# Patient Record
Sex: Female | Born: 1945 | Race: White | Hispanic: No | Smoking: Former smoker
Health system: Southern US, Community
[De-identification: ages and names within clinical notes are randomized; demographics above are authoritative.]

## PROBLEM LIST (undated history)

## (undated) DIAGNOSIS — C4490 Unspecified malignant neoplasm of skin, unspecified: Secondary | ICD-10-CM

## (undated) DIAGNOSIS — K5790 Diverticulosis of intestine, part unspecified, without perforation or abscess without bleeding: Secondary | ICD-10-CM

## (undated) DIAGNOSIS — R0602 Shortness of breath: Secondary | ICD-10-CM

## (undated) DIAGNOSIS — E039 Hypothyroidism, unspecified: Secondary | ICD-10-CM

## (undated) DIAGNOSIS — M1990 Unspecified osteoarthritis, unspecified site: Secondary | ICD-10-CM

## (undated) DIAGNOSIS — E785 Hyperlipidemia, unspecified: Secondary | ICD-10-CM

## (undated) DIAGNOSIS — F329 Major depressive disorder, single episode, unspecified: Secondary | ICD-10-CM

## (undated) DIAGNOSIS — K219 Gastro-esophageal reflux disease without esophagitis: Secondary | ICD-10-CM

## (undated) DIAGNOSIS — I10 Essential (primary) hypertension: Secondary | ICD-10-CM

## (undated) DIAGNOSIS — K635 Polyp of colon: Secondary | ICD-10-CM

## (undated) DIAGNOSIS — J40 Bronchitis, not specified as acute or chronic: Secondary | ICD-10-CM

## (undated) DIAGNOSIS — F32A Depression, unspecified: Secondary | ICD-10-CM

## (undated) DIAGNOSIS — Z8719 Personal history of other diseases of the digestive system: Secondary | ICD-10-CM

## (undated) DIAGNOSIS — J329 Chronic sinusitis, unspecified: Secondary | ICD-10-CM

## (undated) DIAGNOSIS — N189 Chronic kidney disease, unspecified: Secondary | ICD-10-CM

## (undated) HISTORY — PX: OTHER SURGICAL HISTORY: SHX169

## (undated) HISTORY — PX: HAMMER TOE SURGERY: SHX385

## (undated) HISTORY — PX: CATARACT EXTRACTION, BILATERAL: SHX1313

## (undated) HISTORY — PX: CARPOMETACARPAL (CMC) FUSION OF THUMB: SHX6290

## (undated) HISTORY — PX: HAND SURGERY: SHX662

## (undated) HISTORY — PX: NASAL SINUS SURGERY: SHX719

## (undated) HISTORY — DX: Gastro-esophageal reflux disease without esophagitis: K21.9

## (undated) HISTORY — DX: Hypothyroidism, unspecified: E03.9

## (undated) HISTORY — PX: POLYPECTOMY: SHX149

## (undated) HISTORY — DX: Major depressive disorder, single episode, unspecified: F32.9

## (undated) HISTORY — DX: Hyperlipidemia, unspecified: E78.5

## (undated) HISTORY — DX: Chronic kidney disease, unspecified: N18.9

## (undated) HISTORY — DX: Polyp of colon: K63.5

## (undated) HISTORY — DX: Essential (primary) hypertension: I10

## (undated) HISTORY — PX: CHOLECYSTECTOMY: SHX55

## (undated) HISTORY — DX: Unspecified malignant neoplasm of skin, unspecified: C44.90

## (undated) HISTORY — DX: Diverticulosis of intestine, part unspecified, without perforation or abscess without bleeding: K57.90

## (undated) HISTORY — DX: Bronchitis, not specified as acute or chronic: J40

## (undated) HISTORY — DX: Depression, unspecified: F32.A

## (undated) HISTORY — DX: Unspecified osteoarthritis, unspecified site: M19.90

## (undated) HISTORY — PX: ABDOMINAL HYSTERECTOMY: SHX81

---

## 1998-06-11 ENCOUNTER — Other Ambulatory Visit: Admission: RE | Admit: 1998-06-11 | Discharge: 1998-06-11 | Payer: Self-pay | Admitting: Internal Medicine

## 1998-06-30 ENCOUNTER — Encounter: Payer: Self-pay | Admitting: Cardiology

## 1998-06-30 ENCOUNTER — Ambulatory Visit (HOSPITAL_COMMUNITY): Admission: RE | Admit: 1998-06-30 | Discharge: 1998-06-30 | Payer: Self-pay | Admitting: Cardiology

## 1998-07-22 ENCOUNTER — Encounter: Payer: Self-pay | Admitting: Internal Medicine

## 1998-07-22 ENCOUNTER — Ambulatory Visit (HOSPITAL_COMMUNITY): Admission: RE | Admit: 1998-07-22 | Discharge: 1998-07-22 | Payer: Self-pay | Admitting: Internal Medicine

## 1999-03-13 ENCOUNTER — Ambulatory Visit (HOSPITAL_COMMUNITY): Admission: RE | Admit: 1999-03-13 | Discharge: 1999-03-13 | Payer: Self-pay | Admitting: Internal Medicine

## 1999-07-29 ENCOUNTER — Encounter (INDEPENDENT_AMBULATORY_CARE_PROVIDER_SITE_OTHER): Payer: Self-pay | Admitting: Specialist

## 1999-07-29 ENCOUNTER — Other Ambulatory Visit: Admission: RE | Admit: 1999-07-29 | Discharge: 1999-07-29 | Payer: Self-pay | Admitting: Otolaryngology

## 1999-10-27 ENCOUNTER — Other Ambulatory Visit: Admission: RE | Admit: 1999-10-27 | Discharge: 1999-10-27 | Payer: Self-pay | Admitting: Internal Medicine

## 2001-06-07 ENCOUNTER — Other Ambulatory Visit: Admission: RE | Admit: 2001-06-07 | Discharge: 2001-06-07 | Payer: Self-pay | Admitting: Internal Medicine

## 2005-02-10 ENCOUNTER — Ambulatory Visit: Payer: Self-pay | Admitting: Internal Medicine

## 2005-04-12 ENCOUNTER — Ambulatory Visit: Payer: Self-pay | Admitting: Internal Medicine

## 2006-06-02 ENCOUNTER — Other Ambulatory Visit: Admission: RE | Admit: 2006-06-02 | Discharge: 2006-06-02 | Payer: Self-pay | Admitting: Diagnostic Radiology

## 2006-06-02 ENCOUNTER — Encounter: Admission: RE | Admit: 2006-06-02 | Discharge: 2006-06-02 | Payer: Self-pay | Admitting: Internal Medicine

## 2006-06-02 ENCOUNTER — Encounter (INDEPENDENT_AMBULATORY_CARE_PROVIDER_SITE_OTHER): Payer: Self-pay | Admitting: Specialist

## 2006-06-02 IMAGING — US US BIOPSY
1 series · 5 of 5 positions shown · non-contrast
Comparison: none

CLINICAL DATA: Dominant left thyroid nodule .

[Series 1: unknown · 0.07mm/px · 5 of 5 slices shown]
[im 1/5]
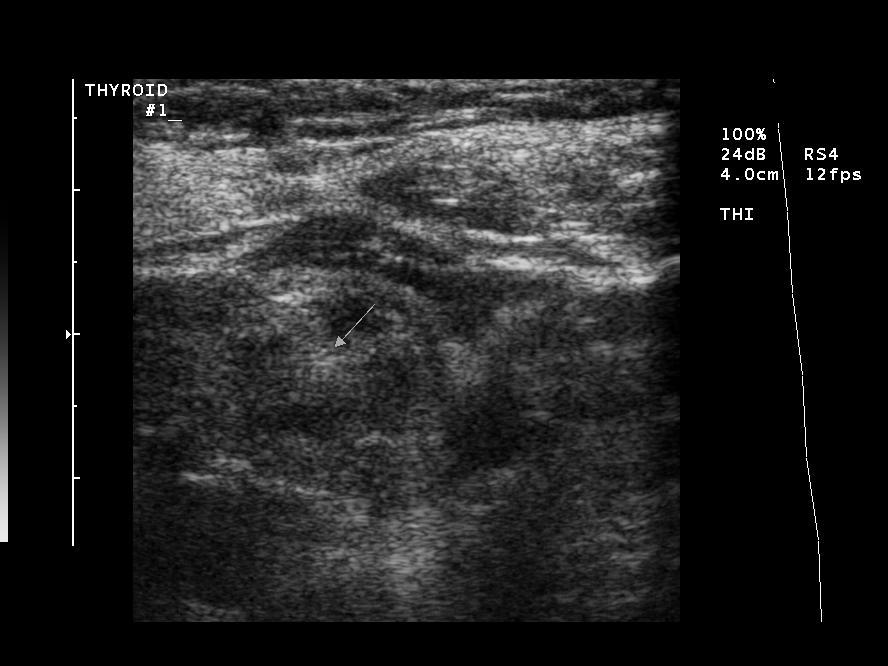
[im 2/5]
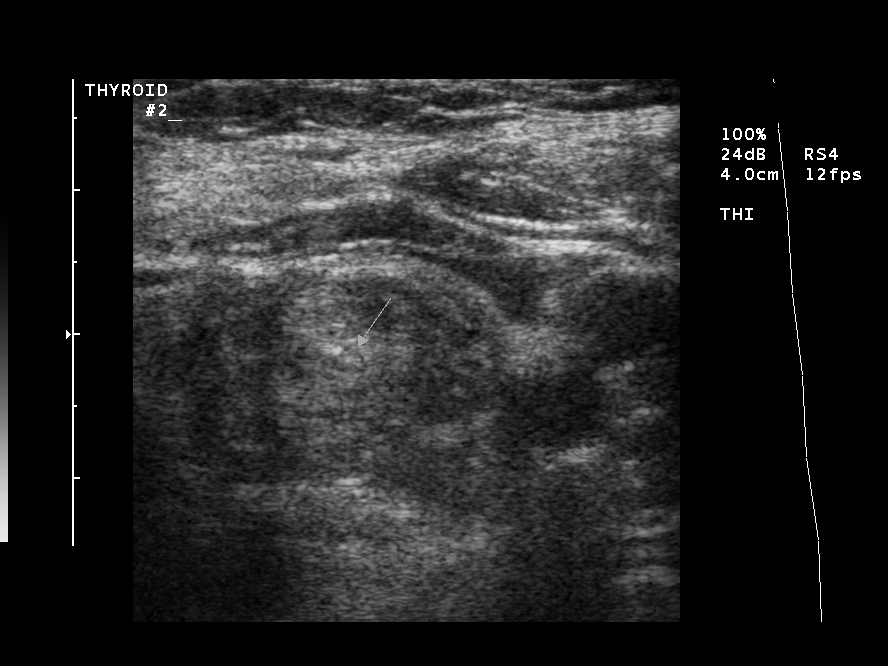
[im 3/5]
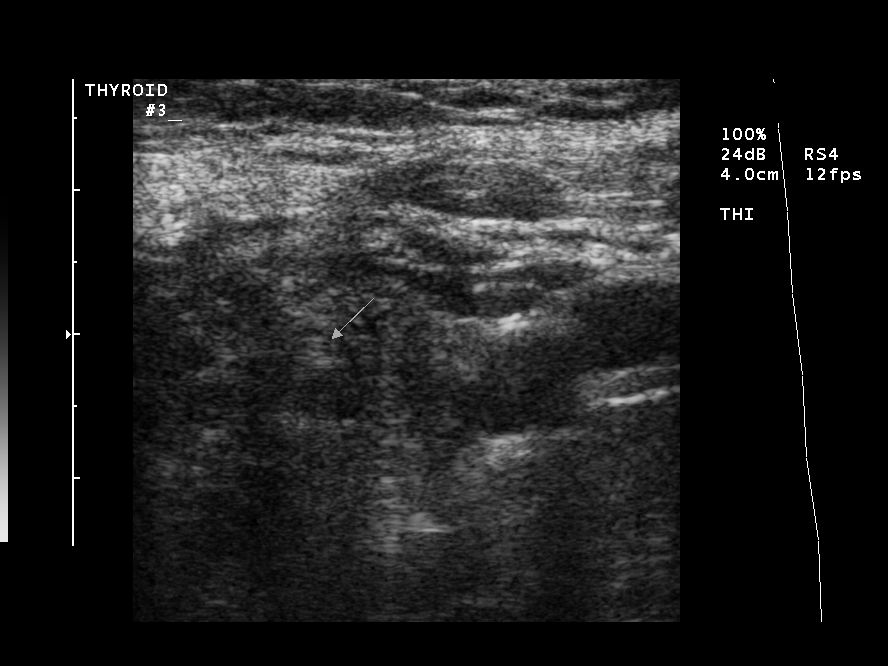
[im 4/5]
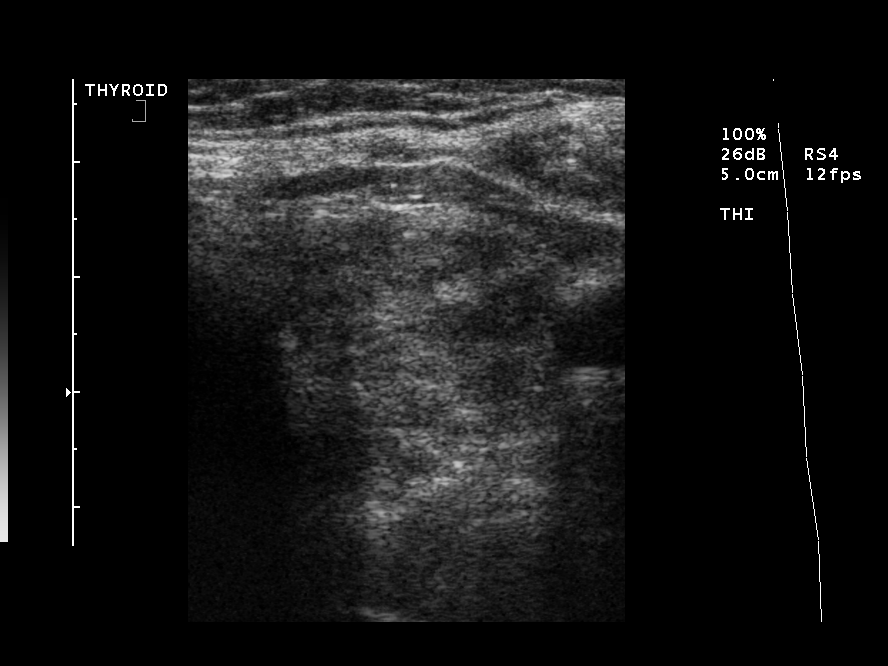
[im 5/5]
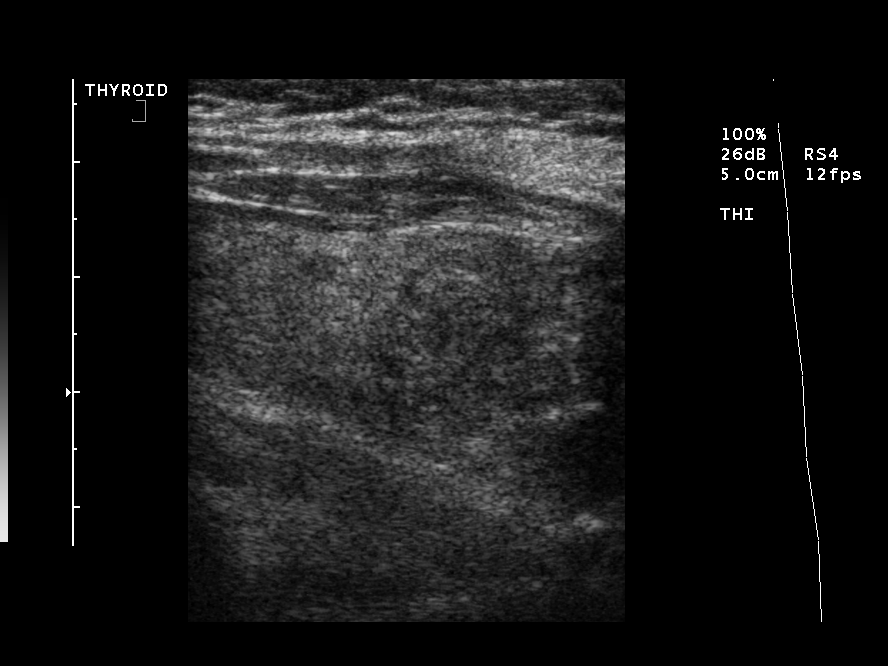

[5 of 5 positions shown; findings below may reference images not displayed]

ULTRASOUND-GUIDED LEFT THYROID BIOPSY:

The findings: Written informed consent was obtained the patient for the
procedure. The patient was placed supine on the ultrasound table. Limited
ultrasound performed to the thyroid to localize the thyroid nodule to be
biopsied. The neck was prepped and draped in sterile fashion and anesthetized
with 1% lidocaine. Using ultrasound guidance, 3 separate 25 gauge fine needle
aspirations were obtained within the dominant left inferior thyroid nodule . The
specimens were given to the technologist for further processing.

The patient tolerated the procedure well.
IMPRESSION: Successful ultrasound-guided biopsy of left thyroid nodule.

## 2007-05-21 ENCOUNTER — Emergency Department (HOSPITAL_COMMUNITY): Admission: EM | Admit: 2007-05-21 | Discharge: 2007-05-22 | Payer: Self-pay | Admitting: Emergency Medicine

## 2007-05-21 IMAGING — CR DG CHEST 1V PORT
1 series · 1 of 1 positions shown · non-contrast
Comparison: None.

CLINICAL DATA: Shortness of breath.  
 PORTABLE CHEST - 1 VIEW:

[view not recorded]
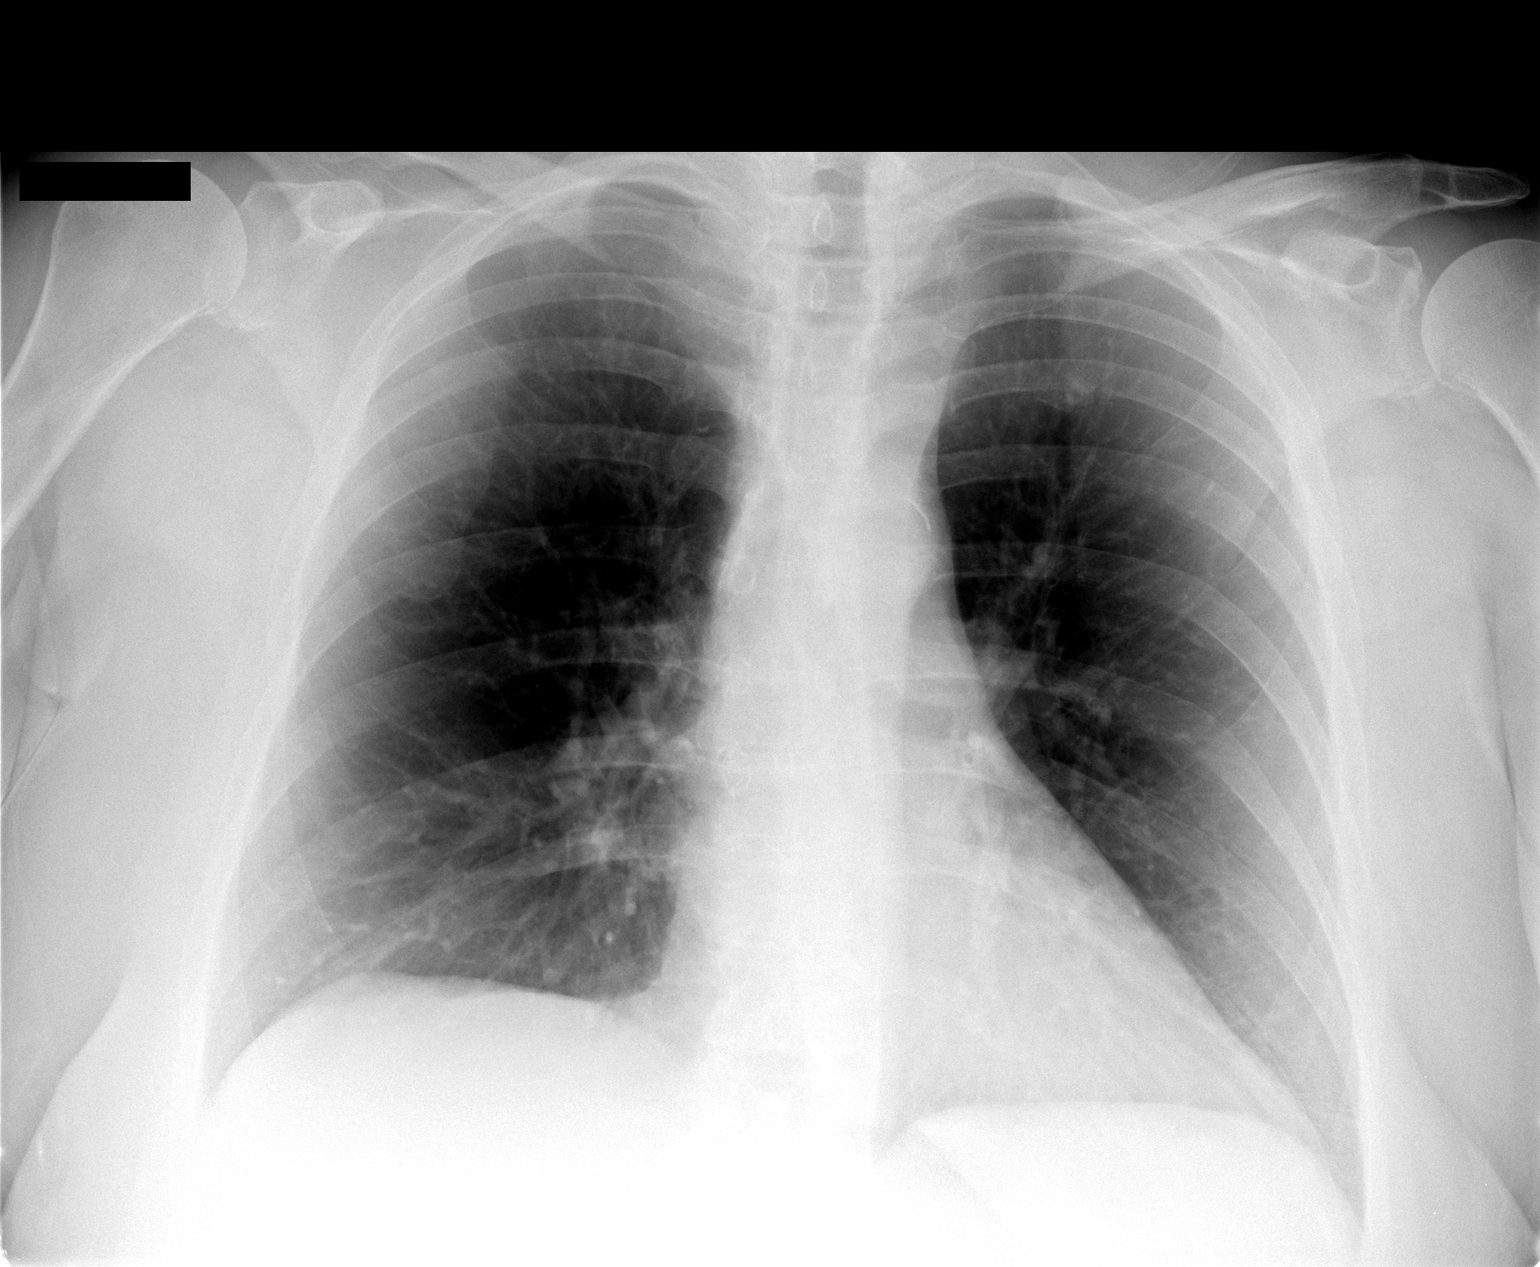

[1 of 1 positions shown; findings below may reference images not displayed]

FINDINGS: Heart size normal.  
 No pleural fluid or pulmonary edema. 
 No air space opacities are identified.
IMPRESSION: No active disease.

## 2007-12-20 ENCOUNTER — Encounter: Payer: Self-pay | Admitting: Gastroenterology

## 2007-12-20 ENCOUNTER — Telehealth: Payer: Self-pay | Admitting: Internal Medicine

## 2007-12-20 ENCOUNTER — Ambulatory Visit: Payer: Self-pay | Admitting: Internal Medicine

## 2008-01-02 ENCOUNTER — Encounter: Payer: Self-pay | Admitting: Internal Medicine

## 2008-01-02 ENCOUNTER — Ambulatory Visit: Payer: Self-pay | Admitting: Internal Medicine

## 2008-01-04 ENCOUNTER — Encounter: Payer: Self-pay | Admitting: Internal Medicine

## 2009-09-23 DIAGNOSIS — K219 Gastro-esophageal reflux disease without esophagitis: Secondary | ICD-10-CM | POA: Insufficient documentation

## 2010-07-23 ENCOUNTER — Ambulatory Visit (HOSPITAL_COMMUNITY)
Admission: RE | Admit: 2010-07-23 | Discharge: 2010-07-23 | Payer: Self-pay | Source: Home / Self Care | Attending: Orthopedic Surgery | Admitting: Orthopedic Surgery

## 2010-07-23 IMAGING — RF DG FLUORO GUIDE NDL PLC/BX
3 series · 3 of 3 positions shown · non-contrast
Comparison: None

CLINICAL DATA: Diagnostic hip injection

FLUORO GUIDED NEEDLE PLACEMENT

[Series 1: run · 1 of 1 slices shown (1 of 3)]
[im 1/1]
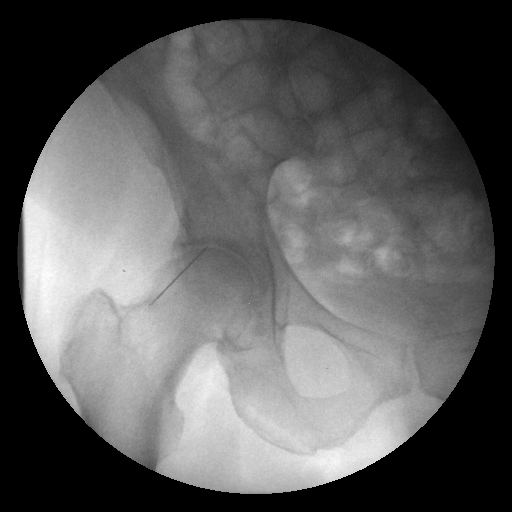

[Series 2: run · 1 of 1 slices shown (2 of 3)]
[im 1/1]
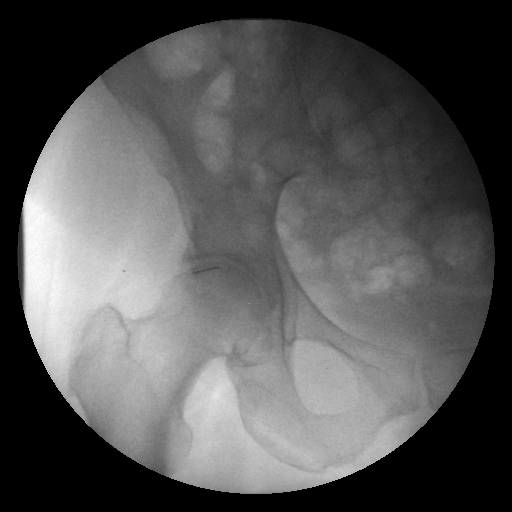

[Series 3: run · 1 of 1 slices shown (3 of 3)]
[im 1/1]
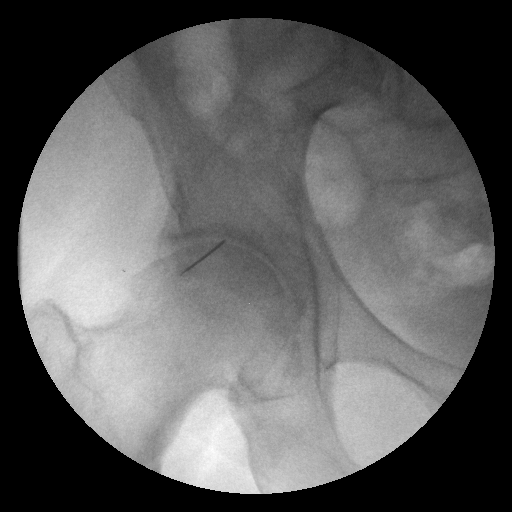

[3 of 3 positions shown; findings below may reference images not displayed]

FINDINGS: Three fluoroscopic spot images demonstrate a needle
positioned over the right hip joint.
IMPRESSION: Fluoroscopic guided right hip joint injection.

## 2010-12-11 ENCOUNTER — Other Ambulatory Visit: Payer: Self-pay | Admitting: Internal Medicine

## 2010-12-11 DIAGNOSIS — E049 Nontoxic goiter, unspecified: Secondary | ICD-10-CM

## 2010-12-15 ENCOUNTER — Other Ambulatory Visit: Payer: Self-pay

## 2010-12-16 ENCOUNTER — Ambulatory Visit
Admission: RE | Admit: 2010-12-16 | Discharge: 2010-12-16 | Disposition: A | Discharge status: Home / Self Care | Payer: Medicare Other | Source: Ambulatory Visit | Attending: Internal Medicine | Admitting: Internal Medicine

## 2010-12-16 DIAGNOSIS — E049 Nontoxic goiter, unspecified: Secondary | ICD-10-CM

## 2010-12-16 IMAGING — US US SOFT TISSUE HEAD/NECK
1 series · 13 of 25 positions shown · non-contrast
Comparison: Dictated report from ultrasound from [REDACTED] dated [DATE]

CLINICAL DATA: Follow up of thyroid goiter, the patient is on
Synthroid, history of prior thyroid biopsy in [85]

THYROID ULTRASOUND
TECHNIQUE: Ultrasound examination of the thyroid gland and adjacent
soft tissues was performed.

[Series 1: us soft tissue head/neck · 0.09mm/px · 13 of 78 slices shown]
[im 1/78]
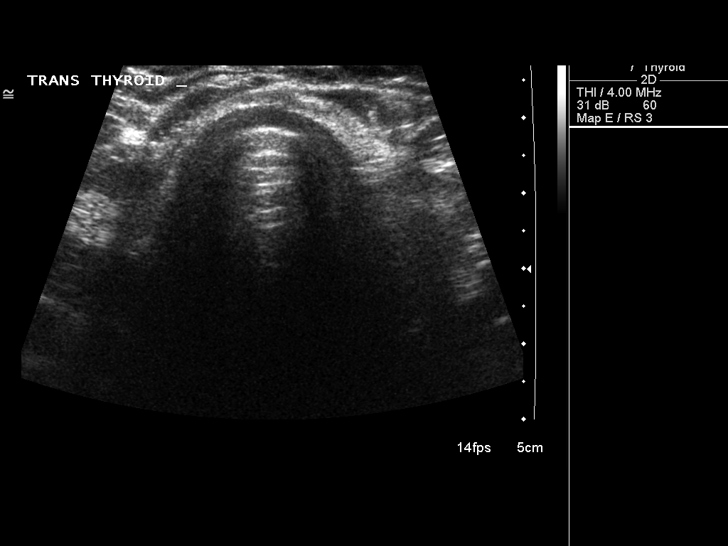
[im 7/78]
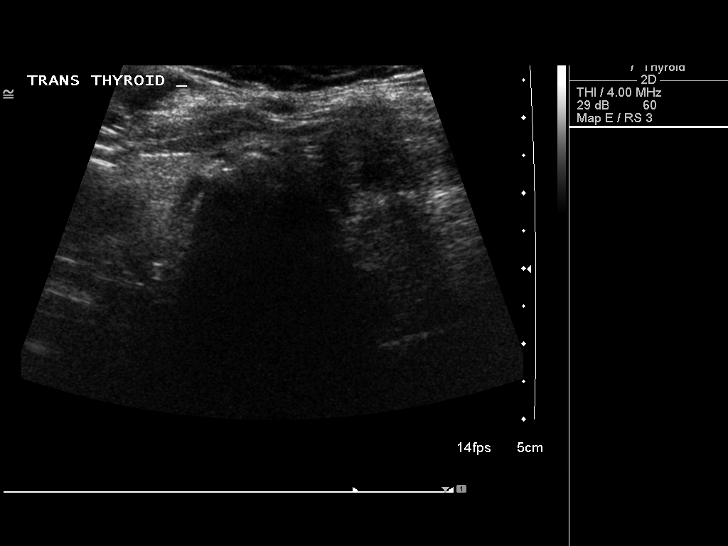
[im 13/78]
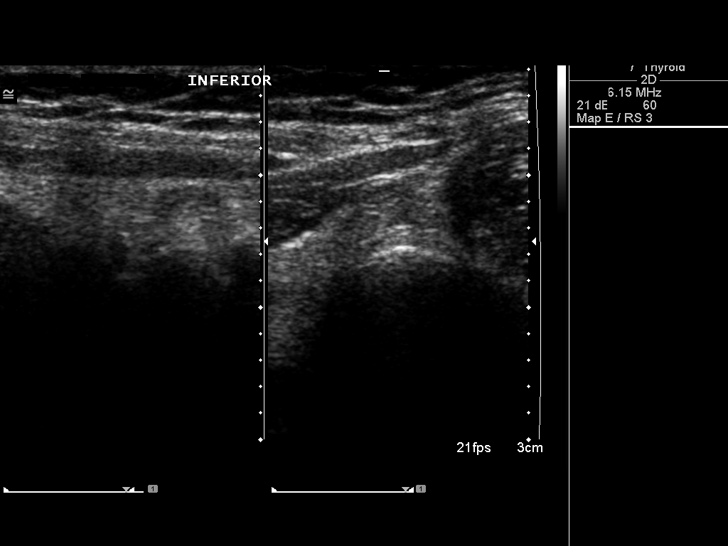
[im 20/78]
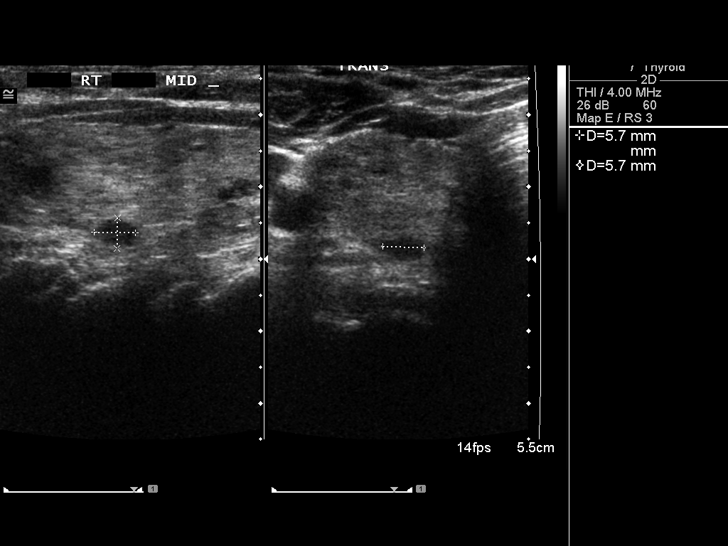
[im 26/78]
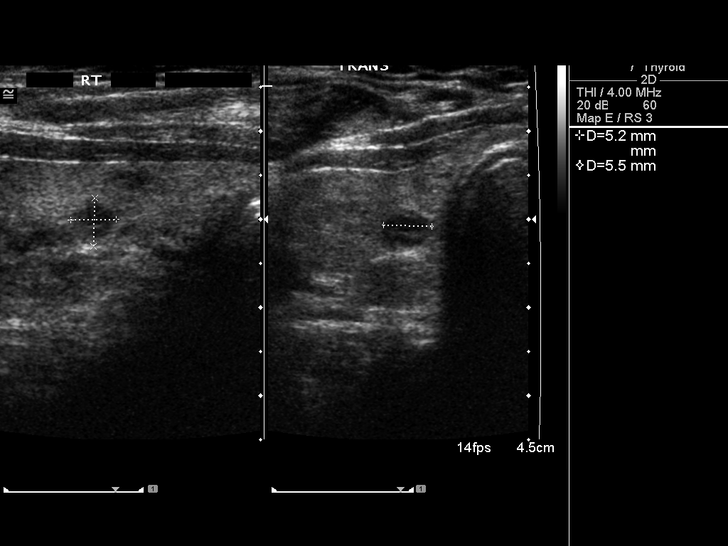
[im 33/78]
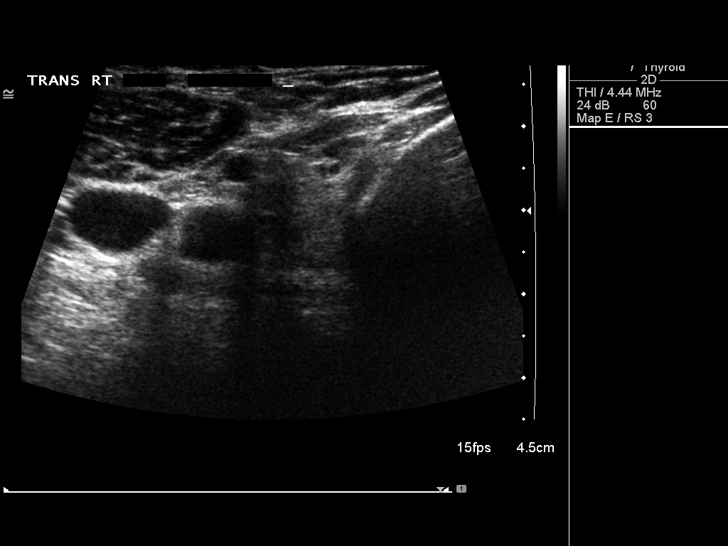
[im 39/78]
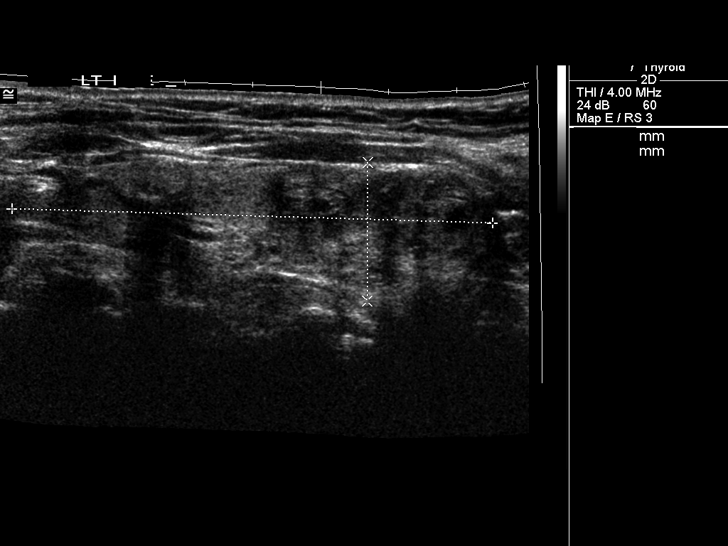
[im 45/78]
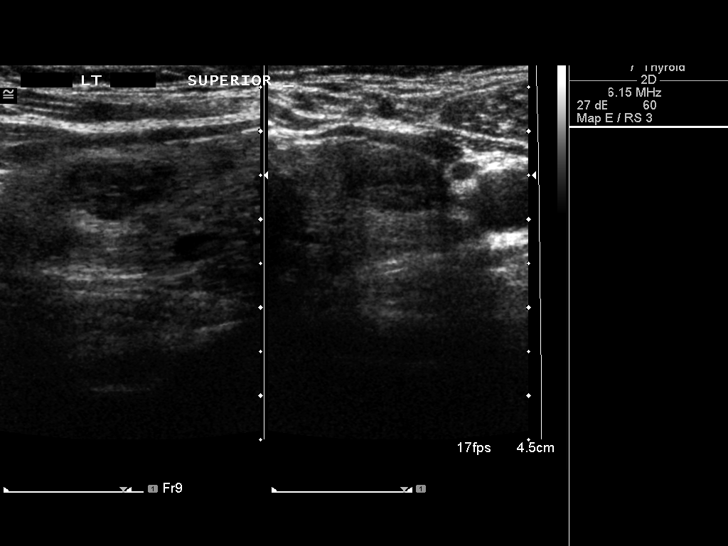
[im 52/78]
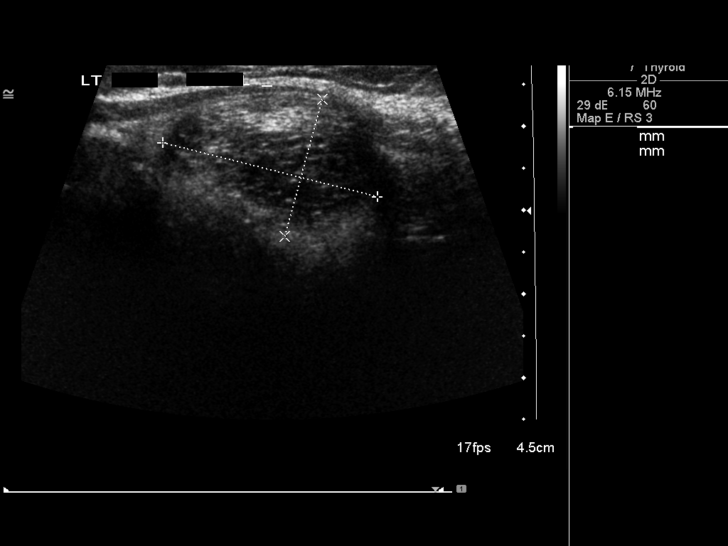
[im 58/78]
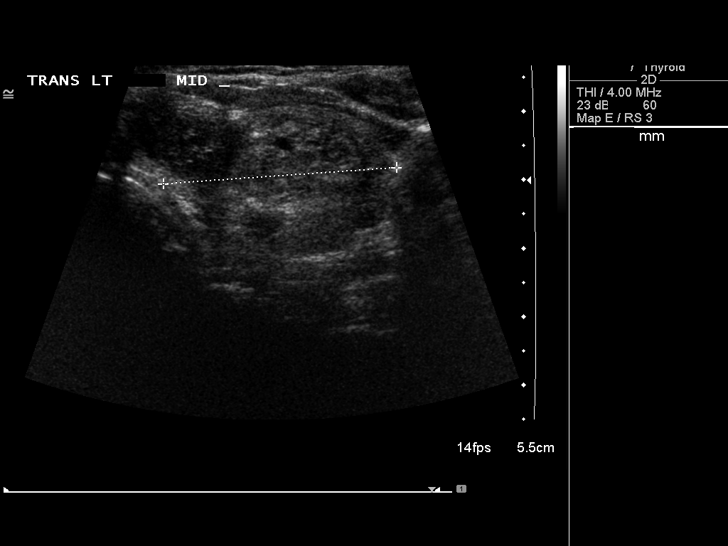
[im 65/78]
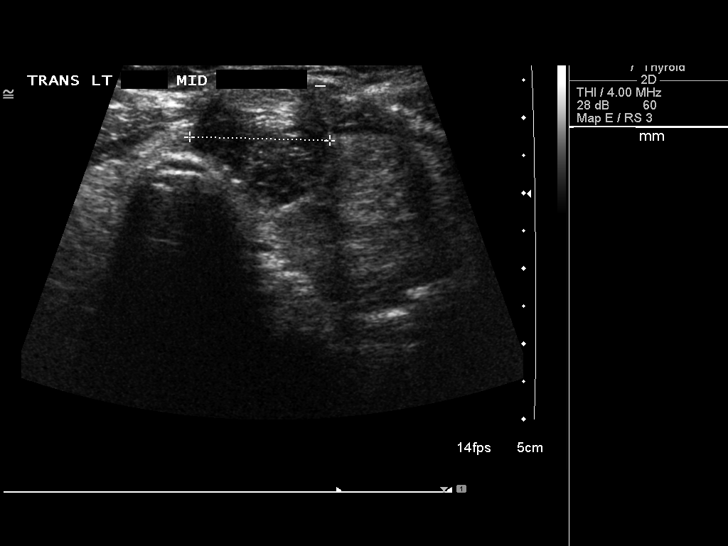
[im 71/78]
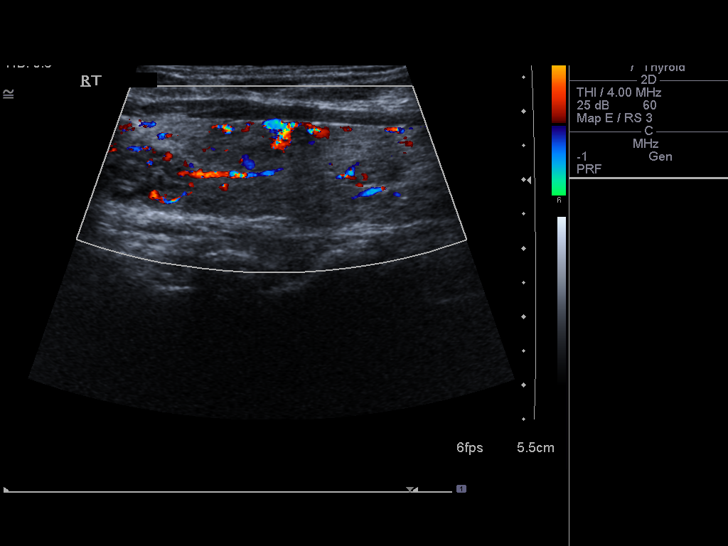
[im 78/78]
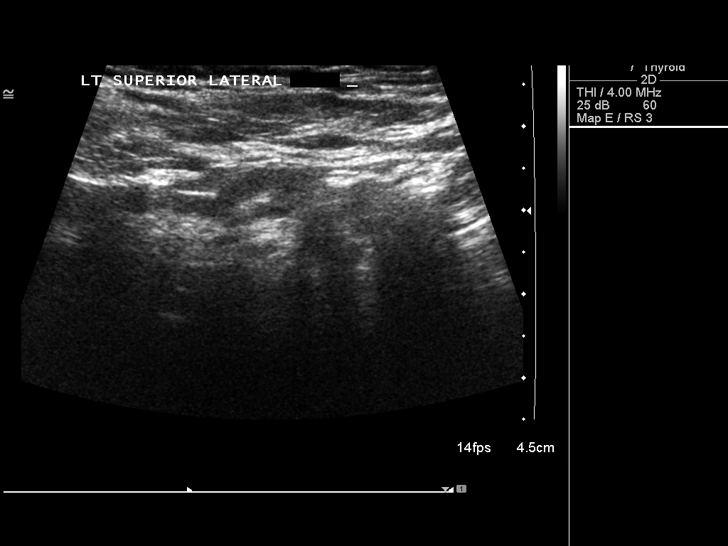

[13 of 25 positions shown; findings below may reference images not displayed]

FINDINGS: Right thyroid lobe:  6.6 x 1.6 x 2.1 cm.  (Previously 5.2 x 1.6 x
2.2 cm).
Left thyroid lobe:  7.1 x 2.0 x 3.4 cm.  (Previously 6.5 x 2.1 x
2.1 cm)
Isthmus:  It 4 mm compared to 6 mm previously.

Focal nodules:  There are nodules bilaterally.  The dominant solid
nodule is in the lower pole of the left lobe measuring 3.0 x 2.8 x
3.3 cm with calcifications.  Previously this nodule measured 3.0 x
2.6 x 3.0 cm.  A  solid nodule superior to this dominant nodule and
more medially positioned measures 2.6 x 1.7 x 1.9 cm compared to
prior measurements of 1.9 x 1.2 x 1.4 cm.The largest solid nodule
on the right is in the upper pole measuring 1.6 x 1.0 x 1.1 cm
compared to prior measurements of 1.5 x 0.8 x 1.0 cm.  Multiple
smaller nodules are scattered of 25 and 9 mm in diameter
bilaterally.

Lymphadenopathy:  None visualized.
IMPRESSION: No significant change in multiple thyroid nodules with the dominant
nodule in the lower pole on the left remaining stable.  A nodule
slightly above and medial to the solid nodule may be slightly
larger as described above.

## 2011-02-09 ENCOUNTER — Ambulatory Visit (INDEPENDENT_AMBULATORY_CARE_PROVIDER_SITE_OTHER): Payer: Medicare Other | Admitting: Internal Medicine

## 2011-02-09 ENCOUNTER — Encounter: Payer: Self-pay | Admitting: Internal Medicine

## 2011-02-09 DIAGNOSIS — K5900 Constipation, unspecified: Secondary | ICD-10-CM

## 2011-02-09 DIAGNOSIS — R195 Other fecal abnormalities: Secondary | ICD-10-CM

## 2011-02-09 DIAGNOSIS — Z860100 Personal history of colon polyps, unspecified: Secondary | ICD-10-CM

## 2011-02-09 DIAGNOSIS — K219 Gastro-esophageal reflux disease without esophagitis: Secondary | ICD-10-CM

## 2011-02-09 DIAGNOSIS — K625 Hemorrhage of anus and rectum: Secondary | ICD-10-CM

## 2011-02-09 MED ORDER — PEG-KCL-NACL-NASULF-NA ASC-C 100 G PO SOLR: 1.0000 | Freq: Once | ORAL | Status: DC

## 2011-02-09 NOTE — Progress Notes (Signed)
HISTORY OF PRESENT ILLNESS:  Cassidy Garcia is a 65 y.o. female with the below listed medical history who presents today regarding Hemoccult-positive stool. The patient underwent routine screening colonoscopy, to evaluate hematochezia, February 2004. This was normal except for mild diverticulosis and internal hemorrhoids. She underwent repeat colonoscopy in May of 2009 for routine screening purposes. She was again found to have mild diverticulosis and internal hemorrhoids. As well, a diminutive colon polyp which was removed from the ascending colon and found to be adenomatous. Followup in 5 years recommend. Patient tells me that she was undergoing her routine annual comprehensive evaluation this spring. As part of that, Hemoccult testing was obtained. Review of outside records shows positive Hemosure on 12/14/2010. She tells me that her blood counts are normal. GI review of systems is remarkable for constipation, for which she takes MiraLax. Occasional anal discomfort related to hemorrhoids. Occasional red blood per rectum attributed to hemorrhoids. She also has a history of reflux for which she takes Prilosec. This controls classic symptoms. No dysphagia. She uses meloxicam for arthritic pain. REVIEW OF SYSTEMS:  All non-GI ROS negative except for arthritis, back pain, and muscle pains.  Past Medical History  Diagnosis Date  . Diverticulosis   . Hemorrhoids   . Hyperlipidemia   . GERD (gastroesophageal reflux disease)   . Hypertension   . Colon polyps     tubular adenoma  . Skin cancer   . Hypothyroidism   . Asthma   . Depression     Past Surgical History  Procedure Date  . Nasal sinus surgery   . Abdominal hysterectomy   . Cholecystectomy     Social History Cassidy Garcia  reports that she has quit smoking. She does not have any smokeless tobacco history on file. She reports that she does not drink alcohol or use illicit drugs.  family history includes Ovarian cancer in her mother.   There is no history of Colon cancer.  Allergies  Allergen Reactions  . Codeine     REACTION: Lightheaded, nauses       PHYSICAL EXAMINATION: Vital signs: BP 122/82  Pulse 88  Ht 5\' 5"  (1.651 m)  Wt 193 lb 3.4 oz (87.639 kg)  BMI 32.15 kg/m2  Constitutional: generally well-appearing, no acute distress Psychiatric: alert and oriented x3, cooperative Eyes: extraocular movements intact, anicteric, conjunctiva pink Mouth: oral pharynx moist, no lesions Neck: supple no lymphadenopathy Cardiovascular: heart regular rate and rhythm, no murmur Lungs: clear to auscultation bilaterally Abdomen: soft, nontender, nondistended, no obvious ascites, no peritoneal signs, normal bowel sounds, no organomegaly Rectal: Deferred until colonoscopy Extremities: no lower extremity edema bilaterally Skin: no lesions on visible extremities Neuro: No focal deficits.   ASSESSMENT:  #1. Hemoccult-positive stool #2. Personal history of adenomatous colon polyps #3. Chronic constipation #4. GERD without alarm features   PLAN:  #1. Colonoscopy.The nature of the procedure, as well as the risks, benefits, and alternatives were carefully and thoroughly reviewed with the patient. Ample time for discussion and questions allowed. The patient understood, was satisfied, and agreed to proceed. Movi prep prescribed. The patient instructed on its use #2. Continue MiraLax as needed for constipation #3. Continue PPI to control GERD symptoms #4. Reflux precautions #5. Obtain outside CBC to make sure that the patient is not anemic. If she is anemic,  upper endoscopy may be indicated.

## 2011-02-09 NOTE — Patient Instructions (Signed)
Colon [facility] 02/12/11 11:30 am arrive at 10:30 am on 4th floor Moviprep sent to pharmacy Colonoscopy brochure given for you to review.

## 2011-02-12 ENCOUNTER — Ambulatory Visit (AMBULATORY_SURGERY_CENTER): Payer: Medicare Other | Admitting: Internal Medicine

## 2011-02-12 ENCOUNTER — Encounter: Payer: Self-pay | Admitting: Internal Medicine

## 2011-02-12 VITALS — BP 128/76 | HR 88 | Temp 98.6°F | Resp 19 | Ht 65.0 in | Wt 193.0 lb

## 2011-02-12 DIAGNOSIS — R195 Other fecal abnormalities: Secondary | ICD-10-CM

## 2011-02-12 DIAGNOSIS — Z860100 Personal history of colon polyps, unspecified: Secondary | ICD-10-CM

## 2011-02-12 DIAGNOSIS — Z1211 Encounter for screening for malignant neoplasm of colon: Secondary | ICD-10-CM

## 2011-02-12 HISTORY — PX: COLONOSCOPY: SHX174

## 2011-02-12 MED ORDER — SODIUM CHLORIDE 0.9 % IV SOLN: 500.0000 mL | INTRAVENOUS | Status: DC

## 2011-02-12 NOTE — Patient Instructions (Signed)
DISCHARGE INSTRUCTIONS GIVEN WITH VERBAL UNDERSTANDING. NORMAL EXAMINATION. RESUME PREVIOUS MEDICATIONS.

## 2011-02-12 NOTE — Progress Notes (Signed)
Pt tolerated the exam very well.MAW

## 2011-02-15 ENCOUNTER — Telehealth: Payer: Self-pay

## 2011-02-15 NOTE — Telephone Encounter (Signed)

## 2011-06-28 ENCOUNTER — Institutional Professional Consult (permissible substitution): Payer: Medicare Other | Admitting: Cardiology

## 2011-07-02 ENCOUNTER — Encounter: Payer: Self-pay | Admitting: *Deleted

## 2011-07-06 ENCOUNTER — Ambulatory Visit (INDEPENDENT_AMBULATORY_CARE_PROVIDER_SITE_OTHER): Payer: Medicare Other | Admitting: Cardiology

## 2011-07-06 DIAGNOSIS — R079 Chest pain, unspecified: Secondary | ICD-10-CM | POA: Insufficient documentation

## 2011-07-06 DIAGNOSIS — I10 Essential (primary) hypertension: Secondary | ICD-10-CM

## 2011-07-06 DIAGNOSIS — E785 Hyperlipidemia, unspecified: Secondary | ICD-10-CM

## 2011-07-06 NOTE — Patient Instructions (Addendum)
   Call if you have any chest pain in the meantime.

## 2011-07-06 NOTE — Progress Notes (Signed)
[name] Date of Birth: Dec 28, 1945 Medical Record [id number]  History of Present Illness: Cassidy Garcia is seen today at the request of Dr. Reynaldo Minium for evaluation of chest pain. She is a pleasant 65 year old white female who has a history of hypertension and hyperlipidemia. She was evaluated here in October of 2009 with symptoms of dyspnea on exertion. She had a normal stress Myoview study at that time. She also had a normal nuclear stress test at the hospital in 1999. She reports an episode in August of chest tightness associated with nausea and dizziness. [place] and felt that she was dehydrated. She developed the symptoms walking uphill. Since that time she has had no recurrence of the symptoms. She does exert to at least a moderate degree fairly often without any symptoms.  Current Outpatient Prescriptions on File Prior to Visit  Medication Sig Dispense Refill  . albuterol (VENTOLIN HFA) 108 (90 BASE) MCG/ACT inhaler Inhale 2 puffs into the lungs every 6 (six) hours as needed.        Marland Kitchen amitriptyline (ELAVIL) 25 MG tablet       . aspirin 81 MG tablet Take 81 mg by mouth daily.        . Calcium Carb-Cholecalciferol 600-500 MG-UNIT CAPS Take 2 capsules by mouth daily.        . Fluticasone-Salmeterol (ADVAIR DISKUS) 250-50 MCG/DOSE AEPB Inhale 1 puff into the lungs every 12 (twelve) hours.        Marland Kitchen glucosamine-chondroitin 500-400 MG tablet Take 1 tablet by mouth 2 (two) times daily.        Marland Kitchen levothyroxine (SYNTHROID, LEVOTHROID) 50 MCG tablet Take 50 mcg by mouth daily.        Marland Kitchen losartan-hydrochlorothiazide (HYZAAR) 100-25 MG per tablet Take 1 tablet by mouth Daily.      . meloxicam (MOBIC) 7.5 MG tablet Take 7.5 mg by mouth daily.        . mometasone (NASONEX) 50 MCG/ACT nasal spray Place 2 sprays into the nose daily.        . Multiple Vitamin (MULTIVITAMIN PO) Take 1 tablet by mouth daily.        . Omega-3 Fatty Acids (FISH OIL) 1200 MG  CAPS Take 1 capsule by mouth 2 (two) times daily.        Marland Kitchen omeprazole (PRILOSEC) 20 MG capsule Take 20 mg by mouth daily.        . simvastatin (ZOCOR) 20 MG tablet       . traMADol (ULTRAM) 50 MG tablet Take 50 mg by mouth every 6 (six) hours as needed.        . verapamil (CALAN-SR) 240 MG CR tablet Take 240 mg by mouth at bedtime.         Current Facility-Administered Medications on File Prior to Visit  Medication Dose Route Frequency Provider Last Rate Last Dose  . DISCONTD: 0.9 %  sodium chloride infusion  500 mL Intravenous Continuous Scarlette Shorts, MD        Allergies  Allergen Reactions  . Codeine     REACTION: Lightheaded, nauses    Past Medical History  Diagnosis Date  . Diverticulosis   . Hemorrhoids   . Hyperlipidemia   . GERD (gastroesophageal reflux disease)   . Hypertension   . Colon polyps     tubular adenoma  . Skin cancer   . Hypothyroidism   . Asthma   . Depression   .  Arthritis     Past Surgical History  Procedure Date  . Nasal sinus surgery   . Abdominal hysterectomy   . Cholecystectomy   . Colonoscopy 02/12/2011  . Polypectomy   . Hand surgery   . Feet surgery     History  Smoking status  . Former Smoker  Smokeless tobacco  . Never Used    History  Alcohol Use No    Family History  Problem Relation Age of Onset  . Ovarian cancer Mother   . Colon cancer Neg Hx     Review of Systems: As noted in history of present illness..  All other systems were reviewed and are negative.  Physical Exam: BP 135/75  Pulse 90  Ht 5\' 5"  (1.651 m)  Wt 198 lb (89.812 kg)  BMI 32.95 kg/m2 She is an obese white female in no acute distress.The patient is alert and oriented x 3.  The mood and affect are normal.  The skin is warm and dry.  Color is normal.  The HEENT exam reveals that the sclera are nonicteric.  The mucous membranes are moist.  The carotids are 2+ without bruits.  There is no thyromegaly.  There is no JVD.  The lungs are clear.  The chest  wall is non tender.  The heart exam reveals a regular rate with a normal S1 and S2.  There are no murmurs, gallops, or rubs.  The PMI is not displaced.   Abdominal exam reveals good bowel sounds.  There is no guarding or rebound.  There is no hepatosplenomegaly or tenderness.  There are no masses.  Exam of the legs reveal no clubbing, cyanosis, or edema.  The legs are without rashes.  The distal pulses are intact.  Cranial nerves II - XII are intact.  Motor and sensory functions are intact.  The gait is normal.  LABORATORY DATA: Recent ECG in April 2012 shows normal sinus rhythm with diffuse nonspecific T-wave abnormality. This is unchanged from 2009.  Assessment / Plan:

## 2011-07-06 NOTE — Assessment & Plan Note (Signed)
She experienced one episode of chest tightness in conditions that were more stressful than usual. She has had no recurrent symptoms over the past 3 months. She's had a normal nuclear stress test in October of 2009. After further discussion we elected to take a wait and see approach. If she has recurrent symptoms I would recommend a followup nuclear stress test. Otherwise I would continue on her current risk factor modification.

## 2011-09-23 DIAGNOSIS — H52209 Unspecified astigmatism, unspecified eye: Secondary | ICD-10-CM | POA: Diagnosis not present

## 2011-09-23 DIAGNOSIS — H2510 Age-related nuclear cataract, unspecified eye: Secondary | ICD-10-CM | POA: Diagnosis not present

## 2011-12-03 ENCOUNTER — Other Ambulatory Visit (HOSPITAL_COMMUNITY): Payer: Self-pay | Admitting: Orthopaedic Surgery

## 2011-12-03 DIAGNOSIS — M169 Osteoarthritis of hip, unspecified: Secondary | ICD-10-CM | POA: Diagnosis not present

## 2011-12-06 ENCOUNTER — Encounter (HOSPITAL_COMMUNITY): Payer: Self-pay | Admitting: Pharmacy Technician

## 2011-12-07 ENCOUNTER — Encounter (HOSPITAL_COMMUNITY)
Admission: RE | Admit: 2011-12-07 | Discharge: 2011-12-07 | Disposition: A | Discharge status: Home / Self Care | Payer: Medicare Other | Source: Ambulatory Visit | Attending: Orthopaedic Surgery | Admitting: Orthopaedic Surgery

## 2011-12-07 ENCOUNTER — Encounter (HOSPITAL_COMMUNITY): Payer: Self-pay

## 2011-12-07 HISTORY — DX: Shortness of breath: R06.02

## 2011-12-07 LAB — URINALYSIS, ROUTINE W REFLEX MICROSCOPIC
Nitrite: NEGATIVE
Protein, ur: NEGATIVE mg/dL
Specific Gravity, Urine: 1.02 (ref 1.005–1.030)
Urobilinogen, UA: 1 mg/dL (ref 0.0–1.0)

## 2011-12-07 LAB — URINE MICROSCOPIC-ADD ON

## 2011-12-07 LAB — CBC
Hemoglobin: 14.4 g/dL (ref 12.0–15.0)
MCHC: 34.6 g/dL (ref 30.0–36.0)
RDW: 12.7 % (ref 11.5–15.5)
WBC: 7.7 10*3/uL (ref 4.0–10.5)

## 2011-12-07 LAB — PROTIME-INR
INR: 0.96 (ref 0.00–1.49)
Prothrombin Time: 13 seconds (ref 11.6–15.2)

## 2011-12-07 LAB — BASIC METABOLIC PANEL
BUN: 34 mg/dL — ABNORMAL HIGH (ref 6–23)
Chloride: 101 mEq/L (ref 96–112)
Creatinine, Ser: 1.45 mg/dL — ABNORMAL HIGH (ref 0.50–1.10)
GFR calc Af Amer: 42 mL/min — ABNORMAL LOW (ref >90)
GFR calc non Af Amer: 37 mL/min — ABNORMAL LOW (ref >90)
Potassium: 4.3 mEq/L (ref 3.5–5.1)

## 2011-12-07 NOTE — Patient Instructions (Signed)
[place]  12/07/2011   Your procedure is scheduled on:  12/10/11 1230pm-232pm  Report to [facility] at 1000 AM.  Call this number if you have problems the morning of surgery: [phone]   Remember:   Do not eat food:After Midnight.  May have clear liquids:until Midnight .   Take these medicines the morning of surgery with A SIP OF WATER:    Do not wear jewelry,  Do not wear lotions, powders, or perfumes.     Do not bring valuables to the hospital.  Contacts, dentures or bridgework may not be worn into surgery.  Leave suitcase in the car. After surgery it may be brought to your room.  For patients admitted to the hospital, checkout time is 11:00 AM the day of discharge.     Special Instructions: CHG Shower Use Special Wash: 1/2 bottle night before surgery and 1/2 bottle morning of surgery. shower chin to toes with CHG.  Wash face and private parts with regular soap.    Please read over the following fact sheets that you were given: MRSA Information, coughing and deep breathing exercises, leg exercises

## 2011-12-07 NOTE — Pre-Procedure Instructions (Signed)
12/07/11 Spoke with Triage at Dr Ninfa Linden office regarding abnormal lab results of BUn- 34 and Creatinine- 1.45 and abnormal urinalysis .  Triage to notify Dr Jean Rosenthal.

## 2011-12-10 ENCOUNTER — Encounter (HOSPITAL_COMMUNITY): Payer: Self-pay | Admitting: *Deleted

## 2011-12-10 ENCOUNTER — Ambulatory Visit (HOSPITAL_COMMUNITY): Payer: Medicare Other

## 2011-12-10 ENCOUNTER — Encounter (HOSPITAL_COMMUNITY): Payer: Self-pay | Admitting: Anesthesiology

## 2011-12-10 ENCOUNTER — Encounter (HOSPITAL_COMMUNITY)
Admission: RE | Disposition: A | Discharge status: Home Health | Payer: Self-pay | Source: Ambulatory Visit | Attending: Orthopaedic Surgery

## 2011-12-10 ENCOUNTER — Inpatient Hospital Stay (HOSPITAL_COMMUNITY)
Admission: RE | Admit: 2011-12-10 | Discharge: 2011-12-13 | DRG: 470 | Disposition: A | Discharge status: Home Health | Payer: Medicare Other | Source: Ambulatory Visit | Attending: Orthopaedic Surgery | Admitting: Orthopaedic Surgery

## 2011-12-10 ENCOUNTER — Ambulatory Visit (HOSPITAL_COMMUNITY): Payer: Medicare Other | Admitting: Anesthesiology

## 2011-12-10 ENCOUNTER — Inpatient Hospital Stay (HOSPITAL_COMMUNITY): Payer: Medicare Other

## 2011-12-10 DIAGNOSIS — M169 Osteoarthritis of hip, unspecified: Secondary | ICD-10-CM | POA: Diagnosis present

## 2011-12-10 DIAGNOSIS — D62 Acute posthemorrhagic anemia: Secondary | ICD-10-CM | POA: Diagnosis not present

## 2011-12-10 DIAGNOSIS — M1610 Unilateral primary osteoarthritis, unspecified hip: Principal | ICD-10-CM | POA: Diagnosis present

## 2011-12-10 DIAGNOSIS — E039 Hypothyroidism, unspecified: Secondary | ICD-10-CM | POA: Diagnosis present

## 2011-12-10 DIAGNOSIS — Z471 Aftercare following joint replacement surgery: Secondary | ICD-10-CM | POA: Diagnosis not present

## 2011-12-10 DIAGNOSIS — Z01812 Encounter for preprocedural laboratory examination: Secondary | ICD-10-CM

## 2011-12-10 DIAGNOSIS — I10 Essential (primary) hypertension: Secondary | ICD-10-CM | POA: Diagnosis not present

## 2011-12-10 DIAGNOSIS — M25559 Pain in unspecified hip: Secondary | ICD-10-CM | POA: Diagnosis not present

## 2011-12-10 DIAGNOSIS — Z96649 Presence of unspecified artificial hip joint: Secondary | ICD-10-CM | POA: Diagnosis not present

## 2011-12-10 HISTORY — PX: TOTAL HIP ARTHROPLASTY: SHX124

## 2011-12-10 LAB — ABO/RH: ABO/RH(D): B POS

## 2011-12-10 IMAGING — CR DG PORTABLE PELVIS
1 series · 1 of 1 positions shown · non-contrast
Comparison: None.

CLINICAL DATA: Status post right hip arthroplasty.

PORTABLE PELVIS

[AP]
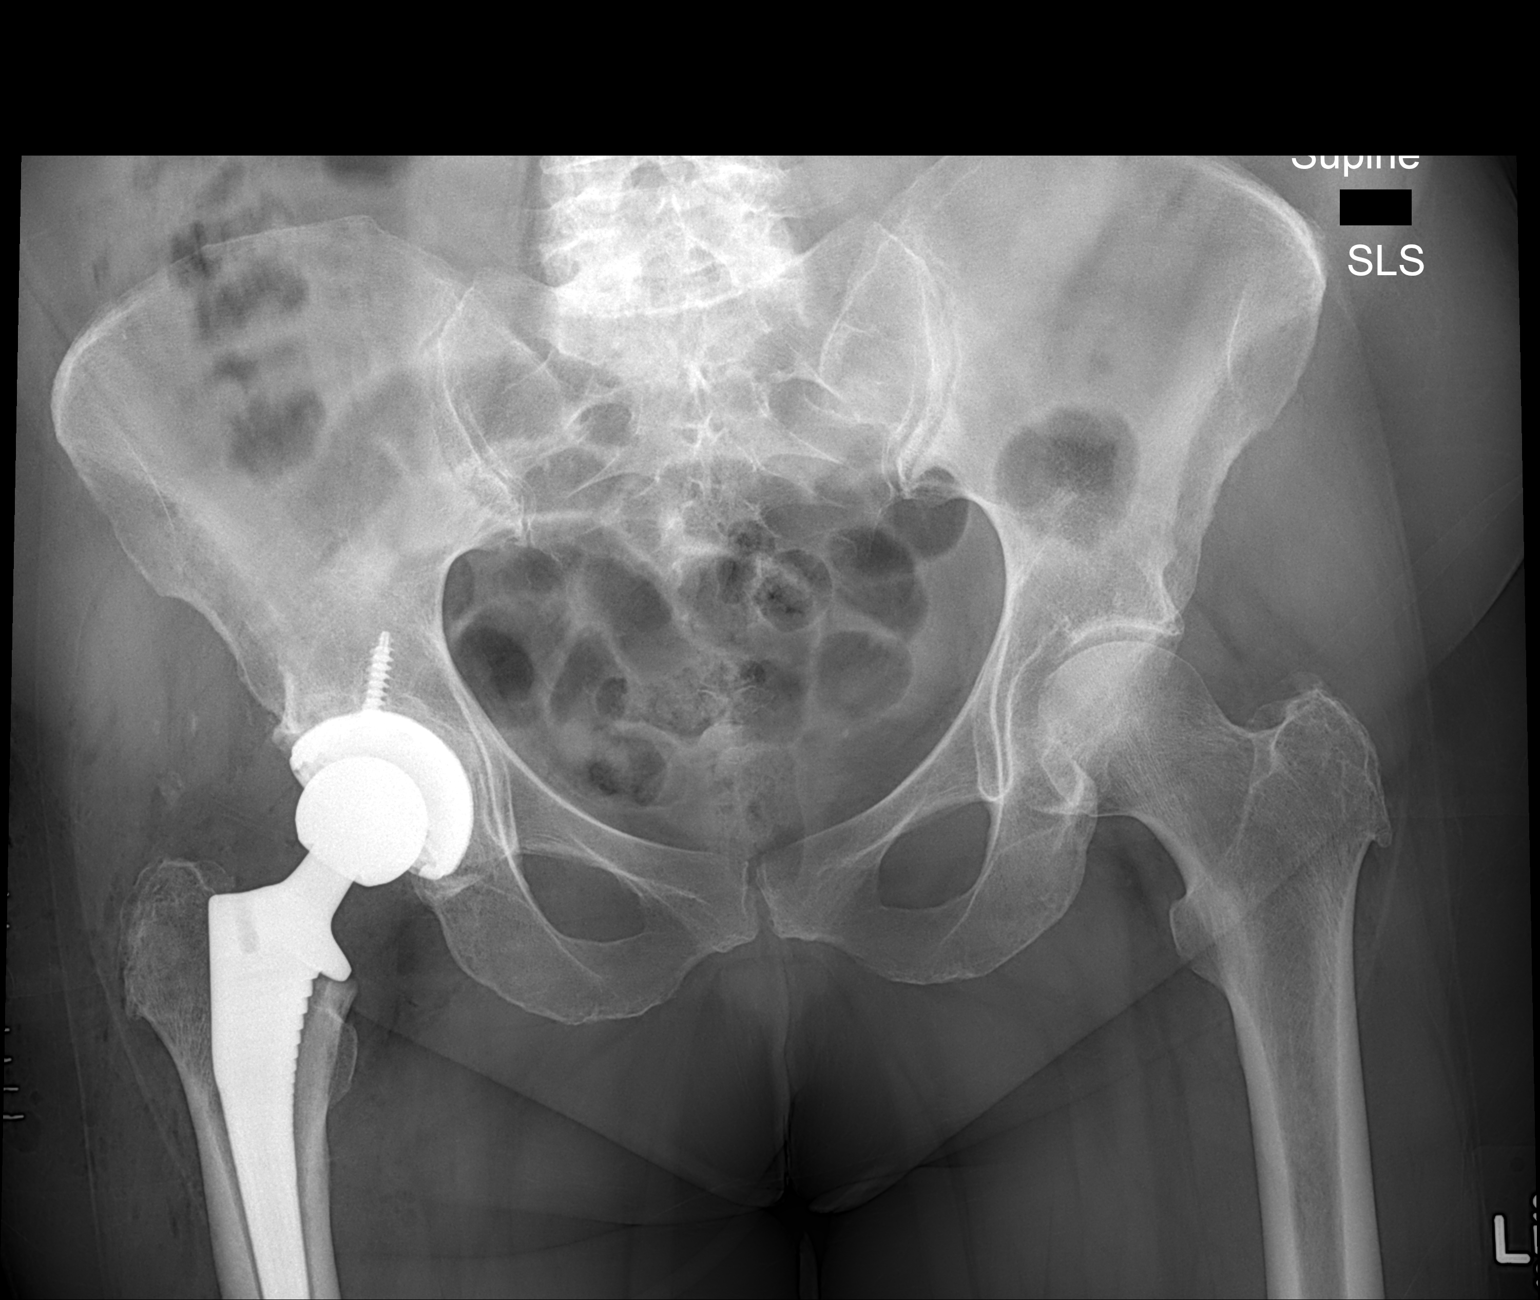

[1 of 1 positions shown; findings below may reference images not displayed]

FINDINGS: A single AP view of the pelvis demonstrates the patient
is status post right total hip arthroplasty.  The acetabular
component is well-seated.  Hip is located.  The distal aspect of
the femoral prosthesis is not imaged. Mild degenerative changes are
noted at the left hip.  Moderate degenerative changes are noted in
the lower lumbar spine.
IMPRESSION: 1.  Incomplete imaging of the right femoral prosthesis.  Distal tip
is not visualized.
2.  The visualized portions of the right prosthesis are normal.
3.  Moderate degenerative changes in the lower lumbar spine.

## 2011-12-10 IMAGING — RF DG HIP COMPLETE 2+V*R*
1 series · 2 of 2 positions shown · non-contrast
Comparison: None.

CLINICAL DATA: Right hip replacement.

RIGHT HIP - COMPLETE 2+ VIEW

[Series 1: run · 2 of 2 slices shown]
[im 1/2]
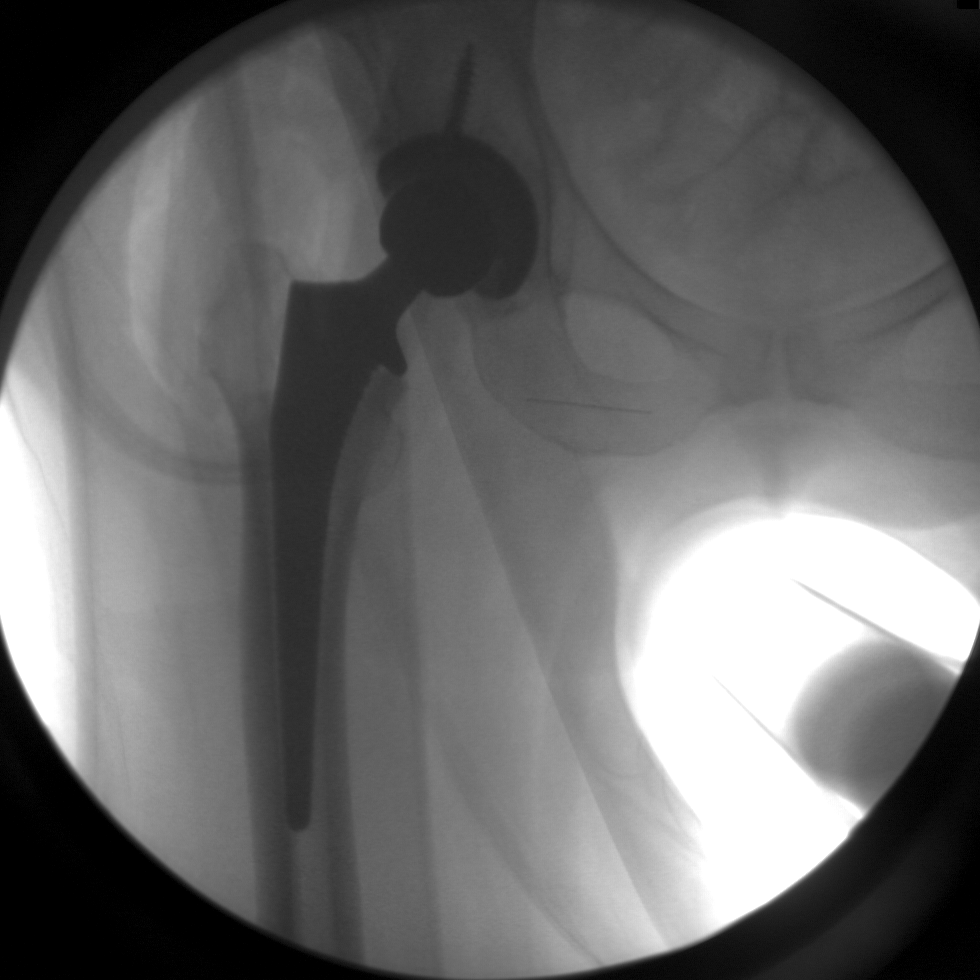
[im 2/2]
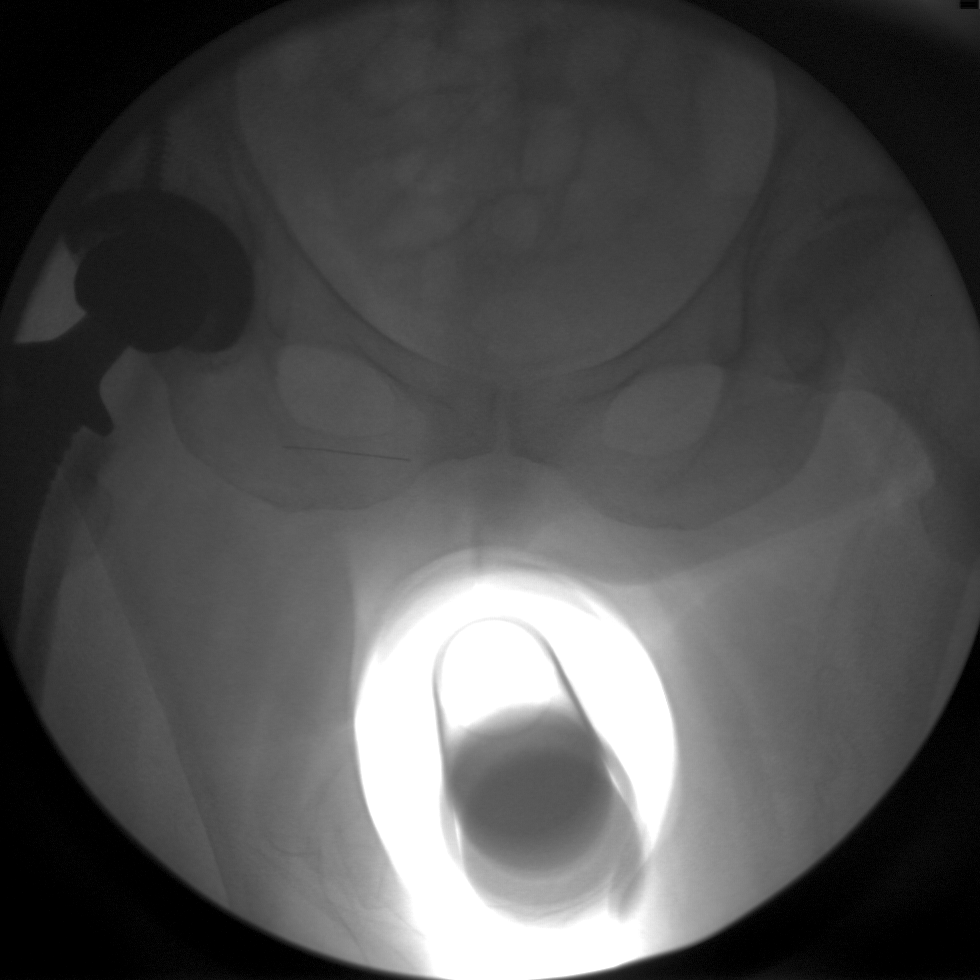

[2 of 2 positions shown; findings below may reference images not displayed]

FINDINGS: Two AP C-arm views of the right hip demonstrate a total
hip prosthesis in satisfactory position and alignment.  No fracture
or dislocation seen on these views.  Linear metallic density
overlying the right inferior pubic ramus.
IMPRESSION: 1.  Satisfactory postoperative appearance of a right total hip
prosthesis.
2.  Linear metallic density overlying the inferior pubic ramus on
the right.  This could be internal or external to the patient.

## 2011-12-10 IMAGING — CR DG HIP 1V PORT*R*
1 series · 1 of 1 positions shown · non-contrast
Comparison: [DATE]

CLINICAL DATA: status post right hip arthroplasty

PORTABLE RIGHT HIP - 1 VIEW

[AP]
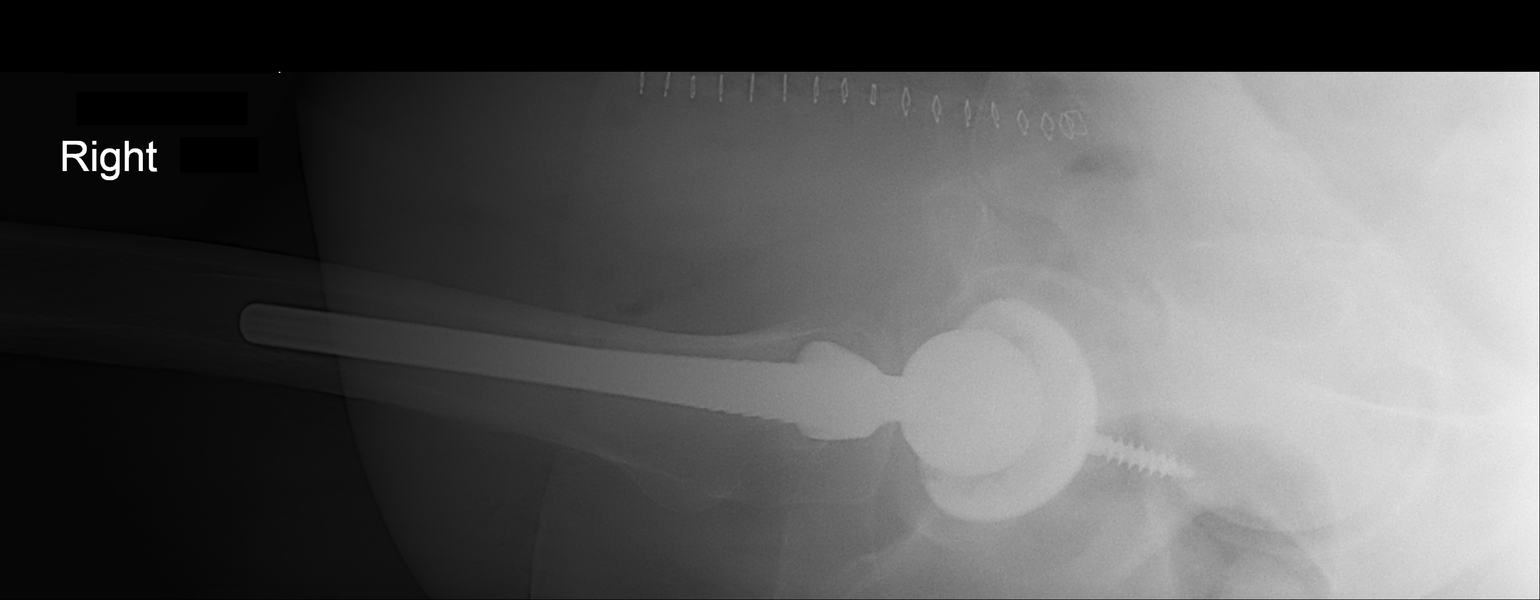

[1 of 1 positions shown; findings below may reference images not displayed]

FINDINGS: The patient is status post right hip arthroplasty.  The
hardware components are in anatomic alignment.  No complicating
features identified.
IMPRESSION: 1.  No complications after right hip arthroplasty.

## 2011-12-10 SURGERY — ARTHROPLASTY, HIP, TOTAL, ANTERIOR APPROACH
Anesthesia: General | Site: Hip | Laterality: Right | Wound class: Clean

## 2011-12-10 MED ORDER — ROCURONIUM BROMIDE 100 MG/10ML IV SOLN
INTRAVENOUS | Status: DC | PRN
  Administered 2011-12-10: 20 mg via INTRAVENOUS

## 2011-12-10 MED ORDER — METOCLOPRAMIDE HCL 10 MG PO TABS: 5.0000 mg | ORAL_TABLET | Freq: Three times a day (TID) | ORAL | Status: DC | PRN

## 2011-12-10 MED ORDER — METHOCARBAMOL 100 MG/ML IJ SOLN
500.0000 mg | Freq: Four times a day (QID) | INTRAVENOUS | Status: DC | PRN
  Administered 2011-12-10: 500 mg via INTRAVENOUS
  Filled 2011-12-10: qty 5

## 2011-12-10 MED ORDER — VERAPAMIL HCL ER 240 MG PO TBCR
240.0000 mg | EXTENDED_RELEASE_TABLET | Freq: Every day | ORAL | Status: DC
  Administered 2011-12-10 – 2011-12-12 (×3): 240 mg via ORAL
  Filled 2011-12-10 (×4): qty 1

## 2011-12-10 MED ORDER — ACETAMINOPHEN 10 MG/ML IV SOLN
INTRAVENOUS | Status: DC | PRN
  Administered 2011-12-10: 1000 mg via INTRAVENOUS

## 2011-12-10 MED ORDER — FERROUS SULFATE 325 (65 FE) MG PO TABS
325.0000 mg | ORAL_TABLET | Freq: Three times a day (TID) | ORAL | Status: DC
  Administered 2011-12-11 – 2011-12-13 (×7): 325 mg via ORAL
  Filled 2011-12-10 (×11): qty 1

## 2011-12-10 MED ORDER — METHOCARBAMOL 500 MG PO TABS
500.0000 mg | ORAL_TABLET | Freq: Four times a day (QID) | ORAL | Status: DC | PRN
  Administered 2011-12-11: 500 mg via ORAL
  Filled 2011-12-10: qty 1

## 2011-12-10 MED ORDER — SUCCINYLCHOLINE CHLORIDE 20 MG/ML IJ SOLN
INTRAMUSCULAR | Status: DC | PRN
  Administered 2011-12-10: 100 mg via INTRAVENOUS

## 2011-12-10 MED ORDER — ALBUTEROL SULFATE HFA 108 (90 BASE) MCG/ACT IN AERS
2.0000 | INHALATION_SPRAY | Freq: Four times a day (QID) | RESPIRATORY_TRACT | Status: DC | PRN
  Filled 2011-12-10: qty 6.7

## 2011-12-10 MED ORDER — ONDANSETRON HCL 4 MG/2ML IJ SOLN
INTRAMUSCULAR | Status: DC | PRN
  Administered 2011-12-10: 4 mg via INTRAVENOUS

## 2011-12-10 MED ORDER — ONDANSETRON HCL 4 MG/2ML IJ SOLN
4.0000 mg | Freq: Four times a day (QID) | INTRAMUSCULAR | Status: DC | PRN
  Administered 2011-12-10: 4 mg via INTRAVENOUS
  Filled 2011-12-10: qty 2

## 2011-12-10 MED ORDER — HYDROMORPHONE HCL PF 1 MG/ML IJ SOLN
INTRAMUSCULAR | Status: AC
  Filled 2011-12-10: qty 1

## 2011-12-10 MED ORDER — ALUM & MAG HYDROXIDE-SIMETH 200-200-20 MG/5ML PO SUSP: 30.0000 mL | ORAL | Status: DC | PRN

## 2011-12-10 MED ORDER — HYDROCODONE-ACETAMINOPHEN 5-325 MG PO TABS: 1.0000 | ORAL_TABLET | ORAL | Status: DC | PRN

## 2011-12-10 MED ORDER — DOCUSATE SODIUM 100 MG PO CAPS
100.0000 mg | ORAL_CAPSULE | Freq: Two times a day (BID) | ORAL | Status: DC
  Administered 2011-12-10 – 2011-12-13 (×6): 100 mg via ORAL
  Filled 2011-12-10 (×7): qty 1

## 2011-12-10 MED ORDER — CEFAZOLIN SODIUM 1-5 GM-% IV SOLN
1.0000 g | Freq: Four times a day (QID) | INTRAVENOUS | Status: AC
  Administered 2011-12-10 – 2011-12-11 (×3): 1 g via INTRAVENOUS
  Filled 2011-12-10 (×3): qty 50

## 2011-12-10 MED ORDER — LEVOTHYROXINE SODIUM 50 MCG PO TABS
50.0000 ug | ORAL_TABLET | Freq: Every day | ORAL | Status: DC
  Administered 2011-12-11 – 2011-12-13 (×3): 50 ug via ORAL
  Filled 2011-12-10 (×4): qty 1

## 2011-12-10 MED ORDER — 0.9 % SODIUM CHLORIDE (POUR BTL) OPTIME
TOPICAL | Status: DC | PRN
  Administered 2011-12-10: 1000 mL

## 2011-12-10 MED ORDER — HYDROMORPHONE HCL PF 1 MG/ML IJ SOLN
0.2500 mg | INTRAMUSCULAR | Status: DC | PRN
  Administered 2011-12-10: 0.5 mg via INTRAVENOUS
  Administered 2011-12-10 (×2): 0.25 mg via INTRAVENOUS

## 2011-12-10 MED ORDER — NALOXONE HCL 0.4 MG/ML IJ SOLN: 0.4000 mg | INTRAMUSCULAR | Status: DC | PRN

## 2011-12-10 MED ORDER — WARFARIN SODIUM 5 MG PO TABS
5.0000 mg | ORAL_TABLET | Freq: Once | ORAL | Status: AC
  Administered 2011-12-10: 5 mg via ORAL
  Filled 2011-12-10: qty 1

## 2011-12-10 MED ORDER — PHENOL 1.4 % MT LIQD: 1.0000 | OROMUCOSAL | Status: DC | PRN

## 2011-12-10 MED ORDER — SIMVASTATIN 10 MG PO TABS
10.0000 mg | ORAL_TABLET | Freq: Every day | ORAL | Status: DC
  Administered 2011-12-11 – 2011-12-12 (×2): 10 mg via ORAL
  Filled 2011-12-10 (×4): qty 1

## 2011-12-10 MED ORDER — MORPHINE SULFATE 2 MG/ML IJ SOLN: 1.0000 mg | INTRAMUSCULAR | Status: DC | PRN

## 2011-12-10 MED ORDER — DIPHENHYDRAMINE HCL 12.5 MG/5ML PO ELIX: 12.5000 mg | ORAL_SOLUTION | Freq: Four times a day (QID) | ORAL | Status: DC | PRN

## 2011-12-10 MED ORDER — LACTATED RINGERS IV SOLN
INTRAVENOUS | Status: DC | PRN
  Administered 2011-12-10 (×2): via INTRAVENOUS

## 2011-12-10 MED ORDER — PANTOPRAZOLE SODIUM 40 MG PO TBEC
40.0000 mg | DELAYED_RELEASE_TABLET | Freq: Every day | ORAL | Status: DC
  Administered 2011-12-11 – 2011-12-13 (×3): 40 mg via ORAL
  Filled 2011-12-10 (×4): qty 1

## 2011-12-10 MED ORDER — HETASTARCH-ELECTROLYTES 6 % IV SOLN
INTRAVENOUS | Status: DC | PRN
  Administered 2011-12-10: 14:00:00 via INTRAVENOUS

## 2011-12-10 MED ORDER — VANCOMYCIN HCL IN DEXTROSE 1-5 GM/200ML-% IV SOLN
1000.0000 mg | Freq: Once | INTRAVENOUS | Status: AC
  Administered 2011-12-10: 1000 mg via INTRAVENOUS

## 2011-12-10 MED ORDER — CEFAZOLIN SODIUM-DEXTROSE 2-3 GM-% IV SOLR: 2.0000 g | INTRAVENOUS | Status: DC

## 2011-12-10 MED ORDER — DIPHENHYDRAMINE HCL 12.5 MG/5ML PO ELIX: 12.5000 mg | ORAL_SOLUTION | ORAL | Status: DC | PRN

## 2011-12-10 MED ORDER — MORPHINE SULFATE (PF) 1 MG/ML IV SOLN
INTRAVENOUS | Status: DC
  Administered 2011-12-10: 1 mg via INTRAVENOUS

## 2011-12-10 MED ORDER — VANCOMYCIN HCL IN DEXTROSE 1-5 GM/200ML-% IV SOLN
INTRAVENOUS | Status: AC
  Filled 2011-12-10: qty 200

## 2011-12-10 MED ORDER — MENTHOL 3 MG MT LOZG
1.0000 | LOZENGE | OROMUCOSAL | Status: DC | PRN
  Filled 2011-12-10: qty 9

## 2011-12-10 MED ORDER — WARFARIN VIDEO
Freq: Once | Status: AC
  Administered 2011-12-11: 18:00:00

## 2011-12-10 MED ORDER — OXYCODONE HCL 5 MG PO TABS: 5.0000 mg | ORAL_TABLET | ORAL | Status: DC | PRN

## 2011-12-10 MED ORDER — ACETAMINOPHEN 325 MG PO TABS
650.0000 mg | ORAL_TABLET | Freq: Four times a day (QID) | ORAL | Status: DC | PRN
  Administered 2011-12-12 – 2011-12-13 (×3): 650 mg via ORAL
  Filled 2011-12-10 (×3): qty 2

## 2011-12-10 MED ORDER — ONDANSETRON HCL 4 MG/2ML IJ SOLN: 4.0000 mg | Freq: Four times a day (QID) | INTRAMUSCULAR | Status: DC | PRN

## 2011-12-10 MED ORDER — LOSARTAN POTASSIUM 50 MG PO TABS
100.0000 mg | ORAL_TABLET | Freq: Every day | ORAL | Status: DC
  Administered 2011-12-11 – 2011-12-12 (×2): 100 mg via ORAL
  Filled 2011-12-10 (×3): qty 2

## 2011-12-10 MED ORDER — MORPHINE SULFATE (PF) 1 MG/ML IV SOLN
INTRAVENOUS | Status: AC
  Filled 2011-12-10: qty 25

## 2011-12-10 MED ORDER — SODIUM CHLORIDE 0.9 % IV SOLN
INTRAVENOUS | Status: DC
  Administered 2011-12-10: 1000 mL via INTRAVENOUS
  Administered 2011-12-11 (×2): via INTRAVENOUS

## 2011-12-10 MED ORDER — ACETAMINOPHEN 650 MG RE SUPP: 650.0000 mg | Freq: Four times a day (QID) | RECTAL | Status: DC | PRN

## 2011-12-10 MED ORDER — LACTATED RINGERS IV SOLN
INTRAVENOUS | Status: DC
  Administered 2011-12-10: 1000 mL via INTRAVENOUS

## 2011-12-10 MED ORDER — HYDROCHLOROTHIAZIDE 25 MG PO TABS
25.0000 mg | ORAL_TABLET | Freq: Every day | ORAL | Status: DC
  Administered 2011-12-10 – 2011-12-12 (×3): 25 mg via ORAL
  Filled 2011-12-10 (×4): qty 1

## 2011-12-10 MED ORDER — METOCLOPRAMIDE HCL 5 MG/ML IJ SOLN: 5.0000 mg | Freq: Three times a day (TID) | INTRAMUSCULAR | Status: DC | PRN

## 2011-12-10 MED ORDER — ACETAMINOPHEN 10 MG/ML IV SOLN
INTRAVENOUS | Status: AC
  Filled 2011-12-10: qty 100

## 2011-12-10 MED ORDER — COUMADIN BOOK
Freq: Once | Status: AC
  Administered 2011-12-11: 18:00:00
  Filled 2011-12-10: qty 1

## 2011-12-10 MED ORDER — FENTANYL CITRATE 0.05 MG/ML IJ SOLN
INTRAMUSCULAR | Status: DC | PRN
  Administered 2011-12-10: 100 ug via INTRAVENOUS
  Administered 2011-12-10 (×3): 50 ug via INTRAVENOUS
  Administered 2011-12-10: 100 ug via INTRAVENOUS
  Administered 2011-12-10 (×3): 50 ug via INTRAVENOUS

## 2011-12-10 MED ORDER — ONDANSETRON HCL 4 MG PO TABS: 4.0000 mg | ORAL_TABLET | Freq: Four times a day (QID) | ORAL | Status: DC | PRN

## 2011-12-10 MED ORDER — AMITRIPTYLINE HCL 75 MG PO TABS
75.0000 mg | ORAL_TABLET | Freq: Every day | ORAL | Status: DC
  Administered 2011-12-10 – 2011-12-12 (×3): 75 mg via ORAL
  Filled 2011-12-10 (×4): qty 1

## 2011-12-10 MED ORDER — FLUTICASONE-SALMETEROL 250-50 MCG/DOSE IN AEPB
1.0000 | INHALATION_SPRAY | Freq: Two times a day (BID) | RESPIRATORY_TRACT | Status: DC | PRN
  Filled 2011-12-10: qty 14

## 2011-12-10 MED ORDER — LOSARTAN POTASSIUM-HCTZ 100-25 MG PO TABS: 1.0000 | ORAL_TABLET | Freq: Every day | ORAL | Status: DC

## 2011-12-10 MED ORDER — SODIUM CHLORIDE 0.9 % IJ SOLN: 9.0000 mL | INTRAMUSCULAR | Status: DC | PRN

## 2011-12-10 MED ORDER — MIDAZOLAM HCL 5 MG/5ML IJ SOLN
INTRAMUSCULAR | Status: DC | PRN
  Administered 2011-12-10: 2 mg via INTRAVENOUS

## 2011-12-10 MED ORDER — PROPOFOL 10 MG/ML IV BOLUS
INTRAVENOUS | Status: DC | PRN
  Administered 2011-12-10: 140 mg via INTRAVENOUS

## 2011-12-10 MED ORDER — NEOSTIGMINE METHYLSULFATE 1 MG/ML IJ SOLN
INTRAMUSCULAR | Status: DC | PRN
  Administered 2011-12-10: 4 mg via INTRAVENOUS

## 2011-12-10 MED ORDER — DIPHENHYDRAMINE HCL 50 MG/ML IJ SOLN: 12.5000 mg | Freq: Four times a day (QID) | INTRAMUSCULAR | Status: DC | PRN

## 2011-12-10 MED ORDER — DEXAMETHASONE SODIUM PHOSPHATE 10 MG/ML IJ SOLN
INTRAMUSCULAR | Status: DC | PRN
  Administered 2011-12-10: 10 mg via INTRAVENOUS

## 2011-12-10 MED ORDER — GLYCOPYRROLATE 0.2 MG/ML IJ SOLN
INTRAMUSCULAR | Status: DC | PRN
  Administered 2011-12-10: .5 mg via INTRAVENOUS

## 2011-12-10 MED ORDER — ZOLPIDEM TARTRATE 5 MG PO TABS: 5.0000 mg | ORAL_TABLET | Freq: Every evening | ORAL | Status: DC | PRN

## 2011-12-10 MED ORDER — WARFARIN - PHARMACIST DOSING INPATIENT: Freq: Every day | Status: DC

## 2011-12-10 SURGICAL SUPPLY — 39 items
BAG SPEC THK2 15X12 ZIP CLS (MISCELLANEOUS) ×2
BAG ZIPLOCK 12X15 (MISCELLANEOUS) ×4 IMPLANT
BLADE SAW SGTL 18X1.27X75 (BLADE) ×2 IMPLANT
CELLS DAT CNTRL 66122 CELL SVR (MISCELLANEOUS) ×1 IMPLANT
CLOTH BEACON ORANGE TIMEOUT ST (SAFETY) ×2 IMPLANT
DRAPE C-ARM 42X72 X-RAY (DRAPES) ×2 IMPLANT
DRAPE STERI IOBAN 125X83 (DRAPES) ×2 IMPLANT
DRAPE U-SHAPE 47X51 STRL (DRAPES) ×6 IMPLANT
DRSG MEPILEX BORDER 4X8 (GAUZE/BANDAGES/DRESSINGS) ×2 IMPLANT
DURAPREP 26ML APPLICATOR (WOUND CARE) ×2 IMPLANT
ELECT BLADE TIP CTD 4 INCH (ELECTRODE) ×2 IMPLANT
ELECT REM PT RETURN 9FT ADLT (ELECTROSURGICAL) ×2
ELECTRODE REM PT RTRN 9FT ADLT (ELECTROSURGICAL) ×1 IMPLANT
FACESHIELD LNG OPTICON STERILE (SAFETY) ×8 IMPLANT
GAUZE XEROFORM 1X8 LF (GAUZE/BANDAGES/DRESSINGS) ×2 IMPLANT
GLOVE BIO SURGEON STRL SZ7 (GLOVE) ×2 IMPLANT
GLOVE BIO SURGEON STRL SZ7.5 (GLOVE) ×2 IMPLANT
GLOVE BIOGEL PI IND STRL 7.5 (GLOVE) IMPLANT
GLOVE BIOGEL PI IND STRL 8 (GLOVE) ×1 IMPLANT
GLOVE BIOGEL PI INDICATOR 7.5 (GLOVE)
GLOVE BIOGEL PI INDICATOR 8 (GLOVE) ×1
GLOVE ECLIPSE 7.0 STRL STRAW (GLOVE) ×2 IMPLANT
GOWN STRL REIN XL XLG (GOWN DISPOSABLE) ×4 IMPLANT
KIT BASIN OR (CUSTOM PROCEDURE TRAY) ×2 IMPLANT
LINER ACET PNNCL PLUS4 NEUTRAL (Hips) IMPLANT
PACK TOTAL JOINT (CUSTOM PROCEDURE TRAY) ×2 IMPLANT
PADDING CAST COTTON 6X4 STRL (CAST SUPPLIES) ×2 IMPLANT
PINNACLE PLUS 4 NEUTRAL (Hips) IMPLANT
RETRACTOR WND ALEXIS 18 MED (MISCELLANEOUS) ×1 IMPLANT
RTRCTR WOUND ALEXIS 18CM MED (MISCELLANEOUS) ×2
STAPLER SKIN PROX WIDE 3.9 (STAPLE) IMPLANT
SUT ETHIBOND NAB CT1 #1 30IN (SUTURE) ×4 IMPLANT
SUT VIC AB 1 CT1 36 (SUTURE) ×4 IMPLANT
SUT VIC AB 2-0 CT1 27 (SUTURE) ×4
SUT VIC AB 2-0 CT1 TAPERPNT 27 (SUTURE) ×2 IMPLANT
SUT VLOC 180 0 24IN GS25 (SUTURE) ×1 IMPLANT
TOWEL OR 17X26 10 PK STRL BLUE (TOWEL DISPOSABLE) ×4 IMPLANT
TOWEL OR NON WOVEN STRL DISP B (DISPOSABLE) ×2 IMPLANT
TRAY FOLEY CATH 14FRSI W/METER (CATHETERS) ×2 IMPLANT

## 2011-12-10 NOTE — H&P (Signed)
Cassidy Garcia is an 66 y.o. female.   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  This is well-documented with a decrease in her mobility and quality of life.  She has failed conservative treatment.  She wishes to proceed with a right total hip replacement.  The goals are decreased pain and improved mobility.  The risks are acute blood loss, nerve injury, fracture, DVT, PE.  Past Medical History  Diagnosis Date  . Diverticulosis   . Hemorrhoids   . Hyperlipidemia   . GERD (gastroesophageal reflux disease)   . Hypertension   . Colon polyps     tubular adenoma  . Skin cancer   . Hypothyroidism   . Asthma   . Depression   . Arthritis   . Shortness of breath     with exertion on occasion  . Constipation     Past Surgical History  Procedure Date  . Nasal sinus surgery   . Abdominal hysterectomy   . Cholecystectomy   . Colonoscopy 02/12/2011  . Polypectomy   . Hand surgery   . Feet surgery     Family History  Problem Relation Age of Onset  . Ovarian cancer Mother   . Colon cancer Neg Hx    Social History:  reports that she quit smoking about 10 years ago. She has never used smokeless tobacco. She reports that she drinks alcohol. She reports that she does not use illicit drugs.  Allergies:  Allergies  Allergen Reactions  . Codeine     REACTION: Lightheaded, nauses  . Demerol (Meperidine Hcl) Other (See Comments)    Dizziness     No prescriptions prior to admission    No results found for this or any previous visit (from the past 48 hour(s)). No results found.  Review of Systems  All other systems reviewed and are negative.    There were no vitals taken for this visit. Physical Exam  Constitutional: She is oriented to person, place, and time. She appears well-developed and well-nourished.  HENT:  Head: Normocephalic and atraumatic.  Eyes: EOM are normal. Pupils are equal, round, and reactive to light.    Neck: Normal range of motion. Neck supple.  Cardiovascular: Normal rate and regular rhythm.   Respiratory: Effort normal and breath sounds normal.  GI: Soft. Bowel sounds are normal.  Musculoskeletal:       Right hip: She exhibits decreased range of motion, decreased strength, bony tenderness and crepitus.  Neurological: She is alert and oriented to person, place, and time.  Skin: Skin is warm and dry.  Psychiatric: She has a normal mood and affect.     Assessment/Plan To the OR for a right total hip replacement.  Evanee Lubrano Y 12/10/2011, 7:02 AM

## 2011-12-10 NOTE — H&P (Signed)
  I have seen and examined the patient.  There has been no change in her medical status.  She wishes to proceed with a right hip replacement and consents for this.  She understands fully the risks and benefits.

## 2011-12-10 NOTE — Progress Notes (Signed)
ANTICOAGULATION CONSULT NOTE - Initial Consult  Pharmacy Consult for Warfarin Indication: VTE Prophylaxis s/p R THA  Allergies  Allergen Reactions  . Codeine     REACTION: Lightheaded, nauses  . Demerol (Meperidine Hcl) Other (See Comments)    Dizziness     Patient Measurements:   12/07/11: ht= 165cm, wt=86.2kg  Vital Signs: Temp: 97.7 F (36.5 C) (04/26 1557) Temp src: Oral (04/26 1034) BP: 120/71 mmHg (04/26 1557) Pulse Rate: 65  (04/26 1557)  Labs: No results found for this basename: HGB:2,HCT:3,PLT:3,APTT:3,LABPROT:3,INR:3,HEPARINUNFRC:3,CREATININE:3,CKTOTAL:3,CKMB:3,TROPONINI:3 in the last 72 hours The CrCl is unknown because both a height and weight (above a minimum accepted value) are required for this calculation.  Medical History: Past Medical History  Diagnosis Date  . Diverticulosis   . Hemorrhoids   . Hyperlipidemia   . GERD (gastroesophageal reflux disease)   . Hypertension   . Colon polyps     tubular adenoma  . Skin cancer   . Hypothyroidism   . Asthma   . Depression   . Arthritis   . Shortness of breath     with exertion on occasion  . Constipation     Medications:  Scheduled:    . amitriptyline  75 mg Oral QHS  .  ceFAZolin (ANCEF) IV  1 g Intravenous Q6H  . docusate sodium  100 mg Oral BID  . ferrous sulfate  325 mg Oral TID PC  . hydrochlorothiazide  25 mg Oral Daily  . HYDROmorphone      . levothyroxine  50 mcg Oral Q breakfast  . losartan  100 mg Oral Daily  . morphine   Intravenous Q4H  . morphine      . pantoprazole  40 mg Oral Q1200  . simvastatin  10 mg Oral q1800  . vancomycin  1,000 mg Intravenous Once  . verapamil  240 mg Oral QHS  . DISCONTD:  ceFAZolin (ANCEF) IV  2 g Intravenous 60 min Pre-Op  . DISCONTD: losartan-hydrochlorothiazide  1 tablet Oral Q breakfast   Infusions:    . sodium chloride 1,000 mL (12/10/11 1511)  . DISCONTD: lactated ringers 1,000 mL (12/10/11 1206)   PRN: acetaminophen, acetaminophen,  albuterol, alum & mag hydroxide-simeth, diphenhydrAMINE, diphenhydrAMINE, diphenhydrAMINE, Fluticasone-Salmeterol, HYDROcodone-acetaminophen, menthol-cetylpyridinium, methocarbamol (ROBAXIN) IV, methocarbamol, metoCLOPramide (REGLAN) injection, metoCLOPramide, morphine, naloxone, ondansetron (ZOFRAN) IV, ondansetron (ZOFRAN) IV, ondansetron, oxyCODONE, phenol, sodium chloride, zolpidem DISCONTD: 0.9 % irrigation (POUR BTL), DISCONTD: HYDROmorphone  Assessment: 66 yo F with end-stage osteoarthritis, now s/p R THA to start warfarin for DVT prophylaxis.  Goal of Therapy:  INR 2-3   Plan:  1) Warfarin 5mg  PO x1 at 20:00 tonight 2) Daily PT/INR 3) Warfarin book and video tomorrow (if alert enough) 4) Warfarin education to be completed prior to discharge.  Verdia Kuba, PharmD Pager: [phone] 12/10/2011,4:34 PM

## 2011-12-10 NOTE — Anesthesia Postprocedure Evaluation (Signed)
  Anesthesia Post-op Note  Patient: Cassidy Garcia  Procedure(s) Performed: Procedure(s) (LRB): TOTAL HIP ARTHROPLASTY ANTERIOR APPROACH (Right)  Patient Location: PACU  Anesthesia Type: General  Level of Consciousness: oriented and sedated  Airway and Oxygen Therapy: Patient Spontanous Breathing and Patient connected to nasal cannula oxygen  Post-op Pain: mild  Post-op Assessment: Post-op Vital signs reviewed, Patient's Cardiovascular Status Stable, Respiratory Function Stable and Patent Airway  Post-op Vital Signs: stable  Complications: No apparent anesthesia complications

## 2011-12-10 NOTE — Transfer of Care (Signed)
Immediate Anesthesia Transfer of Care Note  Patient: Cassidy Garcia  Procedure(s) Performed: Procedure(s) (LRB): TOTAL HIP ARTHROPLASTY ANTERIOR APPROACH (Right)  Patient Location: PACU  Anesthesia Type: General  Level of Consciousness: awake and patient cooperative  Airway & Oxygen Therapy: Patient Spontanous Breathing and Patient connected to face mask oxygen  Post-op Assessment: Report given to PACU RN and Post -op Vital signs reviewed and stable  Post vital signs: Reviewed and stable  Complications: No apparent anesthesia complications

## 2011-12-10 NOTE — Brief Op Note (Signed)
12/10/2011  2:26 PM  PATIENT:  Cassidy Garcia  66 y.o. female  PRE-OPERATIVE DIAGNOSIS:  Right hip severe arthritis  POST-OPERATIVE DIAGNOSIS:  Right hip severe arthritis  PROCEDURE:  Procedure(s) (LRB): TOTAL HIP ARTHROPLASTY ANTERIOR APPROACH (Right)  SURGEON:  Surgeon(s) and Role:    * Mcarthur Rossetti, MD - Primary  PHYSICIAN ASSISTANT:   ASSISTANTS: none   ANESTHESIA:   general  EBL:  Total I/O In: 2000 [I.V.:1500; IV Piggyback:500] Out: 1000 [Urine:100; Blood:900]  BLOOD ADMINISTERED:none  DRAINS: none   LOCAL MEDICATIONS USED:  NONE  SPECIMEN:  No Specimen  DISPOSITION OF SPECIMEN:  N/A  COUNTS:  YES  TOURNIQUET:  * No tourniquets in log *  DICTATION: .Other Dictation: Dictation Number [id number]  PLAN OF CARE: Admit to inpatient   PATIENT DISPOSITION:  PACU - hemodynamically stable.   Delay start of Pharmacological VTE agent (>24hrs) due to surgical blood loss or risk of bleeding: no

## 2011-12-10 NOTE — Anesthesia Preprocedure Evaluation (Signed)
Anesthesia Evaluation  Patient identified by MRN, date of birth, ID band Patient awake    Reviewed: Allergy & Precautions, H&P , NPO status , Patient's Chart, lab work & pertinent test results, reviewed documented beta blocker date and time   Airway Mallampati: II TM Distance: >3 FB Neck ROM: Full    Dental  (+) Teeth Intact and Dental Advisory Given   Pulmonary asthma ,  Mild asthma-rare use of inhaler breath sounds clear to auscultation        Cardiovascular hypertension, Pt. on medications Rhythm:Regular Rate:Normal  Denies cardiac symptoms   Neuro/Psych negative neurological ROS  negative psych ROS   GI/Hepatic negative GI ROS, Neg liver ROS,   Endo/Other  Hypothyroidism Thyroid replacement  Renal/GU Slightly elevated Cr 1.45  negative genitourinary   Musculoskeletal   Abdominal   Peds negative pediatric ROS (+)  Hematology negative hematology ROS (+)   Anesthesia Other Findings Upper front caps  Reproductive/Obstetrics negative OB ROS                           Anesthesia Physical Anesthesia Plan  ASA: II  Anesthesia Plan: General   Post-op Pain Management:    Induction: Intravenous  Airway Management Planned: Oral ETT  Additional Equipment:   Intra-op Plan:   Post-operative Plan: Extubation in OR  Informed Consent: I have reviewed the patients History and Physical, chart, labs and discussed the procedure including the risks, benefits and alternatives for the proposed anesthesia with the patient or authorized representative who has indicated his/her understanding and acceptance.   Dental advisory given  Plan Discussed with: CRNA and Surgeon  Anesthesia Plan Comments:         Anesthesia Quick Evaluation

## 2011-12-11 LAB — BASIC METABOLIC PANEL
BUN: 29 mg/dL — ABNORMAL HIGH (ref 6–23)
Chloride: 104 mEq/L (ref 96–112)
GFR calc Af Amer: 69 mL/min — ABNORMAL LOW (ref >90)
GFR calc non Af Amer: 60 mL/min — ABNORMAL LOW (ref >90)
Potassium: 3.9 mEq/L (ref 3.5–5.1)

## 2011-12-11 LAB — CBC
MCHC: 34.7 g/dL (ref 30.0–36.0)
Platelets: 174 10*3/uL (ref 150–400)
RDW: 12.6 % (ref 11.5–15.5)
WBC: 10.2 10*3/uL (ref 4.0–10.5)

## 2011-12-11 LAB — PROTIME-INR: Prothrombin Time: 14.6 seconds (ref 11.6–15.2)

## 2011-12-11 MED ORDER — SODIUM CHLORIDE 0.9 % IJ SOLN: 9.0000 mL | INTRAMUSCULAR | Status: DC | PRN

## 2011-12-11 MED ORDER — ONDANSETRON HCL 4 MG/2ML IJ SOLN: 4.0000 mg | Freq: Four times a day (QID) | INTRAMUSCULAR | Status: DC | PRN

## 2011-12-11 MED ORDER — WARFARIN SODIUM 5 MG PO TABS
5.0000 mg | ORAL_TABLET | Freq: Once | ORAL | Status: AC
  Administered 2011-12-11: 5 mg via ORAL
  Filled 2011-12-11: qty 1

## 2011-12-11 MED ORDER — NALOXONE HCL 0.4 MG/ML IJ SOLN: 0.4000 mg | INTRAMUSCULAR | Status: DC | PRN

## 2011-12-11 MED ORDER — DIPHENHYDRAMINE HCL 50 MG/ML IJ SOLN: 12.5000 mg | Freq: Four times a day (QID) | INTRAMUSCULAR | Status: DC | PRN

## 2011-12-11 MED ORDER — MORPHINE SULFATE (PF) 1 MG/ML IV SOLN
INTRAVENOUS | Status: DC
  Administered 2011-12-11: 3 mg via INTRAVENOUS
  Administered 2011-12-11 – 2011-12-12 (×2): 1 mg via INTRAVENOUS

## 2011-12-11 MED ORDER — DIPHENHYDRAMINE HCL 12.5 MG/5ML PO ELIX: 12.5000 mg | ORAL_SOLUTION | Freq: Four times a day (QID) | ORAL | Status: DC | PRN

## 2011-12-11 NOTE — Evaluation (Signed)
Physical Therapy Evaluation Patient Details Name: Cassidy Garcia MRN: [id number] DOB: July 14, 1946 Today's Date: 12/11/2011 Time: 0832-0913 PT Time Calculation (min): 41 min  PT Assessment / Plan / Recommendation Clinical Impression  Pt with R THR presents with decreased R LE strength/ROm and limitations in functional mobility    PT Assessment  Patient needs continued PT services    Follow Up Recommendations  Home health PT    Equipment Recommendations  Rolling walker with 5" wheels    Frequency 7X/week    Precautions / Restrictions Restrictions Weight Bearing Restrictions: No Other Position/Activity Restrictions: WBAT         Mobility  Bed Mobility Bed Mobility: Supine to Sit Supine to Sit: 3: Mod assist Details for Bed Mobility Assistance: cues for sequence and use of UEs to self assist Transfers Transfers: Sit to Stand;Stand to Sit Sit to Stand: 3: Mod assist;4: Min assist Stand to Sit: 3: Mod assist;4: Min assist Details for Transfer Assistance: cues for use of UEs and for LE management Ambulation/Gait Ambulation/Gait Assistance: 4: Min assist;3: Mod assist Ambulation Distance (Feet): 6 Feet Assistive device: Rolling walker Ambulation/Gait Assistance Details: cues for posture, sequence, and positiion from RW Gait Pattern: Step-to pattern    Exercises Total Joint Exercises Ankle Circles/Pumps: AROM;10 reps;Supine;Both Quad Sets: AROM;10 reps;Supine;Both Heel Slides: AAROM;10 reps;Supine;Right Hip ABduction/ADduction: AAROM;10 reps;Supine;Right   PT Goals Acute Rehab PT Goals PT Goal Formulation: With patient Time For Goal Achievement: 12/18/11 Potential to Achieve Goals: Good Pt will go Supine/Side to Sit: with supervision PT Goal: Supine/Side to Sit - Progress: Goal set today Pt will go Sit to Supine/Side: with supervision PT Goal: Sit to Supine/Side - Progress: Goal set today Pt will go Sit to Stand: with supervision PT Goal: Sit to Stand - Progress:  Goal set today Pt will go Stand to Sit: with supervision PT Goal: Stand to Sit - Progress: Goal set today Pt will Ambulate: 51 - 150 feet;with supervision;with rolling walker PT Goal: Ambulate - Progress: Goal set today Pt will Go Up / Down Stairs: 1-2 stairs;with min assist;with least restrictive assistive device PT Goal: Up/Down Stairs - Progress: Goal set today  Visit Information  Last PT Received On: 12/11/11 Assistance Needed: +1    Subjective Data  Subjective: It was uncomfortable to walk but not too bad Patient Stated Goal: Resume previous lifestyle with decreased pain   Prior Functioning  Home Living Lives With: Alone Available Help at Discharge: Family Type of Home: House Home Access: Stairs to enter Technical brewer of Steps: 2 Entrance Stairs-Rails: None Home Layout: Able to live on main level with bedroom/bathroom Home Adaptive Equipment: None Prior Function Level of Independence: Independent Able to Take Stairs?: Yes Driving: Yes Communication Communication: No difficulties Dominant Hand: Right    Cognition  Overall Cognitive Status: Appears within functional limits for tasks assessed/performed Arousal/Alertness: Awake/alert Orientation Level: Appears intact for tasks assessed Behavior During Session: [facility] for tasks performed    Extremity/Trunk Assessment Right Upper Extremity Assessment RUE ROM/Strength/Tone: Within functional levels Left Upper Extremity Assessment LUE ROM/Strength/Tone: Within functional levels Right Lower Extremity Assessment RLE ROM/Strength/Tone: Deficits RLE ROM/Strength/Tone Deficits: R hip strength 2+/5; hip flex to 90; abd to 25 AAROM  - ltd by discomfort Left Lower Extremity Assessment LLE ROM/Strength/Tone: Within functional levels   Balance    End of Session PT - End of Session Equipment Utilized During Treatment: Gait belt Activity Tolerance: Patient tolerated treatment well Patient left: in chair;with call  bell/phone within reach Nurse Communication: Mobility status  Bryann Gentz 12/11/2011, 2:54 PM

## 2011-12-11 NOTE — Progress Notes (Signed)
ANTICOAGULATION CONSULT NOTE - Initial Consult  Pharmacy Consult for Warfarin Indication: VTE Prophylaxis s/p R THA  Allergies  Allergen Reactions  . Codeine     REACTION: Lightheaded, nauses  . Demerol (Meperidine Hcl) Other (See Comments)    Dizziness     Patient Measurements: Height: 5\' 5"  (165.1 cm) Weight: 190 lb (86.183 kg) IBW/kg (Calculated) : 57  12/07/11: ht= 165cm, wt=86.2kg  Vital Signs: Temp: 97.6 F (36.4 C) (04/27 0540) Temp src: Oral (04/27 0540) BP: 114/71 mmHg (04/27 0540) Pulse Rate: 83  (04/27 0540)  Labs:  Basename 12/11/11 0452  HGB 8.7*  HCT 25.1*  PLT 174  APTT --  LABPROT 14.6  INR 1.12  HEPARINUNFRC --  CREATININE 0.97  CKTOTAL --  CKMB --  TROPONINI --   Estimated Creatinine Clearance: 61.9 ml/min (by C-G formula based on Cr of 0.97).  Medical History: Past Medical History  Diagnosis Date  . Diverticulosis   . Hemorrhoids   . Hyperlipidemia   . GERD (gastroesophageal reflux disease)   . Hypertension   . Colon polyps     tubular adenoma  . Skin cancer   . Hypothyroidism   . Asthma   . Depression   . Arthritis   . Shortness of breath     with exertion on occasion  . Constipation     Medications:  Scheduled:     . amitriptyline  75 mg Oral QHS  .  ceFAZolin (ANCEF) IV  1 g Intravenous Q6H  . coumadin book   Does not apply Once  . docusate sodium  100 mg Oral BID  . ferrous sulfate  325 mg Oral TID PC  . hydrochlorothiazide  25 mg Oral Daily  . HYDROmorphone      . levothyroxine  50 mcg Oral Q breakfast  . losartan  100 mg Oral Daily  . morphine   Intravenous Q4H  . pantoprazole  40 mg Oral Q1200  . simvastatin  10 mg Oral q1800  . vancomycin  1,000 mg Intravenous Once  . verapamil  240 mg Oral QHS  . warfarin  5 mg Oral Once  . warfarin   Does not apply Once  . Warfarin - Pharmacist Dosing Inpatient   Does not apply q1800  . DISCONTD:  ceFAZolin (ANCEF) IV  2 g Intravenous 60 min Pre-Op  . DISCONTD:  losartan-hydrochlorothiazide  1 tablet Oral Q breakfast  . DISCONTD: morphine   Intravenous Q4H  . DISCONTD: morphine       Infusions:     . sodium chloride 75 mL/hr at 12/11/11 0619  . DISCONTD: lactated ringers 1,000 mL (12/10/11 1206)   PRN: acetaminophen, acetaminophen, albuterol, alum & mag hydroxide-simeth, diphenhydrAMINE, diphenhydrAMINE, diphenhydrAMINE, Fluticasone-Salmeterol, HYDROcodone-acetaminophen, menthol-cetylpyridinium, methocarbamol (ROBAXIN) IV, methocarbamol, metoCLOPramide (REGLAN) injection, metoCLOPramide, morphine, naloxone, ondansetron (ZOFRAN) IV, ondansetron, oxyCODONE, phenol, sodium chloride, zolpidem, DISCONTD: 0.9 % irrigation (POUR BTL) DISCONTD: diphenhydrAMINE, DISCONTD: diphenhydrAMINE, DISCONTD: HYDROmorphone, DISCONTD: naloxone, DISCONTD: ondansetron (ZOFRAN) IV, DISCONTD: ondansetron (ZOFRAN) IV, DISCONTD: sodium chloride  Assessment:  66 yo F with end-stage osteoarthritis, now s/p R THA to start warfarin for DVT prophylaxis.  INR low but trending up during initiation process  Post op Hgb decreased 8.7 (14.4 pre op). Will monitor  No bleeding/complications reported  Goal of Therapy:  INR 2-3   Plan:  1) Warfarin 5mg  PO x1 at 1800 2) follow Daily PT/INR 3.) follow up education completion     Montrail Mehrer, Gaye Alken PharmD 8:13 AM 12/11/2011

## 2011-12-11 NOTE — Progress Notes (Signed)
CSW aware of potential disposition needs. CSW awaiting pt/ot evaluation to help determine pt needs at discharge.   Dorathy Kinsman, [place] .12/11/2011 1135am

## 2011-12-11 NOTE — Progress Notes (Signed)
Physical Therapy Treatment Patient Details Name: Cassidy Garcia MRN: [id number] DOB: 1946/03/01 Today's Date: 12/11/2011 Time: [id number] PT Time Calculation (min): 30 min  PT Assessment / Plan / Recommendation Comments on Treatment Session  Increased activity tolerance this pm with increased gait distance and less assistance needed with mobility.    Follow Up Recommendations  Home health PT    Equipment Recommendations  Rolling walker with 5" wheels    Frequency 7X/week   Plan Discharge plan remains appropriate;Frequency remains appropriate    Precautions / Restrictions Precautions Precautions: Anterior Hip;None Restrictions RLE Weight Bearing: Weight bearing as tolerated       Mobility  Bed Mobility Sit to Supine: 4: Min assist;HOB flat Details for Bed Mobility Assistance: assist needed to elevate bil legs onto bed surface. pt able to self control and lower trunk onto bed. Transfers Transfers: Sit to Stand;Stand to Sit Sit to Stand: 4: Min guard;From chair/3-in-1;With upper extremity assist;With armrests Stand to Sit: 4: Min assist;To bed;With upper extremity assist Details for Transfer Assistance: cues for hand placement and right LE placement with transfers. Ambulation/Gait Ambulation/Gait Assistance: 4: Min assist Ambulation Distance (Feet): 26 Feet Assistive device: Rolling walker Ambulation/Gait Assistance Details: cues for posture, sequence and walker position with gait. Gait Pattern: Step-to pattern;Antalgic;Decreased stance time - right;Decreased step length - left    Exercises Total Joint Exercises Ankle Circles/Pumps: AROM;Both;10 reps;Supine Quad Sets: AROM;Both;10 reps;Supine Long Arc Quad: AROM;Strengthening;Both;10 reps;Seated   PT Goals Acute Rehab PT Goals PT Goal: Sit to Supine/Side - Progress: Progressing toward goal PT Goal: Sit to Stand - Progress: Progressing toward goal PT Goal: Stand to Sit - Progress: Progressing toward goal PT Goal: Ambulate  - Progress: Progressing toward goal  Visit Information  Last PT Received On: 12/11/11 Assistance Needed: +1    Subjective Data  Subjective: No new complaints. Agreeable to therapy at this time.   Cognition  Overall Cognitive Status: Appears within functional limits for tasks assessed/performed Arousal/Alertness: Awake/alert Orientation Level: Appears intact for tasks assessed Behavior During Session: [facility] for tasks performed       End of Session PT - End of Session Equipment Utilized During Treatment: Gait belt Activity Tolerance: Patient tolerated treatment well Patient left: in bed;with call bell/phone within reach;with nursing in room Nurse Communication: Mobility status;Weight bearing status    Willow Ora 12/11/2011, 3:17 PM  Willow Ora, PTA Office- [phone] Pager- [phone]

## 2011-12-11 NOTE — Progress Notes (Signed)
Subjective: 1 Day Post-Op Procedure(s) (LRB): TOTAL HIP ARTHROPLASTY ANTERIOR APPROACH (Right) Patient reports pain as moderate.    Objective: Vital signs in last 24 hours: Temp:  [97.2 F (36.2 C)-98.8 F (37.1 C)] 97.6 F (36.4 C) (04/27 0540) Pulse Rate:  [58-108] 83  (04/27 0540) Resp:  [10-20] 10  (04/27 0540) BP: (114-149)/(50-92) 114/71 mmHg (04/27 0540) SpO2:  [94 %-100 %] 94 % (04/27 0540) Weight:  [86.183 kg (190 lb)] 86.183 kg (190 lb) (04/26 1748)  Intake/Output from previous day: 04/26 0701 - 04/27 0700 In: 4501.3 [P.O.:780; I.V.:3171.3; IV Piggyback:550] Out: 1710 [Urine:810; Blood:900] Intake/Output this shift:     Basename 12/11/11 0452  HGB 8.7*    Basename 12/11/11 0452  WBC 10.2  RBC 2.86*  HCT 25.1*  PLT 174    Basename 12/11/11 0452  NA 136  K 3.9  CL 104  CO2 25  BUN 29*  CREATININE 0.97  GLUCOSE 121*  CALCIUM 8.3*    Basename 12/11/11 0452  LABPT --  INR 1.12    Neurologically intact  Assessment/Plan: 1 Day Post-Op Procedure(s) (LRB): TOTAL HIP ARTHROPLASTY ANTERIOR APPROACH (Right) Up with therapy  [name] 12/11/2011, 9:01 AM

## 2011-12-11 NOTE — Op Note (Signed)
[name], [name]                 ACCOUNT NO.:  [id number]  MEDICAL RECORD NO.:  [id number]  LOCATION:  35                         FACILITY:  [facility]  PHYSICIAN:  Lind Guest. Ninfa Linden, M.D.DATE OF BIRTH:  1945-08-17  DATE OF PROCEDURE:  12/10/2011 DATE OF DISCHARGE:                              OPERATIVE REPORT   PREOPERATIVE DIAGNOSIS:  End-stage arthritis, degenerative disease, right hip.  POSTOPERATIVE DIAGNOSIS:  End-stage arthritis, degenerative disease, right hip.  PROCEDURE:  Right total hip arthroplasty through direct anterior approach.  IMPLANTS:  DePuy Sector Gription acetabular component size 50, size 32 +4 neutral polyethylene liner, Corail femoral component size 11 with standard offset, size 32 +1 ceramic hip ball.  SURGEON:  Lind Guest. Ninfa Linden, M.D.  ASSISTANT:  Epimenio Foot, P.A. who was present and assistance was needed throughout the entire case.  ANESTHESIA:  General.  BLOOD LOSS:  800 mL.  COMPLICATIONS:  None.  INDICATIONS:  Cassidy Garcia is 65 year old female with known end-stage arthritis of her right hip.  This has become quite debilitating to her, and has greatly affected her activities of daily living.  She is at the point where she wished to proceed with a total hip arthroplasty given the failure of conservative treatment.  X-rays also showed end-stage arthritis with bone on bone wear.  She understands the increased risk of acute blood loss anemia, DVT and PE, and does wish to proceed with surgery.  DESCRIPTION OF PROCEDURE:  After informed consent was obtained, the appropriate right hip was marked.  She was brought to the operating room.  General anesthesia was obtained while she was on the stretcher. Foley catheter was placed as well as traction boots placed on her feet. She was then placed supine on the Hana table with a perineal post in place and both feet in traction boots and traction devices, but no traction applied.  Her  right hip was then prepped and draped with DuraPrep and sterile drapes.  A time-out was called and she was identified as the correct patient, correct right hip.  I then made an incision just distal and posterior to the anterosuperior iliac spine and carried this obliquely down the leg.  I dissected down to the tensor fascia lata and the tensor fascia lata was divided obliquely.  I then placed retractors around the lateral neck and up underneath the rectus femoris and the medial neck.  I divided the hip capsule and placed Cobra retractors within the hip capsule.  Then I made a femoral neck cut with an oscillating saw just proximal to the lesser trochanter.  I placed a corkscrew guide into the femoral head and removed the femoral head in its entirety.  I then cleaned the acetabulum of debris and placed a medium bent Hohmann medially and a Cobb retractor laterally so I could gain access to the acetabulum.  I cleaned the acetabulum of debris including remnants of the labrum under direct visualization.  I then began reaming from a size 42 reamer in 2 mm increments all the way up to a size 50 with a size 50 placed under direct visualization and fluoroscopy.  I got my final version inclination and depth of  reaming. I then placed a real size 52 acetabular component, and the whole remnant of the guide as well as the screw was found to be slightly loose, which did require reaming.  Once this was secure, the neutral +4 polyethylene liner was placed.  Attention was then turned to the femur with the leg externally rotated to 90 degrees, it was extended and adducted to allow access to the femoral canal.  I used a box cutting guide to open up the femoral canal and used a rongeur to lateralize.  I then began broaching from a size 8 broach all the way to a 11.  11 was felt to be stable, so I placed a standard neck and a 32 +1 hip ball and reduced this into the acetabulum.  Her leg lengths were measured to  be near equal.  It was stable on internal and external rotation with minimal shuck.  I then removed the trial components and placed the real HA coated femoral component from Corail and DePuy, size 11 with standard offset and collar.  I placed the real 32 +1 ceramic hip ball and reduced this back into the acetabulum.  Again, it was stable.  We then copiously irrigated the tissue with normal saline solution.  I closed the joint capsule with #1 Ethibond suture followed by 2-0 Vicryl on the subcutaneous tissue and interrupted staples on the skin.  A well-padded sterile dressing was applied.  She was taken off the Hana table, awakened, extubated and taken to the recovery room in stable condition.  All final counts were correct.  There were no complications noted.     Lind Guest. Ninfa Linden, M.D.     CYB/MEDQ  D:  12/10/2011  T:  12/11/2011  Job:  [id number]

## 2011-12-12 LAB — CBC
HCT: 21.2 % — ABNORMAL LOW (ref 36.0–46.0)
Hemoglobin: 7.4 g/dL — ABNORMAL LOW (ref 12.0–15.0)
RBC: 2.36 MIL/uL — ABNORMAL LOW (ref 3.87–5.11)
WBC: 8 10*3/uL (ref 4.0–10.5)

## 2011-12-12 LAB — PROTIME-INR
INR: 1.24 (ref 0.00–1.49)
Prothrombin Time: 15.9 seconds — ABNORMAL HIGH (ref 11.6–15.2)

## 2011-12-12 NOTE — Progress Notes (Signed)
Physical Therapy Treatment Patient Details Name: ELENA MARMOR MRN: [id number] DOB: 1945/12/21 Today's Date: 12/12/2011 Time: 1417-1430 PT Time Calculation (min): 13 min  PT Assessment / Plan / Recommendation Comments on Treatment Session  Pt ltd by headache and fatigue - will follow in am    Follow Up Recommendations  Home health PT    Equipment Recommendations  3 in 1 bedside comode    Frequency 7X/week   Plan Discharge plan remains appropriate;Frequency remains appropriate    Precautions / Restrictions Precautions Precautions: Anterior Hip;None Restrictions Weight Bearing Restrictions: No RLE Weight Bearing: Weight bearing as tolerated Other Position/Activity Restrictions: WBAT   Pertinent Vitals/Pain BP with activity 114/62    Mobility  Bed Mobility Bed Mobility: Supine to Sit;Sitting - Scoot to Edge of Bed Supine to Sit: 4: Min guard;HOB elevated;With rails (45) Sitting - Scoot to Edge of Bed: 5: Supervision;With rail Sit to Supine: 4: Min assist;HOB flat Details for Bed Mobility Assistance: assist for R LE Transfers Transfers: Sit to Stand;Stand to Sit Sit to Stand: 4: Min assist;With armrests;With upper extremity assist;From chair/3-in-1 Stand to Sit: 4: Min guard;With upper extremity assist;To chair/3-in-1;With armrests Details for Transfer Assistance: cues for LE management and use of UEs Ambulation/Gait Ambulation/Gait Assistance: 4: Min assist Ambulation Distance (Feet): 6 Feet Assistive device: Rolling walker Ambulation/Gait Assistance Details: cues for sequence, posture and position from RW Gait Pattern: Step-to pattern;Antalgic;Decreased stance time - right;Decreased step length - left    Exercises     PT Goals Acute Rehab PT Goals PT Goal Formulation: With patient Time For Goal Achievement: 12/18/11 Potential to Achieve Goals: Good Pt will go Supine/Side to Sit: with supervision Pt will go Sit to Supine/Side: with supervision PT Goal: Sit to  Supine/Side - Progress: Progressing toward goal Pt will go Sit to Stand: with supervision PT Goal: Sit to Stand - Progress: Progressing toward goal Pt will go Stand to Sit: with supervision PT Goal: Stand to Sit - Progress: Progressing toward goal Pt will Ambulate: 51 - 150 feet;with supervision;with rolling walker PT Goal: Ambulate - Progress: Progressing toward goal  Visit Information  Last PT Received On: 12/12/11 Assistance Needed: +2 (+2 saftey 2* decreased BP with activity this am)    Subjective Data  Subjective: Pt c/o headache, fatigue and mild dizziness with activity Patient Stated Goal: Resume previous lifestyle with decreased pain   Cognition  Overall Cognitive Status: Appears within functional limits for tasks assessed/performed Arousal/Alertness: Awake/alert Orientation Level: Appears intact for tasks assessed Behavior During Session: [facility] for tasks performed    Balance     End of Session PT - End of Session Equipment Utilized During Treatment: Gait belt Activity Tolerance: Patient limited by fatigue Patient left: in bed;with call bell/phone within reach Nurse Communication: Other (comment) (Pt c/o dizziness and BP at 79/50 following trip to bathroom)    Floris Neuhaus 12/12/2011, 2:43 PM

## 2011-12-12 NOTE — Evaluation (Signed)
Occupational Therapy Evaluation Patient Details Name: Cassidy Garcia MRN: [id number] DOB: Sep 06, 1945 Today's Date: 12/12/2011 Time: [id number] OT Time Calculation (min): 49 min  OT Assessment / Plan / Recommendation Clinical Impression  Pt admitted for R anterior hip replacement.  Pt is progressing steadily in mobility and ADL.  Recommend 3 in 1 for home.  No further OT needs.  Will rely on caregivers for assistance with ADL and IADL until she is able to perform independently.      OT Assessment  Patient does not need any further OT services    Follow Up Recommendations  Supervision/Assistance - 24 hour    Equipment Recommendations  3 in 1 bedside comode          Precautions / Restrictions Precautions Precautions: Anterior Hip;None Restrictions Weight Bearing Restrictions: No RLE Weight Bearing: Weight bearing as tolerated Other Position/Activity Restrictions: WBAT        ADL  Eating/Feeding: Performed;Independent Where Assessed - Eating/Feeding: Chair Grooming: Performed;Set up Where Assessed - Grooming: Supported sitting Upper Body Bathing: Simulated;Set up Where Assessed - Upper Body Bathing: Sitting, bed Lower Body Bathing: Simulated;Moderate assistance Where Assessed - Lower Body Bathing: Sitting, bed;Sit to stand from bed Upper Body Dressing: Performed;Set up Where Assessed - Upper Body Dressing: Sitting, bed Lower Body Dressing: Performed;Moderate assistance Where Assessed - Lower Body Dressing: Sitting, bed;Sit to stand from bed Toilet Transfer: Simulated;Minimal assistance;Other (comment) (min guard) Toilet Transfer Method: Ambulating Toileting - Hygiene: Simulated;Modified independent Where Assessed - Toileting Hygiene: Sit to stand from 3-in-1 or toilet Equipment Used: Gait belt;Rolling walker Ambulation Related to ADLs: Ambulated with min guard assist. ADL Comments: Nearly able to reach R foot in sitting.  Educated pt in [place] for LB bathing and dressing.  Will  rely on family to assist until she is able to perform independently.  Recommended pt practice tub transfer with HHPT prior to showering at home.  Discussed option of tub seat, pt preferring to stand and hold to grab bars around her tub.        Visit Information  Last OT Received On: 12/12/11 Assistance Needed: +1    Subjective Data  Subjective: " I had to cancel a cruise in [place] for this surgery." Patient Stated Goal: Return to PLOF.   Prior Functioning  Home Living Lives With: Alone Available Help at Discharge: Family Type of Home: House Home Access: Stairs to enter Technical brewer of Steps: 2 Entrance Stairs-Rails: None Home Layout: Able to live on main level with bedroom/bathroom Bathroom Shower/Tub: Chiropodist: Standard Home Adaptive Equipment: Grab bars in shower Prior Function Level of Independence: Independent Able to Take Stairs?: Yes Driving: Yes Vocation: Retired Corporate investment banker: No difficulties Dominant Hand: Right    Cognition  Overall Cognitive Status: Appears within functional limits for tasks assessed/performed Arousal/Alertness: Awake/alert Orientation Level: Appears intact for tasks assessed Behavior During Session: [facility] for tasks performed    Extremity/Trunk Assessment Right Upper Extremity Assessment RUE ROM/Strength/Tone: Within functional levels Left Upper Extremity Assessment LUE ROM/Strength/Tone: Within functional levels   Mobility Bed Mobility Bed Mobility: Supine to Sit;Sitting - Scoot to Edge of Bed Supine to Sit: 4: Min guard;HOB elevated;With rails (45) Sitting - Scoot to Edge of Bed: 5: Supervision;With rail Details for Bed Mobility Assistance: assist for R LE Transfers Sit to Stand: 4: Min guard;With upper extremity assist;With armrests;From bed Stand to Sit: With upper extremity assist;4: Min guard;To chair/3-in-1 Details for Transfer Assistance: cues for hand placement and right LE placement  with transfers.  End of Session OT - End of Session Activity Tolerance: Patient tolerated treatment well Patient left: in chair;with call bell/phone within reach   Malka So 12/12/2011, 11:52 AM [phone]

## 2011-12-12 NOTE — Progress Notes (Signed)
ANTICOAGULATION CONSULT NOTE - Initial Consult  Pharmacy Consult for Warfarin Indication: VTE Prophylaxis s/p R THA  Allergies  Allergen Reactions  . Codeine     REACTION: Lightheaded, nauses  . Demerol (Meperidine Hcl) Other (See Comments)    Dizziness     Patient Measurements: Height: 5\' 5"  (165.1 cm) Weight: 190 lb (86.183 kg) IBW/kg (Calculated) : 57  12/07/11: ht= 165cm, wt=86.2kg  Vital Signs: Temp: 97.5 F (36.4 C) (04/28 0535) Temp src: Oral (04/28 0535) BP: 104/64 mmHg (04/28 0535) Pulse Rate: 73  (04/28 0535)  Labs:  Basename 12/12/11 0436 12/11/11 0452  HGB 7.4* 8.7*  HCT 21.2* 25.1*  PLT 124* 174  APTT -- --  LABPROT 15.9* 14.6  INR 1.24 1.12  HEPARINUNFRC -- --  CREATININE -- 0.97  CKTOTAL -- --  CKMB -- --  TROPONINI -- --   Estimated Creatinine Clearance: 61.9 ml/min (by C-G formula based on Cr of 0.97).  Medical History: Past Medical History  Diagnosis Date  . Diverticulosis   . Hemorrhoids   . Hyperlipidemia   . GERD (gastroesophageal reflux disease)   . Hypertension   . Colon polyps     tubular adenoma  . Skin cancer   . Hypothyroidism   . Asthma   . Depression   . Arthritis   . Shortness of breath     with exertion on occasion  . Constipation     Medications:  Scheduled:     . amitriptyline  75 mg Oral QHS  . coumadin book   Does not apply Once  . docusate sodium  100 mg Oral BID  . ferrous sulfate  325 mg Oral TID PC  . hydrochlorothiazide  25 mg Oral Daily  . levothyroxine  50 mcg Oral Q breakfast  . losartan  100 mg Oral Daily  . morphine   Intravenous Q4H  . pantoprazole  40 mg Oral Q1200  . simvastatin  10 mg Oral q1800  . verapamil  240 mg Oral QHS  . warfarin  5 mg Oral ONCE-1800  . warfarin   Does not apply Once  . Warfarin - Pharmacist Dosing Inpatient   Does not apply q1800   Infusions:     . sodium chloride 20 mL/hr at 12/11/11 2007   PRN: acetaminophen, acetaminophen, albuterol, alum & mag  hydroxide-simeth, diphenhydrAMINE, diphenhydrAMINE, diphenhydrAMINE, Fluticasone-Salmeterol, HYDROcodone-acetaminophen, menthol-cetylpyridinium, methocarbamol (ROBAXIN) IV, methocarbamol, metoCLOPramide (REGLAN) injection, metoCLOPramide, morphine, naloxone, ondansetron (ZOFRAN) IV, ondansetron, oxyCODONE, phenol, sodium chloride, zolpidem  Assessment:  66 yo F with end-stage osteoarthritis, now s/p R THA during initation process.  INR low but trending up during initiation process  Post op Hgb continues to decrease - per RN there is no active bleeding, Spoke with Dr. Sharol Given and explained trend of decreased Hgb, decision to hold coumadin dose for tonight and watch Hgb.    Goal of Therapy:  INR 2-3   Plan:  1) No coumadin 2) Follow Daily PT/INR 3.) *f/u AM Hgb*  Cassidy Garcia, Cassidy Garcia PharmD 3:10 PM 12/12/2011

## 2011-12-12 NOTE — Progress Notes (Signed)
Physical Therapy Treatment Patient Details Name: Cassidy Garcia MRN: [id number] DOB: 10-Apr-1946 Today's Date: 12/12/2011 Time: [id number] PT Time Calculation (min): 28 min  PT Assessment / Plan / Recommendation Comments on Treatment Session  Pt with c/o dizziness following ambulation - BP 79/50; HR 111; O2 100% - RN advised    Follow Up Recommendations  Home health PT    Equipment Recommendations  3 in 1 bedside comode    Frequency 7X/week   Plan Discharge plan remains appropriate;Frequency remains appropriate    Precautions / Restrictions Precautions Precautions: Anterior Hip;None Restrictions Weight Bearing Restrictions: No RLE Weight Bearing: Weight bearing as tolerated Other Position/Activity Restrictions: WBAT   Pertinent Vitals/Pain BP after ambulating and with c/o dizziness 79/50 - RN aware    Mobility  Bed Mobility Bed Mobility: Supine to Sit;Sitting - Scoot to Edge of Bed Supine to Sit: 4: Min guard;HOB elevated;With rails (45) Sitting - Scoot to Edge of Bed: 5: Supervision;With rail Details for Bed Mobility Assistance: assist for R LE Transfers Transfers: Sit to Stand;Stand to Sit Sit to Stand: 4: Min assist;With armrests;With upper extremity assist;From chair/3-in-1 Stand to Sit: 4: Min guard;With upper extremity assist;To chair/3-in-1;With armrests Details for Transfer Assistance: cues for LE management and use of UEs Ambulation/Gait Ambulation/Gait Assistance: 4: Min assist Ambulation Distance (Feet): 65 Feet (65' and 2 x 23') Assistive device: Rolling walker Ambulation/Gait Assistance Details: cues for posture, position from RW and sequence Gait Pattern: Step-to pattern;Antalgic;Decreased stance time - right;Decreased step length - left    Exercises     PT Goals Acute Rehab PT Goals PT Goal Formulation: With patient Time For Goal Achievement: 12/18/11 Potential to Achieve Goals: Good Pt will go Sit to Stand: with supervision PT Goal: Sit to Stand -  Progress: Progressing toward goal Pt will go Stand to Sit: with supervision PT Goal: Stand to Sit - Progress: Progressing toward goal Pt will Ambulate: 51 - 150 feet;with supervision;with rolling walker PT Goal: Ambulate - Progress: Progressing toward goal  Visit Information  Last PT Received On: 12/12/11 Assistance Needed: +1    Subjective Data  Subjective: Pt with c/o dizziness after ambulation Patient Stated Goal: Resume previous lifestyle with decreased pain   Cognition  Overall Cognitive Status: Appears within functional limits for tasks assessed/performed Arousal/Alertness: Awake/alert Orientation Level: Appears intact for tasks assessed Behavior During Session: [facility] for tasks performed    Balance     End of Session PT - End of Session Activity Tolerance: Patient tolerated treatment well Patient left: in chair;with call bell/phone within reach;with family/visitor present Nurse Communication: Other (comment) (Pt c/o dizziness and BP at 79/50 following trip to bathroom)    Ronnett Pullin 12/12/2011, 12:42 PM

## 2011-12-12 NOTE — Progress Notes (Signed)
CM spoke with pt concerning dc planning. Pt offered choice for [facility]. Per pt choice Arville Go to provide [facility] services. Pt will need HHRN for coumadin management. Pt request RW,3n1. [facility] rep Lazarus Gowda notified of dme referral. DME delivery scheduled to room prior to discharge. Pt's friend present at bedside to assist in home care.  Arlean Hopping [phone]

## 2011-12-12 NOTE — Progress Notes (Signed)
Subjective: 2 Days Post-Op Procedure(s) (LRB): TOTAL HIP ARTHROPLASTY ANTERIOR APPROACH (Right) Patient reports pain as mild.    Objective: Vital signs in last 24 hours: Temp:  [97.5 F (36.4 C)-98.2 F (36.8 C)] 97.5 F (36.4 C) (04/28 0535) Pulse Rate:  [73-89] 73  (04/28 0535) Resp:  [15-23] 18  (04/28 0800) BP: (104-111)/(64-71) 104/64 mmHg (04/28 0535) SpO2:  [93 %-100 %] 100 % (04/28 0800)  Intake/Output from previous day: 04/27 0701 - 04/28 0700 In: 2042.7 [P.O.:960; I.V.:1082.7] Out: 1050 [Urine:1050] Intake/Output this shift:     Basename 12/12/11 0436 12/11/11 0452  HGB 7.4* 8.7*    Basename 12/12/11 0436 12/11/11 0452  WBC 8.0 10.2  RBC 2.36* 2.86*  HCT 21.2* 25.1*  PLT 124* 174    Basename 12/11/11 0452  NA 136  K 3.9  CL 104  CO2 25  BUN 29*  CREATININE 0.97  GLUCOSE 121*  CALCIUM 8.3*    Basename 12/12/11 0436 12/11/11 0452  LABPT -- --  INR 1.24 1.12    Neurologically intact  Assessment/Plan: 2 Days Post-Op Procedure(s) (LRB): TOTAL HIP ARTHROPLASTY ANTERIOR APPROACH (Right) Up with therapy D/C IV fluids  Cassidy Garcia V 12/12/2011, 9:09 AM

## 2011-12-13 LAB — CBC
HCT: 20.9 % — ABNORMAL LOW (ref 36.0–46.0)
Hemoglobin: 7.1 g/dL — ABNORMAL LOW (ref 12.0–15.0)
RBC: 2.35 MIL/uL — ABNORMAL LOW (ref 3.87–5.11)
WBC: 7.8 10*3/uL (ref 4.0–10.5)

## 2011-12-13 LAB — PROTIME-INR
INR: 1.21 (ref 0.00–1.49)
Prothrombin Time: 15.6 seconds — ABNORMAL HIGH (ref 11.6–15.2)

## 2011-12-13 LAB — PREPARE RBC (CROSSMATCH)

## 2011-12-13 MED ORDER — METHOCARBAMOL 500 MG PO TABS: 500.0000 mg | ORAL_TABLET | Freq: Four times a day (QID) | ORAL | Status: AC | PRN

## 2011-12-13 MED ORDER — FERROUS SULFATE 325 (65 FE) MG PO TABS: 325.0000 mg | ORAL_TABLET | Freq: Three times a day (TID) | ORAL | Status: DC

## 2011-12-13 MED ORDER — OXYCODONE-ACETAMINOPHEN 5-325 MG PO TABS: 1.0000 | ORAL_TABLET | ORAL | Status: AC | PRN

## 2011-12-13 MED ORDER — WARFARIN SODIUM 5 MG PO TABS: 5.0000 mg | ORAL_TABLET | Freq: Every day | ORAL | Status: DC

## 2011-12-13 NOTE — Progress Notes (Signed)
Patient ID: Cassidy Garcia, female   DOB: [birth year], 65 y.o.   MRN: [id number] Patient with acute blood loss anemia.  Will transfuse 1 unit and then d/c to home today because doing well otherwise.

## 2011-12-13 NOTE — Progress Notes (Signed)
Physical Therapy Treatment Patient Details Name: Cassidy Garcia MRN: [id number] DOB: [birth year] Today's Date: 12/13/2011 Time: 1320-1330 PT Time Calculation (min): 10 min 1 gt  PT Assessment / Plan / Recommendation Comments on Treatment Session  Pt's sister was educated on proper technique for safety on how to asssit and guard pt on level surface and stairs with RW. Hand out was given, questions were answered, and pt and sister were instructed to use ice after exercises/activity for pain/swelling.    Follow Up Recommendations  Home health PT    Equipment Recommendations       Frequency     Plan Discharge plan remains appropriate    Precautions / Restrictions   Pertinent Vitals/Pain    Mobility  Bed Mobility Bed Mobility: Supine to Sit;Sitting - Scoot to [employer] of Bed Sitting - Scoot to [place] of Bed: 4: Min guard (assist with R LE) Details for Bed Mobility Assistance: assist with R LE Transfers Transfers: Sit to Stand;Stand to Sit Sit to Stand: 4: Min guard Stand to Sit: 4: Min guard Details for Transfer Assistance: educated pts sister on where to stand, how to assist pt sit-->stand, stand-->sit Ambulation/Gait Ambulation/Gait Assistance: 4: Min Herbalist (Feet): 20 Feet Assistive device: Rolling walker Ambulation/Gait Assistance Details: vc given to pts sister for RW ambulation assistance- educated sister on where to stand and how to guard for safety Gait Pattern: Step-to pattern;Decreased step length - left General Gait Details: pt slowed due to fatigue  Stairs: Yes Stairs Assistance: 1: +2 Total assist Stairs Assistance Details (indicate cue type and reason): educated sister through demonstration for technique and pattern for safety using RW with no rails. Had sister practice with PT and then with pt Stair Management Technique: No rails;Backwards;Forwards;With walker Number of Stairs: 2  Wheelchair Mobility Wheelchair Mobility: No        PT Goals Acute  Rehab PT Goals PT Goal Formulation: With patient Pt will go Supine/Side to Sit: with supervision PT Goal: Supine/Side to Sit - Progress: Progressing toward goal Pt will go Sit to Stand: with supervision PT Goal: Sit to Stand - Progress: Progressing toward goal Pt will go Stand to Sit: with supervision PT Goal: Stand to Sit - Progress: Progressing toward goal Pt will Ambulate: 51 - 150 feet;with supervision;with rolling walker PT Goal: Ambulate - Progress: Progressing toward goal Pt will Go Up / Down Stairs: 1-2 stairs;with min assist;with least restrictive assistive device PT Goal: Up/Down Stairs - Progress: Progressing toward goal  Visit Information  Last PT Received On: 12/13/11 Assistance Needed: +1    Subjective Data  Subjective: Pt sitting in chair with ice on her hip with sister present   Cognition       Balance     End of Session PT - End of Session Equipment Utilized During Treatment: Gait belt Activity Tolerance: Patient tolerated treatment well Patient left: in chair;with call bell/phone within reach;with family/visitor present Nurse Communication: Other (comment) (pt done with PT and ready for d/c)    [place], [place], SPTA 12/13/2011, 2:07 PM  Rica Koyanagi  PTA [facility]  Acute  Rehab Pager     [phone]

## 2011-12-13 NOTE — Progress Notes (Signed)
Physical Therapy Treatment Patient Details Name: Cassidy Garcia MRN: [id number] DOB: 11-26-1945 Today's Date: 12/13/2011 Time: 1100-1135 PT Time Calculation (min): 35 min  PT Assessment / Plan / Recommendation Comments on Treatment Session  Pt just finishing blood transfuse during treatment.  Pt fatigued, c/o 4/10 pain with exercises and ambulation.  Educated pt. on stairs with her personal (new) RW, and had pt ambulate with RW 20 ft back to chair and performed sitting exercises.  Pt too fatigued to do standing exercises.  Pts sister is coming to pick her up and the plan is to educate them both on stairs with RW when she arrives. Pt. was given a copy of HEP and instructed to ice her hip after activity to help with pain/swelling.    Follow Up Recommendations  Home health PT    Equipment Recommendations       Frequency     Plan Discharge plan remains appropriate    Precautions / Restrictions     Pertinent Vitals/Pain 4/10 R hip pain with ambulation/exercises    Mobility  Bed Mobility Bed Mobility: Supine to Sit;Sitting - Scoot to [employer] of Bed Sitting - Scoot to [place] of Bed: 4: Min guard (assist with R LE) Details for Bed Mobility Assistance: assist with R LE Transfers Transfers: Sit to Stand;Stand to Sit Sit to Stand: 4: Min guard;From elevated surface;From bed;From chair/3-in-1;From toilet;With upper extremity assist Stand to Sit: 4: Min guard;To chair/3-in-1;To toilet;With upper extremity assist Details for Transfer Assistance: cues for UE placement Ambulation/Gait Ambulation/Gait Assistance: 4: Min guard Ambulation Distance (Feet): 20 Feet Assistive device: Rolling walker Ambulation/Gait Assistance Details: vc position from RW and LE sequencing Gait Pattern: Step-to pattern;Decreased step length - left;Decreased stance time - right General Gait Details: pt slowed due to fatigue  Stairs: Yes Stairs Assistance: 1: +2 Total assist Stairs Assistance Details (indicate cue type and  reason): 100% VC needed for education on technique and pattern for safety using RW with no rails Stair Management Technique: No rails;Backwards;Forwards;With walker Number of Stairs: 2  Wheelchair Mobility Wheelchair Mobility: No    Exercises Total Joint Exercises Ankle Circles/Pumps: AROM;Both;10 reps;Seated Quad Sets: AROM;Right;10 reps;Seated Short Arc Quad: AROM;Right;10 reps;Seated Heel Slides: AROM;Strengthening;Right;10 reps;Supine Long Arc Quad: AROM;Right;10 reps;Seated   PT Goals Acute Rehab PT Goals PT Goal Formulation: With patient Pt will go Supine/Side to Sit: with supervision PT Goal: Supine/Side to Sit - Progress: Progressing toward goal Pt will go Sit to Stand: with supervision PT Goal: Sit to Stand - Progress: Progressing toward goal Pt will go Stand to Sit: with supervision PT Goal: Stand to Sit - Progress: Progressing toward goal Pt will Ambulate: 51 - 150 feet;with supervision;with rolling walker PT Goal: Ambulate - Progress: Progressing toward goal Pt will Go Up / Down Stairs: 1-2 stairs;with min assist;with least restrictive assistive device PT Goal: Up/Down Stairs - Progress: Progressing toward goal  Visit Information  Last PT Received On: 12/13/11 Assistance Needed: +1    Subjective Data  Subjective: Pt supine in bed just finishing up 1 unit blood transfuse.  Pt stated no pain sitting, but 4/10 with ambulation and exercises   Cognition       Balance     End of Session PT - End of Session Equipment Utilized During Treatment: Gait belt Activity Tolerance: Patient limited by fatigue Patient left: in chair;with call bell/phone within reach Nurse Communication: Other (comment) (nsg to page when sister arrives to educate her on stairs )    Guerry Minors, [place] 12/13/2011, 1:54  PM  Rica Koyanagi  PTA [facility]  Acute  Rehab Pager     [phone]

## 2011-12-13 NOTE — Progress Notes (Signed)
CARE MANAGEMENT NOTE 12/13/2011  Patient:  Cassidy Garcia, Cassidy Garcia   Account Number:  [id number]  Date Initiated:  12/12/2011  Documentation initiated by:  DAVIS,TYMEEKA  Subjective/Objective Assessment:   66 yo female admitted s/p right hip replacement.     Action/Plan:   Home when stable   Anticipated DC Date:  12/13/2011   Anticipated DC Plan:  [place] referral  NA      DC Planning Services  CM consult      PAC Choice  [place]   Choice offered to / List presented to:  C-1 Patient   DME arranged  3-N-1  Vassie Moselle      DME agency  [place] arranged  HH-2 PT  HH-1 RN      [place]   Status of service:  Completed, signed off Medicare Important Message given?  NA - LOS <3 / Initial given by admissions (If response is "NO", the following Medicare IM given date fields will be blank) Date Medicare IM given:   Date Additional Medicare IM given:    Discharge Disposition:  [place]  Per UR Regulation:    If discussed at Long Length of Stay Meetings, dates discussed:    Comments:  12/13/2011 [place] BSN CCM [phone] Plans are for discharge today. DME has been delivered to patient's room.Arville Go [facility] will start services tomorrow 12/14/2011.

## 2011-12-13 NOTE — Discharge Summary (Signed)
Patient ID: Cassidy Garcia MRN: [id number] DOB/AGE: 1945/09/18 66 y.o.  Admit date: 12/10/2011 Discharge date: 12/13/2011  Admission Diagnoses:  Principal Problem:  *Degenerative arthritis of hip   Discharge Diagnoses:  Same  Past Medical History  Diagnosis Date  . Diverticulosis   . Hemorrhoids   . Hyperlipidemia   . GERD (gastroesophageal reflux disease)   . Hypertension   . Colon polyps     tubular adenoma  . Skin cancer   . Hypothyroidism   . Asthma   . Depression   . Arthritis   . Shortness of breath     with exertion on occasion  . Constipation     Surgeries: Procedure(s): TOTAL HIP ARTHROPLASTY ANTERIOR APPROACH on 12/10/2011   Consultants:    Discharged Condition: Improved  Hospital Course: Cassidy Garcia is an 66 y.o. female who was admitted 12/10/2011 for operative treatment ofDegenerative arthritis of hip. Patient has severe unremitting pain that affects sleep, daily activities, and work/hobbies. After pre-op clearance the patient was taken to the operating room on 12/10/2011 and underwent  Procedure(s): TOTAL HIP ARTHROPLASTY ANTERIOR APPROACH.    Patient was given perioperative antibiotics: Anti-infectives     Start     Dose/Rate Route Frequency Ordered Stop   12/10/11 1800   ceFAZolin (ANCEF) IVPB 1 g/50 mL premix        1 g 100 mL/hr over 30 Minutes Intravenous Every 6 hours 12/10/11 1617 12/11/11 0649   12/10/11 1300   vancomycin (VANCOCIN) IVPB 1000 mg/200 mL premix        1,000 mg 200 mL/hr over 60 Minutes Intravenous  Once 12/10/11 1209 12/10/11 1231   12/10/11 1034   ceFAZolin (ANCEF) IVPB 2 g/50 mL premix  Status:  Discontinued        2 g 100 mL/hr over 30 Minutes Intravenous 60 min pre-op 12/10/11 1034 12/10/11 1209           Patient was given sequential compression devices, early ambulation, and chemoprophylaxis to prevent DVT.  Patient benefited maximally from hospital stay and there were no complications.  Transfused 1 unit of blood  due to acute blood loss anemia with symptoms.  Recent vital signs: Patient Vitals for the past 24 hrs:  BP Temp Temp src Pulse Resp SpO2  12/13/11 0541 100/64 mmHg 98.1 F (36.7 C) Oral 70  16  96 %  01-02-2012 2107 114/71 mmHg 98.1 F (36.7 C) Oral 91  16  99 %  01-02-2012 1420 97/57 mmHg 98.3 F (36.8 C) Oral 77  16  94 %  Jan 02, 2012 0800 - - - - 18  100 %     Recent laboratory studies:  Basename 12/13/11 0415 Jan 02, 2012 0436 12/11/11 0452  WBC 7.8 8.0 --  HGB 7.1* 7.4* --  HCT 20.9* 21.2* --  PLT 138* 124* --  NA -- -- 136  K -- -- 3.9  CL -- -- 104  CO2 -- -- 25  BUN -- -- 29*  CREATININE -- -- 0.97  GLUCOSE -- -- 121*  INR 1.21 1.24 --  CALCIUM -- -- 8.3*     Discharge Medications:   Medication List  As of 12/13/2011  7:01 AM   STOP taking these medications         meloxicam 7.5 MG tablet         TAKE these medications         ADVAIR DISKUS 250-50 MCG/DOSE Aepb   Generic drug: Fluticasone-Salmeterol   Inhale 1 puff into the  lungs every 12 (twelve) hours as needed. FOR RESPIRATORY ISSUES      amitriptyline 25 MG tablet   Commonly known as: ELAVIL   75 mg at bedtime.      aspirin 81 MG tablet   Take 81 mg by mouth daily with breakfast.      Calcium Carb-Cholecalciferol 600-500 MG-UNIT Caps   Take 1 capsule by mouth 2 (two) times daily.      docusate sodium 100 MG capsule   Commonly known as: COLACE   Take 100 mg by mouth 2 (two) times daily.      ferrous sulfate 325 (65 FE) MG tablet   Take 1 tablet (325 mg total) by mouth 3 (three) times daily after meals.      Fish Oil 1200 MG Caps   Take 1 capsule by mouth 2 (two) times daily.      glucosamine-chondroitin 500-400 MG tablet   Take 1 tablet by mouth 2 (two) times daily.      levothyroxine 50 MCG tablet   Commonly known as: SYNTHROID, LEVOTHROID   Take 50 mcg by mouth daily with breakfast.      losartan-hydrochlorothiazide 100-25 MG per tablet   Commonly known as: HYZAAR   Take 1 tablet by mouth  daily with breakfast.      methocarbamol 500 MG tablet   Commonly known as: ROBAXIN   Take 1 tablet (500 mg total) by mouth every 6 (six) hours as needed.      mometasone 50 MCG/ACT nasal spray   Commonly known as: NASONEX   Place 2 sprays into the nose daily as needed. FOR ALLERGIES      MULTIVITAMIN PO   Take 1 tablet by mouth daily with breakfast.      omeprazole 20 MG capsule   Commonly known as: PRILOSEC   Take 20 mg by mouth daily with breakfast.      oxyCODONE-acetaminophen 5-325 MG per tablet   Commonly known as: PERCOCET   Take 1-2 tablets by mouth every 4 (four) hours as needed for pain.      pravastatin 20 MG tablet   Commonly known as: PRAVACHOL   Take 20 mg by mouth daily with breakfast.      traMADol 50 MG tablet   Commonly known as: ULTRAM   Take 50 mg by mouth every 6 (six) hours as needed. FOR PAIN      VENTOLIN HFA 108 (90 BASE) MCG/ACT inhaler   Generic drug: albuterol   Inhale 2 puffs into the lungs every 6 (six) hours as needed. FOR SHORTNESS OF BREATH      verapamil 240 MG CR tablet   Commonly known as: CALAN-SR   Take 240 mg by mouth at bedtime.      warfarin 5 MG tablet   Commonly known as: COUMADIN   Take 1 tablet (5 mg total) by mouth daily.            Diagnostic Studies: Dg Hip Complete Right  12/10/2011  *RADIOLOGY REPORT*  Clinical Data: Right hip replacement.  RIGHT HIP - COMPLETE 2+ VIEW  Comparison: None.  Findings: Two AP C-arm views of the right hip demonstrate a total hip prosthesis in satisfactory position and alignment.  No fracture or dislocation seen on these views.  Linear metallic density overlying the right inferior pubic ramus.  IMPRESSION:  1.  Satisfactory postoperative appearance of a right total hip prosthesis. 2.  Linear metallic density overlying the inferior pubic ramus on the right.  This  could be internal or external to the patient.  Original Report Authenticated By: Gerald Stabs, M.D.   Dg Pelvis  Portable  12/10/2011  *RADIOLOGY REPORT*  Clinical Data: Status post right hip arthroplasty.  PORTABLE PELVIS  Comparison: None.  Findings: A single AP view of the pelvis demonstrates the patient is status post right total hip arthroplasty.  The acetabular component is well-seated.  Hip is located.  The distal aspect of the femoral prosthesis is not imaged. Mild degenerative changes are noted at the left hip.  Moderate degenerative changes are noted in the lower lumbar spine.  IMPRESSION:  1.  Incomplete imaging of the right femoral prosthesis.  Distal tip is not visualized. 2.  The visualized portions of the right prosthesis are normal. 3.  Moderate degenerative changes in the lower lumbar spine.  Original Report Authenticated By: Resa Miner. MATTERN, M.D.   Dg Hip Portable 1 View Right  12/10/2011  *RADIOLOGY REPORT*  Clinical Data: status post right hip arthroplasty  PORTABLE RIGHT HIP - 1 VIEW  Comparison: 12/10/2011  Findings: The patient is status post right hip arthroplasty.  The hardware components are in anatomic alignment.  No complicating features identified.  IMPRESSION:  1.  No complications after right hip arthroplasty.  Original Report Authenticated By: Angelita Ingles, M.D.    Disposition: To home.  Discharge Orders    Future Orders Please Complete By Expires   Diet - low sodium heart healthy      Call MD / Call 911      Comments:   If you experience chest pain or shortness of breath, CALL 911 and be transported to the hospital emergency room.  If you develope a fever above 101 F, pus (white drainage) or increased drainage or redness at the wound, or calf pain, call your surgeon's office.   Constipation Prevention      Comments:   Drink plenty of fluids.  Prune juice may be helpful.  You may use a stool softener, such as Colace (over the counter) 100 mg twice a day.  Use MiraLax (over the counter) for constipation as needed.   Increase activity slowly as tolerated      Weight  Bearing as taught in Physical Therapy      Comments:   Use a walker or crutches as instructed.   Discharge instructions      Comments:   You can get your actual incision wet starting 12/15/11.  Daily dressing after that with a very thin layer on neosporin over the incision. Increase your activity as comfort allows. Expect thigh and leg swelling. Follow-up in 2 weeks.   Discharge patient      Comments:   Discharge after transfusion today         Signed: Mcarthur Rossetti 12/13/2011, 7:01 AM

## 2011-12-13 NOTE — Progress Notes (Signed)
CSW consulted for SNF placement. PN reviewed. PT has recommended [facility] services. RNCM assisting with d/c planning. CSW is available to assist with SNF placement if plan changes.  Werner Lean  LCSW  [phone]

## 2011-12-14 DIAGNOSIS — Z96649 Presence of unspecified artificial hip joint: Secondary | ICD-10-CM | POA: Diagnosis not present

## 2011-12-14 DIAGNOSIS — D649 Anemia, unspecified: Secondary | ICD-10-CM | POA: Diagnosis not present

## 2011-12-14 DIAGNOSIS — F3289 Other specified depressive episodes: Secondary | ICD-10-CM | POA: Diagnosis not present

## 2011-12-14 DIAGNOSIS — M169 Osteoarthritis of hip, unspecified: Secondary | ICD-10-CM | POA: Diagnosis not present

## 2011-12-14 DIAGNOSIS — I10 Essential (primary) hypertension: Secondary | ICD-10-CM | POA: Diagnosis not present

## 2011-12-14 DIAGNOSIS — Z471 Aftercare following joint replacement surgery: Secondary | ICD-10-CM | POA: Diagnosis not present

## 2011-12-14 LAB — TYPE AND SCREEN
ABO/RH(D): B POS
Antibody Screen: NEGATIVE

## 2011-12-15 ENCOUNTER — Encounter (HOSPITAL_COMMUNITY): Payer: Self-pay | Admitting: Orthopaedic Surgery

## 2011-12-23 DIAGNOSIS — M169 Osteoarthritis of hip, unspecified: Secondary | ICD-10-CM | POA: Diagnosis not present

## 2011-12-28 DIAGNOSIS — Z85828 Personal history of other malignant neoplasm of skin: Secondary | ICD-10-CM | POA: Diagnosis not present

## 2011-12-28 DIAGNOSIS — D239 Other benign neoplasm of skin, unspecified: Secondary | ICD-10-CM | POA: Diagnosis not present

## 2012-01-06 DIAGNOSIS — M545 Low back pain: Secondary | ICD-10-CM | POA: Diagnosis not present

## 2012-01-06 DIAGNOSIS — M169 Osteoarthritis of hip, unspecified: Secondary | ICD-10-CM | POA: Diagnosis not present

## 2012-05-01 DIAGNOSIS — I10 Essential (primary) hypertension: Secondary | ICD-10-CM | POA: Diagnosis not present

## 2012-05-01 DIAGNOSIS — E785 Hyperlipidemia, unspecified: Secondary | ICD-10-CM | POA: Diagnosis not present

## 2012-05-01 DIAGNOSIS — Z23 Encounter for immunization: Secondary | ICD-10-CM | POA: Diagnosis not present

## 2012-05-05 DIAGNOSIS — E785 Hyperlipidemia, unspecified: Secondary | ICD-10-CM | POA: Diagnosis not present

## 2012-05-05 DIAGNOSIS — I10 Essential (primary) hypertension: Secondary | ICD-10-CM | POA: Diagnosis not present

## 2012-05-05 DIAGNOSIS — Z0000 Encounter for general adult medical examination without abnormal findings: Secondary | ICD-10-CM | POA: Diagnosis not present

## 2012-05-05 DIAGNOSIS — J45909 Unspecified asthma, uncomplicated: Secondary | ICD-10-CM | POA: Diagnosis not present

## 2012-05-19 DIAGNOSIS — J45909 Unspecified asthma, uncomplicated: Secondary | ICD-10-CM | POA: Diagnosis not present

## 2012-05-19 DIAGNOSIS — I10 Essential (primary) hypertension: Secondary | ICD-10-CM | POA: Diagnosis not present

## 2012-05-19 DIAGNOSIS — J029 Acute pharyngitis, unspecified: Secondary | ICD-10-CM | POA: Diagnosis not present

## 2012-05-22 ENCOUNTER — Encounter (HOSPITAL_COMMUNITY): Payer: Self-pay | Admitting: *Deleted

## 2012-05-22 ENCOUNTER — Emergency Department (HOSPITAL_COMMUNITY): Payer: Medicare Other

## 2012-05-22 ENCOUNTER — Emergency Department (HOSPITAL_COMMUNITY)
Admission: EM | Admit: 2012-05-22 | Discharge: 2012-05-22 | Disposition: A | Discharge status: Home / Self Care | Payer: Medicare Other | Attending: Internal Medicine | Admitting: Internal Medicine

## 2012-05-22 DIAGNOSIS — H9210 Otorrhea, unspecified ear: Secondary | ICD-10-CM | POA: Diagnosis not present

## 2012-05-22 DIAGNOSIS — I10 Essential (primary) hypertension: Secondary | ICD-10-CM | POA: Diagnosis not present

## 2012-05-22 DIAGNOSIS — E039 Hypothyroidism, unspecified: Secondary | ICD-10-CM | POA: Insufficient documentation

## 2012-05-22 DIAGNOSIS — R42 Dizziness and giddiness: Secondary | ICD-10-CM

## 2012-05-22 DIAGNOSIS — R51 Headache: Secondary | ICD-10-CM | POA: Diagnosis not present

## 2012-05-22 DIAGNOSIS — E860 Dehydration: Secondary | ICD-10-CM | POA: Diagnosis not present

## 2012-05-22 DIAGNOSIS — R55 Syncope and collapse: Secondary | ICD-10-CM | POA: Diagnosis not present

## 2012-05-22 DIAGNOSIS — H8110 Benign paroxysmal vertigo, unspecified ear: Secondary | ICD-10-CM | POA: Diagnosis not present

## 2012-05-22 DIAGNOSIS — H9190 Unspecified hearing loss, unspecified ear: Secondary | ICD-10-CM | POA: Diagnosis not present

## 2012-05-22 DIAGNOSIS — R112 Nausea with vomiting, unspecified: Secondary | ICD-10-CM | POA: Diagnosis not present

## 2012-05-22 DIAGNOSIS — R404 Transient alteration of awareness: Secondary | ICD-10-CM | POA: Diagnosis not present

## 2012-05-22 LAB — BASIC METABOLIC PANEL
CO2: 27 mEq/L (ref 19–32)
GFR calc non Af Amer: 65 mL/min — ABNORMAL LOW (ref >90)
Glucose, Bld: 117 mg/dL — ABNORMAL HIGH (ref 70–99)
Potassium: 3.9 mEq/L (ref 3.5–5.1)
Sodium: 139 mEq/L (ref 135–145)

## 2012-05-22 LAB — CBC
Hemoglobin: 11.8 g/dL — ABNORMAL LOW (ref 12.0–15.0)
MCHC: 35.1 g/dL (ref 30.0–36.0)
RBC: 3.93 MIL/uL (ref 3.87–5.11)
WBC: 7.4 10*3/uL (ref 4.0–10.5)

## 2012-05-22 IMAGING — CT CT MAXILLOFACIAL W/O CM
1 of 3 series · 15 of 30 positions shown, 19 images · non-contrast
Comparison: None.

CT HEAD

CLINICAL DATA: Difficulty hearing in the right ear after blowing
nose.  Headache, ear drainage, nausea, emesis, near-syncope

CT HEAD WITHOUT CONTRAST
CT MAXILLOFACIAL WITHOUT CONTRAST
TECHNIQUE: Multidetector CT imaging of the head and maxillofacial
structures were performed using the standard protocol without
intravenous contrast. Multiplanar CT image reconstructions of the
maxillofacial structures were also generated.

[Series 4: facial st · axial · 0.32mm/px · z∈[-250,-122]mm · 15 of 74 slices shown, 19 images]
[im 5/74  brain]
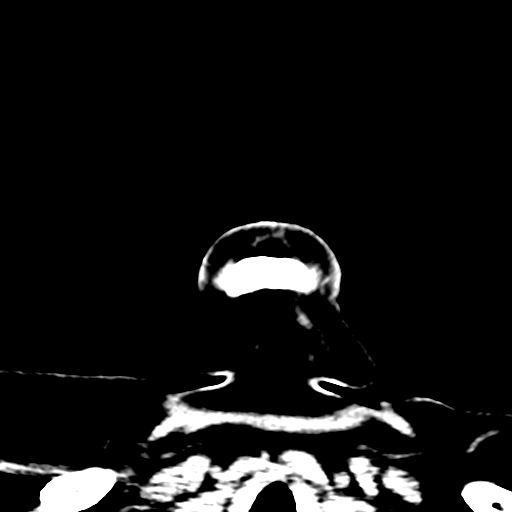
[im 5/74  bone]
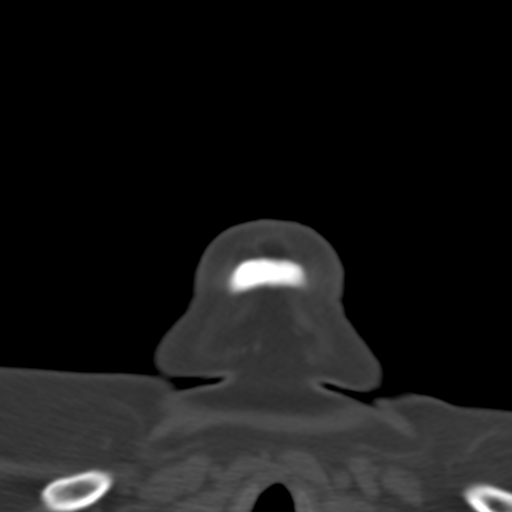
[im 10/74  bone]
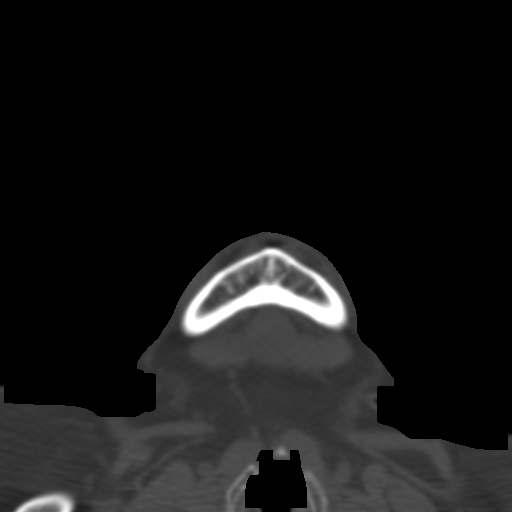
[im 14/74  bone]
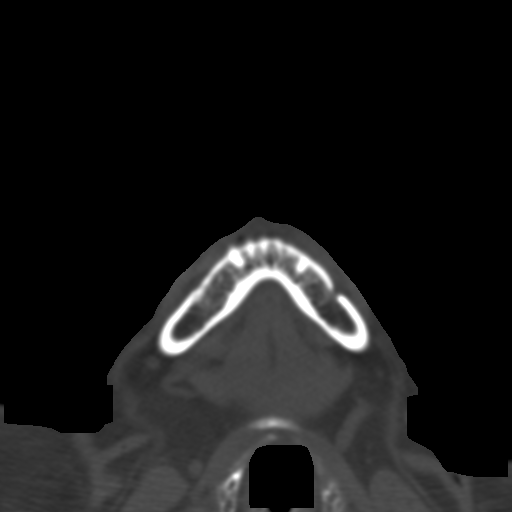
[im 19/74  bone]
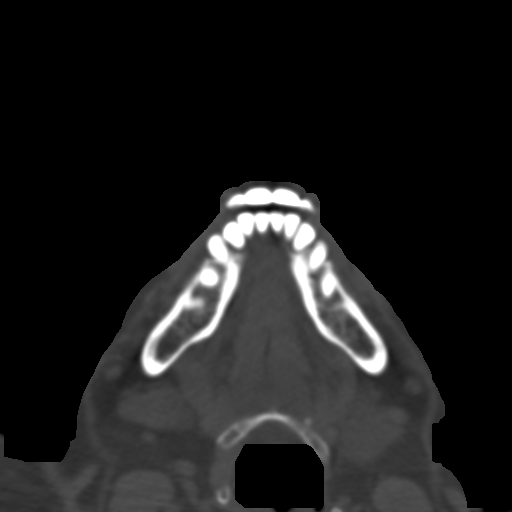
[im 23/74  brain]
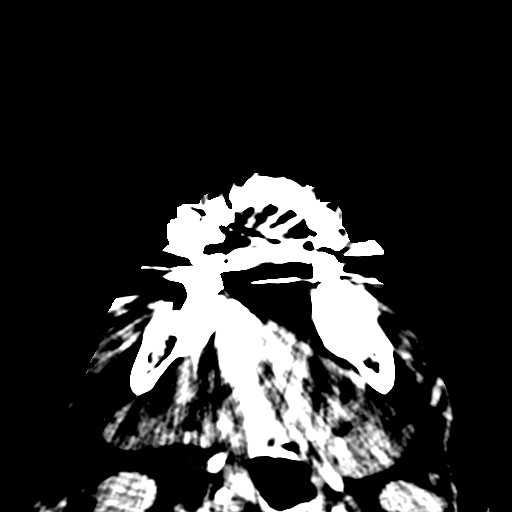
[im 23/74  bone]
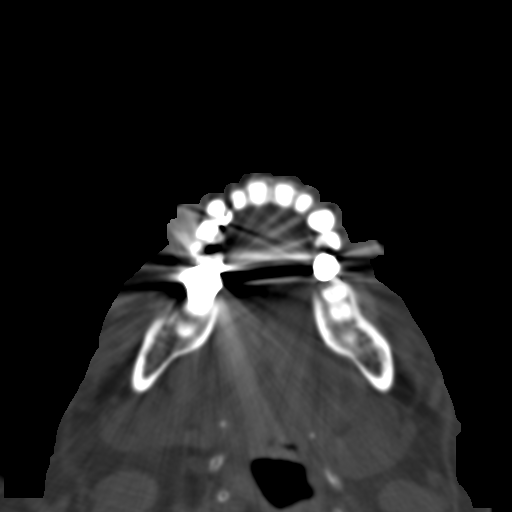
[im 28/74  bone]
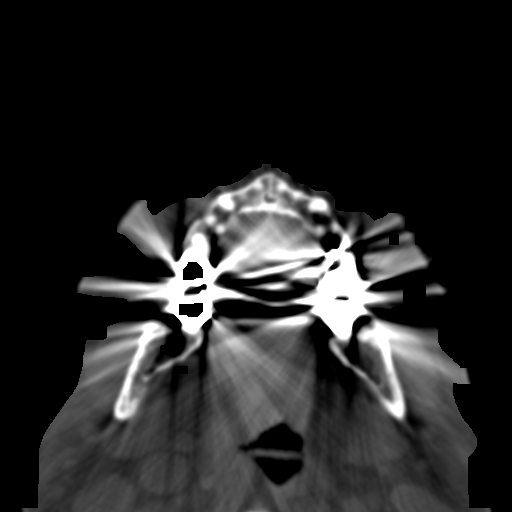
[im 32/74  bone]
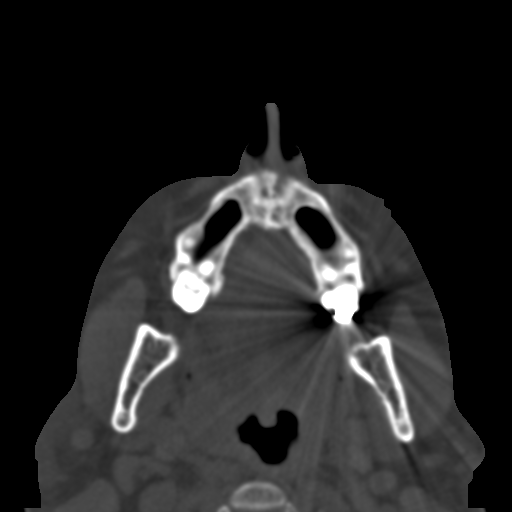
[im 37/74  bone]
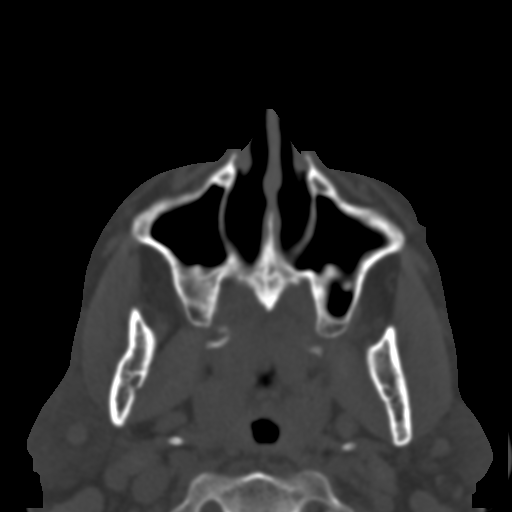
[im 42/74  brain]
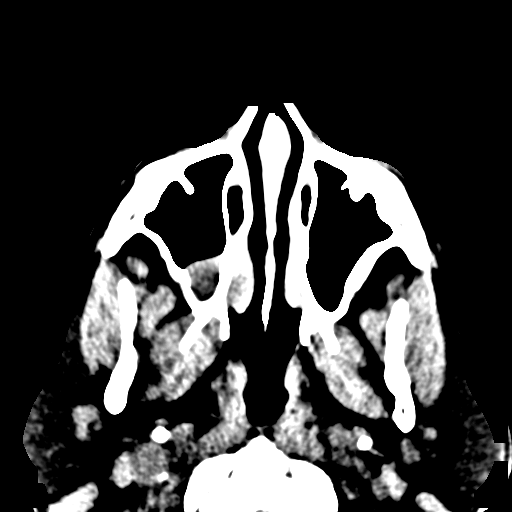
[im 42/74  bone]
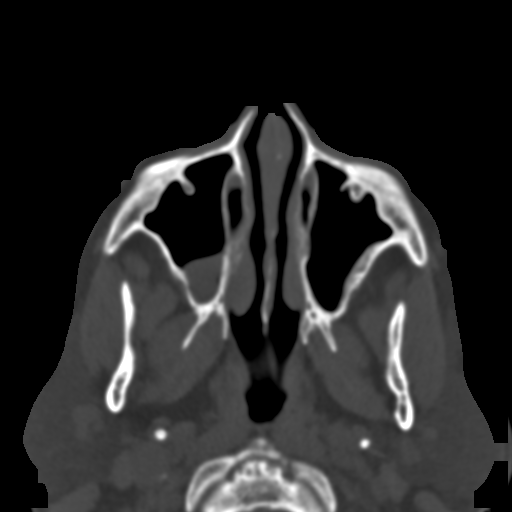
[im 46/74  bone]
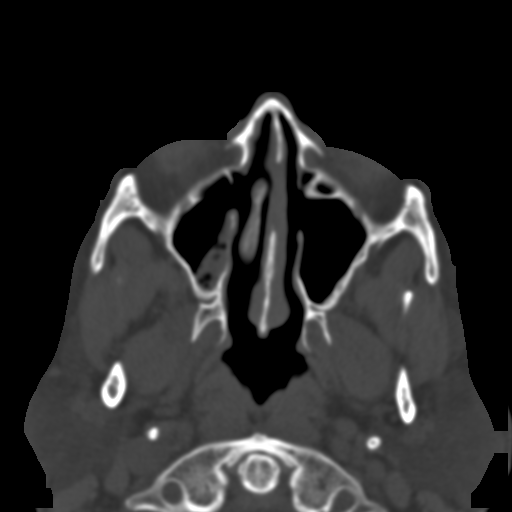
[im 51/74  bone]
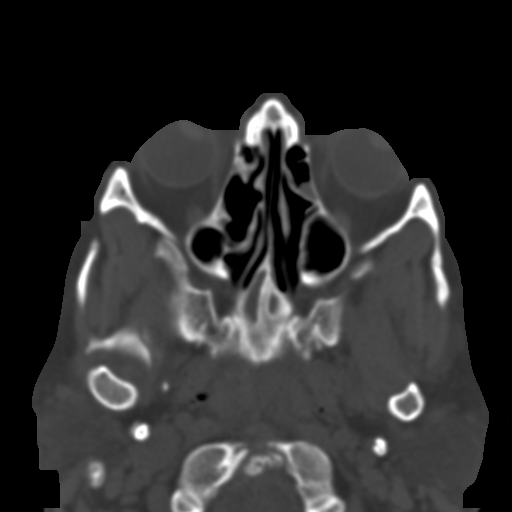
[im 55/74  bone]
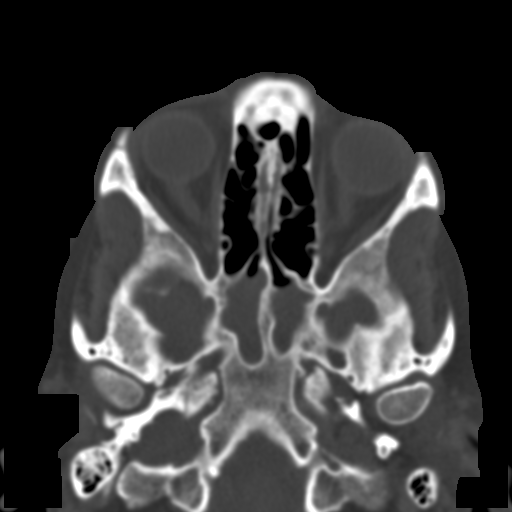
[im 60/74  brain]
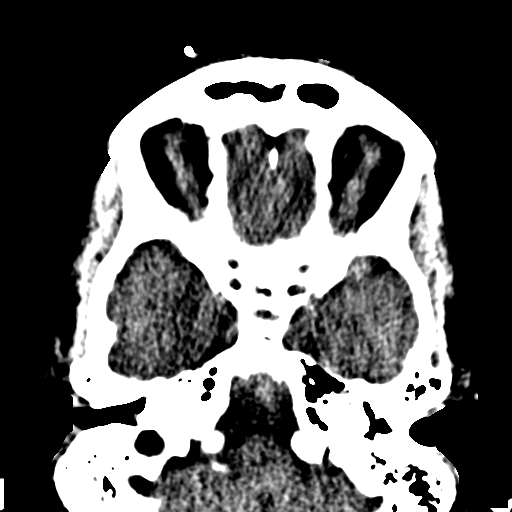
[im 60/74  bone]
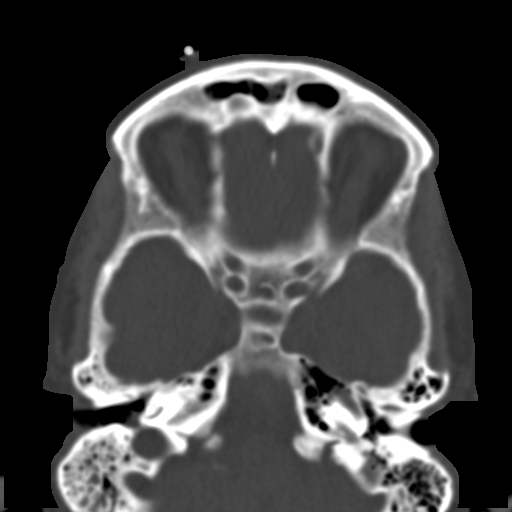
[im 64/74  bone]
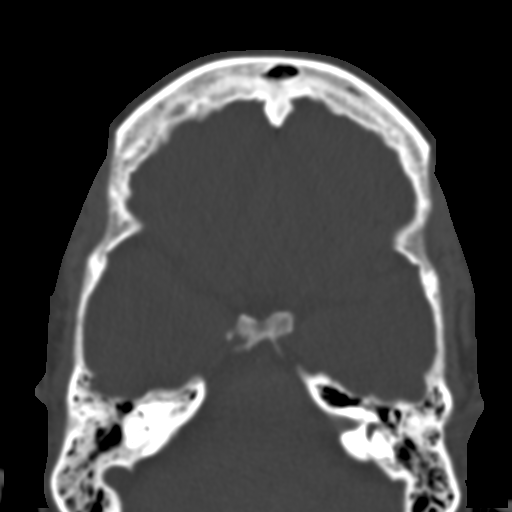
[im 69/74  bone]
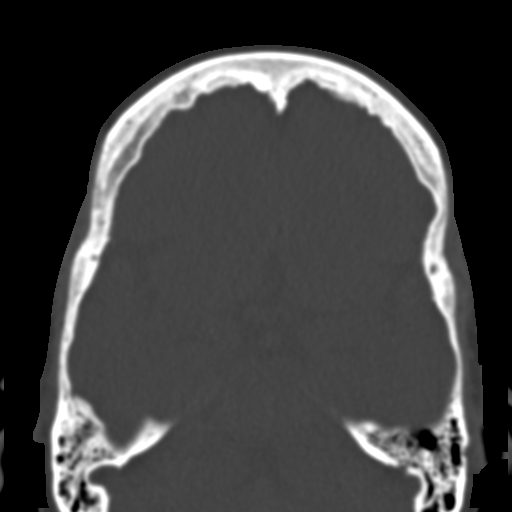

[15 of 30 positions shown; findings below may reference images not displayed]

FINDINGS: There is no evidence for acute infarction, intracranial
hemorrhage, mass lesion, hydrocephalus, or extra-axial fluid.
There is no significant atrophy or white matter disease.  The
calvarium is intact but shows moderate hyperostosis.  Sinuses and
mastoids discussed below.
IMPRESSION: Negative CT head.

CT MAXILLOFACIAL
FINDINGS: Mild fluid accumulation in the right division of the
frontal sinus.  Left division is clear.

A single opacified anterior ethmoid air cell on the right.
Otherwise both ethmoids are clear.

Significant fluid accumulation in both divisions of the sphenoid
sinus likely acute.

Mild mucosal thickening in the maxillary sinuses.  Slight dependent
fluid on the right could be acute.  Nasal septum midline.

Study was not performed as a temporal bone study although the
middle ears and mastoids are visualized.  There is moderate
thickening around the right tympanic membrane.  There is moderate
fluid in the middle ear.  There is no definite ossicular
destruction.  There is moderate dependent fluid in the mastoids
without apparent coalescence of air cells. This right mastoid fluid
is likely an effusion, although right mastoiditis is not excluded.
There is no visible nasopharyngeal mass.

The left middle ear and mastoid are clear.
IMPRESSION: Findings suggesting right otitis media with either dependent
mastoid fluid or mastoiditis.  No signs of bony destruction.

Possible acute right maxillary and bilateral sphenoid sinus
disease.

## 2012-05-22 MED ORDER — SODIUM CHLORIDE 0.9 % IV BOLUS (SEPSIS)
500.0000 mL | Freq: Once | INTRAVENOUS | Status: AC
  Administered 2012-05-22: 500 mL via INTRAVENOUS

## 2012-05-22 MED ORDER — SODIUM CHLORIDE 0.9 % IV SOLN: INTRAVENOUS | Status: DC

## 2012-05-22 MED ORDER — LORAZEPAM 2 MG/ML IJ SOLN
0.5000 mg | Freq: Once | INTRAMUSCULAR | Status: AC
  Administered 2012-05-22: 0.5 mg via INTRAVENOUS
  Filled 2012-05-22: qty 1

## 2012-05-22 MED ORDER — ONDANSETRON HCL 4 MG/2ML IJ SOLN
INTRAMUSCULAR | Status: AC
  Administered 2012-05-22: 12:00:00
  Filled 2012-05-22: qty 2

## 2012-05-22 MED ORDER — MECLIZINE HCL 25 MG PO TABS
25.0000 mg | ORAL_TABLET | Freq: Once | ORAL | Status: AC
  Administered 2012-05-22: 25 mg via ORAL
  Filled 2012-05-22: qty 1

## 2012-05-22 MED ORDER — DEXTROSE 5 % IV SOLN
1.0000 g | Freq: Once | INTRAVENOUS | Status: AC
  Administered 2012-05-22: 1 g via INTRAVENOUS
  Filled 2012-05-22: qty 10

## 2012-05-22 NOTE — ED Notes (Signed)
[id number] Expected date:<BR> Expected time:<BR> Means of arrival:<BR> Comments:<BR> Ear drainage/pain, pt n/v

## 2012-05-22 NOTE — ED Notes (Signed)
Pt here via EMS with c/o ear pain and drainage that began 2 days ago. This morning pt had onset of nausea and vomiting. No ear pain on arrival just N/V. 4 mg Zofran given by EMS

## 2012-05-22 NOTE — Consult Note (Signed)
PCP:   Geoffery Lyons, MD   Chief Complaint:  Dizziness and right ear pain  HPI: Cassidy Garcia is a patient well-known to myself and 66 year old with multiple medical problems outlined below mainly including hypertension, hypothyroidism, asthma presenting at this time with a several day illness including right ear pain and drainage in onset of vertiginous symptoms this morning. Cassidy Garcia is usually very healthy and follows up routinely in the office. Cassidy Garcia recently did have a full annual physical exam and with well maintained. Evidently Friday Cassidy Garcia was started on Augmentin for right otitis media through the nurse practitioner in our office and actually has done well with her reasonable response with dissipation of drainage and slowly decreased and pain to the extent that Cassidy Garcia is virtually pain-free as of this morning. Complicating this Cassidy Garcia did have the onset of fairly significant vertigo this morning with at least one episode of vomiting. This was so difficult that Cassidy Garcia did summon EMS to be brought to the emergency room. As the day has evolved in the emergency room most of the symptoms have dissipated and Cassidy Garcia is tolerating intake well. Cassidy Garcia does continue to have mild vertiginous symptoms but is able to get up and down the bathroom without difficulty. Cassidy Garcia's had no fever chills or sweats. Cassidy Garcia has no meningismus or headaches at this point. Cassidy Garcia's had no skin lesions or joint symptoms. Cassidy Garcia denies any chest pain or shortness of breath. Cassidy Garcia's had no further nausea vomiting. Vision and hearing have remained normal.   Past Medical History: Past Medical History  Diagnosis Date  . Diverticulosis   . Hemorrhoids   . Hyperlipidemia   . GERD (gastroesophageal reflux disease)   . Hypertension   . Colon polyps     tubular adenoma  . Hypothyroidism   . Asthma   . Depression   . Arthritis   . Shortness of breath     with exertion on occasion  . Constipation   . Skin cancer    Past Surgical History  Procedure Date    . Nasal sinus surgery   . Abdominal hysterectomy   . Cholecystectomy   . Colonoscopy 02/12/2011  . Polypectomy   . Hand surgery   . Feet surgery   . Total hip arthroplasty 12/10/2011    Procedure: TOTAL HIP ARTHROPLASTY ANTERIOR APPROACH;  Surgeon: Mcarthur Rossetti, MD;  Location: [facility];  Service: Orthopedics;  Laterality: Right;  Right Total Hip Arthroplasty, Anterior Approach    Medications: Prior to Admission medications   Medication Sig Start Date End Date Taking? Authorizing Provider  albuterol (VENTOLIN HFA) 108 (90 BASE) MCG/ACT inhaler Inhale 2 puffs into the lungs every 6 (six) hours as needed. FOR SHORTNESS OF BREATH   Yes Historical Provider, MD  amitriptyline (ELAVIL) 25 MG tablet 75 mg at bedtime.  01/22/11  Yes Historical Provider, MD  amoxicillin (AMOXIL) 875 MG tablet Take 875 mg by mouth 2 (two) times daily. For 7 days 05/19/12 05/26/12 Yes Historical Provider, MD  aspirin 81 MG tablet Take 81 mg by mouth daily with breakfast.    Yes Historical Provider, MD  Calcium Carb-Cholecalciferol 600-500 MG-UNIT CAPS Take 1 capsule by mouth 2 (two) times daily.    Yes Historical Provider, MD  docusate sodium (COLACE) 100 MG capsule Take 100 mg by mouth 2 (two) times daily.   Yes Historical Provider, MD  Fluticasone-Salmeterol (ADVAIR DISKUS) 250-50 MCG/DOSE AEPB Inhale 1 puff into the lungs every 12 (twelve) hours as needed. FOR RESPIRATORY ISSUES   Yes Historical  Provider, MD  glucosamine-chondroitin 500-400 MG tablet Take 1 tablet by mouth 2 (two) times daily.    Yes Historical Provider, MD  levothyroxine (SYNTHROID, LEVOTHROID) 50 MCG tablet Take 50 mcg by mouth daily with breakfast.    Yes Historical Provider, MD  losartan-hydrochlorothiazide (HYZAAR) 100-25 MG per tablet Take 1 tablet by mouth daily with breakfast.  07/02/11  Yes Historical Provider, MD  mometasone (NASONEX) 50 MCG/ACT nasal spray Place 2 sprays into the nose daily as needed. FOR ALLERGIES   Yes Historical  Provider, MD  Multiple Vitamin (MULTIVITAMIN PO) Take 1 tablet by mouth daily with breakfast.    Yes Historical Provider, MD  Omega-3 Fatty Acids (FISH OIL) 1200 MG CAPS Take 1 capsule by mouth 2 (two) times daily.    Yes Historical Provider, MD  omeprazole (PRILOSEC) 20 MG capsule Take 20 mg by mouth daily with breakfast.    Yes Historical Provider, MD  pravastatin (PRAVACHOL) 20 MG tablet Take 20 mg by mouth daily with breakfast.   Yes Historical Provider, MD  traMADol (ULTRAM) 50 MG tablet Take 50 mg by mouth every 6 (six) hours as needed. FOR PAIN   Yes Historical Provider, MD  verapamil (CALAN-SR) 240 MG CR tablet Take 240 mg by mouth at bedtime.    Yes Historical Provider, MD    Allergies:   Allergies  Allergen Reactions  . Codeine     REACTION: Lightheaded, nauses  . Demerol (Meperidine Hcl) Other (See Comments)    Dizziness     Social History:  reports that Cassidy Garcia quit smoking about 10 years ago. Cassidy Garcia has never used smokeless tobacco. Cassidy Garcia reports that Cassidy Garcia drinks alcohol. Cassidy Garcia reports that Cassidy Garcia does not use illicit drugs.  Family History: Family History  Problem Relation Age of Onset  . Ovarian cancer Mother   . Colon cancer Neg Hx     Physical Exam: Filed Vitals:   05/22/12 1226  BP: 131/55  Pulse: 67  Temp: 97.4 F (36.3 C)  TempSrc: Oral  Resp: 14  SpO2: 90%   General appearance: alert, cooperative and no distress Head: Normocephalic, without obvious abnormality, atraumatic Eyes: conjunctivae/corneas clear. PERRL, EOM's intact.  Right ear reveals normal canal erythema involving the tympanic membrane left external canal and tympanic membrane are normal. Nose: Nares normal. Septum midline. Mucosa normal. No drainage or sinus tenderness. Throat: lips, mucosa, and tongue normal; teeth and gums normal Neck: no adenopathy, no carotid bruit, no JVD and thyroid not enlarged, symmetric, no tenderness/mass/nodules Resp: clear to auscultation bilaterally Cardio: regular rate  and rhythm, S1, S2 normal, no murmur, click, rub or gallop GI: soft, non-tender; bowel sounds normal; no masses,  no organomegaly Extremities: extremities normal, atraumatic, no cyanosis or edema Pulses: 2+ and symmetric Lymph nodes: Cervical adenopathy: no cervical lymphadenopathy Neurologic: Alert and oriented X 3, normal strength and tone. Normal symmetric reflexes. Gait was assessed and Cassidy Garcia ambulated in the room without any difficulty. Cassidy Garcia's able to get up and down out of the bed reasonably well with minimal unsteadiness. Cassidy Garcia has negative Romberg. Negative pronator drift.    Labs on Admission:   Basename 05/22/12 1310  NA 139  K 3.9  CL 101  CO2 27  GLUCOSE 117*  BUN 21  CREATININE 0.90  CALCIUM 9.6  MG --  PHOS --   No results found for this basename: AST:2,ALT:2,ALKPHOS:2,BILITOT:2,PROT:2,ALBUMIN:2 in the last 72 hours No results found for this basename: LIPASE:2,AMYLASE:2 in the last 72 hours  Basename 05/22/12 1310  WBC 7.4  NEUTROABS --  HGB 11.8*  HCT 33.6*  MCV 85.5  PLT 211   No results found for this basename: CKTOTAL:3,CKMB:3,CKMBINDEX:3,TROPONINI:3 in the last 72 hours No results found for this basename: TSH,T4TOTAL,FREET3,T3FREE,THYROIDAB in the last 72 hours No results found for this basename: VITAMINB12:2,FOLATE:2,FERRITIN:2,TIBC:2,IRON:2,RETICCTPCT:2 in the last 72 hours  Radiological Exams on Admission: Ct Head Wo Contrast  05/22/2012  *RADIOLOGY REPORT*  Clinical Data:  Difficulty hearing in the right ear after blowing nose.  Headache, ear drainage, nausea, emesis, near-syncope  CT HEAD WITHOUT CONTRAST CT MAXILLOFACIAL WITHOUT CONTRAST  Technique:  Multidetector CT imaging of the head and maxillofacial structures were performed using the standard protocol without intravenous contrast. Multiplanar CT image reconstructions of the maxillofacial structures were also generated.  Comparison:   None.  CT HEAD  Findings: There is no evidence for acute infarction,  intracranial hemorrhage, mass lesion, hydrocephalus, or extra-axial fluid. There is no significant atrophy or white matter disease.  The calvarium is intact but shows moderate hyperostosis.  Sinuses and mastoids discussed below.  IMPRESSION: Negative CT head.  CT MAXILLOFACIAL  Findings:   Mild fluid accumulation in the right division of the frontal sinus.  Left division is clear.  A single opacified anterior ethmoid air cell on the right. Otherwise both ethmoids are clear.  Significant fluid accumulation in both divisions of the sphenoid sinus likely acute.  Mild mucosal thickening in the maxillary sinuses.  Slight dependent fluid on the right could be acute.  Nasal septum midline.  Study was not performed as a temporal bone study although the middle ears and mastoids are visualized.  There is moderate thickening around the right tympanic membrane.  There is moderate fluid in the middle ear.  There is no definite ossicular destruction.  There is moderate dependent fluid in the mastoids without apparent coalescence of air cells. This right mastoid fluid is likely an effusion, although right mastoiditis is not excluded. There is no visible nasopharyngeal mass.  The left middle ear and mastoid are clear.  IMPRESSION: Findings suggesting right otitis media with either dependent mastoid fluid or mastoiditis.  No signs of bony destruction.  Possible acute right maxillary and bilateral sphenoid sinus disease.   Original Report Authenticated By: Staci Righter, M.D.    Ct Maxillofacial Wo Cm  05/22/2012  *RADIOLOGY REPORT*  Clinical Data:  Difficulty hearing in the right ear after blowing nose.  Headache, ear drainage, nausea, emesis, near-syncope  CT HEAD WITHOUT CONTRAST CT MAXILLOFACIAL WITHOUT CONTRAST  Technique:  Multidetector CT imaging of the head and maxillofacial structures were performed using the standard protocol without intravenous contrast. Multiplanar CT image reconstructions of the maxillofacial  structures were also generated.  Comparison:   None.  CT HEAD  Findings: There is no evidence for acute infarction, intracranial hemorrhage, mass lesion, hydrocephalus, or extra-axial fluid. There is no significant atrophy or white matter disease.  The calvarium is intact but shows moderate hyperostosis.  Sinuses and mastoids discussed below.  IMPRESSION: Negative CT head.  CT MAXILLOFACIAL  Findings:   Mild fluid accumulation in the right division of the frontal sinus.  Left division is clear.  A single opacified anterior ethmoid air cell on the right. Otherwise both ethmoids are clear.  Significant fluid accumulation in both divisions of the sphenoid sinus likely acute.  Mild mucosal thickening in the maxillary sinuses.  Slight dependent fluid on the right could be acute.  Nasal septum midline.  Study was not performed as a temporal bone study although the middle ears and mastoids are  visualized.  There is moderate thickening around the right tympanic membrane.  There is moderate fluid in the middle ear.  There is no definite ossicular destruction.  There is moderate dependent fluid in the mastoids without apparent coalescence of air cells. This right mastoid fluid is likely an effusion, although right mastoiditis is not excluded. There is no visible nasopharyngeal mass.  The left middle ear and mastoid are clear.  IMPRESSION: Findings suggesting right otitis media with either dependent mastoid fluid or mastoiditis.  No signs of bony destruction.  Possible acute right maxillary and bilateral sphenoid sinus disease.   Original Report Authenticated By: Staci Righter, M.D.    Orders placed during the hospital encounter of 05/22/12  . ED EKG  . ED EKG    Assessment/Plan #1 right otitis media as well as probable mastoiditis and some maxillary sinus disease as well. Most of the symptoms have subsided since the initiation of Augmentin on Friday and Cassidy Garcia certainly nontoxic seemingly responding to the Augmentin #2  vertigo probably complicating #1 clearly holding fluids down gait reasonably stable no indication for omission no evidence of any vascular process #3 essential hypertension stable #4 chronic asthma stable #5 primary hypothyroidism stable  Plan patient is to be released and not meant 875 twice a day additional dosing was called to [facility] [phone]. In addition Cassidy Garcia was instructed on meclizine 25 3 times a day #30. Cassidy Garcia does have her friend with her we have encouraged to push fluids, caution with stairs gait and no driving. Cassidy Garcia is to followup in the office.   Nichlos Kunzler A 05/22/2012, 6:14 PM

## 2012-05-22 NOTE — ED Provider Notes (Signed)
History     CSN: [id number]  Arrival date & time 05/22/12  1207   First MD Initiated Contact with Patient 05/22/12 1228      Chief Complaint  Patient presents with  . Ear Drainage  . Nausea  . Emesis  . Near Syncope    (Consider location/radiation/quality/duration/timing/severity/associated sxs/prior treatment) HPI... status post right ear drainage past weekend after blowing her nose on Friday.  Now with ataxia and nausea.  No fever, sweats, chills.  Drainage described as light yellow but not purulent.  Severity is moderate.    Past Medical History  Diagnosis Date  . Diverticulosis   . Hemorrhoids   . Hyperlipidemia   . GERD (gastroesophageal reflux disease)   . Hypertension   . Colon polyps     tubular adenoma  . Skin cancer   . Hypothyroidism   . Asthma   . Depression   . Arthritis   . Shortness of breath     with exertion on occasion  . Constipation     Past Surgical History  Procedure Date  . Nasal sinus surgery   . Abdominal hysterectomy   . Cholecystectomy   . Colonoscopy 02/12/2011  . Polypectomy   . Hand surgery   . Feet surgery   . Total hip arthroplasty 12/10/2011    Procedure: TOTAL HIP ARTHROPLASTY ANTERIOR APPROACH;  Surgeon: Mcarthur Rossetti, MD;  Location: [facility];  Service: Orthopedics;  Laterality: Right;  Right Total Hip Arthroplasty, Anterior Approach    Family History  Problem Relation Age of Onset  . Ovarian cancer Mother   . Colon cancer Neg Hx     History  Substance Use Topics  . Smoking status: Former Smoker    Quit date: 08/16/2001  . Smokeless tobacco: Never Used  . Alcohol Use: Yes     rare    OB History    Grav Para Term Preterm Abortions TAB SAB Ect Mult Living                  Review of Systems  All other systems reviewed and are negative.    Allergies  Codeine and Demerol  Home Medications   Current Outpatient Rx  Name Route Sig Dispense Refill  . ALBUTEROL SULFATE HFA 108 (90 BASE) MCG/ACT IN AERS  Inhalation Inhale 2 puffs into the lungs every 6 (six) hours as needed. FOR SHORTNESS OF BREATH    . AMITRIPTYLINE HCL 25 MG PO TABS  75 mg at bedtime.     . AMOXICILLIN 875 MG PO TABS Oral Take 875 mg by mouth 2 (two) times daily. For 7 days    . ASPIRIN 81 MG PO TABS Oral Take 81 mg by mouth daily with breakfast.     . CALCIUM CARB-CHOLECALCIFEROL 600-500 MG-UNIT PO CAPS Oral Take 1 capsule by mouth 2 (two) times daily.     Marland Kitchen DOCUSATE SODIUM 100 MG PO CAPS Oral Take 100 mg by mouth 2 (two) times daily.    Marland Kitchen FLUTICASONE-SALMETEROL 250-50 MCG/DOSE IN AEPB Inhalation Inhale 1 puff into the lungs every 12 (twelve) hours as needed. FOR RESPIRATORY ISSUES    . GLUCOSAMINE-CHONDROITIN 500-400 MG PO TABS Oral Take 1 tablet by mouth 2 (two) times daily.     Marland Kitchen LEVOTHYROXINE SODIUM 50 MCG PO TABS Oral Take 50 mcg by mouth daily with breakfast.     . LOSARTAN POTASSIUM-HCTZ 100-25 MG PO TABS Oral Take 1 tablet by mouth daily with breakfast.     . MOMETASONE  FUROATE 50 MCG/ACT NA SUSP Nasal Place 2 sprays into the nose daily as needed. FOR ALLERGIES    . MULTIVITAMIN PO Oral Take 1 tablet by mouth daily with breakfast.     . FISH OIL 1200 MG PO CAPS Oral Take 1 capsule by mouth 2 (two) times daily.     Marland Kitchen OMEPRAZOLE 20 MG PO CPDR Oral Take 20 mg by mouth daily with breakfast.     . PRAVASTATIN SODIUM 20 MG PO TABS Oral Take 20 mg by mouth daily with breakfast.    . TRAMADOL HCL 50 MG PO TABS Oral Take 50 mg by mouth every 6 (six) hours as needed. FOR PAIN    . VERAPAMIL HCL 240 MG PO TBCR Oral Take 240 mg by mouth at bedtime.       BP 131/55  Pulse 67  Temp 97.4 F (36.3 C) (Oral)  Resp 14  SpO2 90%  Physical Exam  Nursing note and vitals reviewed. Constitutional: She is oriented to person, place, and time. She appears well-developed and well-nourished.  HENT:  Head: Normocephalic and atraumatic.       Left tympanic membrane normal. Right tympanic membrane shows no obvious perforation; however  membrane is injected and erythematous  Eyes: Conjunctivae normal and EOM are normal. Pupils are equal, round, and reactive to light.  Neck: Normal range of motion. Neck supple.  Cardiovascular: Normal rate, regular rhythm and normal heart sounds.   Pulmonary/Chest: Effort normal and breath sounds normal.  Abdominal: Soft. Bowel sounds are normal.  Musculoskeletal: Normal range of motion.  Neurological: She is alert and oriented to person, place, and time.  Skin: Skin is warm and dry.  Psychiatric: She has a normal mood and affect.    ED Course  Procedures (including critical care time)  Labs Reviewed  CBC - Abnormal; Notable for the following:    Hemoglobin 11.8 (*)     HCT 33.6 (*)     All other components within normal limits  BASIC METABOLIC PANEL - Abnormal; Notable for the following:    Glucose, Bld 117 (*)     GFR calc non Af Amer 65 (*)     GFR calc Af Amer 76 (*)     All other components within normal limits   No results found.  Ct Head Wo Contrast  05/22/2012  *RADIOLOGY REPORT*  Clinical Data:  Difficulty hearing in the right ear after blowing nose.  Headache, ear drainage, nausea, emesis, near-syncope  CT HEAD WITHOUT CONTRAST CT MAXILLOFACIAL WITHOUT CONTRAST  Technique:  Multidetector CT imaging of the head and maxillofacial structures were performed using the standard protocol without intravenous contrast. Multiplanar CT image reconstructions of the maxillofacial structures were also generated.  Comparison:   None.  CT HEAD  Findings: There is no evidence for acute infarction, intracranial hemorrhage, mass lesion, hydrocephalus, or extra-axial fluid. There is no significant atrophy or white matter disease.  The calvarium is intact but shows moderate hyperostosis.  Sinuses and mastoids discussed below.  IMPRESSION: Negative CT head.  CT MAXILLOFACIAL  Findings:   Mild fluid accumulation in the right division of the frontal sinus.  Left division is clear.  A single opacified  anterior ethmoid air cell on the right. Otherwise both ethmoids are clear.  Significant fluid accumulation in both divisions of the sphenoid sinus likely acute.  Mild mucosal thickening in the maxillary sinuses.  Slight dependent fluid on the right could be acute.  Nasal septum midline.  Study was not performed as  a temporal bone study although the middle ears and mastoids are visualized.  There is moderate thickening around the right tympanic membrane.  There is moderate fluid in the middle ear.  There is no definite ossicular destruction.  There is moderate dependent fluid in the mastoids without apparent coalescence of air cells. This right mastoid fluid is likely an effusion, although right mastoiditis is not excluded. There is no visible nasopharyngeal mass.  The left middle ear and mastoid are clear.  IMPRESSION: Findings suggesting right otitis media with either dependent mastoid fluid or mastoiditis.  No signs of bony destruction.  Possible acute right maxillary and bilateral sphenoid sinus disease.   Original Report Authenticated By: Staci Righter, M.D.    Ct Maxillofacial Wo Cm  05/22/2012  *RADIOLOGY REPORT*  Clinical Data:  Difficulty hearing in the right ear after blowing nose.  Headache, ear drainage, nausea, emesis, near-syncope  CT HEAD WITHOUT CONTRAST CT MAXILLOFACIAL WITHOUT CONTRAST  Technique:  Multidetector CT imaging of the head and maxillofacial structures were performed using the standard protocol without intravenous contrast. Multiplanar CT image reconstructions of the maxillofacial structures were also generated.  Comparison:   None.  CT HEAD  Findings: There is no evidence for acute infarction, intracranial hemorrhage, mass lesion, hydrocephalus, or extra-axial fluid. There is no significant atrophy or white matter disease.  The calvarium is intact but shows moderate hyperostosis.  Sinuses and mastoids discussed below.  IMPRESSION: Negative CT head.  CT MAXILLOFACIAL  Findings:   Mild  fluid accumulation in the right division of the frontal sinus.  Left division is clear.  A single opacified anterior ethmoid air cell on the right. Otherwise both ethmoids are clear.  Significant fluid accumulation in both divisions of the sphenoid sinus likely acute.  Mild mucosal thickening in the maxillary sinuses.  Slight dependent fluid on the right could be acute.  Nasal septum midline.  Study was not performed as a temporal bone study although the middle ears and mastoids are visualized.  There is moderate thickening around the right tympanic membrane.  There is moderate fluid in the middle ear.  There is no definite ossicular destruction.  There is moderate dependent fluid in the mastoids without apparent coalescence of air cells. This right mastoid fluid is likely an effusion, although right mastoiditis is not excluded. There is no visible nasopharyngeal mass.  The left middle ear and mastoid are clear.  IMPRESSION: Findings suggesting right otitis media with either dependent mastoid fluid or mastoiditis.  No signs of bony destruction.  Possible acute right maxillary and bilateral sphenoid sinus disease.   Original Report Authenticated By: Staci Righter, M.D.    No diagnosis found.   Date: 05/22/2012  Rate: 70  Rhythm: normal sinus rhythm  QRS Axis: normal  Intervals: normal  ST/T Wave abnormalities: normal  Conduction Disutrbances: none  Narrative Interpretation: unremarkable     MDM  History and physical consistent with right otitis media with probable mastoiditis.  Patient is ataxic.  Rx IV Rocephin. Discussed with Dr. Jacquiline Doe nurse.        Nat Christen, MD 05/22/12 1536

## 2012-05-22 NOTE — ED Notes (Signed)
Pt says ear pain and drainage is gone, N/V better. Pt says now she cant hear from the right ear since Saturday.

## 2012-05-22 NOTE — ED Notes (Signed)
Dr. Reynaldo Minium at bedside. Patient transported to MRI.

## 2012-05-22 NOTE — ED Provider Notes (Signed)
Patient who wrote prescriptions for patient and made arrangements for discharge seen by Dr.Aronson at 7 PM patient alert ambulatory gait is steady she feels well to go home,  Orlie Dakin, MD 05/22/12 1903

## 2012-05-23 DIAGNOSIS — Z1212 Encounter for screening for malignant neoplasm of rectum: Secondary | ICD-10-CM | POA: Diagnosis not present

## 2012-06-06 DIAGNOSIS — I10 Essential (primary) hypertension: Secondary | ICD-10-CM | POA: Diagnosis not present

## 2012-06-06 DIAGNOSIS — H9190 Unspecified hearing loss, unspecified ear: Secondary | ICD-10-CM | POA: Diagnosis not present

## 2012-06-06 DIAGNOSIS — Z09 Encounter for follow-up examination after completed treatment for conditions other than malignant neoplasm: Secondary | ICD-10-CM | POA: Diagnosis not present

## 2012-06-06 DIAGNOSIS — R928 Other abnormal and inconclusive findings on diagnostic imaging of breast: Secondary | ICD-10-CM | POA: Diagnosis not present

## 2012-06-06 DIAGNOSIS — H6690 Otitis media, unspecified, unspecified ear: Secondary | ICD-10-CM | POA: Diagnosis not present

## 2012-06-12 DIAGNOSIS — D497 Neoplasm of unspecified behavior of endocrine glands and other parts of nervous system: Secondary | ICD-10-CM | POA: Diagnosis not present

## 2012-06-12 DIAGNOSIS — H6690 Otitis media, unspecified, unspecified ear: Secondary | ICD-10-CM | POA: Diagnosis not present

## 2012-07-10 DIAGNOSIS — M25519 Pain in unspecified shoulder: Secondary | ICD-10-CM | POA: Diagnosis not present

## 2012-07-10 DIAGNOSIS — M67919 Unspecified disorder of synovium and tendon, unspecified shoulder: Secondary | ICD-10-CM | POA: Diagnosis not present

## 2012-07-10 DIAGNOSIS — M169 Osteoarthritis of hip, unspecified: Secondary | ICD-10-CM | POA: Diagnosis not present

## 2012-08-02 DIAGNOSIS — M169 Osteoarthritis of hip, unspecified: Secondary | ICD-10-CM | POA: Diagnosis not present

## 2012-09-29 DIAGNOSIS — D2310 Other benign neoplasm of skin of unspecified eyelid, including canthus: Secondary | ICD-10-CM | POA: Diagnosis not present

## 2012-09-29 DIAGNOSIS — H52209 Unspecified astigmatism, unspecified eye: Secondary | ICD-10-CM | POA: Diagnosis not present

## 2012-09-29 DIAGNOSIS — H524 Presbyopia: Secondary | ICD-10-CM | POA: Diagnosis not present

## 2012-09-29 DIAGNOSIS — H2510 Age-related nuclear cataract, unspecified eye: Secondary | ICD-10-CM | POA: Diagnosis not present

## 2012-10-23 DIAGNOSIS — K219 Gastro-esophageal reflux disease without esophagitis: Secondary | ICD-10-CM | POA: Diagnosis not present

## 2012-10-23 DIAGNOSIS — E785 Hyperlipidemia, unspecified: Secondary | ICD-10-CM | POA: Diagnosis not present

## 2012-10-23 DIAGNOSIS — I10 Essential (primary) hypertension: Secondary | ICD-10-CM | POA: Diagnosis not present

## 2012-10-23 DIAGNOSIS — J45909 Unspecified asthma, uncomplicated: Secondary | ICD-10-CM | POA: Diagnosis not present

## 2012-11-20 DIAGNOSIS — M169 Osteoarthritis of hip, unspecified: Secondary | ICD-10-CM | POA: Diagnosis not present

## 2012-12-01 ENCOUNTER — Ambulatory Visit (HOSPITAL_COMMUNITY)
Admission: RE | Admit: 2012-12-01 | Discharge: 2012-12-01 | Disposition: A | Discharge status: Home / Self Care | Payer: Medicare Other | Source: Ambulatory Visit | Attending: Family Medicine | Admitting: Family Medicine

## 2012-12-01 ENCOUNTER — Ambulatory Visit (HOSPITAL_COMMUNITY)
Admission: AD | Admit: 2012-12-01 | Discharge: 2012-12-01 | Disposition: A | Discharge status: Home / Self Care | Payer: Medicare Other | Source: Other Acute Inpatient Hospital | Attending: Family Medicine | Admitting: Family Medicine

## 2012-12-01 ENCOUNTER — Ambulatory Visit (INDEPENDENT_AMBULATORY_CARE_PROVIDER_SITE_OTHER): Payer: Medicare Other | Admitting: Family Medicine

## 2012-12-01 VITALS — BP 139/77 | HR 98 | Temp 98.5°F | Resp 16 | Ht 65.0 in | Wt 195.0 lb

## 2012-12-01 DIAGNOSIS — J3489 Other specified disorders of nose and nasal sinuses: Secondary | ICD-10-CM | POA: Diagnosis not present

## 2012-12-01 DIAGNOSIS — R51 Headache: Secondary | ICD-10-CM

## 2012-12-01 DIAGNOSIS — W11XXXA Fall on and from ladder, initial encounter: Secondary | ICD-10-CM

## 2012-12-01 DIAGNOSIS — G319 Degenerative disease of nervous system, unspecified: Secondary | ICD-10-CM | POA: Insufficient documentation

## 2012-12-01 DIAGNOSIS — S0990XA Unspecified injury of head, initial encounter: Secondary | ICD-10-CM | POA: Diagnosis not present

## 2012-12-01 IMAGING — CT CT HEAD W/O CM
1 of 2 series · 16 of 30 positions shown, 20 images · non-contrast
Comparison: [DATE]

CLINICAL DATA: Headache, fall from ladder, hit left side of head

CT HEAD WITHOUT CONTRAST
TECHNIQUE: Contiguous axial images were obtained from the base of
the skull through the vertex without contrast.

[Series 3: recon 2: brain · axial · 0.49mm/px · z∈[+114,+257]mm · 16 of 64 slices shown, 20 images]
[im 4/64  brain]
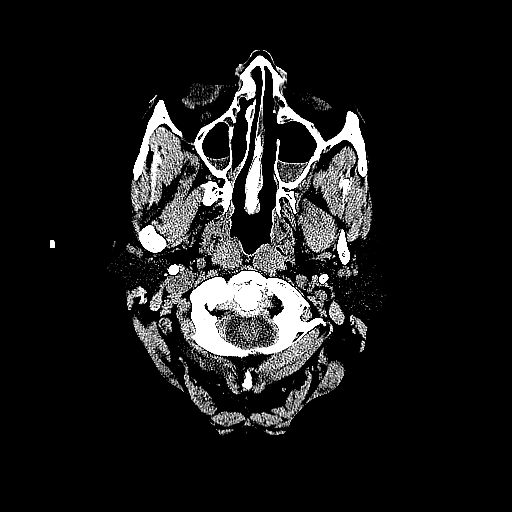
[im 4/64  bone]
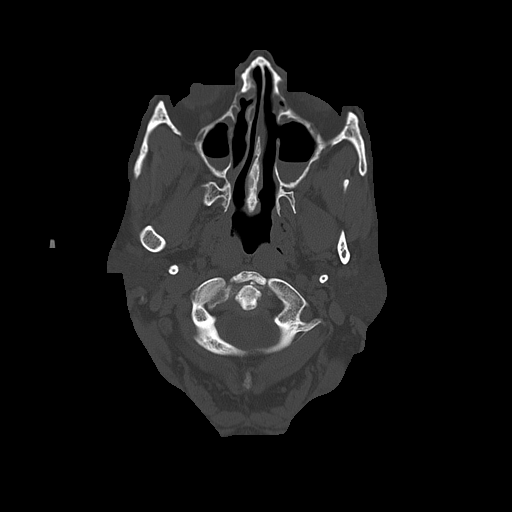
[im 7/64  brain]
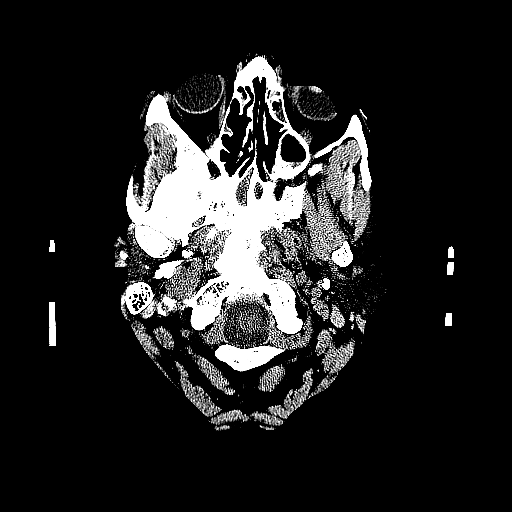
[im 10/64  brain]
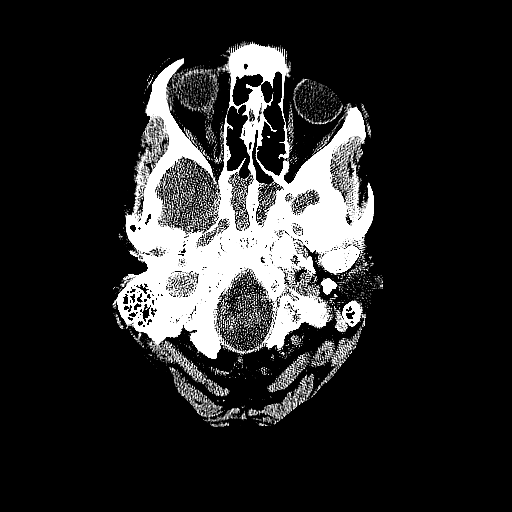
[im 14/64  brain]
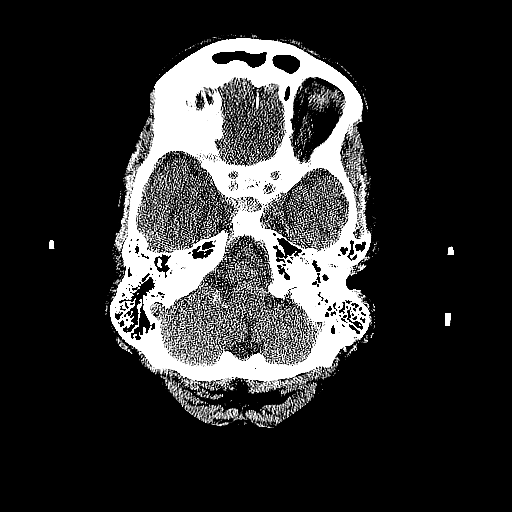
[im 20/64  brain]
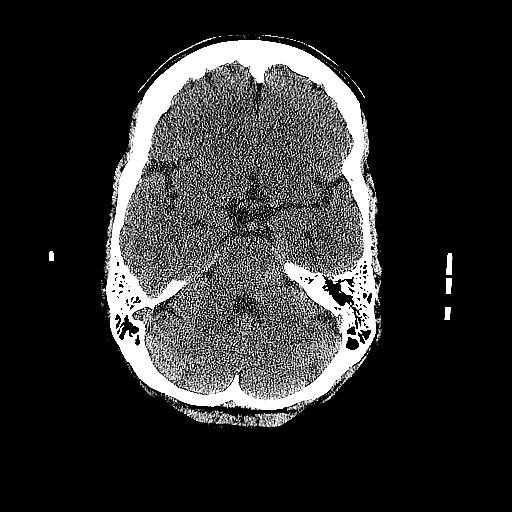
[im 20/64  bone]
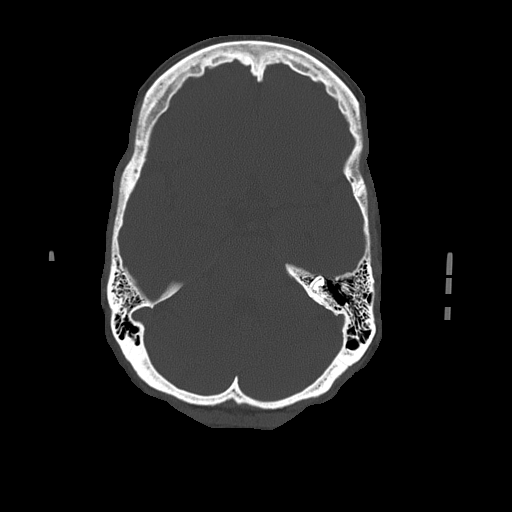
[im 24/64  brain]
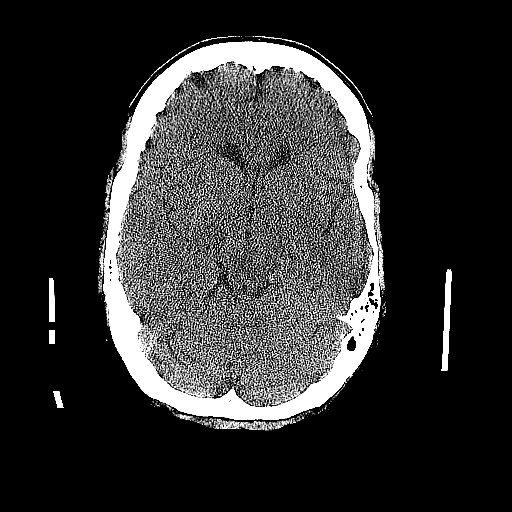
[im 27/64  brain]
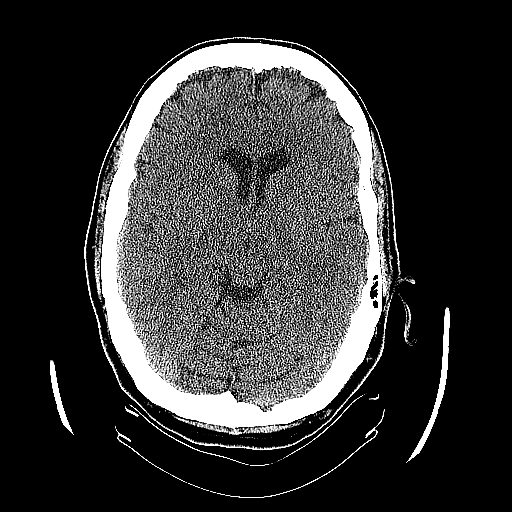
[im 30/64  brain]
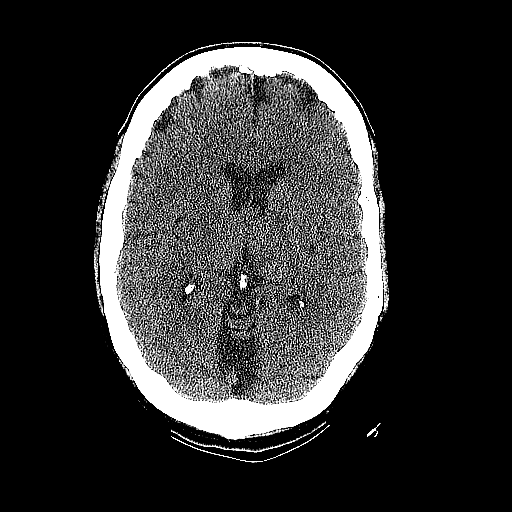
[im 34/64  brain]
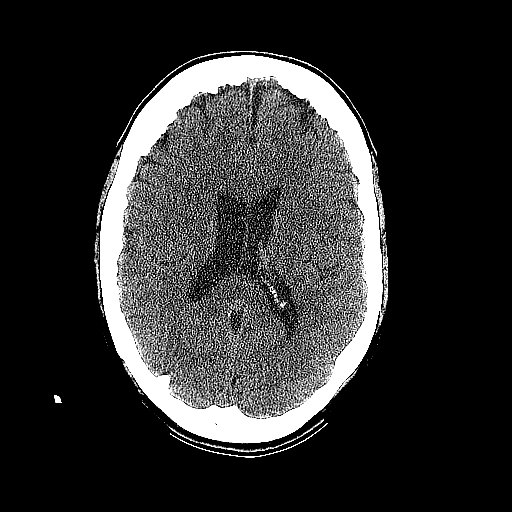
[im 34/64  bone]
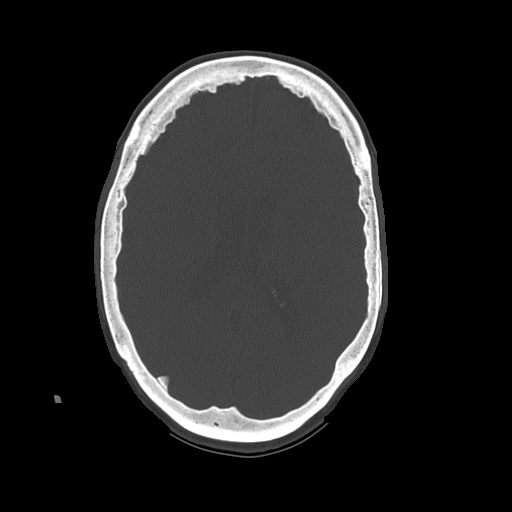
[im 37/64  brain]
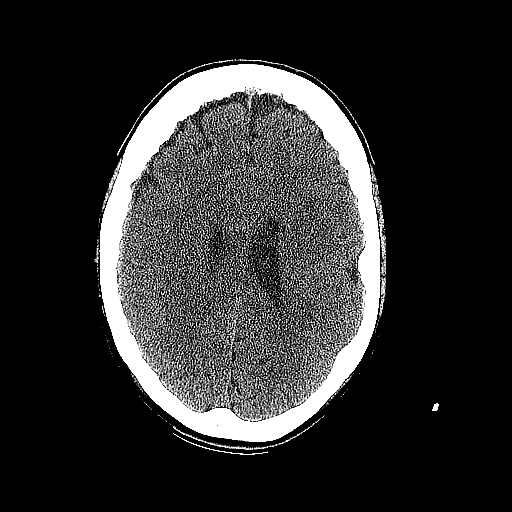
[im 40/64  brain]
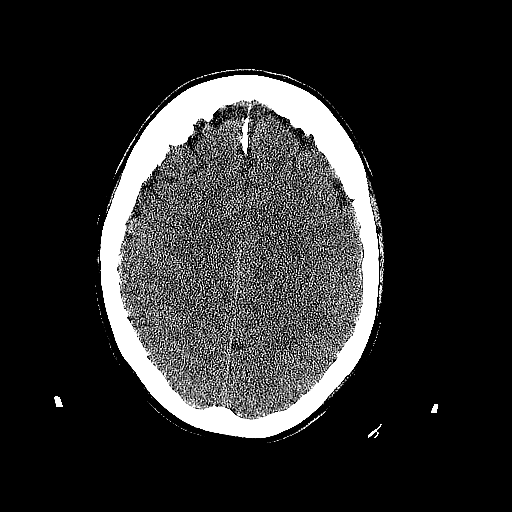
[im 44/64  brain]
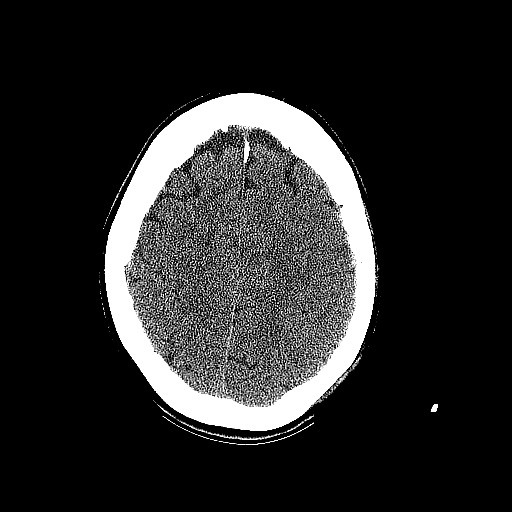
[im 50/64  brain]
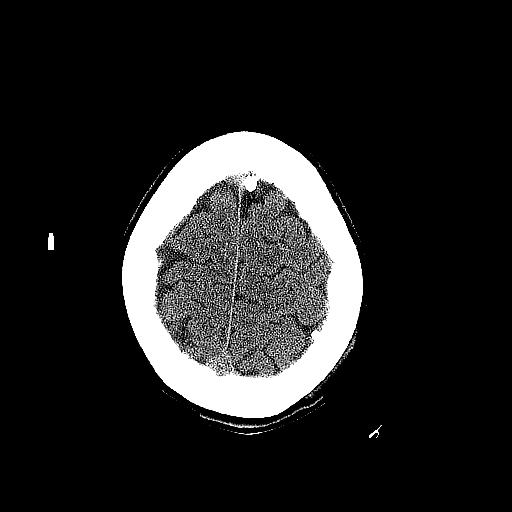
[im 50/64  bone]
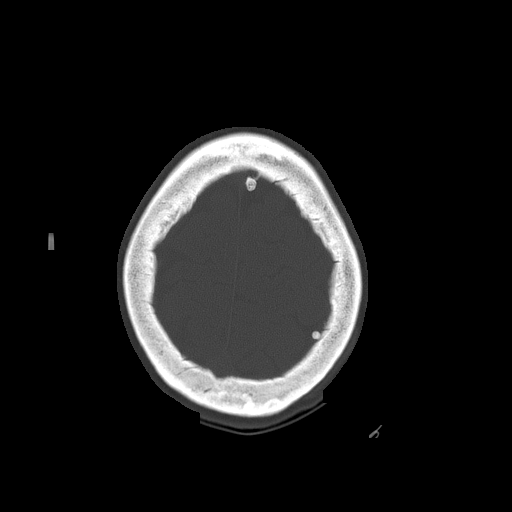
[im 54/64  brain]
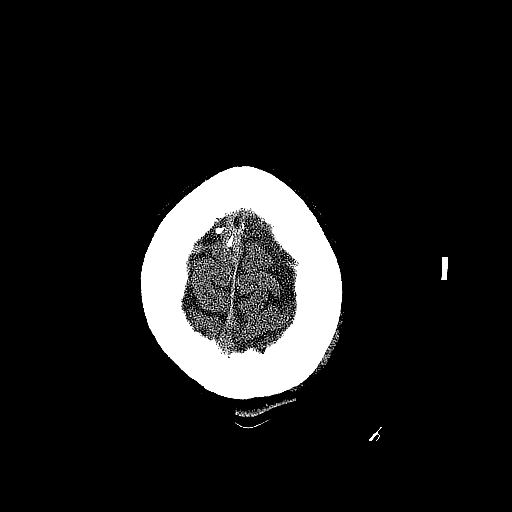
[im 57/64  brain]
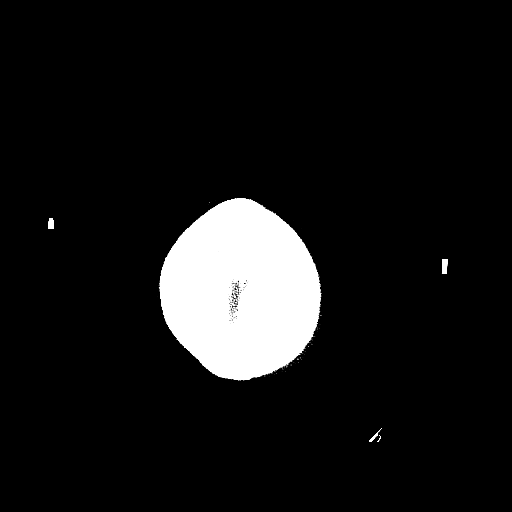
[im 60/64  brain]
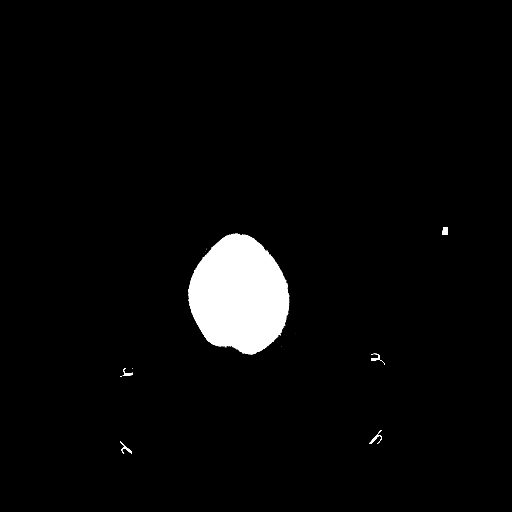

[16 of 30 positions shown; findings below may reference images not displayed]

FINDINGS: No evidence of parenchymal hemorrhage or extra-axial
fluid collection. No mass lesion, mass effect, or midline shift.

No CT evidence of acute infarction.

Subcortical white matter and periventricular small vessel ischemic
changes.

Mild cortical atrophy.  No ventriculomegaly.

Near complete opacification of the bilateral sphenoid sinuses,
chronic.  Air-fluid levels in the bilateral maxillary sinuses..
The mastoid air cells are clear.

No evidence of calvarial fracture.
IMPRESSION: No evidence of acute intracranial abnormality.

Atrophy with small vessel ischemic changes.

Paranasal sinus disease, as described above.

## 2012-12-01 NOTE — Progress Notes (Signed)
[place], [place]  [id number]   [phone]  Subjective:    Patient ID: Cassidy Garcia, female    DOB: May 24, 1946, 67 y.o.   MRN: [id number]  HPI This 67 y.o. female presents for evaluation of head injury.  Trying to clean out gutter this morning at 10:00am and ladder tipped over.  Hit head on driveway.  No loss of consciousness.  Slight dizziness upon sitting up for the first time.  Sore along L parietal area.  Feels indentation with ridge.  No bleeding.  Worried about ridge and indentation along scalp; worried about skull fracture and bleed  intracranial.  No headache; no further dizziness; no blurred vision; diplopia; No confusion; no fuzzy thoughts; mild nausea; no vomiting.  Ladder was 5-6 feet in air; fell 5-6 feet.  No bleeding.  No blood thinners; takes ASA 81mg  daily; takes Relafen.  No increase in emotions.  Lives alone.  Retired.    Review of Systems  Constitutional: Negative for fever, chills, diaphoresis and fatigue.  HENT: Negative for facial swelling, rhinorrhea, neck pain, neck stiffness and ear discharge.   Eyes: Negative for photophobia and visual disturbance.  Skin: Negative for wound.  Neurological: Positive for dizziness. Negative for tremors, seizures, syncope, facial asymmetry, speech difficulty, weakness, light-headedness, numbness and headaches.  Psychiatric/Behavioral: Negative for behavioral problems, confusion, decreased concentration and agitation.        Past Medical History  Diagnosis Date  . Diverticulosis   . Hemorrhoids   . Hyperlipidemia   . GERD (gastroesophageal reflux disease)   . Hypertension   . Colon polyps     tubular adenoma  . Hypothyroidism   . Asthma   . Depression   . Arthritis   . Shortness of breath     with exertion on occasion  . Constipation   . Skin cancer     Past Surgical History  Procedure Laterality Date  . Nasal sinus surgery    . Abdominal hysterectomy    . Cholecystectomy    . Colonoscopy  02/12/2011    . Polypectomy    . Hand surgery    . Feet surgery    . Total hip arthroplasty  12/10/2011    Procedure: TOTAL HIP ARTHROPLASTY ANTERIOR APPROACH;  Surgeon: Mcarthur Rossetti, MD;  Location: [facility];  Service: Orthopedics;  Laterality: Right;  Right Total Hip Arthroplasty, Anterior Approach    Prior to Admission medications   Medication Sig Start Date End Date Taking? Authorizing Provider  albuterol (VENTOLIN HFA) 108 (90 BASE) MCG/ACT inhaler Inhale 2 puffs into the lungs every 6 (six) hours as needed. FOR SHORTNESS OF BREATH   Yes Historical Provider, MD  amitriptyline (ELAVIL) 25 MG tablet 75 mg at bedtime.  01/22/11  Yes Historical Provider, MD  aspirin 81 MG tablet Take 81 mg by mouth daily with breakfast.    Yes Historical Provider, MD  glucosamine-chondroitin 500-400 MG tablet Take 1 tablet by mouth 2 (two) times daily.    Yes Historical Provider, MD  levothyroxine (SYNTHROID, LEVOTHROID) 50 MCG tablet Take 50 mcg by mouth daily with breakfast.    Yes Historical Provider, MD  losartan-hydrochlorothiazide (HYZAAR) 100-25 MG per tablet Take 1 tablet by mouth daily with breakfast.  07/02/11  Yes Historical Provider, MD  Multiple Vitamin (MULTIVITAMIN PO) Take 1 tablet by mouth daily with breakfast.    Yes Historical Provider, MD  Omega-3 Fatty Acids (FISH OIL) 1200 MG CAPS Take 1 capsule by mouth 2 (two) times  daily.    Yes Historical Provider, MD  omeprazole (PRILOSEC) 20 MG capsule Take 20 mg by mouth daily with breakfast.    Yes Historical Provider, MD  pravastatin (PRAVACHOL) 20 MG tablet Take 20 mg by mouth daily with breakfast.   Yes Historical Provider, MD  verapamil (CALAN-SR) 240 MG CR tablet Take 240 mg by mouth at bedtime.    Yes Historical Provider, MD  Calcium Carb-Cholecalciferol 600-500 MG-UNIT CAPS Take 1 capsule by mouth 2 (two) times daily.     Historical Provider, MD  docusate sodium (COLACE) 100 MG capsule Take 100 mg by mouth 2 (two) times daily.    Historical  Provider, MD  Fluticasone-Salmeterol (ADVAIR DISKUS) 250-50 MCG/DOSE AEPB Inhale 1 puff into the lungs every 12 (twelve) hours as needed. FOR RESPIRATORY ISSUES    Historical Provider, MD  mometasone (NASONEX) 50 MCG/ACT nasal spray Place 2 sprays into the nose daily as needed. FOR ALLERGIES    Historical Provider, MD  traMADol (ULTRAM) 50 MG tablet Take 50 mg by mouth every 6 (six) hours as needed. FOR PAIN    Historical Provider, MD    Allergies  Allergen Reactions  . Codeine     REACTION: Lightheaded, nauses  . Demerol (Meperidine Hcl) Other (See Comments)    Dizziness     History   Social History  . Marital Status: Widowed    Spouse Name: N/A    Number of Children: 0  . Years of Education: N/A   Occupational History  .     Social History Main Topics  . Smoking status: Former Smoker    Quit date: 08/16/2001  . Smokeless tobacco: Never Used  . Alcohol Use: Yes     Comment: rare  . Drug Use: No  . Sexually Active: Not on file   Other Topics Concern  . Not on file   Social History Narrative   Lives: alone   Employment: retired.    Family History  Problem Relation Age of Onset  . Ovarian cancer Mother   . Colon cancer Neg Hx     Objective:   Physical Exam  Nursing note and vitals reviewed. Constitutional: She is oriented to person, place, and time. She appears well-developed and well-nourished. No distress.  HENT:  Head: Normocephalic and atraumatic.  Right Ear: External ear normal.  Left Ear: External ear normal.  Nose: Nose normal.  Mouth/Throat: Oropharynx is clear and moist.  Eyes: Conjunctivae and EOM are normal. Pupils are equal, round, and reactive to light.  Neck: Normal range of motion. Neck supple. No thyromegaly present.  Cardiovascular: Normal rate, regular rhythm, normal heart sounds and intact distal pulses.  Exam reveals no gallop and no friction rub.   No murmur heard. Pulmonary/Chest: Effort normal and breath sounds normal. She has no  wheezes. She has no rales.  Musculoskeletal:       Right shoulder: Normal.       Left shoulder: Normal.       Right wrist: Normal.       Left wrist: Normal.       Cervical back: Normal.  SKULL/SCALP:  NO SCALP WOUND/LESION.  L PARIETAL AREA WITH DEFINED RIDGE AND LOCALIZED SWELLING; +TTP.    Lymphadenopathy:    She has no cervical adenopathy.  Neurological: She is alert and oriented to person, place, and time. She has normal strength and normal reflexes. No cranial nerve deficit or sensory deficit. She exhibits normal muscle tone. She displays a negative Romberg sign. Coordination normal.  Skin: She is not diaphoretic.  Psychiatric: She has a normal mood and affect. Her behavior is normal. Judgment and thought content normal.       Assessment & Plan:  Headache - Plan: CT Head Wo Contrast  Fall from ladder, initial encounter - Plan: CT Head Wo Contrast  Contusion Head  1.  Headache:  New.   Secondary to head trauma.  Normal neurological exam; obtain CT head to rule out skull fracture and subdural hematoma. 2.  Fall from Ladder, Initial Encounter:  Fall with head trauma from six feet in air.  No lacerations; head contusion present. 3.  Head Contusion:  New.  Obtain CT head; normal neurological exam.  No evidence of major concussion at this time.

## 2012-12-04 DIAGNOSIS — S0003XA Contusion of scalp, initial encounter: Secondary | ICD-10-CM | POA: Diagnosis not present

## 2012-12-04 DIAGNOSIS — S1093XA Contusion of unspecified part of neck, initial encounter: Secondary | ICD-10-CM | POA: Diagnosis not present

## 2012-12-04 DIAGNOSIS — I10 Essential (primary) hypertension: Secondary | ICD-10-CM | POA: Diagnosis not present

## 2012-12-04 DIAGNOSIS — R42 Dizziness and giddiness: Secondary | ICD-10-CM | POA: Diagnosis not present

## 2012-12-27 DIAGNOSIS — D485 Neoplasm of uncertain behavior of skin: Secondary | ICD-10-CM | POA: Diagnosis not present

## 2012-12-27 DIAGNOSIS — Z85828 Personal history of other malignant neoplasm of skin: Secondary | ICD-10-CM | POA: Diagnosis not present

## 2012-12-27 DIAGNOSIS — L821 Other seborrheic keratosis: Secondary | ICD-10-CM | POA: Diagnosis not present

## 2012-12-27 DIAGNOSIS — D1801 Hemangioma of skin and subcutaneous tissue: Secondary | ICD-10-CM | POA: Diagnosis not present

## 2013-05-07 DIAGNOSIS — E785 Hyperlipidemia, unspecified: Secondary | ICD-10-CM | POA: Diagnosis not present

## 2013-05-07 DIAGNOSIS — I10 Essential (primary) hypertension: Secondary | ICD-10-CM | POA: Diagnosis not present

## 2013-05-14 ENCOUNTER — Other Ambulatory Visit: Payer: Self-pay | Admitting: Internal Medicine

## 2013-05-14 DIAGNOSIS — M129 Arthropathy, unspecified: Secondary | ICD-10-CM | POA: Diagnosis not present

## 2013-05-14 DIAGNOSIS — E785 Hyperlipidemia, unspecified: Secondary | ICD-10-CM | POA: Diagnosis not present

## 2013-05-14 DIAGNOSIS — Z6831 Body mass index (BMI) 31.0-31.9, adult: Secondary | ICD-10-CM | POA: Diagnosis not present

## 2013-05-14 DIAGNOSIS — Z1331 Encounter for screening for depression: Secondary | ICD-10-CM | POA: Diagnosis not present

## 2013-05-14 DIAGNOSIS — J45909 Unspecified asthma, uncomplicated: Secondary | ICD-10-CM | POA: Diagnosis not present

## 2013-05-14 DIAGNOSIS — K219 Gastro-esophageal reflux disease without esophagitis: Secondary | ICD-10-CM | POA: Diagnosis not present

## 2013-05-14 DIAGNOSIS — Z0000 Encounter for general adult medical examination without abnormal findings: Secondary | ICD-10-CM | POA: Diagnosis not present

## 2013-05-14 DIAGNOSIS — Z23 Encounter for immunization: Secondary | ICD-10-CM | POA: Diagnosis not present

## 2013-05-14 DIAGNOSIS — E041 Nontoxic single thyroid nodule: Secondary | ICD-10-CM

## 2013-05-14 DIAGNOSIS — I10 Essential (primary) hypertension: Secondary | ICD-10-CM | POA: Diagnosis not present

## 2013-05-14 DIAGNOSIS — E042 Nontoxic multinodular goiter: Secondary | ICD-10-CM | POA: Insufficient documentation

## 2013-05-18 ENCOUNTER — Ambulatory Visit
Admission: RE | Admit: 2013-05-18 | Discharge: 2013-05-18 | Disposition: A | Discharge status: Home / Self Care | Payer: Medicare Other | Source: Ambulatory Visit | Attending: Internal Medicine | Admitting: Internal Medicine

## 2013-05-18 DIAGNOSIS — E042 Nontoxic multinodular goiter: Secondary | ICD-10-CM | POA: Diagnosis not present

## 2013-05-18 DIAGNOSIS — E041 Nontoxic single thyroid nodule: Secondary | ICD-10-CM

## 2013-05-18 IMAGING — US US SOFT TISSUE HEAD/NECK
1 series · 13 of 25 positions shown · non-contrast
Comparison: [DATE] and earlier studies

CLINICAL DATA: Nodules. Previous FNA biopsy of dominant left lesion
[DATE].

EXAM:
THYROID ULTRASOUND
TECHNIQUE: Ultrasound examination of the thyroid gland and adjacent soft
tissues was performed.

[Series 1: us soft tissue head/neck · 0.09mm/px · 13 of 73 slices shown]
[im 1/73]
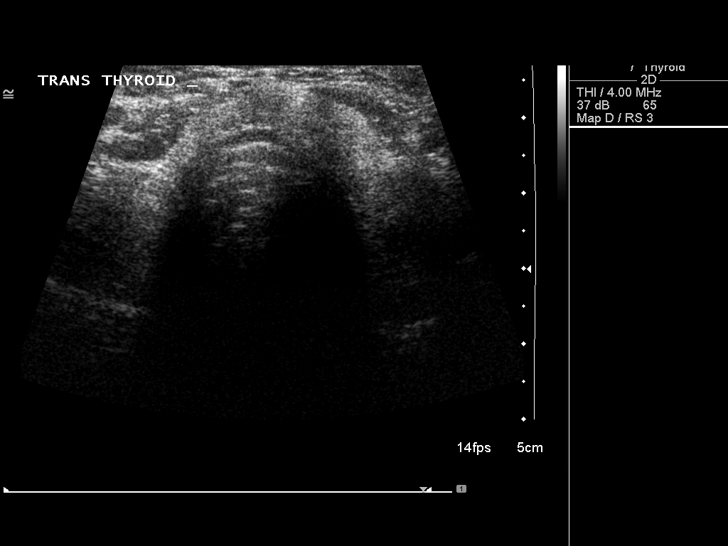
[im 7/73]
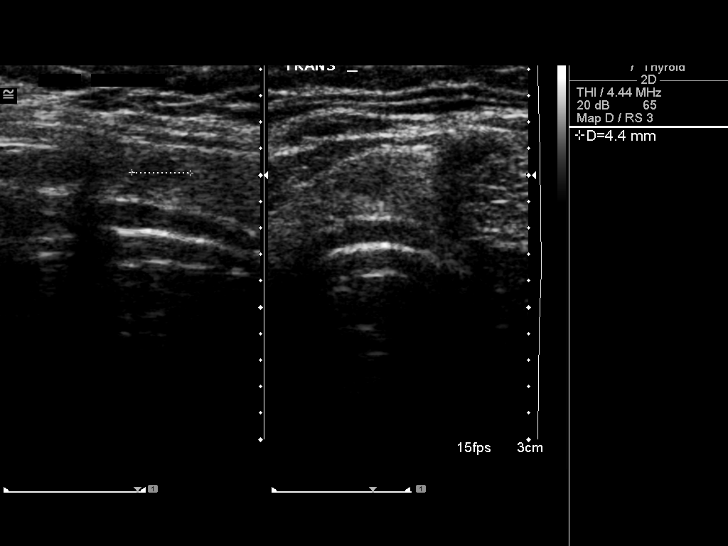
[im 13/73]
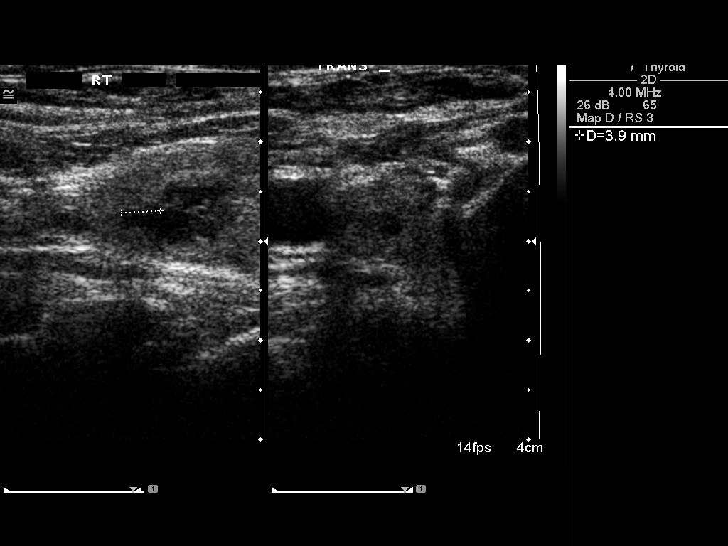
[im 19/73]
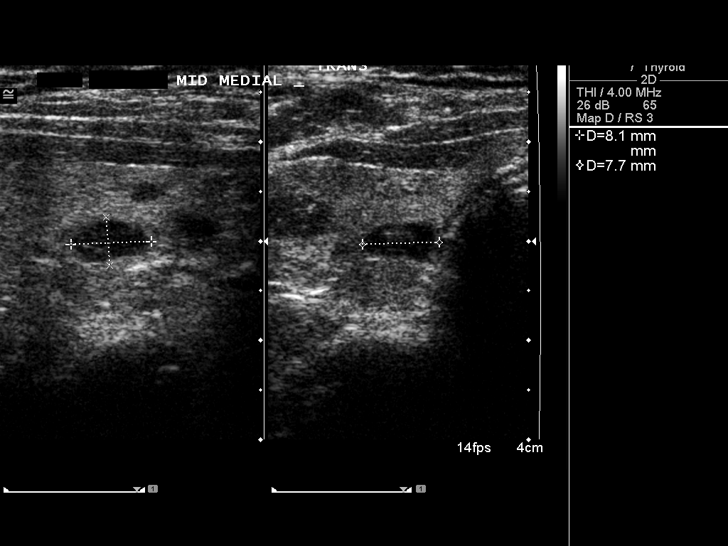
[im 25/73]
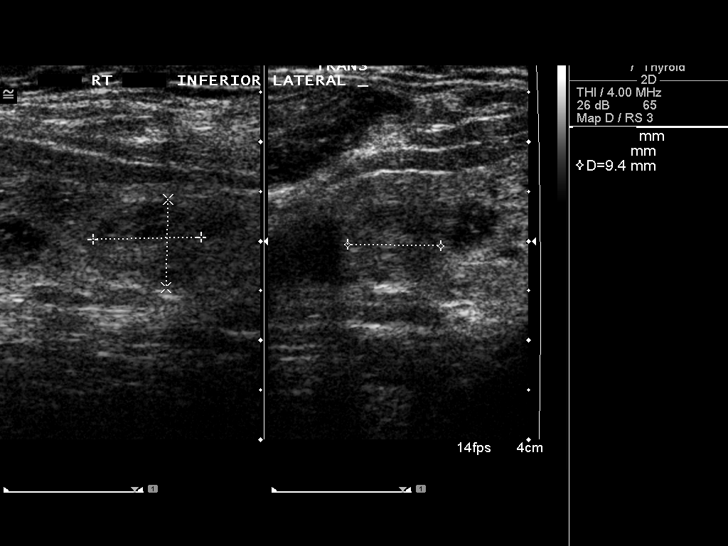
[im 31/73]
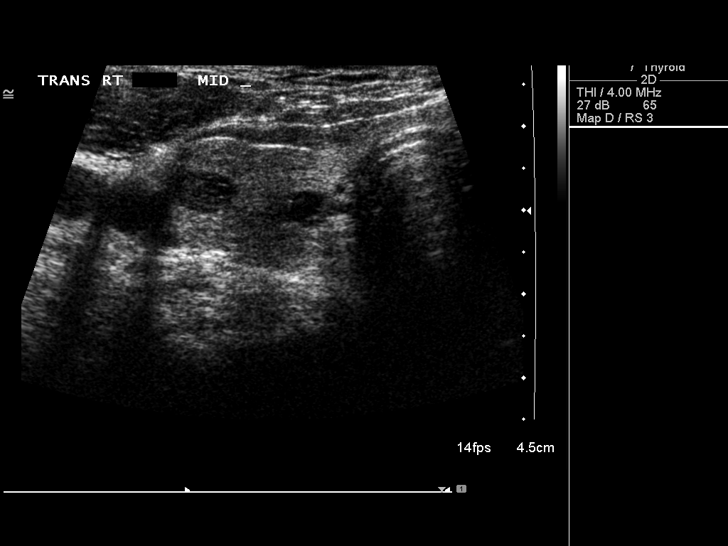
[im 37/73]
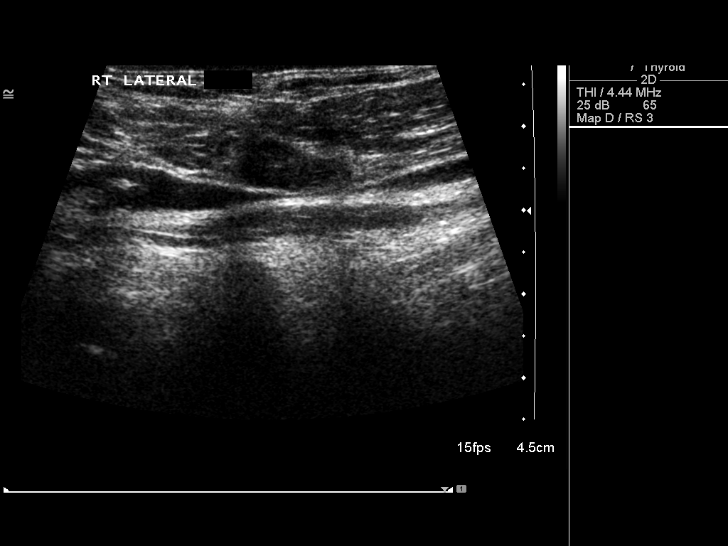
[im 43/73]
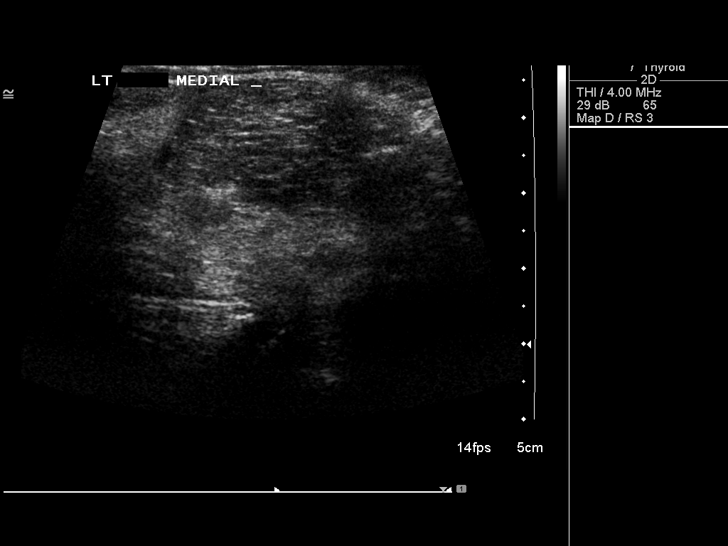
[im 49/73]
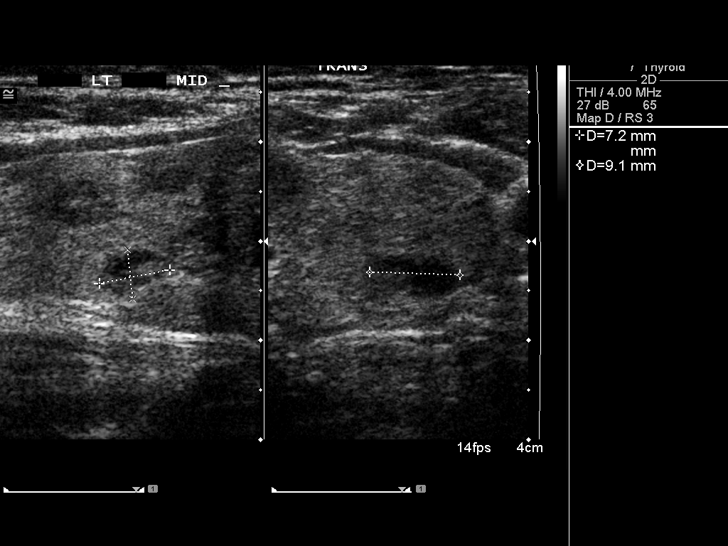
[im 55/73]
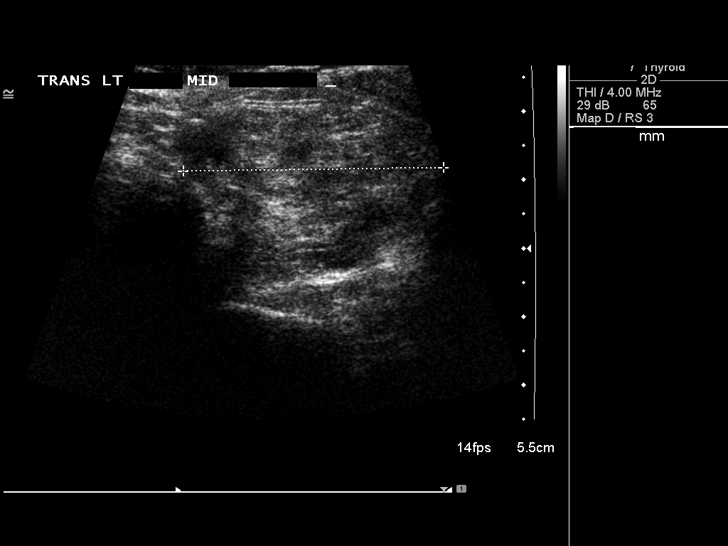
[im 61/73]
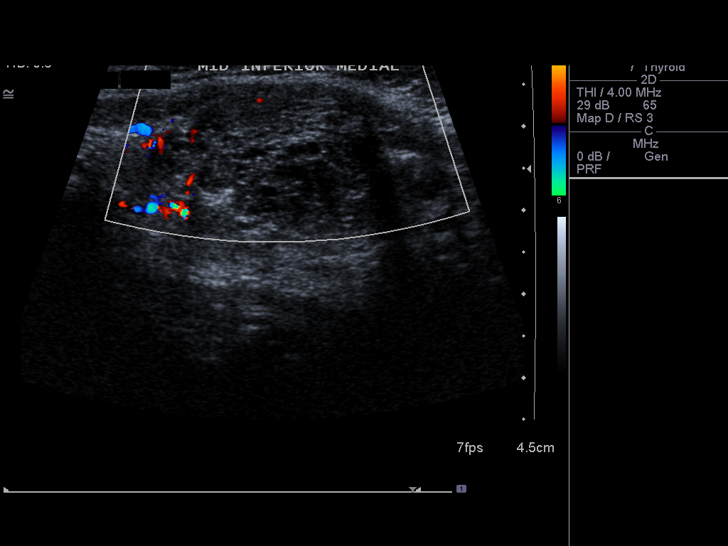
[im 67/73]
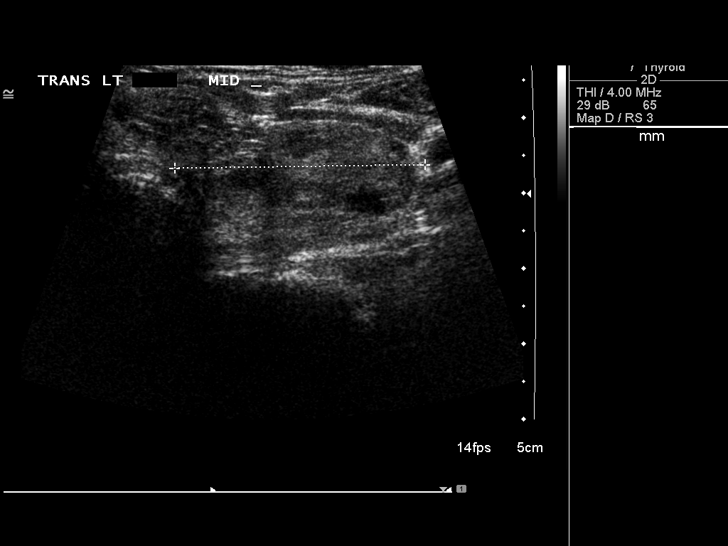
[im 73/73]
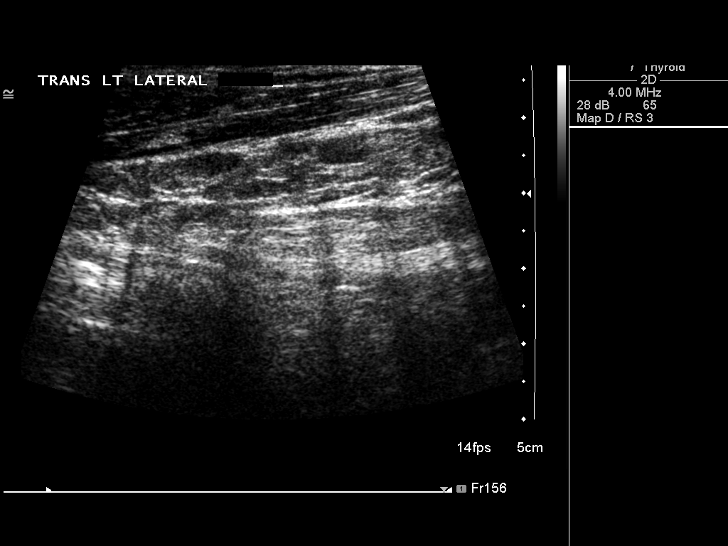

[13 of 25 positions shown; findings below may reference images not displayed]

FINDINGS: Right thyroid lobe

Measurements: 66 x 16 x 23 mm. Inhomogeneous echotexture. Multiple
small nodules. Largest in the upper pole 9 x 10 x 15 mm (previously
10 x 11 x 16).

9 x 9 x 11 mm solid, inferior pole

At least 8 additional small nodules, all less than 1 cm maximum
diameter.

Left thyroid lobe

Measurements: 68 x 27 x 33 mm. Multiple nodules. Largest 29 x 38 x
42 mm with scattered calcifications, inferior pole (previously 28 x
30 x 33).

5 x 7 x 11 mm solid, mid lobe

There are least 5 additional smaller nodules all less than 1 cm.

Isthmus

Thickness: 4.2 mm.  No nodules visualized.

Lymphadenopathy

None visualized.
IMPRESSION: 1. Multiple bilateral nodules, without significant enlargement of
any of the dominant lesions. Dominant right and left nodules meet
consensus criteria for biopsy. Correlate with previous biopsy of the
left lesion. Ultrasound-guided fine needle aspiration of other
dominant lesion(s) should be considered, as per the consensus
statement: Management of Thyroid Nodules Detected at US: Society of
Radiologists in Ultrasound Consensus Conference Statement. Radiology

## 2013-05-24 DIAGNOSIS — M6530 Trigger finger, unspecified finger: Secondary | ICD-10-CM | POA: Diagnosis not present

## 2013-05-28 DIAGNOSIS — Z1212 Encounter for screening for malignant neoplasm of rectum: Secondary | ICD-10-CM | POA: Diagnosis not present

## 2013-06-12 DIAGNOSIS — R928 Other abnormal and inconclusive findings on diagnostic imaging of breast: Secondary | ICD-10-CM | POA: Diagnosis not present

## 2013-08-15 DIAGNOSIS — R0602 Shortness of breath: Secondary | ICD-10-CM | POA: Diagnosis not present

## 2013-08-15 DIAGNOSIS — Z6833 Body mass index (BMI) 33.0-33.9, adult: Secondary | ICD-10-CM | POA: Diagnosis not present

## 2013-08-15 DIAGNOSIS — J45909 Unspecified asthma, uncomplicated: Secondary | ICD-10-CM | POA: Diagnosis not present

## 2013-08-15 DIAGNOSIS — R05 Cough: Secondary | ICD-10-CM | POA: Diagnosis not present

## 2013-10-10 DIAGNOSIS — L821 Other seborrheic keratosis: Secondary | ICD-10-CM | POA: Diagnosis not present

## 2013-10-10 DIAGNOSIS — L570 Actinic keratosis: Secondary | ICD-10-CM | POA: Diagnosis not present

## 2013-10-10 DIAGNOSIS — Z85828 Personal history of other malignant neoplasm of skin: Secondary | ICD-10-CM | POA: Diagnosis not present

## 2013-10-10 DIAGNOSIS — L909 Atrophic disorder of skin, unspecified: Secondary | ICD-10-CM | POA: Diagnosis not present

## 2013-10-12 DIAGNOSIS — H2510 Age-related nuclear cataract, unspecified eye: Secondary | ICD-10-CM | POA: Diagnosis not present

## 2013-10-12 DIAGNOSIS — H52209 Unspecified astigmatism, unspecified eye: Secondary | ICD-10-CM | POA: Diagnosis not present

## 2013-11-22 DIAGNOSIS — J45909 Unspecified asthma, uncomplicated: Secondary | ICD-10-CM | POA: Diagnosis not present

## 2013-11-22 DIAGNOSIS — K219 Gastro-esophageal reflux disease without esophagitis: Secondary | ICD-10-CM | POA: Diagnosis not present

## 2013-11-22 DIAGNOSIS — E785 Hyperlipidemia, unspecified: Secondary | ICD-10-CM | POA: Diagnosis not present

## 2013-11-22 DIAGNOSIS — Z6833 Body mass index (BMI) 33.0-33.9, adult: Secondary | ICD-10-CM | POA: Diagnosis not present

## 2013-11-22 DIAGNOSIS — I10 Essential (primary) hypertension: Secondary | ICD-10-CM | POA: Diagnosis not present

## 2013-12-26 DIAGNOSIS — D1801 Hemangioma of skin and subcutaneous tissue: Secondary | ICD-10-CM | POA: Diagnosis not present

## 2013-12-26 DIAGNOSIS — L821 Other seborrheic keratosis: Secondary | ICD-10-CM | POA: Diagnosis not present

## 2013-12-26 DIAGNOSIS — Z85828 Personal history of other malignant neoplasm of skin: Secondary | ICD-10-CM | POA: Diagnosis not present

## 2013-12-26 DIAGNOSIS — D239 Other benign neoplasm of skin, unspecified: Secondary | ICD-10-CM | POA: Diagnosis not present

## 2013-12-26 DIAGNOSIS — L819 Disorder of pigmentation, unspecified: Secondary | ICD-10-CM | POA: Diagnosis not present

## 2014-04-08 DIAGNOSIS — M76899 Other specified enthesopathies of unspecified lower limb, excluding foot: Secondary | ICD-10-CM | POA: Diagnosis not present

## 2014-05-16 DIAGNOSIS — I10 Essential (primary) hypertension: Secondary | ICD-10-CM | POA: Diagnosis not present

## 2014-05-16 DIAGNOSIS — E042 Nontoxic multinodular goiter: Secondary | ICD-10-CM | POA: Diagnosis not present

## 2014-05-16 DIAGNOSIS — R8299 Other abnormal findings in urine: Secondary | ICD-10-CM | POA: Diagnosis not present

## 2014-05-16 DIAGNOSIS — Z23 Encounter for immunization: Secondary | ICD-10-CM | POA: Diagnosis not present

## 2014-05-16 DIAGNOSIS — M25552 Pain in left hip: Secondary | ICD-10-CM | POA: Diagnosis not present

## 2014-05-16 DIAGNOSIS — M76892 Other specified enthesopathies of left lower limb, excluding foot: Secondary | ICD-10-CM | POA: Diagnosis not present

## 2014-05-22 ENCOUNTER — Ambulatory Visit: Payer: Medicare Other | Attending: Orthopaedic Surgery | Admitting: Physical Therapy

## 2014-05-22 DIAGNOSIS — Z5189 Encounter for other specified aftercare: Secondary | ICD-10-CM | POA: Diagnosis not present

## 2014-05-22 DIAGNOSIS — M7072 Other bursitis of hip, left hip: Secondary | ICD-10-CM | POA: Diagnosis not present

## 2014-05-22 DIAGNOSIS — Z6833 Body mass index (BMI) 33.0-33.9, adult: Secondary | ICD-10-CM | POA: Diagnosis not present

## 2014-05-22 DIAGNOSIS — Z23 Encounter for immunization: Secondary | ICD-10-CM | POA: Diagnosis not present

## 2014-05-22 DIAGNOSIS — J45909 Unspecified asthma, uncomplicated: Secondary | ICD-10-CM | POA: Insufficient documentation

## 2014-05-22 DIAGNOSIS — M7540 Impingement syndrome of unspecified shoulder: Secondary | ICD-10-CM | POA: Insufficient documentation

## 2014-05-22 DIAGNOSIS — I10 Essential (primary) hypertension: Secondary | ICD-10-CM | POA: Diagnosis not present

## 2014-05-22 DIAGNOSIS — Z0000 Encounter for general adult medical examination without abnormal findings: Secondary | ICD-10-CM | POA: Diagnosis not present

## 2014-05-22 DIAGNOSIS — E785 Hyperlipidemia, unspecified: Secondary | ICD-10-CM | POA: Diagnosis not present

## 2014-05-24 DIAGNOSIS — Z1212 Encounter for screening for malignant neoplasm of rectum: Secondary | ICD-10-CM | POA: Diagnosis not present

## 2014-05-30 ENCOUNTER — Ambulatory Visit: Payer: Medicare Other | Admitting: Physical Therapy

## 2014-05-30 DIAGNOSIS — Z5189 Encounter for other specified aftercare: Secondary | ICD-10-CM | POA: Diagnosis not present

## 2014-06-04 ENCOUNTER — Ambulatory Visit: Payer: Medicare Other | Admitting: Physical Therapy

## 2014-06-04 DIAGNOSIS — Z5189 Encounter for other specified aftercare: Secondary | ICD-10-CM | POA: Diagnosis not present

## 2014-06-06 ENCOUNTER — Ambulatory Visit: Payer: Medicare Other | Admitting: Physical Therapy

## 2014-06-06 DIAGNOSIS — Z5189 Encounter for other specified aftercare: Secondary | ICD-10-CM | POA: Diagnosis not present

## 2014-06-11 ENCOUNTER — Ambulatory Visit: Payer: Medicare Other | Admitting: Physical Therapy

## 2014-06-11 DIAGNOSIS — Z5189 Encounter for other specified aftercare: Secondary | ICD-10-CM | POA: Diagnosis not present

## 2014-06-13 ENCOUNTER — Ambulatory Visit: Payer: Medicare Other | Admitting: Physical Therapy

## 2014-06-13 DIAGNOSIS — Z1231 Encounter for screening mammogram for malignant neoplasm of breast: Secondary | ICD-10-CM | POA: Diagnosis not present

## 2014-06-13 DIAGNOSIS — Z5189 Encounter for other specified aftercare: Secondary | ICD-10-CM | POA: Diagnosis not present

## 2014-06-19 ENCOUNTER — Ambulatory Visit: Payer: Medicare Other | Attending: Orthopaedic Surgery | Admitting: Physical Therapy

## 2014-06-19 ENCOUNTER — Encounter: Payer: Self-pay | Admitting: Physical Therapy

## 2014-06-19 DIAGNOSIS — I10 Essential (primary) hypertension: Secondary | ICD-10-CM | POA: Diagnosis not present

## 2014-06-19 DIAGNOSIS — J45909 Unspecified asthma, uncomplicated: Secondary | ICD-10-CM | POA: Diagnosis not present

## 2014-06-19 DIAGNOSIS — M25552 Pain in left hip: Secondary | ICD-10-CM

## 2014-06-19 DIAGNOSIS — Z5189 Encounter for other specified aftercare: Secondary | ICD-10-CM | POA: Insufficient documentation

## 2014-06-19 DIAGNOSIS — M7072 Other bursitis of hip, left hip: Secondary | ICD-10-CM | POA: Insufficient documentation

## 2014-06-19 DIAGNOSIS — M7540 Impingement syndrome of unspecified shoulder: Secondary | ICD-10-CM | POA: Insufficient documentation

## 2014-06-19 NOTE — Therapy (Signed)
Physical Therapy Treatment  Patient Details  Name: Cassidy Garcia MRN: [id number] Date of Birth: 1945-10-02  Encounter Date: 06/19/2014      PT End of Session - 06/19/14 0846    Visit Number 7   Number of Visits 16   Date for PT Re-Evaluation 07/17/14   PT Start Time 0804   PT Stop Time 0846   PT Time Calculation (min) 42 min   Activity Tolerance Patient tolerated treatment well  Needs encouragement      Past Medical History  Diagnosis Date  . Diverticulosis   . Hemorrhoids   . Hyperlipidemia   . GERD (gastroesophageal reflux disease)   . Hypertension   . Colon polyps     tubular adenoma  . Hypothyroidism   . Asthma   . Depression   . Arthritis   . Shortness of breath     with exertion on occasion  . Constipation   . Skin cancer     Past Surgical History  Procedure Laterality Date  . Nasal sinus surgery    . Abdominal hysterectomy    . Cholecystectomy    . Colonoscopy  02/12/2011  . Polypectomy    . Hand surgery    . Feet surgery    . Total hip arthroplasty  12/10/2011    Procedure: TOTAL HIP ARTHROPLASTY ANTERIOR APPROACH;  Surgeon: Mcarthur Rossetti, MD;  Location: [facility];  Service: Orthopedics;  Laterality: Right;  Right Total Hip Arthroplasty, Anterior Approach    There were no vitals taken for this visit.  Visit Diagnosis:  Hip pain, left      Subjective Assessment - 06/19/14 0808    Symptoms Occ shoulder pain with certain movements, pain resolving in Lt. hip, resolving. Patient reports she can go up stairs with Lt. LE first.    Limitations Walking;Standing;Lifting   Currently in Pain? No/denies          [facility] PT Assessment - 06/19/14 0800    Assessment   Next MD Visit 07/01/14          Adult PT Treatment/Exercise - 06/19/14 0700    Lumbar Exercises: Aerobic   Stationary Bike --  NuStep Level 6-7, 6 min   Lumbar Exercises: Supine   Bridge 10 reps;5 seconds  on swiss ball   Straight Leg Raise 5 reps;5 seconds  bilateral   Other  Supine Lumbar Exercises --  trunk rotation on ball   Knee/Hip Exercises: Stretches   Active Hamstring Stretch 30 seconds;3 reps   Knee: Self-Stretch to increase Flexion 30 seconds;3 reps   ITB Stretch 30 seconds;3 reps   Piriformis Stretch 10 seconds;3 reps   Knee/Hip Exercises: Standing   Other Standing Knee Exercises --  4 Way SLR with yellow band, UE assist done bilaterally.           Education - 06/19/14 0955    Education provided No          PT Short Term Goals - 06/19/14 0945    PT SHORT TERM GOAL #1   Title be I with initial HEP    Time 4   Period Weeks   Status Achieved   PT SHORT TERM GOAL #2   Title report no increae in shoulder pain with typical housework using Rt. UE   Time 4   Period Weeks   Status Achieved   PT SHORT TERM GOAL #3   Title achieve equal AROM in B Shoulders with no more than min discomfort   Time 4  Period Weeks   Status Achieved   PT SHORT TERM GOAL #4   Title sit for 30 min and report pain in hip <5/10 when standing   Time 4   Period Weeks   Status Achieved   PT SHORT TERM GOAL #5   Title walk up 3-5 steps with UE support and report mod pain <5/10   Time 4   Period Weeks   Status Achieved          PT Long Term Goals - 06/19/14 [id number]    PT LONG TERM GOAL #1   Title Demo techniques to reduce risk of reinjury to include posture, body mechanics, lifting.    Time 8   Period Weeks   Status On-going   PT LONG TERM GOAL #2   Title be I with advanced HEP   Time 8   Period Weeks   Status On-going   PT LONG TERM GOAL #3   Title sit for 30 min and report pain in hip <3/10   Time 8   Period Weeks   Status On-going   PT LONG TERM GOAL #4   Title lie on Lt. side for short periods in the night and report min discomfort   Time 8   Period Weeks   Status Achieved   PT LONG TERM GOAL #5   Title walk up and down steps in home with min pain/difficulty   Time 8   Period Weeks   Status Achieved          Plan - 06/19/14 0846     Clinical Impression Statement This patient presents with improvement in pain and stair negotiation.  She can now step up with Lt. LE on stairs.  She has no pain in rt. UE unless she strains as she lifts.  She feels comfortable with finishing this episode of PT after next weel.     Pt will benefit from skilled therapeutic intervention in order to improve on the following deficits Decreased mobility;Decreased strength;Cardiopulmonary status limiting activity;Difficulty walking   Rehab Potential Excellent   PT Frequency Min 2X/week   PT Duration Other (comment)  1 more week   PT Treatment/Interventions Therapeutic exercise;Manual techniques;Stair training;Modalities;Therapeutic activities;Patient/family education   PT Plan cont hip glute med strength        Problem List Patient Active Problem List   Diagnosis Date Noted  . Degenerative arthritis of hip 12/10/2011  . Chest pain 07/06/2011  . HTN (hypertension) 07/06/2011  . Hyperlipidemia 07/06/2011                                            PAA,JENNIFER 06/19/2014, 9:57 AM

## 2014-06-21 ENCOUNTER — Ambulatory Visit: Payer: Medicare Other | Admitting: Physical Therapy

## 2014-06-21 DIAGNOSIS — M25552 Pain in left hip: Secondary | ICD-10-CM

## 2014-06-21 DIAGNOSIS — M7540 Impingement syndrome of unspecified shoulder: Secondary | ICD-10-CM | POA: Diagnosis not present

## 2014-06-21 DIAGNOSIS — J45909 Unspecified asthma, uncomplicated: Secondary | ICD-10-CM | POA: Diagnosis not present

## 2014-06-21 DIAGNOSIS — M7072 Other bursitis of hip, left hip: Secondary | ICD-10-CM | POA: Diagnosis not present

## 2014-06-21 DIAGNOSIS — Z5189 Encounter for other specified aftercare: Secondary | ICD-10-CM | POA: Diagnosis not present

## 2014-06-21 DIAGNOSIS — I10 Essential (primary) hypertension: Secondary | ICD-10-CM | POA: Diagnosis not present

## 2014-06-21 NOTE — Therapy (Signed)
Physical Therapy Treatment  Patient Details  Name: Cassidy Garcia MRN: [id number] Date of Birth: 06-29-1946  Encounter Date: 06/21/2014      PT End of Session - 06/21/14 1057    Visit Number 8   Number of Visits 16      Past Medical History  Diagnosis Date  . Diverticulosis   . Hemorrhoids   . Hyperlipidemia   . GERD (gastroesophageal reflux disease)   . Hypertension   . Colon polyps     tubular adenoma  . Hypothyroidism   . Asthma   . Depression   . Arthritis   . Shortness of breath     with exertion on occasion  . Constipation   . Skin cancer     Past Surgical History  Procedure Laterality Date  . Nasal sinus surgery    . Abdominal hysterectomy    . Cholecystectomy    . Colonoscopy  02/12/2011  . Polypectomy    . Hand surgery    . Feet surgery    . Total hip arthroplasty  12/10/2011    Procedure: TOTAL HIP ARTHROPLASTY ANTERIOR APPROACH;  Surgeon: Mcarthur Rossetti, MD;  Location: [facility];  Service: Orthopedics;  Laterality: Right;  Right Total Hip Arthroplasty, Anterior Approach    There were no vitals taken for this visit.  Visit Diagnosis:  Hip pain, left      Subjective Assessment - 06/21/14 1022    Symptoms Sore and stiff today. I think we overdid it.   Currently in Pain? Yes   Pain Location Hip   Pain Orientation Left;Lateral   Pain Descriptors / Indicators Sore   Pain Type Chronic pain   Multiple Pain Sites Yes   Pain Type Neuropathic pain   Pain Location Calf   Pain Orientation Left   Pain Frequency Occasional   Pain Onset With Activity            [facility] Adult PT Treatment/Exercise - 06/21/14 1023    Exercises   Exercises --  NuStep level 5, 5 min   Lumbar Exercises: Supine   Other Supine Lumbar Exercises --  decompression Lt. LE (lengthening and hip ext isometric) x5    Other Supine Lumbar Exercises single knee to chest  crossover stretch to Rt. for piriformis   Lumbar Exercises: Sidelying   Other Sidelying Lumbar Exercises  upper trunk rotaition x3 each   Knee/Hip Exercises: Seated   Other Seated Knee Exercises hip ABD and ER with band red x20   Shoulder Exercises: Supine   Horizontal ABduction Strengthening;Both;20 reps;Theraband   Other Supine Exercises --  Diagonal pull red theraband x10 each side          Education - 06/21/14 1056    Education provided Yes   Education Details new UE HEP bands   Education Details Patient   Methods Explanation;Demonstration   Comprehension Verbalized understanding;Returned demonstration              Plan - 06/21/14 1057    Clinical Impression Statement Pt with increased pain (reports as muscle soreness) today.  Asked for UE exercises for maintenanence of Rt. shoulder pain.     Pt will benefit from skilled therapeutic intervention in order to improve on the following deficits Decreased strength;Decreased mobility;Difficulty walking   Rehab Potential Excellent   PT Plan check full HEP next week        Problem List Patient Active Problem List   Diagnosis Date Noted  . Degenerative arthritis of hip 12/10/2011  .  Chest pain 07/06/2011  . HTN (hypertension) 07/06/2011  . Hyperlipidemia 07/06/2011                                            PAA,JENNIFER 06/21/2014, 10:59 AM

## 2014-06-21 NOTE — Patient Instructions (Addendum)
PNF Strengthening: Resisted   Standing or Lying DOWN with resistive band around each hand, bring right arm up and away, thumb back. Repeat 10____ times per set. Do __1-2__ sets per session. Do ___1-2_ sessions per day.  http://orth.exer.us/919   Copyright  VHI. All rights reserved.  Resisted Horizontal Abduction: Bilateral   Sit or stand, tubing in both hands, arms out in front. Keeping arms straight, pinch shoulder blades together and stretch arms out. Repeat __10-20__ times per set. Do _1___ sets per session. Do ___1_ sessions per day.  http://orth.exer.us/969   Copyright  VHI. All rights reserved.

## 2014-06-27 ENCOUNTER — Ambulatory Visit: Payer: Medicare Other | Admitting: Physical Therapy

## 2014-06-27 DIAGNOSIS — Z5189 Encounter for other specified aftercare: Secondary | ICD-10-CM | POA: Diagnosis not present

## 2014-06-27 DIAGNOSIS — J45909 Unspecified asthma, uncomplicated: Secondary | ICD-10-CM | POA: Diagnosis not present

## 2014-06-27 DIAGNOSIS — M25552 Pain in left hip: Secondary | ICD-10-CM

## 2014-06-27 DIAGNOSIS — M7540 Impingement syndrome of unspecified shoulder: Secondary | ICD-10-CM | POA: Diagnosis not present

## 2014-06-27 DIAGNOSIS — I10 Essential (primary) hypertension: Secondary | ICD-10-CM | POA: Diagnosis not present

## 2014-06-27 DIAGNOSIS — M7072 Other bursitis of hip, left hip: Secondary | ICD-10-CM | POA: Diagnosis not present

## 2014-06-27 NOTE — Therapy (Signed)
Physical Therapy Treatment  Patient Details  Name: Cassidy Garcia MRN: [id number] Date of Birth: 01-06-1946  Encounter Date: 06/27/2014      PT End of Session - 06/27/14 1124    Visit Number 9   Number of Visits 16   Date for PT Re-Evaluation 07/17/14   PT Start Time 1017   PT Stop Time 1058   PT Time Calculation (min) 41 min      Past Medical History  Diagnosis Date  . Diverticulosis   . Hemorrhoids   . Hyperlipidemia   . GERD (gastroesophageal reflux disease)   . Hypertension   . Colon polyps     tubular adenoma  . Hypothyroidism   . Asthma   . Depression   . Arthritis   . Shortness of breath     with exertion on occasion  . Constipation   . Skin cancer     Past Surgical History  Procedure Laterality Date  . Nasal sinus surgery    . Abdominal hysterectomy    . Cholecystectomy    . Colonoscopy  02/12/2011  . Polypectomy    . Hand surgery    . Feet surgery    . Total hip arthroplasty  12/10/2011    Procedure: TOTAL HIP ARTHROPLASTY ANTERIOR APPROACH;  Surgeon: Mcarthur Rossetti, MD;  Location: [facility];  Service: Orthopedics;  Laterality: Right;  Right Total Hip Arthroplasty, Anterior Approach    There were no vitals taken for this visit.  Visit Diagnosis:  Hip pain, left      Subjective Assessment - 06/27/14 1020    Symptoms She had visitors so did not do exercises as much.  Does not feel she needs to rewiew all her home exercises, she does well with them.  Able to do walking    Currently in Pain? Yes   Pain Score 4    Pain Orientation Left   Pain Descriptors / Indicators Tightness   Multiple Pain Sites No            [facility] Adult PT Treatment/Exercise - 06/27/14 1020    Lumbar Exercises: Machines for Strengthening   Leg Press 1 plate 10 X 2   Other Lumbar Machine Exercise Nustep level 6, 8 minutes   Lumbar Exercises: Supine   Other Supine Lumbar Exercises ball squeeze, hip abduction with strap for resistance 5 second holds 10 reps each   Knee/Hip Exercises: Standing   Side Lunges Limitations 1 rep 25 seconds each side   SLS --  on Lt sliding rt foot as far as able three ways 10 reps each   Other Standing Knee Exercises Hip Extension 2 plates Lt, 1 Plate flexion Lt.  Flexion Rt with 1 plate painful Rt lateral hip so stopped at 3 reps, otherwise did 10 reps            PT Short Term Goals - 06/27/14 1132    PT SHORT TERM GOAL #5   Title walk up 3-5 steps with UE support and report mod pain <5/10   Time 4   Period Weeks   Status Achieved          PT Long Term Goals - 06/27/14 1133    PT LONG TERM GOAL #5   Title walk up and down steps in home with min pain/difficulty   Time 8   Period Weeks   Status Achieved          Plan - 06/27/14 1125    Clinical Impression Statement More  functional in community, LTG# 5 met. Focus today was on strengthening without pain increase.   PT Plan Patient thinks she will need only one more visit. (She declined review of full home exercises today due to no qustions at all about them) Check goals  FOTO and D/C?        Problem List Patient Active Problem List   Diagnosis Date Noted  . Degenerative arthritis of hip 12/10/2011  . Chest pain 07/06/2011  . HTN (hypertension) 07/06/2011  . Hyperlipidemia 07/06/2011                                              HARRIS,KAREN PTA 06/27/2014, 11:38 AM

## 2014-06-28 ENCOUNTER — Encounter: Payer: Self-pay | Admitting: Physical Therapy

## 2014-06-28 ENCOUNTER — Ambulatory Visit: Payer: Medicare Other | Admitting: Physical Therapy

## 2014-06-28 DIAGNOSIS — M25552 Pain in left hip: Secondary | ICD-10-CM

## 2014-06-28 DIAGNOSIS — I10 Essential (primary) hypertension: Secondary | ICD-10-CM | POA: Diagnosis not present

## 2014-06-28 DIAGNOSIS — J45909 Unspecified asthma, uncomplicated: Secondary | ICD-10-CM | POA: Diagnosis not present

## 2014-06-28 DIAGNOSIS — M7540 Impingement syndrome of unspecified shoulder: Secondary | ICD-10-CM | POA: Diagnosis not present

## 2014-06-28 DIAGNOSIS — M7072 Other bursitis of hip, left hip: Secondary | ICD-10-CM | POA: Diagnosis not present

## 2014-06-28 DIAGNOSIS — Z5189 Encounter for other specified aftercare: Secondary | ICD-10-CM | POA: Diagnosis not present

## 2014-06-28 NOTE — Therapy (Signed)
Physical Therapy Treatment  Patient Details  Name: Cassidy Garcia MRN: [id number] Date of Birth: May 01, 1946  Encounter Date: 06/28/2014      PT End of Session - 06/28/14 1055    Visit Number 10   Number of Visits 16   PT Start Time 8101   PT Stop Time 1056   PT Time Calculation (min) 38 min   Activity Tolerance Patient tolerated treatment well      Past Medical History  Diagnosis Date  . Diverticulosis   . Hemorrhoids   . Hyperlipidemia   . GERD (gastroesophageal reflux disease)   . Hypertension   . Colon polyps     tubular adenoma  . Hypothyroidism   . Asthma   . Depression   . Arthritis   . Shortness of breath     with exertion on occasion  . Constipation   . Skin cancer     Past Surgical History  Procedure Laterality Date  . Nasal sinus surgery    . Abdominal hysterectomy    . Cholecystectomy    . Colonoscopy  02/12/2011  . Polypectomy    . Hand surgery    . Feet surgery    . Total hip arthroplasty  12/10/2011    Procedure: TOTAL HIP ARTHROPLASTY ANTERIOR APPROACH;  Surgeon: Mcarthur Rossetti, MD;  Location: [facility];  Service: Orthopedics;  Laterality: Right;  Right Total Hip Arthroplasty, Anterior Approach    There were no vitals taken for this visit.  Visit Diagnosis:  Hip pain, left      Subjective Assessment - 06/28/14 1030    Symptoms Feeling a little sore today from all the walking Ive been doing.    Currently in Pain? Yes   Pain Score 1    Pain Location Hip   Pain Orientation Left   Multiple Pain Sites No            [facility] Adult PT Treatment/Exercise - 06/28/14 1039    Lumbar Exercises: Supine   Clam 10 reps  with blue theraband   Bridge 10 reps  2 sets   Straight Leg Raise 10 reps  each LE   Other Supine Lumbar Exercises --  bridge with ABD/ER and blue band   Knee/Hip Exercises: Stretches   Active Hamstring Stretch Limitations --  3 reps 30 sec    ITB Stretch 2 reps;30 seconds   Piriformis Stretch 3 reps;30 seconds   Knee/Hip Exercises: Aerobic   Stationary Bike --  NuStep level 6 for 6 min          PT Education - 06/28/14 1108    Education provided Yes   Education Details full HEP review, DC summary and how to advance   Person(s) Educated Patient   Methods Explanation;Demonstration;Verbal cues   Comprehension Verbalized understanding;Returned demonstration          PT Short Term Goals - 06/28/14 1059    PT SHORT TERM GOAL #1   Title be I with initial HEP    Status Achieved   PT SHORT TERM GOAL #2   Title report no increae in shoulder pain with typical housework using Rt. UE   Status Achieved   PT SHORT TERM GOAL #3   Title achieve equal AROM in B Shoulders with no more than min discomfort   Status Achieved   PT SHORT TERM GOAL #4   Title sit for 30 min and report pain in hip <5/10 when standing   Status Achieved  PT Long Term Goals - 07-20-14 1059    PT LONG TERM GOAL #1   Title Demo techniques to reduce risk of reinjury to include posture, body mechanics, lifting.    Status Achieved   PT LONG TERM GOAL #2   Title be I with advanced HEP   Status Achieved   PT LONG TERM GOAL #3   Title sit for 30 min and report pain in hip <3/10   Status Achieved   PT LONG TERM GOAL #4   Title lie on Lt. side for short periods in the night and report min discomfort   Status Achieved          Plan - 07-20-2014 1057    Clinical Impression Statement Ms. Witucki states she is ready to DC, hip and shoulder are 90% improved.     PT Plan DC today          G-Codes - 07-20-14 1109    Functional Assessment Tool Used FOTO   Functional Limitation Mobility: Walking and moving around   Mobility: Walking and Moving Around Current Status [phone]) At least 20 percent but less than 40 percent impaired, limited or restricted   Mobility: Walking and Moving Around Goal Status [phone]) At least 20 percent but less than 40 percent impaired, limited or restricted   Mobility: Walking and Moving  Around Discharge Status [phone]) At least 20 percent but less than 40 percent impaired, limited or restricted      Problem List Patient Active Problem List   Diagnosis Date Noted  . Degenerative arthritis of hip 12/10/2011  . Chest pain 07/06/2011  . HTN (hypertension) 07/06/2011  . Hyperlipidemia 07/06/2011       PHYSICAL THERAPY DISCHARGE SUMMARY  Visits from Start of Care: 10  Current functional level related to goals / functional outcomes: All goals met, see above for goals    Remaining deficits: None limiting function   Education / Equipment: HEP, posture and body mechanics  Plan: Patient agrees to discharge.  Patient goals were met. Patient is being discharged due to meeting the stated rehab goals.  ?????         Raeford Razor, PT 07/20/2014, 11:10 AM

## 2014-07-01 DIAGNOSIS — M76892 Other specified enthesopathies of left lower limb, excluding foot: Secondary | ICD-10-CM | POA: Diagnosis not present

## 2014-07-01 DIAGNOSIS — M25552 Pain in left hip: Secondary | ICD-10-CM | POA: Diagnosis not present

## 2014-07-08 DIAGNOSIS — M76892 Other specified enthesopathies of left lower limb, excluding foot: Secondary | ICD-10-CM | POA: Diagnosis not present

## 2014-07-08 DIAGNOSIS — M25552 Pain in left hip: Secondary | ICD-10-CM | POA: Diagnosis not present

## 2014-07-29 DIAGNOSIS — M76892 Other specified enthesopathies of left lower limb, excluding foot: Secondary | ICD-10-CM | POA: Diagnosis not present

## 2014-07-29 DIAGNOSIS — M25552 Pain in left hip: Secondary | ICD-10-CM | POA: Diagnosis not present

## 2014-10-25 DIAGNOSIS — H532 Diplopia: Secondary | ICD-10-CM | POA: Diagnosis not present

## 2014-10-25 DIAGNOSIS — H2513 Age-related nuclear cataract, bilateral: Secondary | ICD-10-CM | POA: Diagnosis not present

## 2014-11-25 DIAGNOSIS — M1990 Unspecified osteoarthritis, unspecified site: Secondary | ICD-10-CM | POA: Diagnosis not present

## 2014-11-25 DIAGNOSIS — N342 Other urethritis: Secondary | ICD-10-CM | POA: Diagnosis not present

## 2014-11-25 DIAGNOSIS — J45909 Unspecified asthma, uncomplicated: Secondary | ICD-10-CM | POA: Diagnosis not present

## 2014-11-25 DIAGNOSIS — I10 Essential (primary) hypertension: Secondary | ICD-10-CM | POA: Diagnosis not present

## 2014-11-25 DIAGNOSIS — E785 Hyperlipidemia, unspecified: Secondary | ICD-10-CM | POA: Diagnosis not present

## 2014-11-25 DIAGNOSIS — Z1389 Encounter for screening for other disorder: Secondary | ICD-10-CM | POA: Diagnosis not present

## 2014-11-25 DIAGNOSIS — Z6832 Body mass index (BMI) 32.0-32.9, adult: Secondary | ICD-10-CM | POA: Diagnosis not present

## 2014-12-31 DIAGNOSIS — L720 Epidermal cyst: Secondary | ICD-10-CM | POA: Diagnosis not present

## 2014-12-31 DIAGNOSIS — I10 Essential (primary) hypertension: Secondary | ICD-10-CM | POA: Diagnosis not present

## 2014-12-31 DIAGNOSIS — L821 Other seborrheic keratosis: Secondary | ICD-10-CM | POA: Diagnosis not present

## 2014-12-31 DIAGNOSIS — D1801 Hemangioma of skin and subcutaneous tissue: Secondary | ICD-10-CM | POA: Diagnosis not present

## 2014-12-31 DIAGNOSIS — Z85828 Personal history of other malignant neoplasm of skin: Secondary | ICD-10-CM | POA: Diagnosis not present

## 2015-02-16 ENCOUNTER — Emergency Department (HOSPITAL_COMMUNITY)
Admission: EM | Admit: 2015-02-16 | Discharge: 2015-02-16 | Disposition: A | Discharge status: Home / Self Care | Payer: Medicare Other | Attending: Emergency Medicine | Admitting: Emergency Medicine

## 2015-02-16 ENCOUNTER — Emergency Department (HOSPITAL_COMMUNITY): Payer: Medicare Other

## 2015-02-16 ENCOUNTER — Encounter (HOSPITAL_COMMUNITY): Payer: Self-pay | Admitting: *Deleted

## 2015-02-16 DIAGNOSIS — Z87891 Personal history of nicotine dependence: Secondary | ICD-10-CM | POA: Insufficient documentation

## 2015-02-16 DIAGNOSIS — Z7982 Long term (current) use of aspirin: Secondary | ICD-10-CM | POA: Insufficient documentation

## 2015-02-16 DIAGNOSIS — K219 Gastro-esophageal reflux disease without esophagitis: Secondary | ICD-10-CM | POA: Insufficient documentation

## 2015-02-16 DIAGNOSIS — K449 Diaphragmatic hernia without obstruction or gangrene: Secondary | ICD-10-CM | POA: Diagnosis not present

## 2015-02-16 DIAGNOSIS — I10 Essential (primary) hypertension: Secondary | ICD-10-CM | POA: Diagnosis not present

## 2015-02-16 DIAGNOSIS — E039 Hypothyroidism, unspecified: Secondary | ICD-10-CM | POA: Diagnosis not present

## 2015-02-16 DIAGNOSIS — J45909 Unspecified asthma, uncomplicated: Secondary | ICD-10-CM | POA: Insufficient documentation

## 2015-02-16 DIAGNOSIS — R112 Nausea with vomiting, unspecified: Secondary | ICD-10-CM | POA: Diagnosis not present

## 2015-02-16 DIAGNOSIS — R0902 Hypoxemia: Secondary | ICD-10-CM

## 2015-02-16 DIAGNOSIS — Z79899 Other long term (current) drug therapy: Secondary | ICD-10-CM | POA: Insufficient documentation

## 2015-02-16 DIAGNOSIS — R42 Dizziness and giddiness: Secondary | ICD-10-CM | POA: Diagnosis not present

## 2015-02-16 DIAGNOSIS — M1990 Unspecified osteoarthritis, unspecified site: Secondary | ICD-10-CM | POA: Diagnosis not present

## 2015-02-16 DIAGNOSIS — Z85828 Personal history of other malignant neoplasm of skin: Secondary | ICD-10-CM | POA: Diagnosis not present

## 2015-02-16 DIAGNOSIS — J984 Other disorders of lung: Secondary | ICD-10-CM | POA: Diagnosis not present

## 2015-02-16 DIAGNOSIS — Z86010 Personal history of colonic polyps: Secondary | ICD-10-CM | POA: Insufficient documentation

## 2015-02-16 DIAGNOSIS — Z452 Encounter for adjustment and management of vascular access device: Secondary | ICD-10-CM | POA: Diagnosis not present

## 2015-02-16 DIAGNOSIS — R404 Transient alteration of awareness: Secondary | ICD-10-CM | POA: Diagnosis not present

## 2015-02-16 DIAGNOSIS — E785 Hyperlipidemia, unspecified: Secondary | ICD-10-CM | POA: Insufficient documentation

## 2015-02-16 DIAGNOSIS — E042 Nontoxic multinodular goiter: Secondary | ICD-10-CM | POA: Diagnosis not present

## 2015-02-16 DIAGNOSIS — R918 Other nonspecific abnormal finding of lung field: Secondary | ICD-10-CM | POA: Diagnosis not present

## 2015-02-16 HISTORY — DX: Chronic sinusitis, unspecified: J32.9

## 2015-02-16 LAB — CBC WITH DIFFERENTIAL/PLATELET
BASOS ABS: 0 10*3/uL (ref 0.0–0.1)
Basophils Relative: 0 % (ref 0–1)
Eosinophils Absolute: 0.1 10*3/uL (ref 0.0–0.7)
Eosinophils Relative: 1 % (ref 0–5)
HCT: 35.8 % — ABNORMAL LOW (ref 36.0–46.0)
Hemoglobin: 12.3 g/dL (ref 12.0–15.0)
Lymphocytes Relative: 15 % (ref 12–46)
Lymphs Abs: 1.4 10*3/uL (ref 0.7–4.0)
MCH: 31.1 pg (ref 26.0–34.0)
MCHC: 34.4 g/dL (ref 30.0–36.0)
MCV: 90.6 fL (ref 78.0–100.0)
MONO ABS: 0.3 10*3/uL (ref 0.1–1.0)
MONOS PCT: 4 % (ref 3–12)
Neutro Abs: 7 10*3/uL (ref 1.7–7.7)
Neutrophils Relative %: 80 % — ABNORMAL HIGH (ref 43–77)
PLATELETS: 240 10*3/uL (ref 150–400)
RBC: 3.95 MIL/uL (ref 3.87–5.11)
RDW: 13 % (ref 11.5–15.5)
WBC: 8.8 10*3/uL (ref 4.0–10.5)

## 2015-02-16 LAB — BLOOD GAS, ARTERIAL
ACID-BASE DEFICIT: 1.6 mmol/L (ref 0.0–2.0)
Bicarbonate: 22.6 mEq/L (ref 20.0–24.0)
Drawn by: 232811
O2 CONTENT: 2 L/min
O2 Saturation: 93.3 %
PATIENT TEMPERATURE: 97.5
PO2 ART: 71.2 mmHg — AB (ref 80.0–100.0)
TCO2: 20.5 mmol/L (ref 0–100)
pCO2 arterial: 37.3 mmHg (ref 35.0–45.0)
pH, Arterial: 7.396 (ref 7.350–7.450)

## 2015-02-16 LAB — I-STAT CHEM 8, ED
BUN: 27 mg/dL — ABNORMAL HIGH (ref 6–20)
CHLORIDE: 105 mmol/L (ref 101–111)
Calcium, Ion: 1.15 mmol/L (ref 1.13–1.30)
Creatinine, Ser: 1.1 mg/dL — ABNORMAL HIGH (ref 0.44–1.00)
GLUCOSE: 111 mg/dL — AB (ref 65–99)
HCT: 35 % — ABNORMAL LOW (ref 36.0–46.0)
Hemoglobin: 11.9 g/dL — ABNORMAL LOW (ref 12.0–15.0)
POTASSIUM: 3.2 mmol/L — AB (ref 3.5–5.1)
Sodium: 141 mmol/L (ref 135–145)
TCO2: 21 mmol/L (ref 0–100)

## 2015-02-16 LAB — I-STAT TROPONIN, ED: Troponin i, poc: 0 ng/mL (ref 0.00–0.08)

## 2015-02-16 LAB — D-DIMER, QUANTITATIVE (NOT AT ARMC)

## 2015-02-16 IMAGING — CR DG CHEST 1V PORT
1 series · 1 of 1 positions shown · non-contrast
Comparison: [DATE]

CLINICAL DATA: Back right PICC, nausea and vomiting.

EXAM:
PORTABLE CHEST - 1 VIEW

[AP]
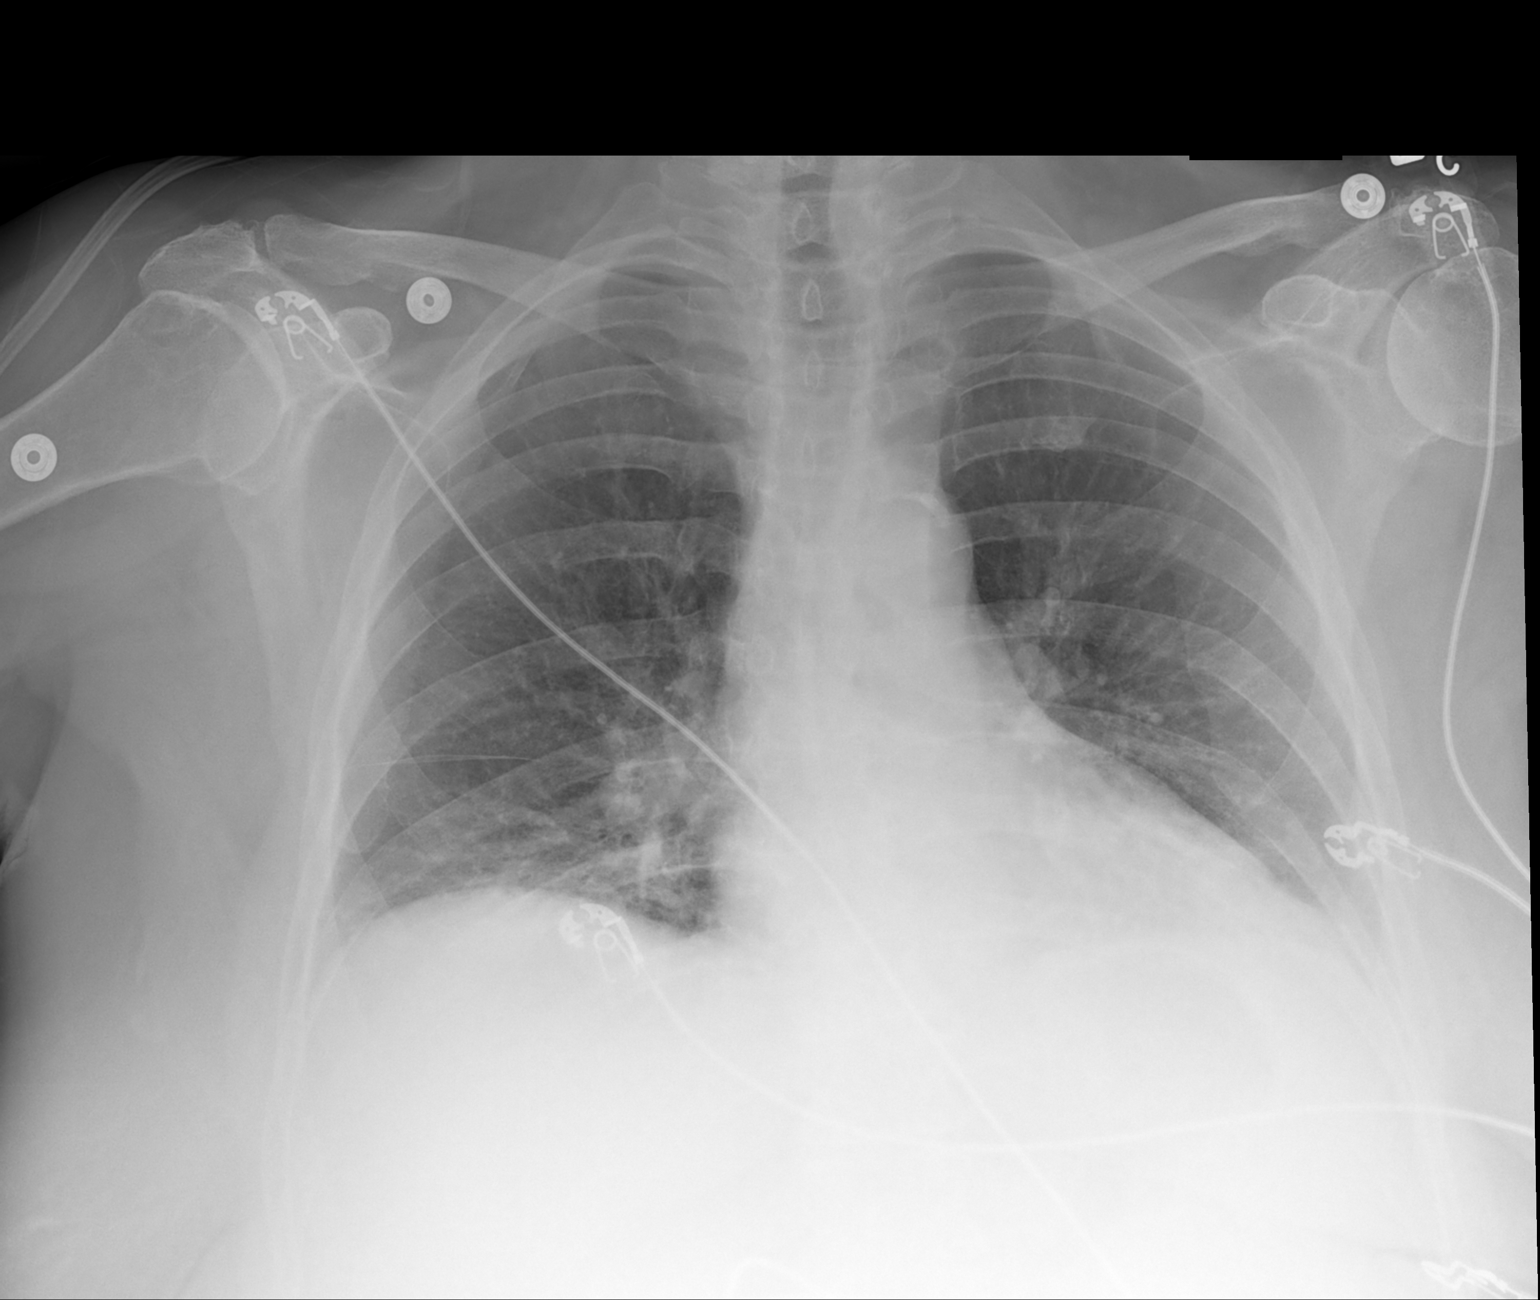

[1 of 1 positions shown; findings below may reference images not displayed]

FINDINGS: There is mild crowding of the basilar markings due to a shallow
inspiration. The lungs are clear. The pulmonary vasculature is
normal. There is no large effusion.
IMPRESSION: No acute findings.

## 2015-02-16 IMAGING — CT CT ANGIO CHEST
2 of 6 series · 17 of 46 positions shown · IV contrast (OMNIPAQUE 300)
Comparison: None.

CLINICAL DATA: Nausea and vomiting with lightheadedness. Recent
international travel.

EXAM:
CT ANGIOGRAPHY CHEST WITH CONTRAST
TECHNIQUE: Multidetector CT imaging of the chest was performed using the
standard protocol during bolus administration of intravenous
contrast. Multiplanar CT image reconstructions and MIPs were
obtained to evaluate the vascular anatomy.
CONTRAST:  100mL OMNIPAQUE IOHEXOL 350 MG/ML SOLN

[Series 6: thins for pacs · axial · 0.79mm/px · z∈[-307,-65]mm · 14 of 266 slices shown]
[im 12/266  lung]
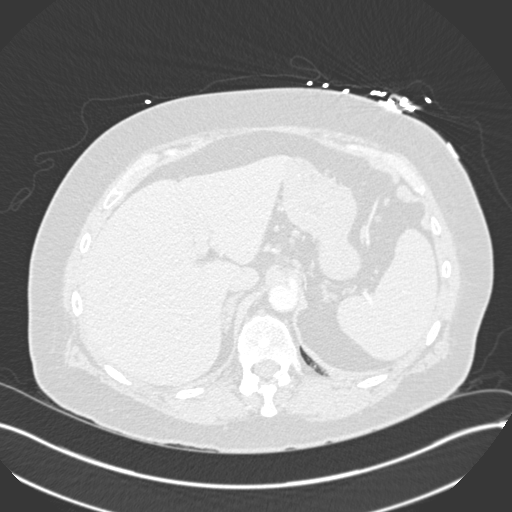
[im 35/266  soft-tissue]
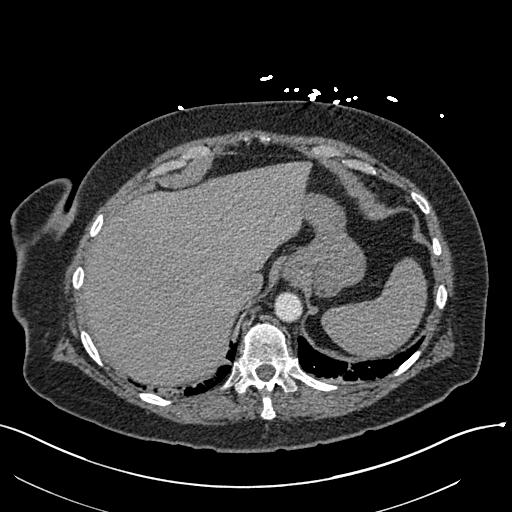
[im 47/266  lung]
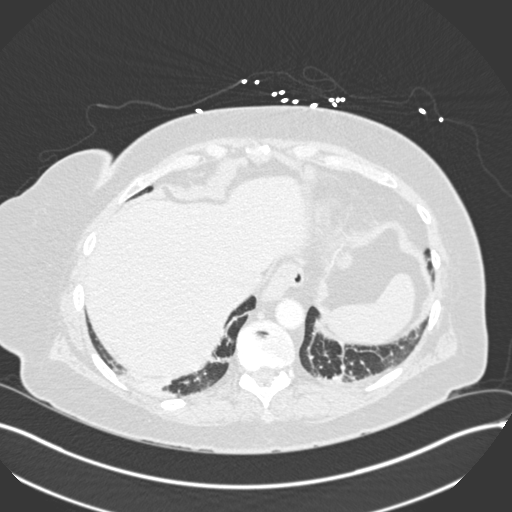
[im 70/266  soft-tissue]
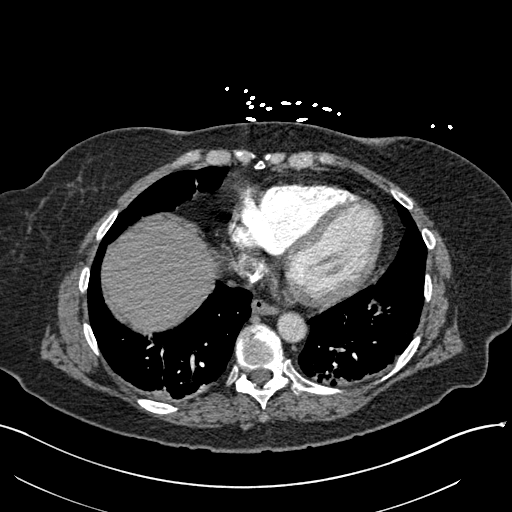
[im 93/266  lung]
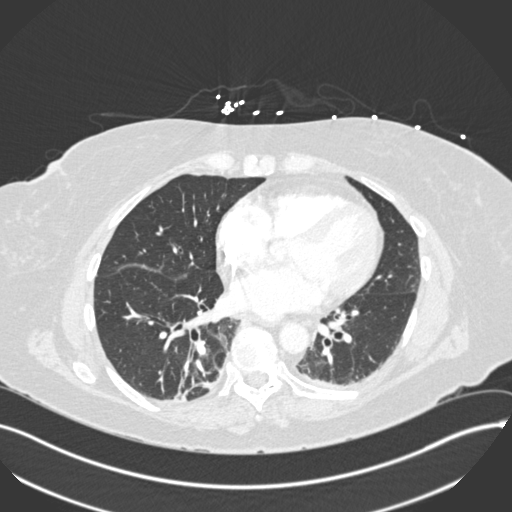
[im 104/266  soft-tissue]
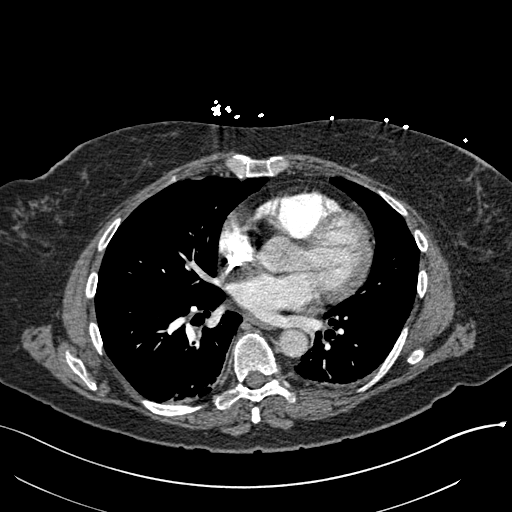
[im 127/266  lung]
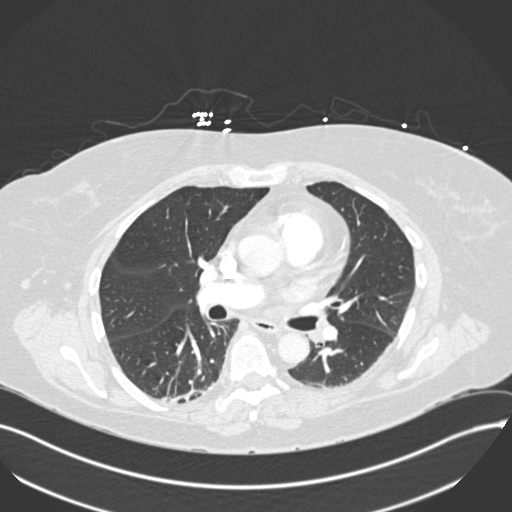
[im 139/266  soft-tissue]
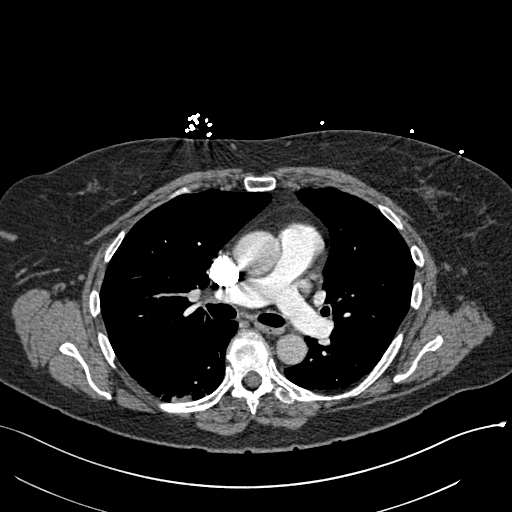
[im 162/266  lung]
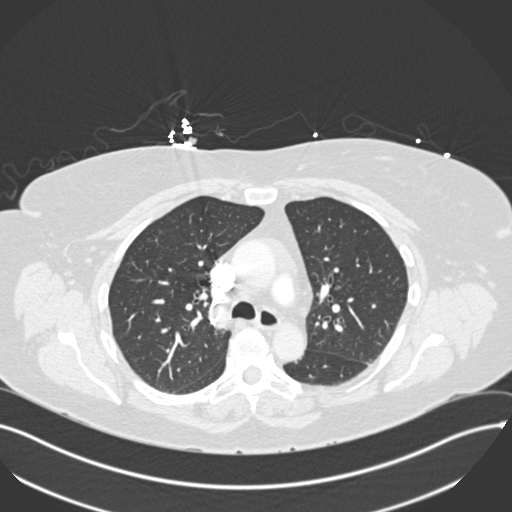
[im 173/266  soft-tissue]
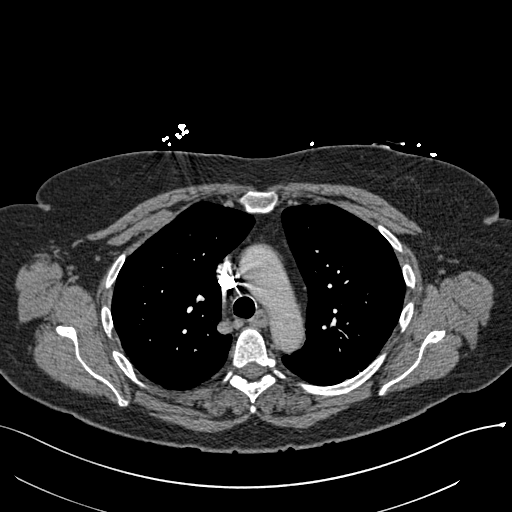
[im 196/266  lung]
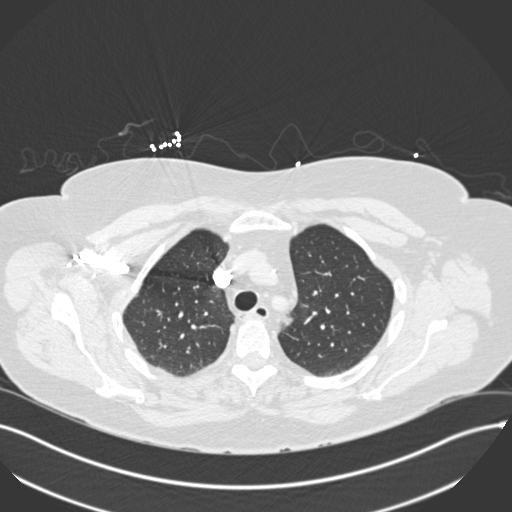
[im 219/266  soft-tissue]
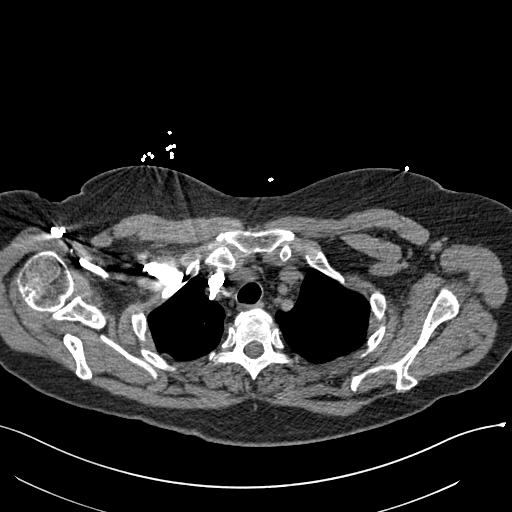
[im 231/266  lung]
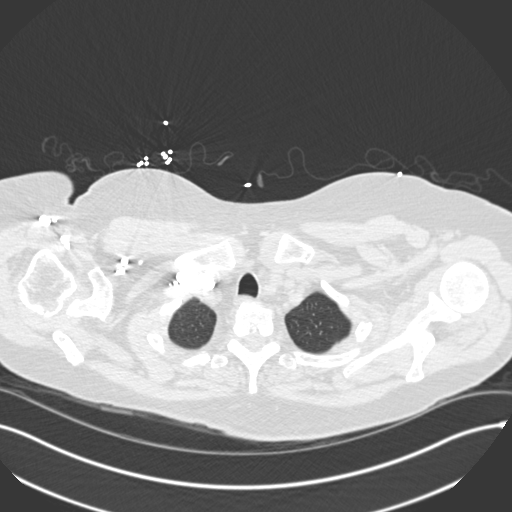
[im 254/266  soft-tissue]
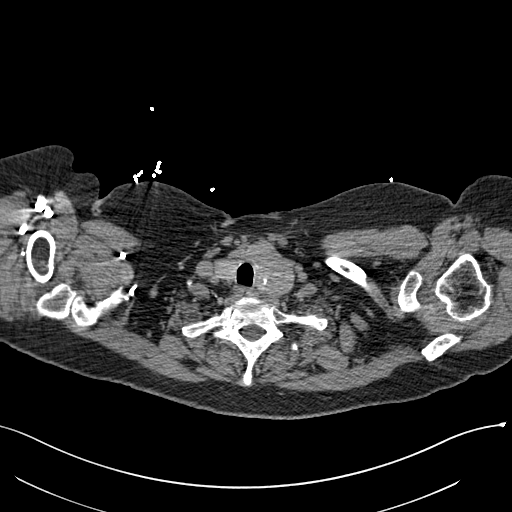

[Series 8: coronal mpr · coronal · 0.52mm/px · 3 of 124 slices shown]
[im 31/124  soft-tissue]
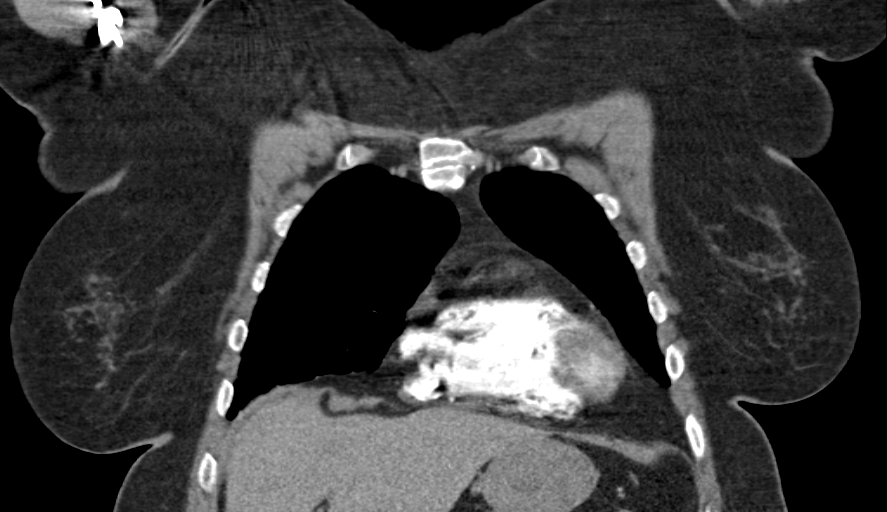
[im 62/124  soft-tissue]
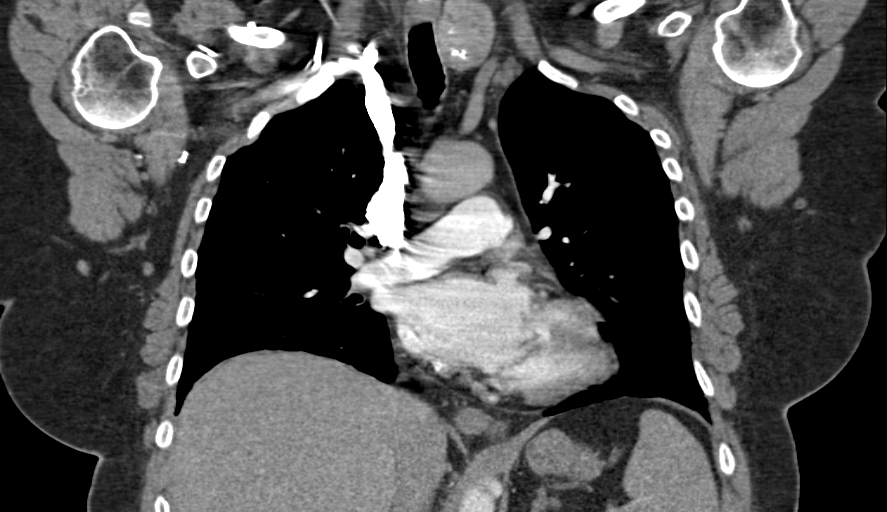
[im 93/124  soft-tissue]
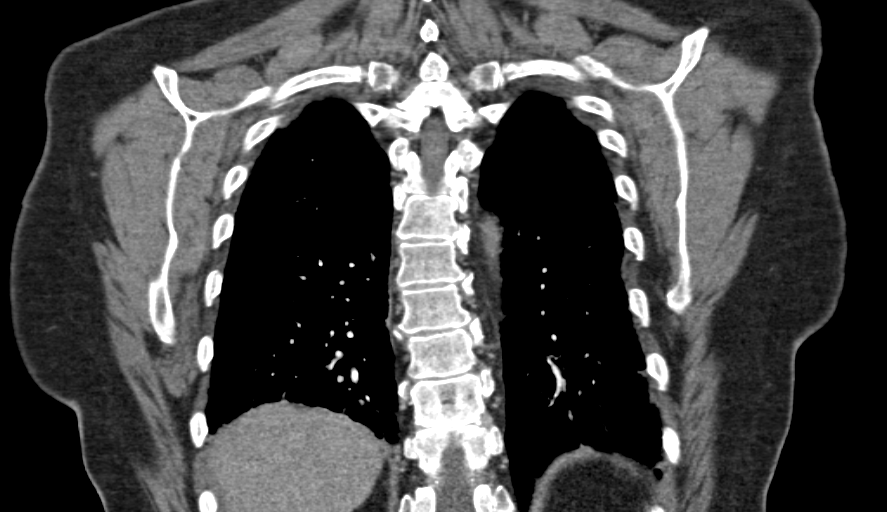

[17 of 46 positions shown; findings below may reference images not displayed]

FINDINGS: Cardiovascular: There is good opacification of the pulmonary
arteries. There is no pulmonary embolism. The thoracic aorta is
normal in caliber and intact.

Lungs: There are mild linear opacities in both lung bases. There is
no confluent airspace consolidation.

Central airways: Patent

Effusions: None

Lymphadenopathy: There are small nodes in the upper mediastinum
around the great vessel origins, measuring up to 7 mm short axis. No
pathologic adenopathy is evident.

There is enlargement of the thyroid with moderate nodularity, left
greater than right, incompletely imaged.

Esophagus: Unremarkable

Upper abdomen: Hiatal hernia

Musculoskeletal: No significant abnormality.

Review of the MIP images confirms the above findings.
IMPRESSION: 1. Negative for acute pulmonary embolism
2. Small nonspecific nodes in the upper mediastinum
3. Enlarged multi nodular thyroid, incompletely imaged.
4. Linear basilar opacities in both lungs. This could represent
early infectious infiltrate. Atelectasis or scarring is also
possible.
5. Hiatal hernia

## 2015-02-16 MED ORDER — IOHEXOL 350 MG/ML SOLN
100.0000 mL | Freq: Once | INTRAVENOUS | Status: AC | PRN
  Administered 2015-02-16: 100 mL via INTRAVENOUS

## 2015-02-16 NOTE — ED Notes (Addendum)
Pt felt "sick and dizzy". Pt was nauseated and vomited a small amount per pt. Pt unable to tell me at what time symptoms began, just that she was "going to bed" and that she usually goes to bed around midnight. Per EMS pt did take hycodan for some lingering respiratory symptoms. Medication expired in March. EMS found pt to be diaphoretic upon their arrival and did perform an EKG in route. Pt has just returned from [place]. Pt states that she was there for 2 weeks and returned 6/29

## 2015-02-16 NOTE — ED Provider Notes (Signed)
CSN: [id number]     Arrival date & time 02/16/15  0246 History   First MD Initiated Contact with Patient 02/16/15 0325     Chief Complaint  Patient presents with  . Dizzy and Sick      (Consider location/radiation/quality/duration/timing/severity/associated sxs/prior Treatment) HPI  This is a 69 year old female who returned from [place] 5 days ago. She is here after having the sudden onset of dizziness about 1 or 2 AM this morning. By dizziness she means lightheadedness in the sense that she might pass out, not the room spinning. This was accompanied by nausea, vomiting and diaphoresis. There was no chest pain or shortness of breath. Symptoms lasted about an hour and have improved significantly. She was noted to be hypoxic on room air and was started on oxygen by her nurse, though she continues to deny dyspnea.  Past Medical History  Diagnosis Date  . Diverticulosis   . Hemorrhoids   . Hyperlipidemia   . GERD (gastroesophageal reflux disease)   . Hypertension   . Colon polyps     tubular adenoma  . Hypothyroidism   . Asthma   . Depression   . Arthritis   . Shortness of breath     with exertion on occasion  . Constipation   . Skin cancer   . Sinusitis    Past Surgical History  Procedure Laterality Date  . Nasal sinus surgery    . Abdominal hysterectomy    . Cholecystectomy    . Colonoscopy  02/12/2011  . Polypectomy    . Hand surgery    . Feet surgery    . Total hip arthroplasty  12/10/2011    Procedure: TOTAL HIP ARTHROPLASTY ANTERIOR APPROACH;  Surgeon: Mcarthur Rossetti, MD;  Location: [facility];  Service: Orthopedics;  Laterality: Right;  Right Total Hip Arthroplasty, Anterior Approach   Family History  Problem Relation Age of Onset  . Ovarian cancer Mother   . Colon cancer Neg Hx    History  Substance Use Topics  . Smoking status: Former Smoker    Quit date: 08/16/2001  . Smokeless tobacco: Never Used  . Alcohol Use: Yes     Comment: rare   OB History    No data available     Review of Systems  All other systems reviewed and are negative.   Allergies  Codeine and Demerol  Home Medications   Prior to Admission medications   Medication Sig Start Date End Date Taking? Authorizing Provider  ADVAIR DISKUS 100-50 MCG/DOSE AEPB Inhale 1 puff into the lungs daily.  02/14/15  Yes Historical Provider, MD  amitriptyline (ELAVIL) 25 MG tablet Take 75 mg by mouth at bedtime.  01/22/11  Yes Historical Provider, MD  aspirin 81 MG tablet Take 81 mg by mouth daily with breakfast.    Yes Historical Provider, MD  glucosamine-chondroitin 500-400 MG tablet Take 1 tablet by mouth 2 (two) times daily.    Yes Historical Provider, MD  HYDROcodone-homatropine (HYCODAN) 5-1.5 MG/5ML syrup Take 5 mLs by mouth every 6 (six) hours as needed for cough.   Yes Historical Provider, MD  levothyroxine (SYNTHROID, LEVOTHROID) 50 MCG tablet Take 50 mcg by mouth daily with breakfast.    Yes Historical Provider, MD  losartan-hydrochlorothiazide (HYZAAR) 100-25 MG per tablet Take 1 tablet by mouth daily with breakfast.  07/02/11  Yes Historical Provider, MD  mometasone (NASONEX) 50 MCG/ACT nasal spray Place 2 sprays into the nose daily as needed. FOR ALLERGIES   Yes Historical Provider, MD  nabumetone (RELAFEN) 750 MG tablet Take 750 mg by mouth 2 (two) times daily.   Yes Historical Provider, MD  Omega-3 Fatty Acids (FISH OIL) 1200 MG CAPS Take 1 capsule by mouth 2 (two) times daily.    Yes Historical Provider, MD  omeprazole (PRILOSEC) 20 MG capsule Take 20 mg by mouth daily with breakfast.    Yes Historical Provider, MD  pravastatin (PRAVACHOL) 40 MG tablet Take 40 mg by mouth daily.  02/14/15  Yes Historical Provider, MD  verapamil (CALAN-SR) 240 MG CR tablet Take 240 mg by mouth at bedtime.    Yes Historical Provider, MD  albuterol (VENTOLIN HFA) 108 (90 BASE) MCG/ACT inhaler Inhale 2 puffs into the lungs every 6 (six) hours as needed. FOR SHORTNESS OF BREATH    Historical  Provider, MD   BP 123/59 mmHg  Pulse 72  Temp(Src) 97.4 F (36.3 C)  Resp 12  SpO2 100%   Physical Exam General: Well-developed, well-nourished female in no acute distress; appearance consistent with age of record HENT: normocephalic; atraumatic Eyes: pupils equal, round and reactive to light; extraocular muscles intact Neck: supple Heart: regular rate and rhythm; no murmur Lungs: A few basilar rales; oxygen saturation of 100% on 2 liters by nasal cannula but drops to 89% on room air Abdomen: soft; nondistended; nontender; no masses or hepatosplenomegaly; bowel sounds present Extremities: No deformity; full range of motion; pulses normal; trace edema of lower legs Neurologic: Awake, alert and oriented; motor function intact in all extremities and symmetric; no facial droop Skin: Warm and dry Psychiatric: Normal mood and affect    ED Course  Procedures (including critical care time)   MDM  Nursing notes and vitals signs, including pulse oximetry, reviewed.  Summary of this visit's results, reviewed by myself:   EKG Interpretation  Date/Time:  Sunday February 16 2015 04:28:04 EDT Ventricular Rate:  72 PR Interval:  201 QRS Duration: 110 QT Interval:  602 QTC Calculation: 659 R Axis:   0 Text Interpretation:  Sinus rhythm Abnormal R-wave progression, early transition Nonspecific T abnrm, anterolateral leads Prolonged QT interval No previous ECGs available Confirmed by Australia Droll  MD, Jenny Reichmann ([id number]) on 02/16/2015 4:36:42 AM       Labs:  Results for orders placed or performed during the hospital encounter of 02/16/15 (from the past 24 hour(s))  Blood gas, arterial ([facility] ONLY)     Status: Abnormal   Collection Time: 02/16/15  4:15 AM  Result Value Ref Range   O2 Content 2.0 L/min   Delivery systems NASAL CANNULA    pH, Arterial 7.396 7.350 - 7.450   pCO2 arterial 37.3 35.0 - 45.0 mmHg   pO2, Arterial 71.2 (L) 80.0 - 100.0 mmHg   Bicarbonate 22.6 20.0 - 24.0 mEq/L   TCO2  20.5 0 - 100 mmol/L   Acid-base deficit 1.6 0.0 - 2.0 mmol/L   O2 Saturation 93.3 %   Patient temperature 97.5    Collection site LEFT RADIAL    Drawn by [id number]    Sample type ARTERIAL    Allens test (pass/fail) PASS PASS  D-dimer, quantitative (not at [facility])     Status: None   Collection Time: 02/16/15  4:30 AM  Result Value Ref Range   D-Dimer, Quant <0.27 0.00 - 0.48 ug/mL-FEU  CBC with Differential/Platelet     Status: Abnormal   Collection Time: 02/16/15  4:30 AM  Result Value Ref Range   WBC 8.8 4.0 - 10.5 K/uL   RBC 3.95 3.87 -  5.11 MIL/uL   Hemoglobin 12.3 12.0 - 15.0 g/dL   HCT 35.8 (L) 36.0 - 46.0 %   MCV 90.6 78.0 - 100.0 fL   MCH 31.1 26.0 - 34.0 pg   MCHC 34.4 30.0 - 36.0 g/dL   RDW 13.0 11.5 - 15.5 %   Platelets 240 150 - 400 K/uL   Neutrophils Relative % 80 (H) 43 - 77 %   Neutro Abs 7.0 1.7 - 7.7 K/uL   Lymphocytes Relative 15 12 - 46 %   Lymphs Abs 1.4 0.7 - 4.0 K/uL   Monocytes Relative 4 3 - 12 %   Monocytes Absolute 0.3 0.1 - 1.0 K/uL   Eosinophils Relative 1 0 - 5 %   Eosinophils Absolute 0.1 0.0 - 0.7 K/uL   Basophils Relative 0 0 - 1 %   Basophils Absolute 0.0 0.0 - 0.1 K/uL  I-stat troponin, ED     Status: None   Collection Time: 02/16/15  4:33 AM  Result Value Ref Range   Troponin i, poc 0.00 0.00 - 0.08 ng/mL   Comment 3          I-stat chem 8, ed     Status: Abnormal   Collection Time: 02/16/15  4:34 AM  Result Value Ref Range   Sodium 141 135 - 145 mmol/L   Potassium 3.2 (L) 3.5 - 5.1 mmol/L   Chloride 105 101 - 111 mmol/L   BUN 27 (H) 6 - 20 mg/dL   Creatinine, Ser 1.10 (H) 0.44 - 1.00 mg/dL   Glucose, Bld 111 (H) 65 - 99 mg/dL   Calcium, Ion 1.15 1.13 - 1.30 mmol/L   TCO2 21 0 - 100 mmol/L   Hemoglobin 11.9 (L) 12.0 - 15.0 g/dL   HCT 35.0 (L) 36.0 - 46.0 %    Imaging Studies: Ct Angio Chest Pe W/cm &/or Wo Cm  02/16/2015   CLINICAL DATA:  Nausea and vomiting with lightheadedness. Recent international travel.  EXAM: CT ANGIOGRAPHY  CHEST WITH CONTRAST  TECHNIQUE: Multidetector CT imaging of the chest was performed using the standard protocol during bolus administration of intravenous contrast. Multiplanar CT image reconstructions and MIPs were obtained to evaluate the vascular anatomy.  CONTRAST:  168mL OMNIPAQUE IOHEXOL 350 MG/ML SOLN  COMPARISON:  None.  FINDINGS: Cardiovascular: There is good opacification of the pulmonary arteries. There is no pulmonary embolism. The thoracic aorta is normal in caliber and intact.  Lungs: There are mild linear opacities in both lung bases. There is no confluent airspace consolidation.  Central airways: Patent  Effusions: None  Lymphadenopathy: There are small nodes in the upper mediastinum around the great vessel origins, measuring up to 7 mm short axis. No pathologic adenopathy is evident.  There is enlargement of the thyroid with moderate nodularity, left greater than right, incompletely imaged.  Esophagus: Unremarkable  Upper abdomen: Hiatal hernia  Musculoskeletal: No significant abnormality.  Review of the MIP images confirms the above findings.  IMPRESSION: 1. Negative for acute pulmonary embolism 2. Small nonspecific nodes in the upper mediastinum 3. Enlarged multi nodular thyroid, incompletely imaged. 4. Linear basilar opacities in both lungs. This could represent early infectious infiltrate. Atelectasis or scarring is also possible. 5. Hiatal hernia   Electronically Signed   By: Andreas Newport M.D.   On: 02/16/2015 06:54   Dg Chest Port 1 View  02/16/2015   CLINICAL DATA:  Back right PICC, nausea and vomiting.  EXAM: PORTABLE CHEST - 1 VIEW  COMPARISON:  05/21/2007  FINDINGS:  There is mild crowding of the basilar markings due to a shallow inspiration. The lungs are clear. The pulmonary vasculature is normal. There is no large effusion.  IMPRESSION: No acute findings.   Electronically Signed   By: Andreas Newport M.D.   On: 02/16/2015 05:02   7:18 AM Patient is asymptomatic and is feeling  hungry. Her oxygen saturation is a percent on room air. It is unclear why she was desaturating earlier as she was not somnolent or dyspneic. CT is negative for PE which was a significant concern given her recent [place] flight. She states her symptoms occurred after taking amitriptyline and codeine (hydrocodone?) together and she is wondering if she had an adverse reaction to that.    Shanon Rosser, MD 02/16/15 [phone]

## 2015-02-16 NOTE — ED Notes (Signed)
Bed: WA07 Expected date: 02/16/15 Expected time: 2:31 AM Means of arrival: Ambulance Comments: 69 yo F  Dizziness, N/V

## 2015-05-19 DIAGNOSIS — I10 Essential (primary) hypertension: Secondary | ICD-10-CM | POA: Diagnosis not present

## 2015-05-19 DIAGNOSIS — R8290 Unspecified abnormal findings in urine: Secondary | ICD-10-CM | POA: Diagnosis not present

## 2015-05-19 DIAGNOSIS — E042 Nontoxic multinodular goiter: Secondary | ICD-10-CM | POA: Diagnosis not present

## 2015-05-19 DIAGNOSIS — N390 Urinary tract infection, site not specified: Secondary | ICD-10-CM | POA: Diagnosis not present

## 2015-05-19 DIAGNOSIS — E785 Hyperlipidemia, unspecified: Secondary | ICD-10-CM | POA: Diagnosis not present

## 2015-06-02 DIAGNOSIS — M1990 Unspecified osteoarthritis, unspecified site: Secondary | ICD-10-CM | POA: Diagnosis not present

## 2015-06-02 DIAGNOSIS — I10 Essential (primary) hypertension: Secondary | ICD-10-CM | POA: Diagnosis not present

## 2015-06-02 DIAGNOSIS — N183 Chronic kidney disease, stage 3 (moderate): Secondary | ICD-10-CM | POA: Diagnosis not present

## 2015-06-02 DIAGNOSIS — Z0000 Encounter for general adult medical examination without abnormal findings: Secondary | ICD-10-CM | POA: Diagnosis not present

## 2015-06-02 DIAGNOSIS — Z23 Encounter for immunization: Secondary | ICD-10-CM | POA: Diagnosis not present

## 2015-06-02 DIAGNOSIS — Z6831 Body mass index (BMI) 31.0-31.9, adult: Secondary | ICD-10-CM | POA: Diagnosis not present

## 2015-06-02 DIAGNOSIS — I129 Hypertensive chronic kidney disease with stage 1 through stage 4 chronic kidney disease, or unspecified chronic kidney disease: Secondary | ICD-10-CM | POA: Diagnosis not present

## 2015-06-02 DIAGNOSIS — J45909 Unspecified asthma, uncomplicated: Secondary | ICD-10-CM | POA: Diagnosis not present

## 2015-06-02 DIAGNOSIS — Z1389 Encounter for screening for other disorder: Secondary | ICD-10-CM | POA: Diagnosis not present

## 2015-06-02 DIAGNOSIS — E785 Hyperlipidemia, unspecified: Secondary | ICD-10-CM | POA: Diagnosis not present

## 2015-06-18 DIAGNOSIS — Z1231 Encounter for screening mammogram for malignant neoplasm of breast: Secondary | ICD-10-CM | POA: Diagnosis not present

## 2015-08-27 DIAGNOSIS — H52203 Unspecified astigmatism, bilateral: Secondary | ICD-10-CM | POA: Diagnosis not present

## 2015-08-27 DIAGNOSIS — H2513 Age-related nuclear cataract, bilateral: Secondary | ICD-10-CM | POA: Diagnosis not present

## 2015-09-09 DIAGNOSIS — H2511 Age-related nuclear cataract, right eye: Secondary | ICD-10-CM | POA: Diagnosis not present

## 2015-09-09 DIAGNOSIS — H25811 Combined forms of age-related cataract, right eye: Secondary | ICD-10-CM | POA: Diagnosis not present

## 2015-10-21 DIAGNOSIS — H2512 Age-related nuclear cataract, left eye: Secondary | ICD-10-CM | POA: Diagnosis not present

## 2015-10-21 DIAGNOSIS — H25812 Combined forms of age-related cataract, left eye: Secondary | ICD-10-CM | POA: Diagnosis not present

## 2015-12-01 DIAGNOSIS — Z1389 Encounter for screening for other disorder: Secondary | ICD-10-CM | POA: Diagnosis not present

## 2015-12-01 DIAGNOSIS — E784 Other hyperlipidemia: Secondary | ICD-10-CM | POA: Diagnosis not present

## 2015-12-01 DIAGNOSIS — Z6834 Body mass index (BMI) 34.0-34.9, adult: Secondary | ICD-10-CM | POA: Diagnosis not present

## 2015-12-01 DIAGNOSIS — J45909 Unspecified asthma, uncomplicated: Secondary | ICD-10-CM | POA: Diagnosis not present

## 2015-12-01 DIAGNOSIS — I10 Essential (primary) hypertension: Secondary | ICD-10-CM | POA: Diagnosis not present

## 2015-12-01 DIAGNOSIS — K219 Gastro-esophageal reflux disease without esophagitis: Secondary | ICD-10-CM | POA: Diagnosis not present

## 2015-12-01 DIAGNOSIS — E042 Nontoxic multinodular goiter: Secondary | ICD-10-CM | POA: Diagnosis not present

## 2015-12-01 DIAGNOSIS — I129 Hypertensive chronic kidney disease with stage 1 through stage 4 chronic kidney disease, or unspecified chronic kidney disease: Secondary | ICD-10-CM | POA: Diagnosis not present

## 2015-12-01 DIAGNOSIS — N183 Chronic kidney disease, stage 3 (moderate): Secondary | ICD-10-CM | POA: Diagnosis not present

## 2015-12-31 DIAGNOSIS — L821 Other seborrheic keratosis: Secondary | ICD-10-CM | POA: Diagnosis not present

## 2015-12-31 DIAGNOSIS — Z85828 Personal history of other malignant neoplasm of skin: Secondary | ICD-10-CM | POA: Diagnosis not present

## 2015-12-31 DIAGNOSIS — L570 Actinic keratosis: Secondary | ICD-10-CM | POA: Diagnosis not present

## 2015-12-31 DIAGNOSIS — L814 Other melanin hyperpigmentation: Secondary | ICD-10-CM | POA: Diagnosis not present

## 2015-12-31 DIAGNOSIS — D2262 Melanocytic nevi of left upper limb, including shoulder: Secondary | ICD-10-CM | POA: Diagnosis not present

## 2015-12-31 DIAGNOSIS — D1801 Hemangioma of skin and subcutaneous tissue: Secondary | ICD-10-CM | POA: Diagnosis not present

## 2015-12-31 DIAGNOSIS — D2261 Melanocytic nevi of right upper limb, including shoulder: Secondary | ICD-10-CM | POA: Diagnosis not present

## 2015-12-31 DIAGNOSIS — L565 Disseminated superficial actinic porokeratosis (DSAP): Secondary | ICD-10-CM | POA: Diagnosis not present

## 2015-12-31 DIAGNOSIS — D225 Melanocytic nevi of trunk: Secondary | ICD-10-CM | POA: Diagnosis not present

## 2016-01-26 ENCOUNTER — Encounter: Payer: Self-pay | Admitting: Internal Medicine

## 2016-02-18 DIAGNOSIS — H34812 Central retinal vein occlusion, left eye, with macular edema: Secondary | ICD-10-CM | POA: Diagnosis not present

## 2016-03-16 DIAGNOSIS — J40 Bronchitis, not specified as acute or chronic: Secondary | ICD-10-CM

## 2016-03-16 HISTORY — DX: Bronchitis, not specified as acute or chronic: J40

## 2016-04-05 DIAGNOSIS — Z6834 Body mass index (BMI) 34.0-34.9, adult: Secondary | ICD-10-CM | POA: Diagnosis not present

## 2016-04-05 DIAGNOSIS — J209 Acute bronchitis, unspecified: Secondary | ICD-10-CM | POA: Diagnosis not present

## 2016-04-05 DIAGNOSIS — J45909 Unspecified asthma, uncomplicated: Secondary | ICD-10-CM | POA: Diagnosis not present

## 2016-06-01 ENCOUNTER — Ambulatory Visit (INDEPENDENT_AMBULATORY_CARE_PROVIDER_SITE_OTHER): Payer: Medicare Other | Admitting: Orthopaedic Surgery

## 2016-06-01 DIAGNOSIS — M25512 Pain in left shoulder: Secondary | ICD-10-CM

## 2016-06-02 DIAGNOSIS — E042 Nontoxic multinodular goiter: Secondary | ICD-10-CM | POA: Diagnosis not present

## 2016-06-02 DIAGNOSIS — I10 Essential (primary) hypertension: Secondary | ICD-10-CM | POA: Diagnosis not present

## 2016-06-02 DIAGNOSIS — E784 Other hyperlipidemia: Secondary | ICD-10-CM | POA: Diagnosis not present

## 2016-06-04 DIAGNOSIS — Z1212 Encounter for screening for malignant neoplasm of rectum: Secondary | ICD-10-CM | POA: Diagnosis not present

## 2016-06-09 DIAGNOSIS — Z6836 Body mass index (BMI) 36.0-36.9, adult: Secondary | ICD-10-CM | POA: Diagnosis not present

## 2016-06-09 DIAGNOSIS — E042 Nontoxic multinodular goiter: Secondary | ICD-10-CM | POA: Diagnosis not present

## 2016-06-09 DIAGNOSIS — N183 Chronic kidney disease, stage 3 (moderate): Secondary | ICD-10-CM | POA: Diagnosis not present

## 2016-06-09 DIAGNOSIS — Z1389 Encounter for screening for other disorder: Secondary | ICD-10-CM | POA: Diagnosis not present

## 2016-06-09 DIAGNOSIS — Z23 Encounter for immunization: Secondary | ICD-10-CM | POA: Diagnosis not present

## 2016-06-09 DIAGNOSIS — E784 Other hyperlipidemia: Secondary | ICD-10-CM | POA: Diagnosis not present

## 2016-06-09 DIAGNOSIS — J45909 Unspecified asthma, uncomplicated: Secondary | ICD-10-CM | POA: Diagnosis not present

## 2016-06-09 DIAGNOSIS — I129 Hypertensive chronic kidney disease with stage 1 through stage 4 chronic kidney disease, or unspecified chronic kidney disease: Secondary | ICD-10-CM | POA: Diagnosis not present

## 2016-06-09 DIAGNOSIS — M1990 Unspecified osteoarthritis, unspecified site: Secondary | ICD-10-CM | POA: Diagnosis not present

## 2016-06-09 DIAGNOSIS — I10 Essential (primary) hypertension: Secondary | ICD-10-CM | POA: Diagnosis not present

## 2016-06-09 DIAGNOSIS — Z0000 Encounter for general adult medical examination without abnormal findings: Secondary | ICD-10-CM | POA: Diagnosis not present

## 2016-06-14 ENCOUNTER — Encounter: Payer: Self-pay | Admitting: Internal Medicine

## 2016-06-15 ENCOUNTER — Ambulatory Visit (INDEPENDENT_AMBULATORY_CARE_PROVIDER_SITE_OTHER): Payer: Medicare Other | Admitting: Orthopaedic Surgery

## 2016-06-30 DIAGNOSIS — Z803 Family history of malignant neoplasm of breast: Secondary | ICD-10-CM | POA: Diagnosis not present

## 2016-06-30 DIAGNOSIS — Z1231 Encounter for screening mammogram for malignant neoplasm of breast: Secondary | ICD-10-CM | POA: Diagnosis not present

## 2016-08-02 ENCOUNTER — Ambulatory Visit (AMBULATORY_SURGERY_CENTER): Payer: Self-pay | Admitting: *Deleted

## 2016-08-02 VITALS — Ht 64.0 in | Wt 213.0 lb

## 2016-08-02 DIAGNOSIS — Z86010 Personal history of colonic polyps: Secondary | ICD-10-CM

## 2016-08-02 MED ORDER — NA SULFATE-K SULFATE-MG SULF 17.5-3.13-1.6 GM/177ML PO SOLN: ORAL | 0 refills | Status: DC

## 2016-08-02 NOTE — Progress Notes (Signed)
Patient denies any allergies to eggs or soy. Patient denies any problems with anesthesia/sedation. Patient denies any oxygen use at home and does not take any diet/weight loss medications. Patient declined EMMI education video.

## 2016-08-19 ENCOUNTER — Encounter: Payer: Self-pay | Admitting: Internal Medicine

## 2016-08-19 ENCOUNTER — Ambulatory Visit (AMBULATORY_SURGERY_CENTER): Payer: Medicare Other | Admitting: Internal Medicine

## 2016-08-19 VITALS — BP 125/69 | HR 77 | Temp 95.3°F | Resp 12 | Ht 65.0 in | Wt 195.0 lb

## 2016-08-19 DIAGNOSIS — D123 Benign neoplasm of transverse colon: Secondary | ICD-10-CM | POA: Diagnosis not present

## 2016-08-19 DIAGNOSIS — Z886 Allergy status to analgesic agent status: Secondary | ICD-10-CM | POA: Diagnosis not present

## 2016-08-19 DIAGNOSIS — Z86010 Personal history of colonic polyps: Secondary | ICD-10-CM

## 2016-08-19 DIAGNOSIS — K219 Gastro-esophageal reflux disease without esophagitis: Secondary | ICD-10-CM | POA: Diagnosis not present

## 2016-08-19 DIAGNOSIS — J45909 Unspecified asthma, uncomplicated: Secondary | ICD-10-CM | POA: Diagnosis not present

## 2016-08-19 DIAGNOSIS — F329 Major depressive disorder, single episode, unspecified: Secondary | ICD-10-CM | POA: Diagnosis not present

## 2016-08-19 DIAGNOSIS — I10 Essential (primary) hypertension: Secondary | ICD-10-CM | POA: Diagnosis not present

## 2016-08-19 MED ORDER — SODIUM CHLORIDE 0.9 % IV SOLN: 500.0000 mL | INTRAVENOUS | Status: DC

## 2016-08-19 NOTE — Progress Notes (Signed)
Report to PACU, RN, vss, BBS= Clear.  

## 2016-08-19 NOTE — Op Note (Signed)
[facility] Patient Name: Cassidy Garcia Procedure Date: 08/19/2016 11:44 AM MRN: [id number] Endoscopist: Docia Chuck. Henrene Pastor , MD Age: 71 Referring MD:  Date of Birth: 1946-05-11 Gender: Female Account #: [id number] Procedure:                Colonoscopy, with cold snare polypectomy x 1 Indications:              High risk colon cancer surveillance: Personal                            history of non-advanced adenoma. Index exam 2004                            negative. Follow-up exam 2009 with small tubular                            adenoma. Last examination 2012 to evaluate                            Hemoccult-positive stool was negative Medicines:                Monitored Anesthesia Care Procedure:                Pre-Anesthesia Assessment:                           - Prior to the procedure, a History and Physical                            was performed, and patient medications and                            allergies were reviewed. The patient's tolerance of                            previous anesthesia was also reviewed. The risks                            and benefits of the procedure and the sedation                            options and risks were discussed with the patient.                            All questions were answered, and informed consent                            was obtained. Prior Anticoagulants: The patient has                            taken no previous anticoagulant or antiplatelet                            agents. ASA Grade Assessment: II - A patient with  mild systemic disease. After reviewing the risks                            and benefits, the patient was deemed in                            satisfactory condition to undergo the procedure.                           After obtaining informed consent, the colonoscope                            was passed under direct vision. Throughout the   procedure, the patient's blood pressure, pulse, and                            oxygen saturations were monitored continuously. The                            Model CF-HQ190L [id number]) scope was introduced                            through the anus and advanced to the the cecum,                            identified by appendiceal orifice and ileocecal                            valve. The ileocecal valve, appendiceal orifice,                            and rectum were photographed. The quality of the                            bowel preparation was excellent. The colonoscopy                            was performed without difficulty. The patient                            tolerated the procedure well. The bowel preparation                            used was SUPREP. Scope In: 11:49:40 AM Scope Out: 12:04:21 PM Scope Withdrawal Time: 0 hours 11 minutes 17 seconds  Total Procedure Duration: 0 hours 14 minutes 41 seconds  Findings:                 A 1 mm polyp was found in the proximal transverse                            colon. The polyp was removed with a cold snare.  Resection was complete. No meaningful tissue                            available for pathologic submission.                           The exam was otherwise without abnormality on                            direct and retroflexion views. Complications:            No immediate complications. Estimated blood loss:                            None. Estimated Blood Loss:     Estimated blood loss: none. Impression:               - One 1 mm polyp in the proximal transverse colon,                            removed with a cold snare. Resected.                           - The examination was otherwise normal on direct                            and retroflexion views. Recommendation:           - Repeat colonoscopy is not recommended for                            surveillance, given the favorable  findings and                            current age.                           - Patient has a contact number available for                            emergencies. The signs and symptoms of potential                            delayed complications were discussed with the                            patient. Return to normal activities tomorrow.                            Written discharge instructions were provided to the                            patient.                           - Resume previous diet.                           -  Continue present medications. Docia Chuck. Henrene Pastor, MD 08/19/2016 12:10:48 PM This report has been signed electronically.

## 2016-08-19 NOTE — Progress Notes (Signed)
Called to room to assist during endoscopic procedure.  Patient ID and intended procedure confirmed with present staff. Received instructions for my participation in the procedure from the performing physician.  

## 2016-08-19 NOTE — Patient Instructions (Signed)
YOU HAD AN ENDOSCOPIC PROCEDURE TODAY AT THE [facility]:   Refer to the procedure report that was given to you for any specific questions about what was found during the examination.  If the procedure report does not answer your questions, please call your gastroenterologist to clarify.  If you requested that your care partner not be given the details of your procedure findings, then the procedure report has been included in a sealed envelope for you to review at your convenience later.  YOU SHOULD EXPECT: Some feelings of bloating in the abdomen. Passage of more gas than usual.  Walking can help get rid of the air that was put into your GI tract during the procedure and reduce the bloating. If you had a lower endoscopy (such as a colonoscopy or flexible sigmoidoscopy) you may notice spotting of blood in your stool or on the toilet paper. If you underwent a bowel prep for your procedure, you may not have a normal bowel movement for a few days.  Please Note:  You might notice some irritation and congestion in your nose or some drainage.  This is from the oxygen used during your procedure.  There is no need for concern and it should clear up in a day or so.  SYMPTOMS TO REPORT IMMEDIATELY:   Following lower endoscopy (colonoscopy or flexible sigmoidoscopy):  Excessive amounts of blood in the stool  Significant tenderness or worsening of abdominal pains  Swelling of the abdomen that is new, acute  Fever of 100F or higher    For urgent or emergent issues, a gastroenterologist can be reached at any hour by calling [phone].   DIET:  We do recommend a small meal at first, but then you may proceed to your regular diet.  Drink plenty of fluids but you should avoid alcoholic beverages for 24 hours.  ACTIVITY:  You should plan to take it easy for the rest of today and you should NOT DRIVE or use heavy machinery until tomorrow (because of the sedation medicines used during the test).     FOLLOW UP: Our staff will call the number listed on your records the next business day following your procedure to check on you and address any questions or concerns that you may have regarding the information given to you following your procedure. If we do not reach you, we will leave a message.  However, if you are feeling well and you are not experiencing any problems, there is no need to return our call.  We will assume that you have returned to your regular daily activities without incident.  If any biopsies were taken you will be contacted by phone or by letter within the next 1-3 weeks.  Please call us at [phone] if you have not heard about the biopsies in 3 weeks.    SIGNATURES/CONFIDENTIALITY: You and/or your care partner have signed paperwork which will be entered into your electronic medical record.  These signatures attest to the fact that that the information above on your After Visit Summary has been reviewed and is understood.  Full responsibility of the confidentiality of this discharge information lies with you and/or your care-partner.   Resume medications. Information given on polyps. 

## 2016-08-20 ENCOUNTER — Telehealth: Payer: Self-pay

## 2016-08-20 NOTE — Telephone Encounter (Signed)
  Follow up Call-  Call back number 08/19/2016  Post procedure Call Back phone  # [phone]  Permission to leave phone message Yes  Some recent data might be hidden     Patient questions:  Do you have a fever, pain , or abdominal swelling? No. Pain Score  0 *  Have you tolerated food without any problems? Yes.    Have you been able to return to your normal activities? Yes.    Do you have any questions about your discharge instructions: Diet   No. Medications  No. Follow up visit  No.  Do you have questions or concerns about your Care? No.  Actions: * If pain score is 4 or above: No action needed, pain <4.

## 2016-11-26 DIAGNOSIS — M79672 Pain in left foot: Secondary | ICD-10-CM | POA: Diagnosis not present

## 2016-12-02 ENCOUNTER — Ambulatory Visit (INDEPENDENT_AMBULATORY_CARE_PROVIDER_SITE_OTHER): Payer: Medicare Other

## 2016-12-02 ENCOUNTER — Ambulatory Visit (INDEPENDENT_AMBULATORY_CARE_PROVIDER_SITE_OTHER): Payer: Medicare Other | Admitting: Orthopaedic Surgery

## 2016-12-02 ENCOUNTER — Encounter (INDEPENDENT_AMBULATORY_CARE_PROVIDER_SITE_OTHER): Payer: Self-pay | Admitting: Orthopaedic Surgery

## 2016-12-02 DIAGNOSIS — G8929 Other chronic pain: Secondary | ICD-10-CM

## 2016-12-02 DIAGNOSIS — M79675 Pain in left toe(s): Secondary | ICD-10-CM | POA: Diagnosis not present

## 2016-12-02 DIAGNOSIS — M25511 Pain in right shoulder: Secondary | ICD-10-CM | POA: Diagnosis not present

## 2016-12-02 MED ORDER — DICLOFENAC SODIUM 75 MG PO TBEC: 75.0000 mg | DELAYED_RELEASE_TABLET | Freq: Two times a day (BID) | ORAL | 2 refills | Status: DC

## 2016-12-04 DIAGNOSIS — G8929 Other chronic pain: Secondary | ICD-10-CM | POA: Insufficient documentation

## 2016-12-04 DIAGNOSIS — M79675 Pain in left toe(s): Secondary | ICD-10-CM | POA: Insufficient documentation

## 2016-12-04 DIAGNOSIS — M25511 Pain in right shoulder: Secondary | ICD-10-CM

## 2016-12-04 NOTE — Progress Notes (Signed)
Office Visit Note   Patient: Cassidy Garcia           Date of Birth: July 04, 1946           MRN: [id number] Visit Date: 12/02/2016              Requested by: Burnard Bunting, MD [place], [place] [id number] PCP: Geoffery Lyons, MD   Assessment & Plan: Visit Diagnoses:  1. Great toe pain, left   2. Chronic right shoulder pain     Plan: X-ray show severe hallux rigidus. I did recommend trying diclofenac for this. Prescription for a carbon footplate with a Morton's extension was given to [employer]. For the shoulder continue with home exercises and diclofenac as needed. Questions encouraged and answered. Follow-up with me as needed.  Follow-Up Instructions: Return if symptoms worsen or fail to improve.   Orders:  Orders Placed This Encounter  Procedures  . XR Toe Great Left  . XR Shoulder Right   Meds ordered this encounter  Medications  . diclofenac (VOLTAREN) 75 MG EC tablet    Sig: Take 1 tablet (75 mg total) by mouth 2 (two) times daily.    Dispense:  30 tablet    Refill:  2      Procedures: No procedures performed   Clinical Data: No additional findings.   Subjective: Chief Complaint  Patient presents with  . Left Great Toe - Injury, Pain  . Right Shoulder - Pain    Patient is a 71 year old female who comes in with 2 complaints. First one is left great toe pain. She has pain when extending her MCP joint. She has sharp shooting pain. She has minimal range of motion.  She fell last week and injured her toe. She mainly wants an x-ray to make sure that this is doing okay. In terms of her right shoulder is also worse from the fall. She has an aching pain. She has done physical therapy for it before.    Review of Systems  Constitutional: Negative.   HENT: Negative.   Eyes: Negative.   Respiratory: Negative.   Cardiovascular: Negative.   Endocrine: Negative.   Musculoskeletal: Negative.   Neurological: Negative.   Hematological: Negative.     Psychiatric/Behavioral: Negative.   All other systems reviewed and are negative.    Objective: Vital Signs: There were no vitals taken for this visit.  Physical Exam  Constitutional: She is oriented to person, place, and time. She appears well-developed and well-nourished.  HENT:  Head: Normocephalic and atraumatic.  Eyes: EOM are normal.  Neck: Neck supple.  Pulmonary/Chest: Effort normal.  Abdominal: Soft.  Neurological: She is alert and oriented to person, place, and time.  Skin: Skin is warm. Capillary refill takes less than 2 seconds.  Psychiatric: She has a normal mood and affect. Her behavior is normal. Judgment and thought content normal.  Nursing note and vitals reviewed.   Ortho Exam Left great toe shows minimal range of motion with positive groin sign. Pain with dorsiflexion. Right shoulder overall is a benign exam. Specialty Comments:  No specialty comments available.  Imaging: No results found.   PMFS History: Patient Active Problem List   Diagnosis Date Noted  . Great toe pain, left 12/04/2016  . Chronic right shoulder pain 12/04/2016  . Degenerative arthritis of hip 12/10/2011  . Chest pain 07/06/2011  . HTN (hypertension) 07/06/2011  . Hyperlipidemia 07/06/2011   Past Medical History:  Diagnosis Date  . Arthritis   . Asthma   .  Bronchitis 03/2016  . Chronic kidney disease    stage 3  . Colon polyps    tubular adenoma  . Constipation   . Depression   . Diverticulosis   . GERD (gastroesophageal reflux disease)   . Hemorrhoids   . Hyperlipidemia   . Hypertension   . Hypothyroidism   . Shortness of breath    with exertion on occasion  . Sinusitis   . Skin cancer     Family History  Problem Relation Age of Onset  . Ovarian cancer Mother   . Colon cancer Neg Hx     Past Surgical History:  Procedure Laterality Date  . ABDOMINAL HYSTERECTOMY    . CHOLECYSTECTOMY    . COLONOSCOPY  02/12/2011  . FEET SURGERY    . HAND SURGERY    .  NASAL SINUS SURGERY    . POLYPECTOMY    . TOTAL HIP ARTHROPLASTY  12/10/2011   Procedure: TOTAL HIP ARTHROPLASTY ANTERIOR APPROACH;  Surgeon: Mcarthur Rossetti, MD;  Location: [facility];  Service: Orthopedics;  Laterality: Right;  Right Total Hip Arthroplasty, Anterior Approach   Social History   Occupational History  .  Retired   Social History Main Topics  . Smoking status: Former Smoker    Quit date: 08/16/2001  . Smokeless tobacco: Never Used  . Alcohol use No     Comment: rare  . Drug use: No  . Sexual activity: Not on file

## 2017-01-03 DIAGNOSIS — L821 Other seborrheic keratosis: Secondary | ICD-10-CM | POA: Diagnosis not present

## 2017-01-03 DIAGNOSIS — L720 Epidermal cyst: Secondary | ICD-10-CM | POA: Diagnosis not present

## 2017-01-03 DIAGNOSIS — D1801 Hemangioma of skin and subcutaneous tissue: Secondary | ICD-10-CM | POA: Diagnosis not present

## 2017-01-03 DIAGNOSIS — D485 Neoplasm of uncertain behavior of skin: Secondary | ICD-10-CM | POA: Diagnosis not present

## 2017-01-03 DIAGNOSIS — Z85828 Personal history of other malignant neoplasm of skin: Secondary | ICD-10-CM | POA: Diagnosis not present

## 2017-01-03 DIAGNOSIS — B078 Other viral warts: Secondary | ICD-10-CM | POA: Diagnosis not present

## 2017-02-17 DIAGNOSIS — H52203 Unspecified astigmatism, bilateral: Secondary | ICD-10-CM | POA: Diagnosis not present

## 2017-02-17 DIAGNOSIS — H524 Presbyopia: Secondary | ICD-10-CM | POA: Diagnosis not present

## 2017-02-17 DIAGNOSIS — Z961 Presence of intraocular lens: Secondary | ICD-10-CM | POA: Diagnosis not present

## 2017-03-01 DIAGNOSIS — Z6836 Body mass index (BMI) 36.0-36.9, adult: Secondary | ICD-10-CM | POA: Diagnosis not present

## 2017-03-01 DIAGNOSIS — Z1389 Encounter for screening for other disorder: Secondary | ICD-10-CM | POA: Diagnosis not present

## 2017-03-01 DIAGNOSIS — M1990 Unspecified osteoarthritis, unspecified site: Secondary | ICD-10-CM | POA: Diagnosis not present

## 2017-03-01 DIAGNOSIS — N183 Chronic kidney disease, stage 3 (moderate): Secondary | ICD-10-CM | POA: Diagnosis not present

## 2017-03-01 DIAGNOSIS — K649 Unspecified hemorrhoids: Secondary | ICD-10-CM | POA: Insufficient documentation

## 2017-03-01 DIAGNOSIS — E784 Other hyperlipidemia: Secondary | ICD-10-CM | POA: Diagnosis not present

## 2017-03-01 DIAGNOSIS — I129 Hypertensive chronic kidney disease with stage 1 through stage 4 chronic kidney disease, or unspecified chronic kidney disease: Secondary | ICD-10-CM | POA: Diagnosis not present

## 2017-03-01 DIAGNOSIS — I10 Essential (primary) hypertension: Secondary | ICD-10-CM | POA: Diagnosis not present

## 2017-03-01 DIAGNOSIS — J45909 Unspecified asthma, uncomplicated: Secondary | ICD-10-CM | POA: Diagnosis not present

## 2017-03-01 DIAGNOSIS — E042 Nontoxic multinodular goiter: Secondary | ICD-10-CM | POA: Diagnosis not present

## 2017-04-20 DIAGNOSIS — Z23 Encounter for immunization: Secondary | ICD-10-CM | POA: Diagnosis not present

## 2017-05-10 DIAGNOSIS — Z6836 Body mass index (BMI) 36.0-36.9, adult: Secondary | ICD-10-CM | POA: Diagnosis not present

## 2017-05-10 DIAGNOSIS — R05 Cough: Secondary | ICD-10-CM | POA: Diagnosis not present

## 2017-05-10 DIAGNOSIS — J019 Acute sinusitis, unspecified: Secondary | ICD-10-CM | POA: Diagnosis not present

## 2017-05-10 DIAGNOSIS — J029 Acute pharyngitis, unspecified: Secondary | ICD-10-CM | POA: Diagnosis not present

## 2017-06-10 DIAGNOSIS — R82998 Other abnormal findings in urine: Secondary | ICD-10-CM | POA: Diagnosis not present

## 2017-06-10 DIAGNOSIS — E042 Nontoxic multinodular goiter: Secondary | ICD-10-CM | POA: Diagnosis not present

## 2017-06-10 DIAGNOSIS — I10 Essential (primary) hypertension: Secondary | ICD-10-CM | POA: Diagnosis not present

## 2017-06-16 DIAGNOSIS — I10 Essential (primary) hypertension: Secondary | ICD-10-CM | POA: Diagnosis not present

## 2017-06-16 DIAGNOSIS — J45909 Unspecified asthma, uncomplicated: Secondary | ICD-10-CM | POA: Diagnosis not present

## 2017-06-16 DIAGNOSIS — Z0000 Encounter for general adult medical examination without abnormal findings: Secondary | ICD-10-CM | POA: Diagnosis not present

## 2017-06-16 DIAGNOSIS — K649 Unspecified hemorrhoids: Secondary | ICD-10-CM | POA: Diagnosis not present

## 2017-06-16 DIAGNOSIS — I129 Hypertensive chronic kidney disease with stage 1 through stage 4 chronic kidney disease, or unspecified chronic kidney disease: Secondary | ICD-10-CM | POA: Diagnosis not present

## 2017-06-16 DIAGNOSIS — Z6836 Body mass index (BMI) 36.0-36.9, adult: Secondary | ICD-10-CM | POA: Diagnosis not present

## 2017-06-16 DIAGNOSIS — N183 Chronic kidney disease, stage 3 (moderate): Secondary | ICD-10-CM | POA: Diagnosis not present

## 2017-06-16 DIAGNOSIS — E7849 Other hyperlipidemia: Secondary | ICD-10-CM | POA: Diagnosis not present

## 2017-06-16 DIAGNOSIS — E042 Nontoxic multinodular goiter: Secondary | ICD-10-CM | POA: Diagnosis not present

## 2017-06-16 DIAGNOSIS — M1990 Unspecified osteoarthritis, unspecified site: Secondary | ICD-10-CM | POA: Diagnosis not present

## 2017-06-16 DIAGNOSIS — K219 Gastro-esophageal reflux disease without esophagitis: Secondary | ICD-10-CM | POA: Diagnosis not present

## 2017-06-17 DIAGNOSIS — Z1212 Encounter for screening for malignant neoplasm of rectum: Secondary | ICD-10-CM | POA: Diagnosis not present

## 2017-06-29 ENCOUNTER — Ambulatory Visit (INDEPENDENT_AMBULATORY_CARE_PROVIDER_SITE_OTHER): Payer: Medicare Other

## 2017-06-29 ENCOUNTER — Encounter (INDEPENDENT_AMBULATORY_CARE_PROVIDER_SITE_OTHER): Payer: Self-pay | Admitting: Orthopaedic Surgery

## 2017-06-29 ENCOUNTER — Ambulatory Visit (INDEPENDENT_AMBULATORY_CARE_PROVIDER_SITE_OTHER): Payer: Medicare Other | Admitting: Orthopaedic Surgery

## 2017-06-29 DIAGNOSIS — M7061 Trochanteric bursitis, right hip: Secondary | ICD-10-CM | POA: Diagnosis not present

## 2017-06-29 DIAGNOSIS — Z96641 Presence of right artificial hip joint: Secondary | ICD-10-CM

## 2017-06-29 DIAGNOSIS — M25551 Pain in right hip: Secondary | ICD-10-CM

## 2017-06-29 MED ORDER — METHYLPREDNISOLONE ACETATE 40 MG/ML IJ SUSP
40.0000 mg | INTRAMUSCULAR | Status: AC | PRN
  Administered 2017-06-29: 40 mg via INTRA_ARTICULAR

## 2017-06-29 MED ORDER — LIDOCAINE HCL 1 % IJ SOLN
3.0000 mL | INTRAMUSCULAR | Status: AC | PRN
  Administered 2017-06-29: 3 mL

## 2017-06-29 NOTE — Progress Notes (Signed)
Office Visit Note   Patient: Cassidy Garcia           Date of Birth: 06-12-1946           MRN: [id number] Visit Date: 06/29/2017              Requested by: Burnard Bunting, MD [address] [place], [place] [address] PCP: Burnard Bunting, MD   Assessment & Plan: Visit Diagnoses:  1. Pain in right hip   2. Trochanteric bursitis, right hip   3. History of total replacement of right hip     Plan: Her pain seems to be more trochanteric bursitis related and it does problems with her hip replacement.  This seemed to flareup after new stretching exercises before water aerobics.  I talked her about trying a steroid injection of the trochanteric.  I showed her stretching to try as well.  She agreed to trial this.  We will see her back in a month to make sure things have calmed down for her.  I gave her reassurance of this for hip replacement itself looks good.  Follow-Up Instructions: Return in about 4 weeks (around 07/27/2017).   Orders:  Orders Placed This Encounter  Procedures  . Large Joint Inj  . XR HIP UNILAT W OR W/O PELVIS 1V RIGHT   No orders of the defined types were placed in this encounter.     Procedures: Large Joint Inj: R greater trochanter on 06/29/2017 4:43 PM Indications: pain and diagnostic evaluation Details: 22 G 1.5 in needle, lateral approach  Arthrogram: No  Medications: 3 mL lidocaine 1 %; 40 mg methylPREDNISolone acetate 40 MG/ML Outcome: tolerated well, no immediate complications Procedure, treatment alternatives, risks and benefits explained, specific risks discussed. Consent was given by the patient. Immediately prior to procedure a time out was called to verify the correct patient, procedure, equipment, support staff and site/side marked as required. Patient was prepped and draped in the usual sterile fashion.       Clinical Data: No additional findings.   Subjective: Chief Complaint  Patient presents with  . Right Hip - Pain   The  patient is someone who I performed a total hip arthroplasty on the right hip 6 years ago.  She is been having some pain only for about 3 weeks ago in that right hip but is over the trochanteric area where she points.  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  She says actually feeling a little bit better but is still painful when she lays flat on her back at night and not on that hip.  She denies any significant groin pain.  Otherwise before that she has had no problems with her hip at all.  She denies any back pain.  She denies any numbness and tingling in her foot. HPI  Review of Systems She currently denies any headache, chest pain, shortness of breath, fever, chills, nausea, vomiting.  Objective: Vital Signs: There were no vitals taken for this visit.  Physical Exam She is alert and oriented x3 and in no acute distress Ortho Exam Examination of her right hip shows fluid internal/external rotation with no pain in the groin.  Her pain is only over the trochanteric area and radiates into the iliotibial band. Specialty Comments:  No specialty comments available.  Imaging: Xr Hip Unilat W Or  W/o Pelvis 1v Right  Result Date: 06/29/2017 An AP pelvis and a lateral of her right hip shows a well-seated total hip arthroplasty with no evidence of loosening or complicating features.    PMFS History: Patient Active Problem List   Diagnosis Date Noted  . Pain in right hip 06/29/2017  . Trochanteric bursitis, right hip 06/29/2017  . 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 Great toe pain, left 12/04/2016  . Chronic right shoulder pain 12/04/2016  . Degenerative arthritis of hip 12/10/2011  . Chest pain 07/06/2011  . HTN (hypertension) 07/06/2011  . Hyperlipidemia 07/06/2011   Past Medical History:  Diagnosis Date  . Arthritis   . Asthma   .  Bronchitis 03/2016  . Chronic kidney disease    stage 3  . Colon polyps    tubular adenoma  . Constipation   . Depression   . Diverticulosis   . GERD (gastroesophageal reflux disease)   . Hemorrhoids   . Hyperlipidemia   . Hypertension   . Hypothyroidism   . Shortness of breath    with exertion on occasion  . Sinusitis   . Skin cancer     Family History  Problem Relation Age of Onset  . Ovarian cancer Mother   . Colon cancer Neg Hx     Past Surgical History:  Procedure Laterality Date  . ABDOMINAL HYSTERECTOMY    . CHOLECYSTECTOMY    . COLONOSCOPY  02/12/2011  . FEET SURGERY    . HAND SURGERY    . NASAL SINUS SURGERY    . POLYPECTOMY     Social History   Occupational History    Employer: RETIRED  Tobacco Use  . Smoking status: Former Smoker    Last attempt to quit: 08/16/2001    Years since quitting: 15.8  . Smokeless tobacco: Never Used  Substance and Sexual Activity  . Alcohol use: No    Comment: rare  . Drug use: No  . Sexual activity: Not on file

## 2017-07-13 DIAGNOSIS — Z1231 Encounter for screening mammogram for malignant neoplasm of breast: Secondary | ICD-10-CM | POA: Diagnosis not present

## 2017-07-28 ENCOUNTER — Ambulatory Visit (INDEPENDENT_AMBULATORY_CARE_PROVIDER_SITE_OTHER): Payer: Medicare Other | Admitting: Orthopaedic Surgery

## 2017-12-14 DIAGNOSIS — N183 Chronic kidney disease, stage 3 (moderate): Secondary | ICD-10-CM | POA: Diagnosis not present

## 2017-12-14 DIAGNOSIS — M1990 Unspecified osteoarthritis, unspecified site: Secondary | ICD-10-CM | POA: Diagnosis not present

## 2017-12-14 DIAGNOSIS — I129 Hypertensive chronic kidney disease with stage 1 through stage 4 chronic kidney disease, or unspecified chronic kidney disease: Secondary | ICD-10-CM | POA: Diagnosis not present

## 2017-12-14 DIAGNOSIS — E669 Obesity, unspecified: Secondary | ICD-10-CM | POA: Diagnosis not present

## 2017-12-14 DIAGNOSIS — Z1389 Encounter for screening for other disorder: Secondary | ICD-10-CM | POA: Diagnosis not present

## 2017-12-14 DIAGNOSIS — K219 Gastro-esophageal reflux disease without esophagitis: Secondary | ICD-10-CM | POA: Diagnosis not present

## 2017-12-14 DIAGNOSIS — E042 Nontoxic multinodular goiter: Secondary | ICD-10-CM | POA: Diagnosis not present

## 2017-12-14 DIAGNOSIS — J45909 Unspecified asthma, uncomplicated: Secondary | ICD-10-CM | POA: Diagnosis not present

## 2017-12-14 DIAGNOSIS — E7849 Other hyperlipidemia: Secondary | ICD-10-CM | POA: Diagnosis not present

## 2017-12-14 DIAGNOSIS — K649 Unspecified hemorrhoids: Secondary | ICD-10-CM | POA: Diagnosis not present

## 2017-12-14 DIAGNOSIS — R0982 Postnasal drip: Secondary | ICD-10-CM | POA: Diagnosis not present

## 2017-12-14 DIAGNOSIS — I10 Essential (primary) hypertension: Secondary | ICD-10-CM | POA: Diagnosis not present

## 2018-01-04 DIAGNOSIS — D2272 Melanocytic nevi of left lower limb, including hip: Secondary | ICD-10-CM | POA: Diagnosis not present

## 2018-01-04 DIAGNOSIS — L918 Other hypertrophic disorders of the skin: Secondary | ICD-10-CM | POA: Diagnosis not present

## 2018-01-04 DIAGNOSIS — D1801 Hemangioma of skin and subcutaneous tissue: Secondary | ICD-10-CM | POA: Diagnosis not present

## 2018-01-04 DIAGNOSIS — Z85828 Personal history of other malignant neoplasm of skin: Secondary | ICD-10-CM | POA: Diagnosis not present

## 2018-01-04 DIAGNOSIS — L821 Other seborrheic keratosis: Secondary | ICD-10-CM | POA: Diagnosis not present

## 2018-01-04 DIAGNOSIS — L570 Actinic keratosis: Secondary | ICD-10-CM | POA: Diagnosis not present

## 2018-01-20 DIAGNOSIS — R05 Cough: Secondary | ICD-10-CM | POA: Diagnosis not present

## 2018-01-20 DIAGNOSIS — E042 Nontoxic multinodular goiter: Secondary | ICD-10-CM | POA: Diagnosis not present

## 2018-01-20 DIAGNOSIS — Z6835 Body mass index (BMI) 35.0-35.9, adult: Secondary | ICD-10-CM | POA: Diagnosis not present

## 2018-01-20 DIAGNOSIS — J209 Acute bronchitis, unspecified: Secondary | ICD-10-CM | POA: Diagnosis not present

## 2018-01-24 ENCOUNTER — Other Ambulatory Visit: Payer: Self-pay | Admitting: Family Medicine

## 2018-01-24 DIAGNOSIS — E042 Nontoxic multinodular goiter: Secondary | ICD-10-CM

## 2018-02-01 ENCOUNTER — Ambulatory Visit
Admission: RE | Admit: 2018-02-01 | Discharge: 2018-02-01 | Disposition: A | Discharge status: Home / Self Care | Payer: Medicare Other | Source: Ambulatory Visit | Attending: Family Medicine | Admitting: Family Medicine

## 2018-02-01 DIAGNOSIS — E042 Nontoxic multinodular goiter: Secondary | ICD-10-CM

## 2018-02-22 DIAGNOSIS — H52203 Unspecified astigmatism, bilateral: Secondary | ICD-10-CM | POA: Diagnosis not present

## 2018-02-22 DIAGNOSIS — Z961 Presence of intraocular lens: Secondary | ICD-10-CM | POA: Diagnosis not present

## 2018-02-22 DIAGNOSIS — H04123 Dry eye syndrome of bilateral lacrimal glands: Secondary | ICD-10-CM | POA: Diagnosis not present

## 2018-04-21 DIAGNOSIS — Z23 Encounter for immunization: Secondary | ICD-10-CM | POA: Diagnosis not present

## 2018-05-23 DIAGNOSIS — M545 Low back pain: Secondary | ICD-10-CM | POA: Diagnosis not present

## 2018-05-23 DIAGNOSIS — Z6834 Body mass index (BMI) 34.0-34.9, adult: Secondary | ICD-10-CM | POA: Diagnosis not present

## 2018-05-23 DIAGNOSIS — I10 Essential (primary) hypertension: Secondary | ICD-10-CM | POA: Diagnosis not present

## 2018-06-07 ENCOUNTER — Encounter (INDEPENDENT_AMBULATORY_CARE_PROVIDER_SITE_OTHER): Payer: Self-pay | Admitting: Orthopaedic Surgery

## 2018-06-07 ENCOUNTER — Ambulatory Visit (INDEPENDENT_AMBULATORY_CARE_PROVIDER_SITE_OTHER): Payer: Medicare Other | Admitting: Orthopaedic Surgery

## 2018-06-07 DIAGNOSIS — Z96641 Presence of right artificial hip joint: Secondary | ICD-10-CM

## 2018-06-07 DIAGNOSIS — M7061 Trochanteric bursitis, right hip: Secondary | ICD-10-CM | POA: Diagnosis not present

## 2018-06-07 MED ORDER — LIDOCAINE HCL 1 % IJ SOLN
3.0000 mL | INTRAMUSCULAR | Status: AC | PRN
  Administered 2018-06-07: 3 mL

## 2018-06-07 MED ORDER — METHYLPREDNISOLONE ACETATE 40 MG/ML IJ SUSP
40.0000 mg | INTRAMUSCULAR | Status: AC | PRN
  Administered 2018-06-07: 40 mg via INTRA_ARTICULAR

## 2018-06-07 NOTE — Progress Notes (Signed)
Office Visit Note   Patient: Cassidy Garcia           Date of Birth: [birth year]           MRN: [id number] Visit Date: 06/07/2018              Requested by: Burnard Bunting, MD [address] [place], [place] [address] PCP: Burnard Bunting, MD   Assessment & Plan: Visit Diagnoses:  1. Trochanteric bursitis, right hip   2. History of total replacement of right hip     Plan: Per her wishes I did provide injection of the trochanteric area of the right hip.  This with a steroid and lidocaine.  I will send her to formal physical therapy to work on her hip and her back.  I would like to see her back in about 6 weeks to see how she is doing overall.  If she still having some back symptoms we may consider an AP and lateral lumbar spine depending on how she is feeling in the response she is getting from therapy.  Follow-Up Instructions: Return in about 6 years (around 06/07/2024).   Orders:  Orders Placed This Encounter  Procedures  . Large Joint Inj   No orders of the defined types were placed in this encounter.     Procedures: Large Joint Inj: R greater trochanter on 06/07/2018 2:46 PM Indications: pain and diagnostic evaluation Details: 22 G 1.5 in needle, lateral approach  Arthrogram: No  Medications: 3 mL lidocaine 1 %; 40 mg methylPREDNISolone acetate 40 MG/ML Outcome: tolerated well, no immediate complications Procedure, treatment alternatives, risks and benefits explained, specific risks discussed. Consent was given by the patient. Immediately prior to procedure a time out was called to verify the correct patient, procedure, equipment, support staff and site/side marked as required. Patient was prepped and draped in the usual sterile fashion.       Clinical Data: No additional findings.   Subjective: Chief Complaint  Patient presents with  . Right Hip - Pain  The patient comes in with chief complaint of right hip pain.  She is been having trochanteric pain for  some time now.  We did perform an anterior hip replacement on her about 7 years ago.  He denies any groin pain or any issues with hip replacement.  We did place a steroid injection in the trochanteric area 1 year ago.  She is got more active with exercising.  She is 72 years old.  She is been on some tramadol intermittently.  I told her she get back to [place] if need be.  She has not been through any formal physical therapy.  She is not a diabetic.  HPI  Review of Systems She currently denies any headache, chest pain, shortness of breath, fever, chills, nausea, vomiting.  Objective: Vital Signs: There were no vitals taken for this visit.  Physical Exam She is alert oriented x3 and in no acute distress Ortho Exam Examination of the right hip shows that moves fluidly with no pain or problems at all.  Her leg lengths are equal.  When I had her lay on her side her right hip hurts over the tip of greater trochanter and she does have facet related paraspinal pain to the right side. Specialty Comments:  No specialty comments available.  Imaging: No results found.   PMFS History: Patient Active Problem List   Diagnosis Date Noted  . Pain in right hip 06/29/2017  . Trochanteric bursitis, right hip 06/29/2017  .  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 Great toe pain, left 12/04/2016  . Chronic right shoulder pain 12/04/2016  . Degenerative arthritis of hip 12/10/2011  . Chest pain 07/06/2011  . HTN (hypertension) 07/06/2011  . Hyperlipidemia 07/06/2011   Past Medical History:  Diagnosis Date  . Arthritis   . Asthma   . Bronchitis 03/2016  . Chronic kidney disease    stage 3  . Colon polyps    tubular adenoma  . Constipation   . Depression   . Diverticulosis   . GERD (gastroesophageal reflux disease)   . Hemorrhoids   . Hyperlipidemia   . Hypertension   . Hypothyroidism   . Shortness of breath    with exertion on occasion  . Sinusitis   . Skin cancer       Family History  Problem Relation Age of Onset  . Ovarian cancer Mother   . Colon cancer Neg Hx     Past Surgical History:  Procedure Laterality Date  . ABDOMINAL HYSTERECTOMY    . CHOLECYSTECTOMY    . COLONOSCOPY  02/12/2011  . FEET SURGERY    . HAND SURGERY    . NASAL SINUS SURGERY    . POLYPECTOMY    . TOTAL HIP ARTHROPLASTY  12/10/2011   Procedure: TOTAL HIP ARTHROPLASTY ANTERIOR APPROACH;  Surgeon: Mcarthur Rossetti, MD;  Location: [facility];  Service: Orthopedics;  Laterality: Right;  Right Total Hip Arthroplasty, Anterior Approach   Social History   Occupational History    Employer: RETIRED  Tobacco Use  . Smoking status: Former Smoker    Last attempt to quit: 08/16/2001    Years since quitting: 16.8  . Smokeless tobacco: Never Used  Substance and Sexual Activity  . Alcohol use: No    Comment: rare  . Drug use: No  . Sexual activity: Not on file

## 2018-06-12 DIAGNOSIS — E042 Nontoxic multinodular goiter: Secondary | ICD-10-CM | POA: Diagnosis not present

## 2018-06-12 DIAGNOSIS — E7849 Other hyperlipidemia: Secondary | ICD-10-CM | POA: Diagnosis not present

## 2018-06-12 DIAGNOSIS — I10 Essential (primary) hypertension: Secondary | ICD-10-CM | POA: Diagnosis not present

## 2018-06-12 DIAGNOSIS — R82998 Other abnormal findings in urine: Secondary | ICD-10-CM | POA: Diagnosis not present

## 2018-06-19 DIAGNOSIS — M545 Low back pain: Secondary | ICD-10-CM | POA: Diagnosis not present

## 2018-06-19 DIAGNOSIS — E669 Obesity, unspecified: Secondary | ICD-10-CM | POA: Diagnosis not present

## 2018-06-19 DIAGNOSIS — M1990 Unspecified osteoarthritis, unspecified site: Secondary | ICD-10-CM | POA: Diagnosis not present

## 2018-06-19 DIAGNOSIS — I10 Essential (primary) hypertension: Secondary | ICD-10-CM | POA: Diagnosis not present

## 2018-06-19 DIAGNOSIS — Z6834 Body mass index (BMI) 34.0-34.9, adult: Secondary | ICD-10-CM | POA: Diagnosis not present

## 2018-06-19 DIAGNOSIS — I129 Hypertensive chronic kidney disease with stage 1 through stage 4 chronic kidney disease, or unspecified chronic kidney disease: Secondary | ICD-10-CM | POA: Diagnosis not present

## 2018-06-19 DIAGNOSIS — Z0000 Encounter for general adult medical examination without abnormal findings: Secondary | ICD-10-CM | POA: Diagnosis not present

## 2018-06-19 DIAGNOSIS — N183 Chronic kidney disease, stage 3 (moderate): Secondary | ICD-10-CM | POA: Diagnosis not present

## 2018-06-19 DIAGNOSIS — J45909 Unspecified asthma, uncomplicated: Secondary | ICD-10-CM | POA: Diagnosis not present

## 2018-06-19 DIAGNOSIS — K649 Unspecified hemorrhoids: Secondary | ICD-10-CM | POA: Diagnosis not present

## 2018-06-19 DIAGNOSIS — E042 Nontoxic multinodular goiter: Secondary | ICD-10-CM | POA: Diagnosis not present

## 2018-06-19 DIAGNOSIS — E7849 Other hyperlipidemia: Secondary | ICD-10-CM | POA: Diagnosis not present

## 2018-06-29 DIAGNOSIS — M545 Low back pain: Secondary | ICD-10-CM | POA: Diagnosis not present

## 2018-06-29 DIAGNOSIS — M7061 Trochanteric bursitis, right hip: Secondary | ICD-10-CM | POA: Diagnosis not present

## 2018-06-30 DIAGNOSIS — Z6835 Body mass index (BMI) 35.0-35.9, adult: Secondary | ICD-10-CM | POA: Diagnosis not present

## 2018-06-30 DIAGNOSIS — S90829A Blister (nonthermal), unspecified foot, initial encounter: Secondary | ICD-10-CM | POA: Diagnosis not present

## 2018-06-30 DIAGNOSIS — I10 Essential (primary) hypertension: Secondary | ICD-10-CM | POA: Diagnosis not present

## 2018-06-30 DIAGNOSIS — Z1212 Encounter for screening for malignant neoplasm of rectum: Secondary | ICD-10-CM | POA: Diagnosis not present

## 2018-07-18 DIAGNOSIS — M545 Low back pain: Secondary | ICD-10-CM | POA: Diagnosis not present

## 2018-07-18 DIAGNOSIS — M7061 Trochanteric bursitis, right hip: Secondary | ICD-10-CM | POA: Diagnosis not present

## 2018-07-19 DIAGNOSIS — Z1231 Encounter for screening mammogram for malignant neoplasm of breast: Secondary | ICD-10-CM | POA: Diagnosis not present

## 2018-07-20 ENCOUNTER — Ambulatory Visit (INDEPENDENT_AMBULATORY_CARE_PROVIDER_SITE_OTHER): Payer: Medicare Other | Admitting: Orthopaedic Surgery

## 2018-08-02 DIAGNOSIS — M545 Low back pain: Secondary | ICD-10-CM | POA: Diagnosis not present

## 2018-08-02 DIAGNOSIS — M7061 Trochanteric bursitis, right hip: Secondary | ICD-10-CM | POA: Diagnosis not present

## 2018-08-03 ENCOUNTER — Encounter (INDEPENDENT_AMBULATORY_CARE_PROVIDER_SITE_OTHER): Payer: Self-pay | Admitting: Orthopaedic Surgery

## 2018-08-03 ENCOUNTER — Ambulatory Visit (INDEPENDENT_AMBULATORY_CARE_PROVIDER_SITE_OTHER): Payer: Medicare Other | Admitting: Orthopaedic Surgery

## 2018-08-03 DIAGNOSIS — Z96641 Presence of right artificial hip joint: Secondary | ICD-10-CM | POA: Diagnosis not present

## 2018-08-03 DIAGNOSIS — M7061 Trochanteric bursitis, right hip: Secondary | ICD-10-CM | POA: Diagnosis not present

## 2018-08-03 NOTE — Progress Notes (Signed)
The patient is following up after having a trochanteric injection in her right hip secondary trochanteric bursitis.  She has a history of a total hip arthroplasty on that side that we did 7 years ago.  The hip arthroplasty is done well.  She has had no groin pain.  We put her through physical therapy recently as well.  She says overall she is doing much better overall.  She says her hip has not been "challenging her" lately.  She says she is doing okay overall.  On exam I move her right hip around easily without any issues.  She has just some mild pain to palpation of the trochanteric area.  She will continue stretching exercises.  We can always inject this area again if needed.  All questions and concerns were answered and addressed.  Follow-up will be as needed.

## 2018-09-20 ENCOUNTER — Ambulatory Visit (INDEPENDENT_AMBULATORY_CARE_PROVIDER_SITE_OTHER): Payer: Medicare Other | Admitting: Orthopaedic Surgery

## 2018-09-20 ENCOUNTER — Encounter (INDEPENDENT_AMBULATORY_CARE_PROVIDER_SITE_OTHER): Payer: Self-pay | Admitting: Orthopaedic Surgery

## 2018-09-20 ENCOUNTER — Ambulatory Visit (INDEPENDENT_AMBULATORY_CARE_PROVIDER_SITE_OTHER): Payer: Medicare Other

## 2018-09-20 DIAGNOSIS — Z96641 Presence of right artificial hip joint: Secondary | ICD-10-CM

## 2018-09-20 DIAGNOSIS — M7061 Trochanteric bursitis, right hip: Secondary | ICD-10-CM | POA: Diagnosis not present

## 2018-09-20 DIAGNOSIS — M25571 Pain in right ankle and joints of right foot: Secondary | ICD-10-CM

## 2018-09-20 MED ORDER — METHYLPREDNISOLONE 4 MG PO TABS: ORAL_TABLET | ORAL | 0 refills | Status: DC

## 2018-09-20 NOTE — Progress Notes (Signed)
Office Visit Note   Patient: Cassidy Garcia           Date of Birth: 12-06-1945           MRN: [id number] Visit Date: 09/20/2018              Requested by: Burnard Bunting, MD [address] [place], [place] [address] PCP: Burnard Bunting, MD   Assessment & Plan: Visit Diagnoses:  1. Pain in right ankle and joints of right foot   2. Trochanteric bursitis, right hip     Plan: Place her on a Medrol Dosepak.  She is given an ASO for the right ankle for support.  Recommend she try IT band stretching exercises or lying down instead of doing a standing maneuver which caused her to put increased stress on the right ankle.  See her back in approximately 2 weeks check her response to the Dosepak reexamine her ankle and her right hip.  Follow-Up Instructions: Return in about 2 weeks (around 10/04/2018).   Orders:  Orders Placed This Encounter  Procedures  . XR Ankle Complete Right   Meds ordered this encounter  Medications  . methylPREDNISolone (MEDROL) 4 MG tablet    Sig: Take as directed    Dispense:  21 tablet    Refill:  0      Procedures: No procedures performed   Clinical Data: No additional findings.   Subjective: Chief Complaint  Patient presents with  . Right Hip - Pain    HPI Ms. Cassidy Garcia returns today due to right hip pain when walking.  She denies any back pain denies any radicular symptoms down the leg.  Pains been ongoing for the past week.  She states that the trochanteric injection December 19 helped a lot and then with physical therapy she had a period of what she describes as "pain-free" in regards to her right hip.  Little over a week ago she did do some strenuous exercises because she had not been doing the exercises for IT band stretching and then 2 days later she developed a pain in the hip.  Approximately 3 to 4 days ago she developed stabbing pain lateral aspect of her right ankle.  She denies any injury.  Denies any numbness tingling down the right leg.   She said no treatment for the ankle pain.  Review of Systems See HPI otherwise negative  Objective: Vital Signs: There were no vitals taken for this visit.  Physical Exam Constitutional:      Appearance: Normal appearance. She is not ill-appearing or diaphoretic.  Pulmonary:     Effort: Pulmonary effort is normal.  Neurological:     Mental Status: She is oriented to person, place, and time.  Psychiatric:        Mood and Affect: Mood normal.     Ortho Exam Bilateral hips excellent range of motion without pain.  Tenderness over the right greater trochanteric region.  5 out of 5 strength throughout lower extremities against resistance.  Dorsal pedal pulses are 2+ bilaterally.  Sensation grossly intact bilateral feet light touch.  No impending rashes skin lesions ulcerations ecchymosis bilateral feet.  5 out of 5 strength with inversion eversion bilateral ankles against resistance without pain.  Nontender over the posterior tibial tendon and the peroneal tendons bilaterally.  Good range of motion bilateral ankles without pain.  Nontender over the Achilles bilaterally.  Achilles intact bilaterally.  Slightly edema of the right ankle compared to left.  She is tender over  the right distal fibular shaft.  Left foot ankle nontender throughout.  Remainder of the right foot nontender throughout Specialty Comments:  No specialty comments available.  Imaging: Xr Ankle Complete Right  Result Date: 09/20/2018 3 views right ankle: Talus well located within the ankle mortise.  No diastases initiated.  Hindfoot with some mild to moderate arthritic changes.  Os trigonum present.  Fibular epiphysis nonunion.    PMFS History: Patient Active Problem List   Diagnosis Date Noted  . Pain in right hip 06/29/2017  . Trochanteric bursitis, right hip 06/29/2017  . 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 Great toe pain, left 12/04/2016  . Chronic right shoulder pain 12/04/2016  .  Degenerative arthritis of hip 12/10/2011  . Chest pain 07/06/2011  . HTN (hypertension) 07/06/2011  . Hyperlipidemia 07/06/2011   Past Medical History:  Diagnosis Date  . Arthritis   . Asthma   . Bronchitis 03/2016  . Chronic kidney disease    stage 3  . Colon polyps    tubular adenoma  . Constipation   . Depression   . Diverticulosis   . GERD (gastroesophageal reflux disease)   . Hemorrhoids   . Hyperlipidemia   . Hypertension   . Hypothyroidism   . Shortness of breath    with exertion on occasion  . Sinusitis   . Skin cancer     Family History  Problem Relation Age of Onset  . Ovarian cancer Mother   . Colon cancer Neg Hx     Past Surgical History:  Procedure Laterality Date  . ABDOMINAL HYSTERECTOMY    . CHOLECYSTECTOMY    . COLONOSCOPY  02/12/2011  . FEET SURGERY    . HAND SURGERY    . NASAL SINUS SURGERY    . POLYPECTOMY    . TOTAL HIP ARTHROPLASTY  12/10/2011   Procedure: TOTAL HIP ARTHROPLASTY ANTERIOR APPROACH;  Surgeon: Mcarthur Rossetti, MD;  Location: [facility];  Service: Orthopedics;  Laterality: Right;  Right Total Hip Arthroplasty, Anterior Approach   Social History   Occupational History    Employer: RETIRED  Tobacco Use  . Smoking status: Former Smoker    Last attempt to quit: 08/16/2001    Years since quitting: 17.1  . Smokeless tobacco: Never Used  Substance and Sexual Activity  . Alcohol use: No    Comment: rare  . Drug use: No  . Sexual activity: Not on file

## 2018-10-03 ENCOUNTER — Ambulatory Visit (INDEPENDENT_AMBULATORY_CARE_PROVIDER_SITE_OTHER): Payer: Medicare Other | Admitting: Orthopaedic Surgery

## 2018-10-03 ENCOUNTER — Encounter (INDEPENDENT_AMBULATORY_CARE_PROVIDER_SITE_OTHER): Payer: Self-pay | Admitting: Orthopaedic Surgery

## 2018-10-03 DIAGNOSIS — Z96641 Presence of right artificial hip joint: Secondary | ICD-10-CM

## 2018-10-03 DIAGNOSIS — M7061 Trochanteric bursitis, right hip: Secondary | ICD-10-CM

## 2018-10-03 DIAGNOSIS — M25571 Pain in right ankle and joints of right foot: Secondary | ICD-10-CM

## 2018-10-03 DIAGNOSIS — M7062 Trochanteric bursitis, left hip: Secondary | ICD-10-CM

## 2018-10-03 MED ORDER — OXYCODONE-ACETAMINOPHEN 5-325 MG PO TABS: 1.0000 | ORAL_TABLET | ORAL | 0 refills | Status: DC | PRN

## 2018-10-03 NOTE — Progress Notes (Signed)
Office Visit Note   Patient: Cassidy Garcia           Date of Birth: 1945-12-19           MRN: [id number] Visit Date: 10/03/2018              Requested by: Burnard Bunting, MD [address] [place], [place] [address] PCP: Burnard Bunting, MD   Assessment & Plan: Visit Diagnoses:  1. Pain in right ankle and joints of right foot   2. Trochanteric bursitis, right hip   3. History of total replacement of right hip   4. Trochanteric bursitis, left hip     Plan: She will continue IT band stretching bilaterally.  She will follow-up with [place] as needed.  She is given 5 Percocet to be previously coming week if he feels he is doing a lot of walking.  She understands that not able to prescribe pain medications long-term for this condition.  Questions were encouraged and answered by Dr. self.  Follow-Up Instructions: Return if symptoms worsen or fail to improve.   Orders:  No orders of the defined types were placed in this encounter.  No orders of the defined types were placed in this encounter.     Procedures: No procedures performed   Clinical Data: No additional findings.   Subjective: Chief Complaint  Patient presents with  . Right Hip - Follow-up  . Left Hip - Follow-up    HPI Cassidy Garcia returns today status post IT band stretching right hip Medrol Dosepak.  She states that her right hip pain is overall much improved.  She did develop some left hip pain lying on the hip.  Said no back pain.  She is having no radicular symptoms down the leg.  States that her right ankle pain is totally resolved.  She has had no new injuries.  She has felt the water therapy has helped with her pain.  She has an upcoming trip next week but she will be walking long distances and is asking for some pain medicine to take while on the trip. Review of Systems   Objective: Vital Signs: There were no vitals taken for this visit.  Physical Exam Constitutional:      Appearance: She is not  ill-appearing or diaphoretic.  Pulmonary:     Effort: Pulmonary effort is normal.  Neurological:     Mental Status: She is alert and oriented to person, place, and time.     Ortho Exam Bilateral hips excellent range of motion without pain.  Tenderness over the trochanteric region bilateral hips right greater than left. Specialty Comments:  No specialty comments available.  Imaging: No results found.   PMFS History: Patient Active Problem List   Diagnosis Date Noted  . Pain in right hip 06/29/2017  . Trochanteric bursitis, right hip 06/29/2017  . 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 Great toe pain, left 12/04/2016  . Chronic right shoulder pain 12/04/2016  . Degenerative arthritis of hip 12/10/2011  . Chest pain 07/06/2011  . HTN (hypertension) 07/06/2011  . Hyperlipidemia 07/06/2011   Past Medical History:  Diagnosis Date  . Arthritis   . Asthma   . Bronchitis 03/2016  . Chronic kidney disease    stage 3  . Colon polyps    tubular adenoma  . Constipation   . Depression   . Diverticulosis   . GERD (gastroesophageal reflux disease)   . Hemorrhoids   . Hyperlipidemia   .  Hypertension   . Hypothyroidism   . Shortness of breath    with exertion on occasion  . Sinusitis   . Skin cancer     Family History  Problem Relation Age of Onset  . Ovarian cancer Mother   . Colon cancer Neg Hx     Past Surgical History:  Procedure Laterality Date  . ABDOMINAL HYSTERECTOMY    . CHOLECYSTECTOMY    . COLONOSCOPY  02/12/2011  . FEET SURGERY    . HAND SURGERY    . NASAL SINUS SURGERY    . POLYPECTOMY    . TOTAL HIP ARTHROPLASTY  12/10/2011   Procedure: TOTAL HIP ARTHROPLASTY ANTERIOR APPROACH;  Surgeon: Mcarthur Rossetti, MD;  Location: [facility];  Service: Orthopedics;  Laterality: Right;  Right Total Hip Arthroplasty, Anterior Approach   Social History   Occupational History    Employer: RETIRED  Tobacco Use  . Smoking status: Former Smoker     Last attempt to quit: 08/16/2001    Years since quitting: 17.1  . Smokeless tobacco: Never Used  Substance and Sexual Activity  . Alcohol use: No    Comment: rare  . Drug use: No  . Sexual activity: Not on file

## 2018-10-04 IMAGING — US US THYROID
1 series · 12 of 25 positions shown · non-contrast
Comparison: [DATE];

CLINICAL DATA: Prior ultrasound follow-up. History of previous
left-sided thyroid nodule fine-needle aspiration in [DATE]

EXAM:
THYROID ULTRASOUND
TECHNIQUE: Ultrasound examination of the thyroid gland and adjacent soft
tissues was performed.

[Series 1: us thyroid · 0.05mm/px · 12 of 95 slices shown]
[im 4/95]
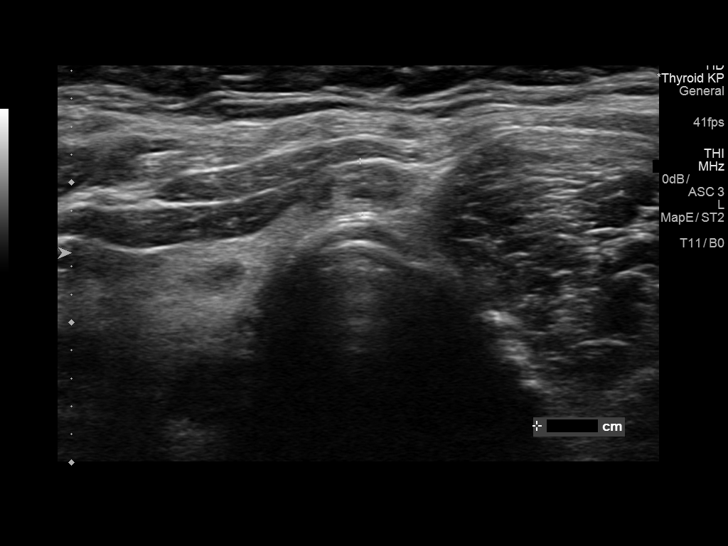
[im 12/95]
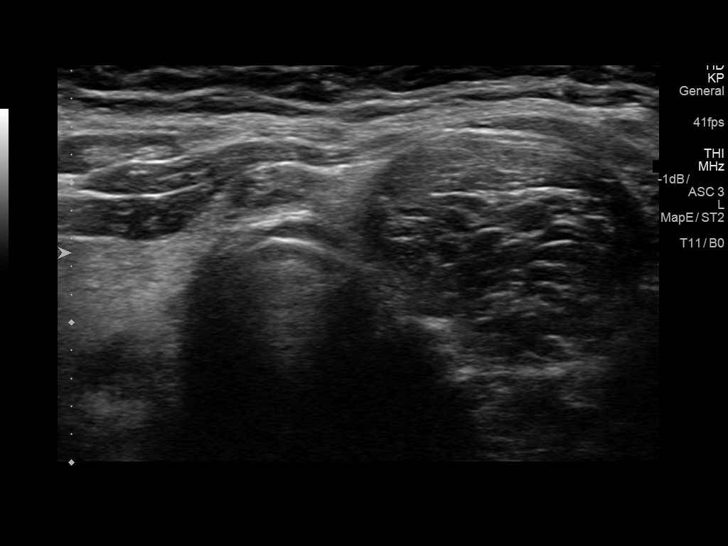
[im 20/95]
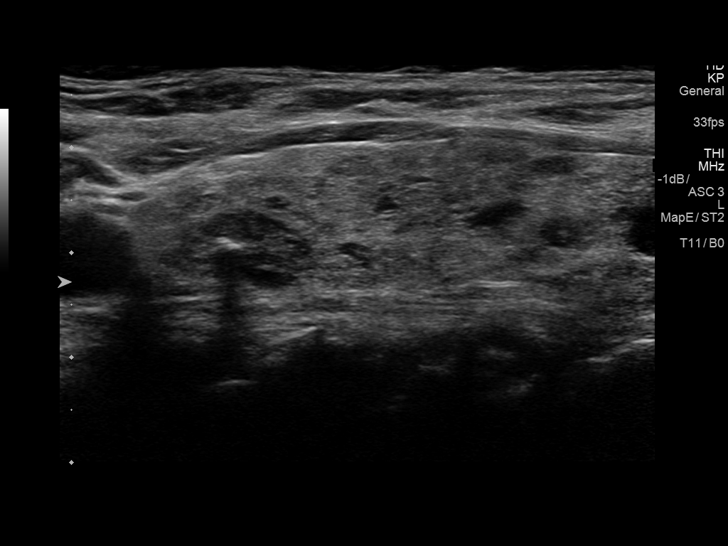
[im 28/95]
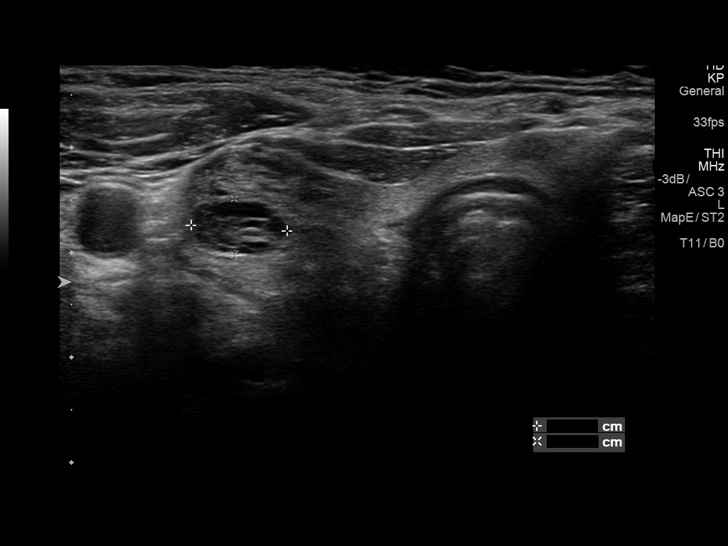
[im 36/95]
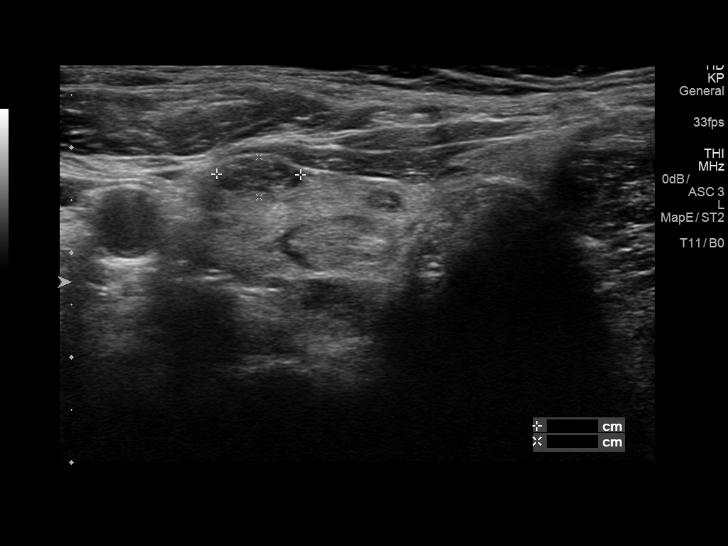
[im 44/95]
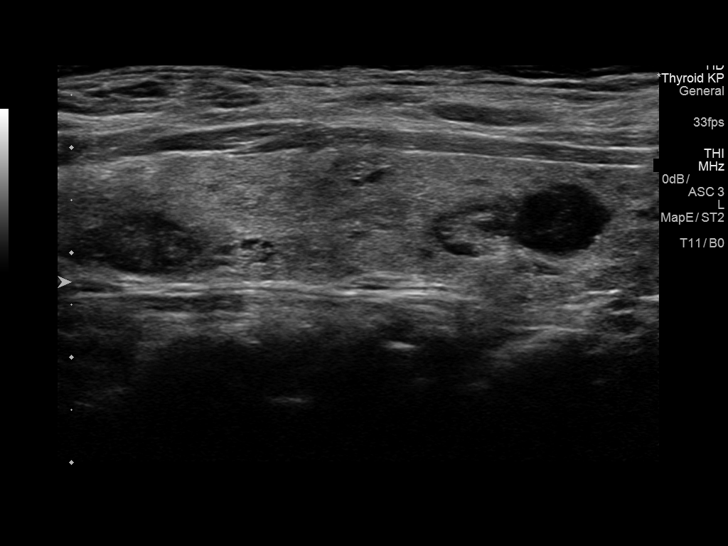
[im 51/95]
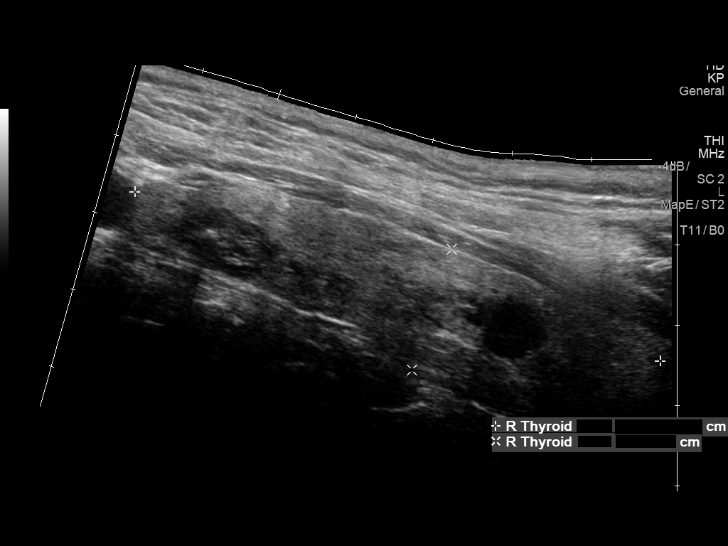
[im 59/95]
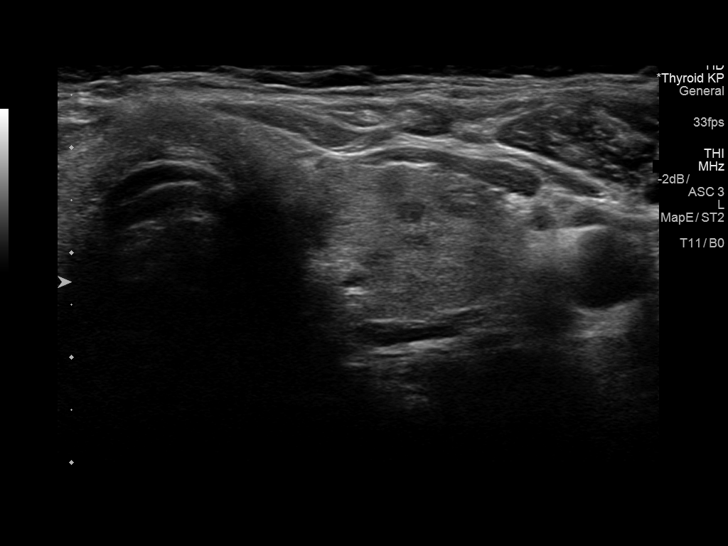
[im 67/95]
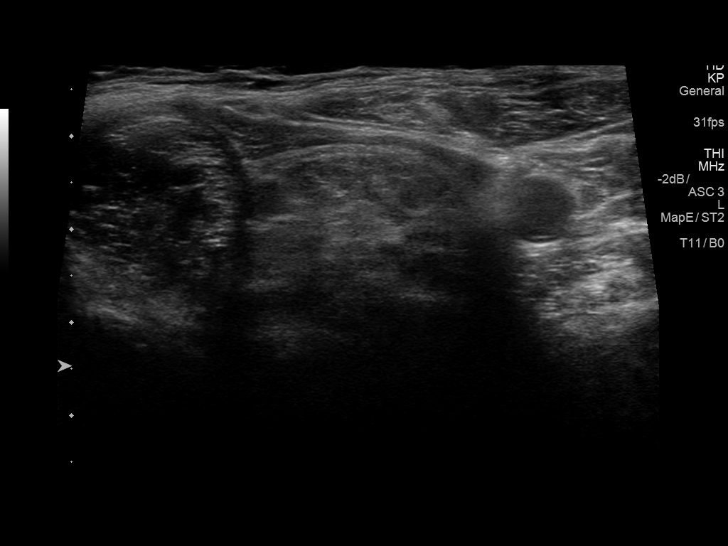
[im 75/95]
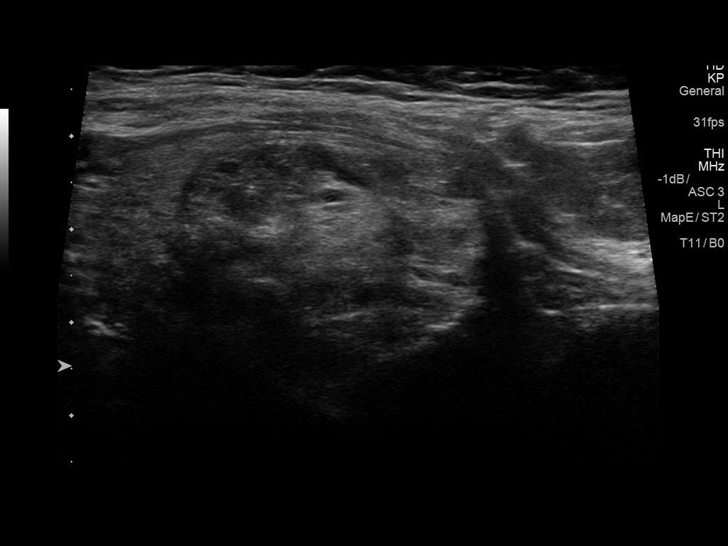
[im 83/95]
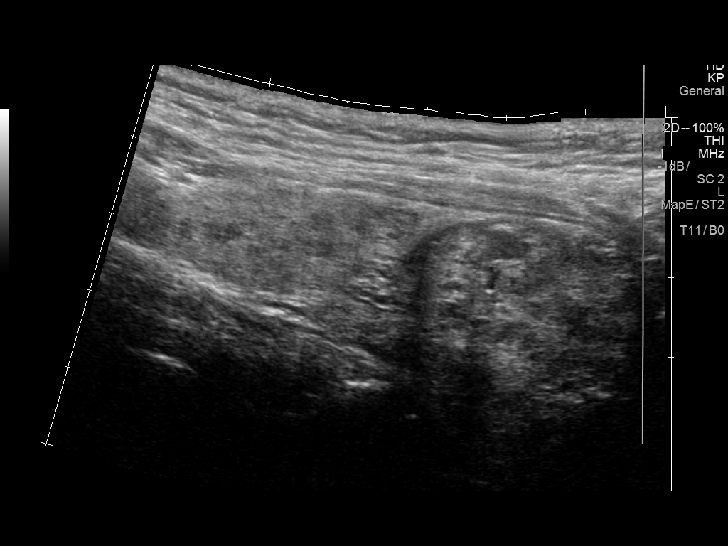
[im 91/95]
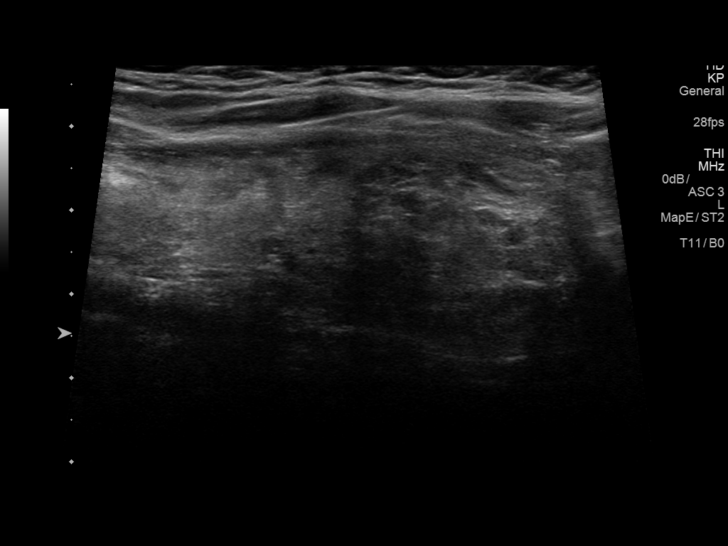

[12 of 25 positions shown; findings below may reference images not displayed]

[DATE]; ultrasound-guided left-sided
thyroid nodule fine-needle aspiration - [DATE]
FINDINGS: Parenchymal Echotexture: Mildly heterogenous

Isthmus: Normal in size measures 0.4 cm in diameter, unchanged

Right lobe: Enlarged measuring 6.9 x 1.6 x 2.4 cm, unchanged,
previously, 6.6 x 1.6 x 2.3 cm

Left lobe: Enlarged measuring 6.4 x 2.4 x 2.4 cm, unchanged,
previously, 6.8 x 2.7 x 2.3 cm.

_________________________________________________________

Estimated total number of nodules >/= 1 cm: 5

Number of spongiform nodules >/=  2 cm not described below (TR1): 0

Number of mixed cystic and solid nodules >/= 1.5 cm not described
below (TR2): 0

_________________________________________________________

The 2.8 x 2.0 x 2.2 cm spongiform/benign-appearing nodule within the
left side the thyroid isthmus (labeled 1) is grossly unchanged
compared to the [DATE] examination, previously, 2.9 x 2.1 x 1.7 cm
with slight differences likely total to scan plane projection. This
nodule does not meet imaging criteria to recommend percutaneous
sampling or continued dedicated follow-up.

Punctate (approximately 0.6 cm) the approximately 0.6 cm nodule
within the right-sided the thyroid isthmus (labeled 2) is grossly
unchanged compared to the [DATE] examination. Stability for greater
than 5 years is indicative of benign etiology.

The approximately 1.3 x 0.9 x 0.8 cm nodule within the superior pole
the right lobe of the thyroid (labeled 3) is unchanged compared to
the [DATE] examination, previously, 1.6 x 1.1 x 1.0 cm. Stability
for greater than 5 years is indicative of a benign etiology.

The 0.9 cm anechoic cyst (labeled 4) as well as the approximately
0.7 cm cyst (labeled 5) within the mid aspect of the right lobe of
the thyroid are both grossly unchanged compared to the [DATE]
examination, previously, 0.8 cm and 0.9 cm respectively. Stability
for greater than 5 years is indicative of a benign etiology.

The 1.1 cm anechoic cyst (labeled 6) within the inferior pole the
right lobe of the thyroid is unchanged compared to the [DATE]
examination, previously, 1.3 cm. Stability for greater than 5 years
is indicative of benign etiology.

The 1.1 cm spongiform/benign appearing nodule (labeled 7) within the
mid aspect the left lobe of the thyroid is unchanged in hind site
compared to the [DATE] examination. Stability for greater than 5
years indicative of benign etiology.

The previously biopsied approximately 3.2 x 2.5 x 2.3 cm nodule/mass
within the inferior pole the left lobe of the thyroid (labeled 8) is
unchanged compared to the [DATE] examination, previously, 3.3 x
x 2.8 cm. Correlation with prior biopsy results is recommended.
IMPRESSION: 1. Similar findings of multinodular goiter. No new or enlarging
thyroid nodules.
2. Previously biopsied approximately 3.2 cm nodule/mass within the
inferior pole the left lobe of the thyroid is unchanged compared to
the [DATE] examination. Correlation prior biopsy results is
recommended however additionally, stability for greater than 5 years
is suggestive of a benign etiology.
3. None of the remaining thyroid nodules meet imaging criteria to
recommend percutaneous sampling or continued dedicated follow-up as
the majority appear unchanged for greater than 5 years, indicative
of benign etiology.

The above is in keeping with the ACR TI-RADS recommendations - [HOSPITAL] [YY];[DATE].

## 2018-12-19 DIAGNOSIS — K5900 Constipation, unspecified: Secondary | ICD-10-CM | POA: Diagnosis not present

## 2018-12-20 DIAGNOSIS — I10 Essential (primary) hypertension: Secondary | ICD-10-CM | POA: Diagnosis not present

## 2018-12-20 DIAGNOSIS — Z1331 Encounter for screening for depression: Secondary | ICD-10-CM | POA: Diagnosis not present

## 2018-12-20 DIAGNOSIS — M545 Low back pain: Secondary | ICD-10-CM | POA: Diagnosis not present

## 2018-12-20 DIAGNOSIS — K5900 Constipation, unspecified: Secondary | ICD-10-CM | POA: Insufficient documentation

## 2018-12-20 DIAGNOSIS — E042 Nontoxic multinodular goiter: Secondary | ICD-10-CM | POA: Diagnosis not present

## 2018-12-20 DIAGNOSIS — J329 Chronic sinusitis, unspecified: Secondary | ICD-10-CM | POA: Diagnosis not present

## 2018-12-20 DIAGNOSIS — E785 Hyperlipidemia, unspecified: Secondary | ICD-10-CM | POA: Diagnosis not present

## 2018-12-20 DIAGNOSIS — K219 Gastro-esophageal reflux disease without esophagitis: Secondary | ICD-10-CM | POA: Diagnosis not present

## 2018-12-20 DIAGNOSIS — M1990 Unspecified osteoarthritis, unspecified site: Secondary | ICD-10-CM | POA: Diagnosis not present

## 2018-12-20 DIAGNOSIS — I129 Hypertensive chronic kidney disease with stage 1 through stage 4 chronic kidney disease, or unspecified chronic kidney disease: Secondary | ICD-10-CM | POA: Diagnosis not present

## 2018-12-20 DIAGNOSIS — N183 Chronic kidney disease, stage 3 (moderate): Secondary | ICD-10-CM | POA: Diagnosis not present

## 2018-12-20 DIAGNOSIS — J45909 Unspecified asthma, uncomplicated: Secondary | ICD-10-CM | POA: Diagnosis not present

## 2019-01-10 DIAGNOSIS — D225 Melanocytic nevi of trunk: Secondary | ICD-10-CM | POA: Diagnosis not present

## 2019-01-10 DIAGNOSIS — Z85828 Personal history of other malignant neoplasm of skin: Secondary | ICD-10-CM | POA: Diagnosis not present

## 2019-01-10 DIAGNOSIS — L821 Other seborrheic keratosis: Secondary | ICD-10-CM | POA: Diagnosis not present

## 2019-01-10 DIAGNOSIS — L570 Actinic keratosis: Secondary | ICD-10-CM | POA: Diagnosis not present

## 2019-05-18 DIAGNOSIS — Z23 Encounter for immunization: Secondary | ICD-10-CM | POA: Diagnosis not present

## 2019-06-28 DIAGNOSIS — E7849 Other hyperlipidemia: Secondary | ICD-10-CM | POA: Diagnosis not present

## 2019-06-28 DIAGNOSIS — E042 Nontoxic multinodular goiter: Secondary | ICD-10-CM | POA: Diagnosis not present

## 2019-07-02 DIAGNOSIS — E785 Hyperlipidemia, unspecified: Secondary | ICD-10-CM | POA: Diagnosis not present

## 2019-07-02 DIAGNOSIS — J45909 Unspecified asthma, uncomplicated: Secondary | ICD-10-CM | POA: Diagnosis not present

## 2019-07-02 DIAGNOSIS — E042 Nontoxic multinodular goiter: Secondary | ICD-10-CM | POA: Diagnosis not present

## 2019-07-02 DIAGNOSIS — M1990 Unspecified osteoarthritis, unspecified site: Secondary | ICD-10-CM | POA: Diagnosis not present

## 2019-07-02 DIAGNOSIS — Z0000 Encounter for general adult medical examination without abnormal findings: Secondary | ICD-10-CM | POA: Diagnosis not present

## 2019-07-02 DIAGNOSIS — K219 Gastro-esophageal reflux disease without esophagitis: Secondary | ICD-10-CM | POA: Diagnosis not present

## 2019-07-02 DIAGNOSIS — E669 Obesity, unspecified: Secondary | ICD-10-CM | POA: Diagnosis not present

## 2019-07-02 DIAGNOSIS — K5900 Constipation, unspecified: Secondary | ICD-10-CM | POA: Diagnosis not present

## 2019-07-02 DIAGNOSIS — N183 Chronic kidney disease, stage 3 unspecified: Secondary | ICD-10-CM | POA: Diagnosis not present

## 2019-07-02 DIAGNOSIS — I129 Hypertensive chronic kidney disease with stage 1 through stage 4 chronic kidney disease, or unspecified chronic kidney disease: Secondary | ICD-10-CM | POA: Diagnosis not present

## 2019-07-02 DIAGNOSIS — M545 Low back pain: Secondary | ICD-10-CM | POA: Diagnosis not present

## 2019-07-02 DIAGNOSIS — K649 Unspecified hemorrhoids: Secondary | ICD-10-CM | POA: Diagnosis not present

## 2019-07-25 DIAGNOSIS — Z1231 Encounter for screening mammogram for malignant neoplasm of breast: Secondary | ICD-10-CM | POA: Diagnosis not present

## 2019-09-03 DIAGNOSIS — K219 Gastro-esophageal reflux disease without esophagitis: Secondary | ICD-10-CM | POA: Diagnosis not present

## 2019-09-03 DIAGNOSIS — G43C0 Periodic headache syndromes in child or adult, not intractable: Secondary | ICD-10-CM | POA: Diagnosis not present

## 2019-09-03 DIAGNOSIS — R0602 Shortness of breath: Secondary | ICD-10-CM | POA: Diagnosis not present

## 2019-09-03 DIAGNOSIS — J45909 Unspecified asthma, uncomplicated: Secondary | ICD-10-CM | POA: Diagnosis not present

## 2019-09-03 DIAGNOSIS — I129 Hypertensive chronic kidney disease with stage 1 through stage 4 chronic kidney disease, or unspecified chronic kidney disease: Secondary | ICD-10-CM | POA: Diagnosis not present

## 2019-09-03 DIAGNOSIS — J3489 Other specified disorders of nose and nasal sinuses: Secondary | ICD-10-CM | POA: Diagnosis not present

## 2019-09-03 DIAGNOSIS — N1831 Chronic kidney disease, stage 3a: Secondary | ICD-10-CM | POA: Diagnosis not present

## 2019-09-03 DIAGNOSIS — Z20822 Contact with and (suspected) exposure to covid-19: Secondary | ICD-10-CM | POA: Diagnosis not present

## 2019-09-21 DIAGNOSIS — R82998 Other abnormal findings in urine: Secondary | ICD-10-CM | POA: Diagnosis not present

## 2019-09-21 DIAGNOSIS — G43C0 Periodic headache syndromes in child or adult, not intractable: Secondary | ICD-10-CM | POA: Diagnosis not present

## 2019-09-21 DIAGNOSIS — J45909 Unspecified asthma, uncomplicated: Secondary | ICD-10-CM | POA: Diagnosis not present

## 2019-09-21 DIAGNOSIS — I129 Hypertensive chronic kidney disease with stage 1 through stage 4 chronic kidney disease, or unspecified chronic kidney disease: Secondary | ICD-10-CM | POA: Diagnosis not present

## 2019-09-21 DIAGNOSIS — N1831 Chronic kidney disease, stage 3a: Secondary | ICD-10-CM | POA: Diagnosis not present

## 2020-01-06 DIAGNOSIS — E039 Hypothyroidism, unspecified: Secondary | ICD-10-CM | POA: Diagnosis not present

## 2020-01-06 DIAGNOSIS — R5383 Other fatigue: Secondary | ICD-10-CM | POA: Diagnosis not present

## 2020-01-06 DIAGNOSIS — R110 Nausea: Secondary | ICD-10-CM | POA: Diagnosis not present

## 2020-01-09 DIAGNOSIS — I129 Hypertensive chronic kidney disease with stage 1 through stage 4 chronic kidney disease, or unspecified chronic kidney disease: Secondary | ICD-10-CM | POA: Diagnosis not present

## 2020-01-09 DIAGNOSIS — E042 Nontoxic multinodular goiter: Secondary | ICD-10-CM | POA: Diagnosis not present

## 2020-01-09 DIAGNOSIS — R5383 Other fatigue: Secondary | ICD-10-CM | POA: Diagnosis not present

## 2020-01-09 DIAGNOSIS — R519 Headache, unspecified: Secondary | ICD-10-CM | POA: Diagnosis not present

## 2020-01-09 DIAGNOSIS — N1831 Chronic kidney disease, stage 3a: Secondary | ICD-10-CM | POA: Diagnosis not present

## 2020-01-09 DIAGNOSIS — Z1152 Encounter for screening for COVID-19: Secondary | ICD-10-CM | POA: Diagnosis not present

## 2020-01-10 DIAGNOSIS — D1722 Benign lipomatous neoplasm of skin and subcutaneous tissue of left arm: Secondary | ICD-10-CM | POA: Diagnosis not present

## 2020-01-10 DIAGNOSIS — D1801 Hemangioma of skin and subcutaneous tissue: Secondary | ICD-10-CM | POA: Diagnosis not present

## 2020-01-10 DIAGNOSIS — Z85828 Personal history of other malignant neoplasm of skin: Secondary | ICD-10-CM | POA: Diagnosis not present

## 2020-01-10 DIAGNOSIS — L812 Freckles: Secondary | ICD-10-CM | POA: Diagnosis not present

## 2020-01-10 DIAGNOSIS — L821 Other seborrheic keratosis: Secondary | ICD-10-CM | POA: Diagnosis not present

## 2020-01-10 DIAGNOSIS — D2272 Melanocytic nevi of left lower limb, including hip: Secondary | ICD-10-CM | POA: Diagnosis not present

## 2020-01-10 DIAGNOSIS — D2271 Melanocytic nevi of right lower limb, including hip: Secondary | ICD-10-CM | POA: Diagnosis not present

## 2020-01-10 DIAGNOSIS — L814 Other melanin hyperpigmentation: Secondary | ICD-10-CM | POA: Diagnosis not present

## 2020-01-16 ENCOUNTER — Other Ambulatory Visit: Payer: Self-pay | Admitting: Internal Medicine

## 2020-01-16 ENCOUNTER — Ambulatory Visit
Admission: RE | Admit: 2020-01-16 | Discharge: 2020-01-16 | Disposition: A | Discharge status: Home / Self Care | Payer: Medicare Other | Source: Ambulatory Visit | Attending: Internal Medicine | Admitting: Internal Medicine

## 2020-01-16 ENCOUNTER — Other Ambulatory Visit: Payer: Self-pay

## 2020-01-16 DIAGNOSIS — R112 Nausea with vomiting, unspecified: Secondary | ICD-10-CM | POA: Diagnosis not present

## 2020-01-16 DIAGNOSIS — K219 Gastro-esophageal reflux disease without esophagitis: Secondary | ICD-10-CM | POA: Diagnosis not present

## 2020-01-16 DIAGNOSIS — E669 Obesity, unspecified: Secondary | ICD-10-CM | POA: Diagnosis not present

## 2020-01-16 DIAGNOSIS — R5383 Other fatigue: Secondary | ICD-10-CM

## 2020-01-16 DIAGNOSIS — N183 Chronic kidney disease, stage 3 unspecified: Secondary | ICD-10-CM | POA: Diagnosis not present

## 2020-01-16 DIAGNOSIS — K5900 Constipation, unspecified: Secondary | ICD-10-CM | POA: Diagnosis not present

## 2020-01-16 DIAGNOSIS — I10 Essential (primary) hypertension: Secondary | ICD-10-CM | POA: Diagnosis not present

## 2020-01-16 DIAGNOSIS — R519 Headache, unspecified: Secondary | ICD-10-CM | POA: Diagnosis not present

## 2020-01-16 DIAGNOSIS — R6883 Chills (without fever): Secondary | ICD-10-CM | POA: Diagnosis not present

## 2020-01-16 IMAGING — CT CT ABD-PELV W/O CM
2 of 4 series · 13 of 46 positions shown, 15 images · non-contrast
Comparison: None.

CLINICAL DATA: Fatigue.

EXAM:
CT ABDOMEN AND PELVIS WITHOUT CONTRAST
TECHNIQUE: Multidetector CT imaging of the abdomen and pelvis was performed
following the standard protocol without IV contrast.

[Series 2: routine abdomen pelvis without 5.00 br40 s3 axial · axial · non-contrast · 0.61mm/px · z∈[+1140,+1520]mm · 10 of 92 slices shown, 12 images]
[im 8/92  soft-tissue]
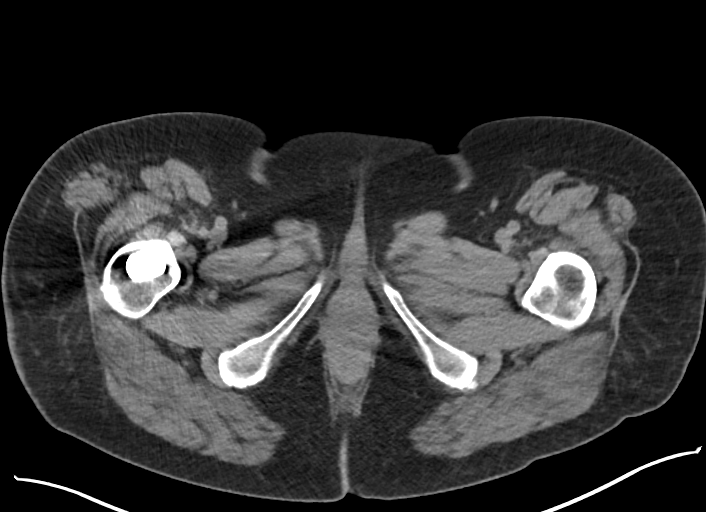
[im 8/92  bone]
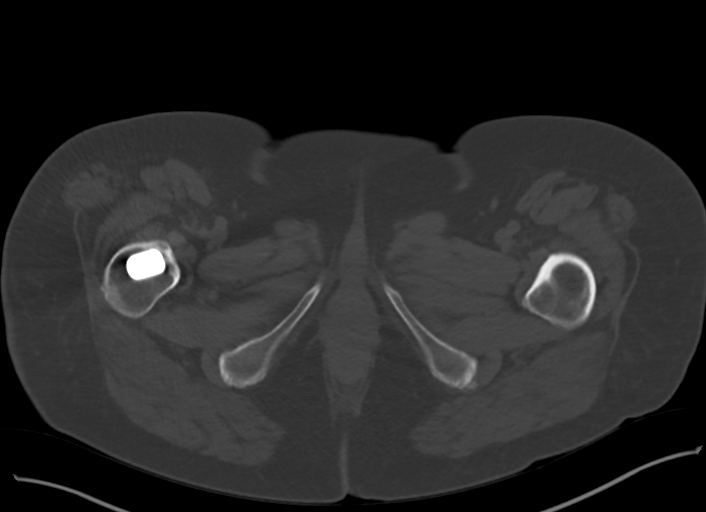
[im 16/92  soft-tissue]
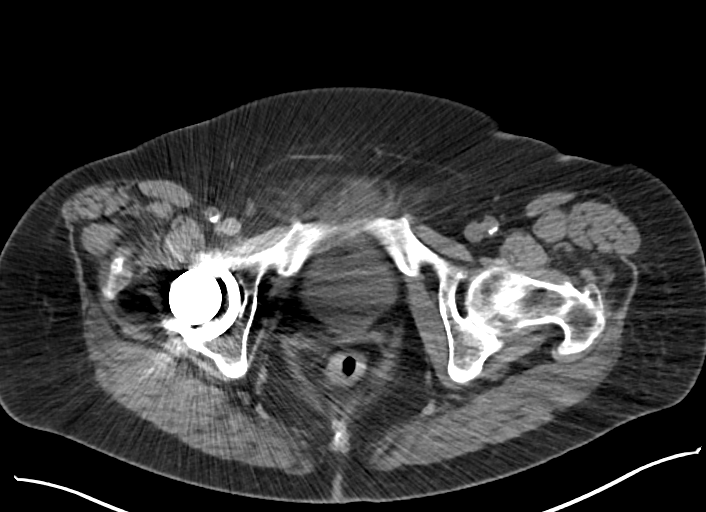
[im 23/92  soft-tissue]
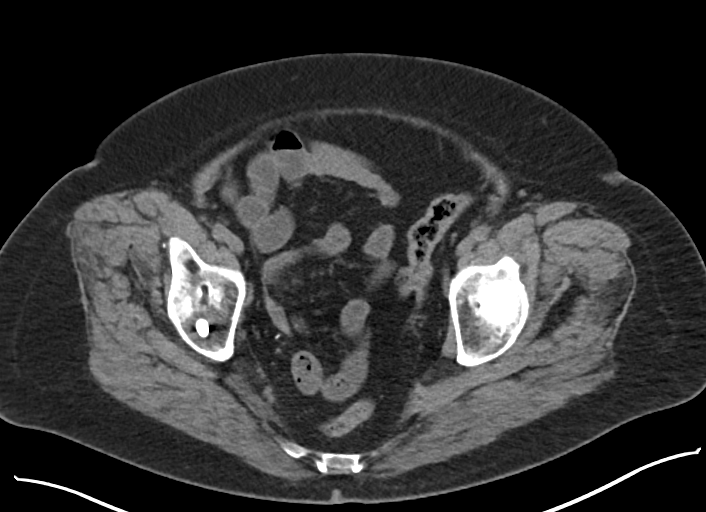
[im 35/92  soft-tissue]
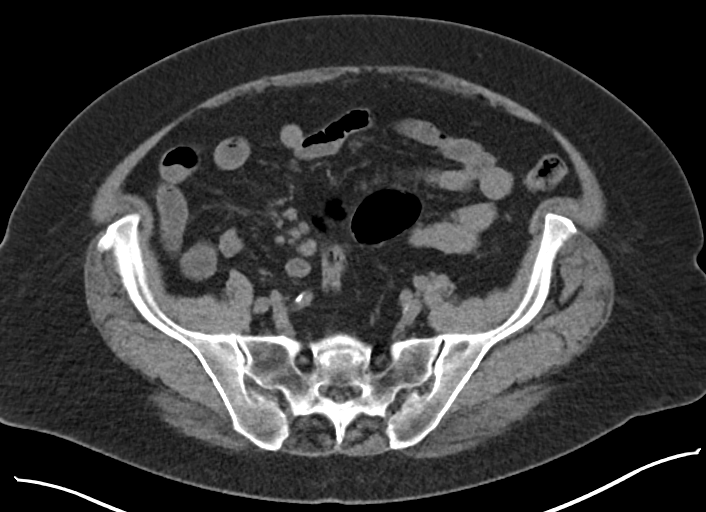
[im 42/92  soft-tissue]
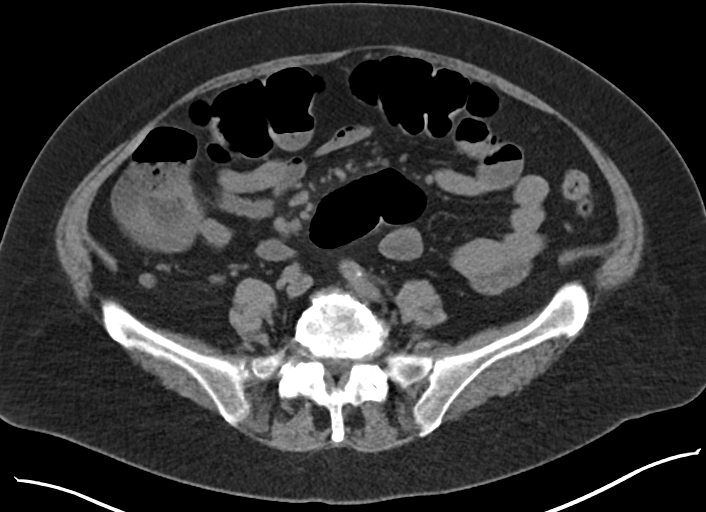
[im 50/92  soft-tissue]
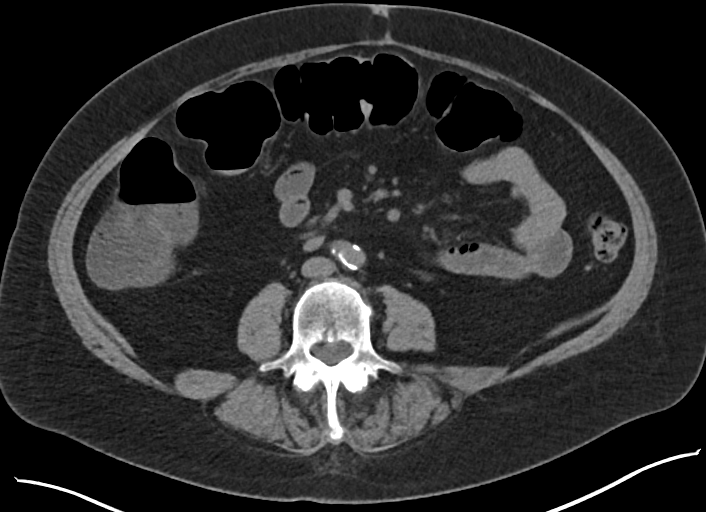
[im 57/92  soft-tissue]
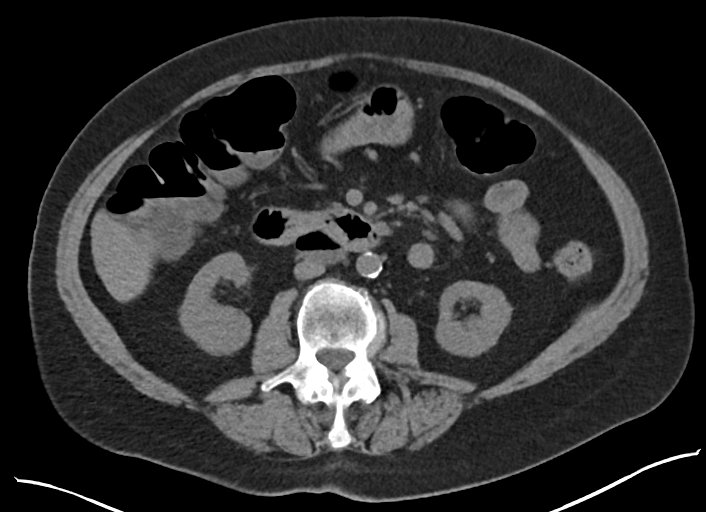
[im 69/92  soft-tissue]
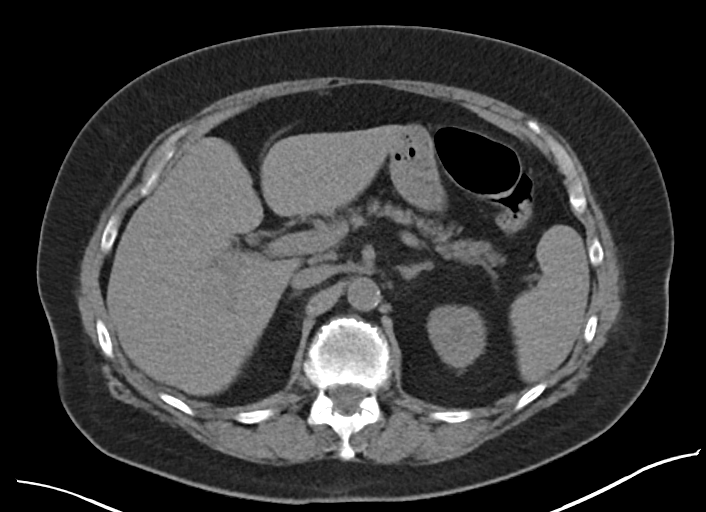
[im 76/92  soft-tissue]
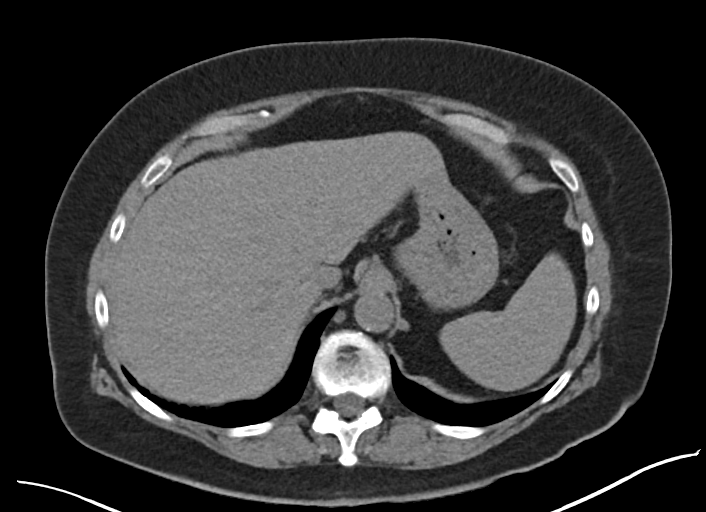
[im 76/92  bone]
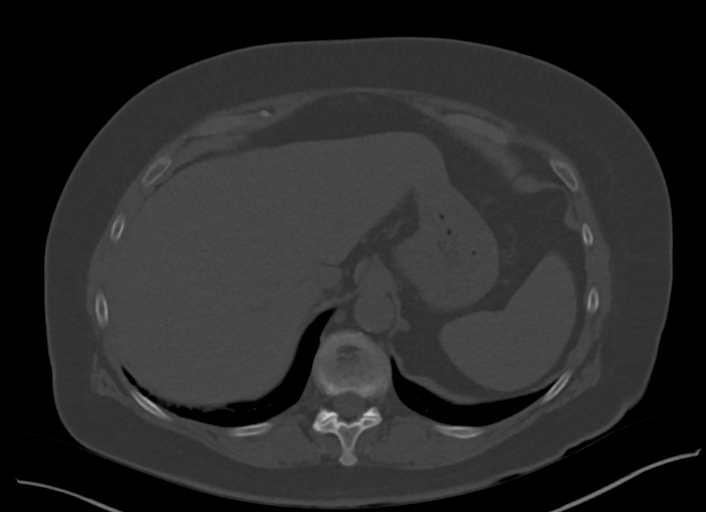
[im 84/92  soft-tissue]
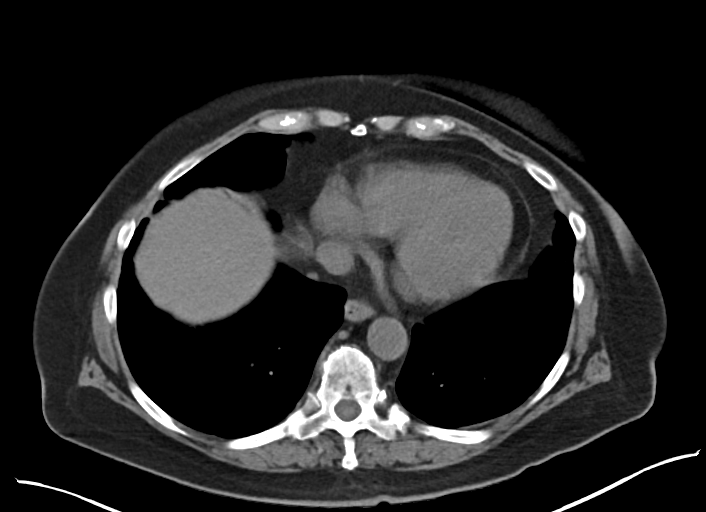

[Series 4: routine abdomen pelvis without 2.00 br40 s3 cor · coronal · non-contrast · 0.84mm/px · 3 of 155 slices shown]
[im 52/155  soft-tissue]
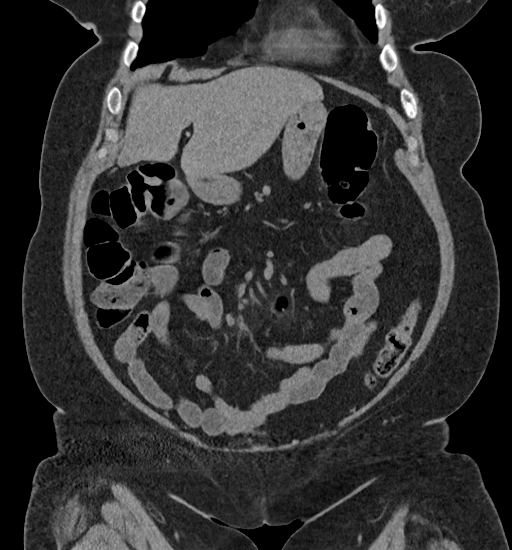
[im 69/155  soft-tissue]
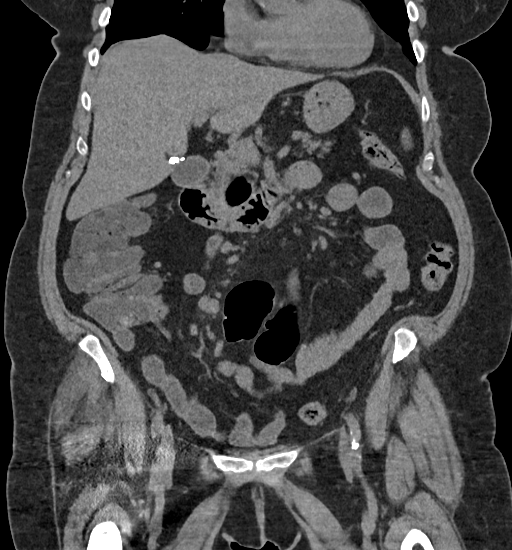
[im 86/155  soft-tissue]
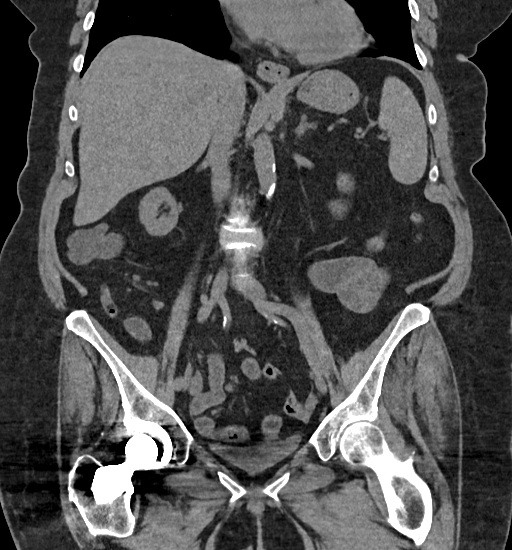

[13 of 46 positions shown; findings below may reference images not displayed]

FINDINGS: Lower chest: No acute abnormality.

Hepatobiliary: No focal liver abnormality is seen. Status post
cholecystectomy. No biliary dilatation.

Pancreas: Unremarkable. No pancreatic ductal dilatation or
surrounding inflammatory changes.

Spleen: Normal in size without focal abnormality.

Adrenals/Urinary Tract: Adrenal glands are unremarkable. Kidneys are
normal, without renal calculi, focal lesion, or hydronephrosis.
Bladder is unremarkable.

Stomach/Bowel: Stomach is within normal limits. Appendix appears
normal. No evidence of bowel wall thickening, distention, or
inflammatory changes.

Vascular/Lymphatic: Aortic atherosclerosis. No enlarged abdominal or
pelvic lymph nodes.

Reproductive: Status post hysterectomy. No adnexal masses.

Other: No abdominal wall hernia or abnormality. No abdominopelvic
ascites.

Musculoskeletal: Multilevel degenerative disc disease is noted in
the lumbar spine. No acute osseous abnormality is noted.
IMPRESSION: No acute abnormality seen in the abdomen or pelvis.

Aortic Atherosclerosis ([QE]-[QE]).

## 2020-01-16 IMAGING — CT CT HEAD W/O CM
3 of 4 series · 14 of 47 positions shown, 16 images · non-contrast
Comparison: Head CT scan [DATE].

CLINICAL DATA: Acute onset headaches and fatigue 2 weeks ago.

EXAM:
CT HEAD WITHOUT CONTRAST
TECHNIQUE: Contiguous axial images were obtained from the base of the skull
through the vertex without intravenous contrast.

[Series 2: head 5.00 hr40 s3 axial ibhc · axial · 0.34mm/px · z∈[-636,-507]mm · 8 of 32 slices shown, 10 images]
[im 3/32  brain]
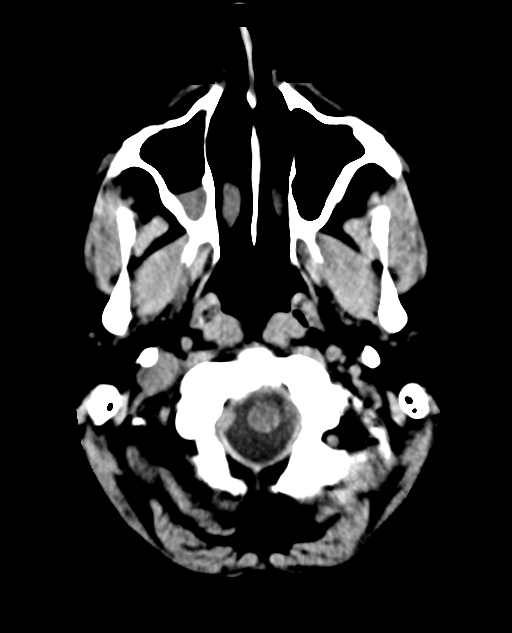
[im 3/32  bone]
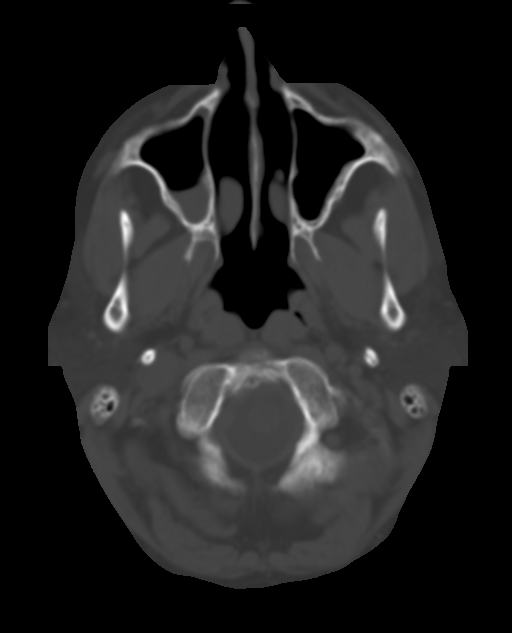
[im 7/32  brain]
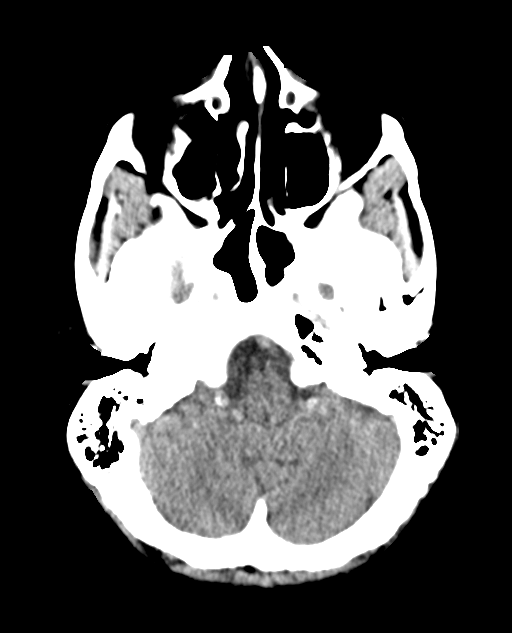
[im 12/32  brain]
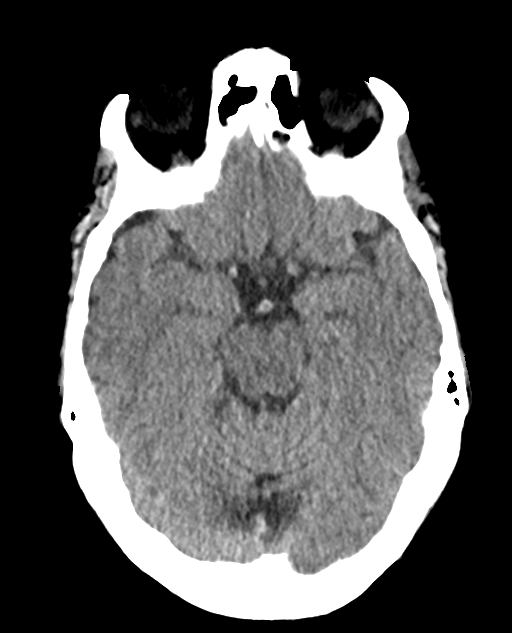
[im 14/32  brain]
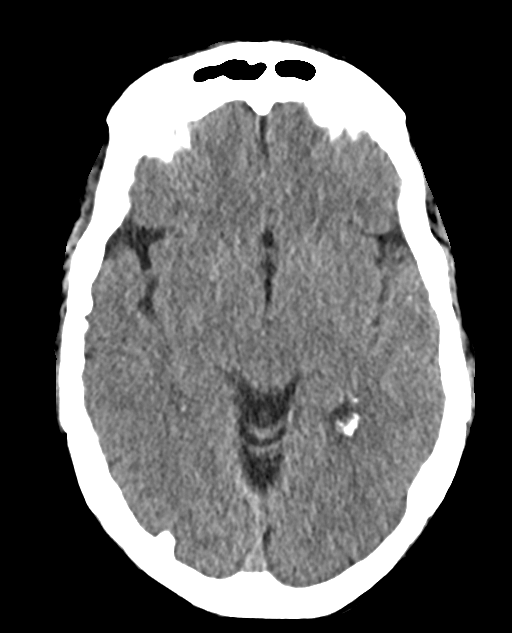
[im 18/32  brain]
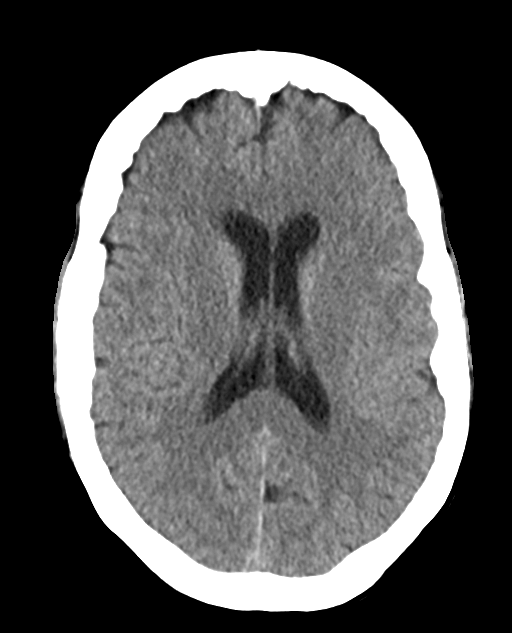
[im 18/32  bone]
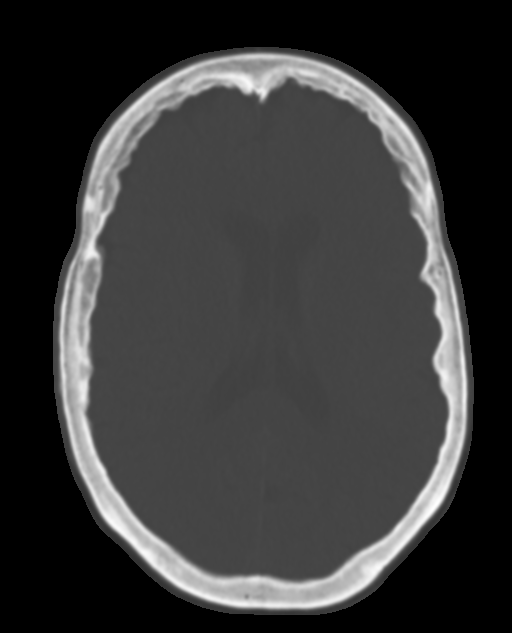
[im 20/32  brain]
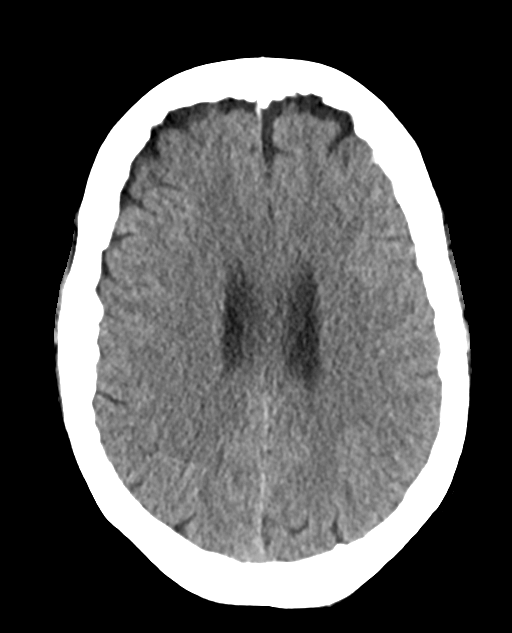
[im 25/32  brain]
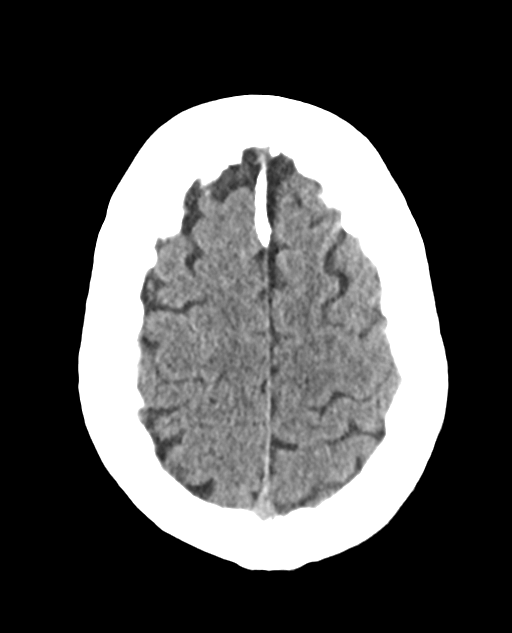
[im 29/32  brain]
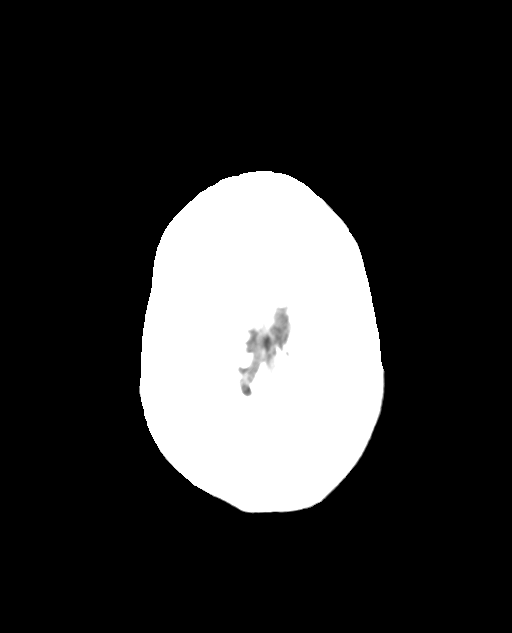

[Series 6: head 3.00 hr40 s3 sag · sagittal · 0.32mm/px · 3 of 57 slices shown]
[im 19/57  brain]
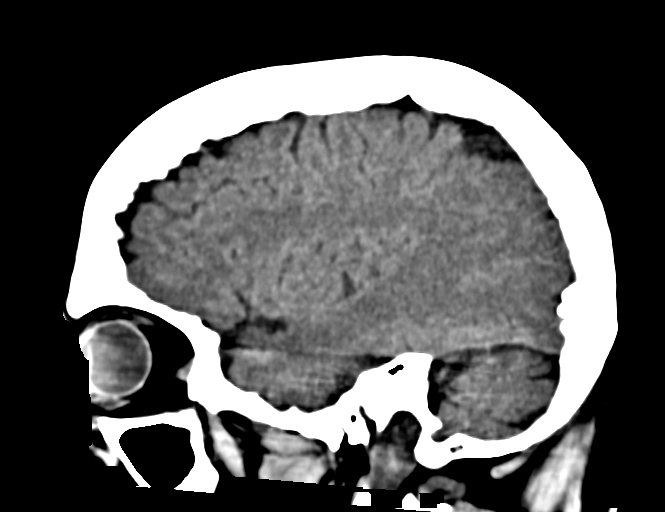
[im 29/57  brain]
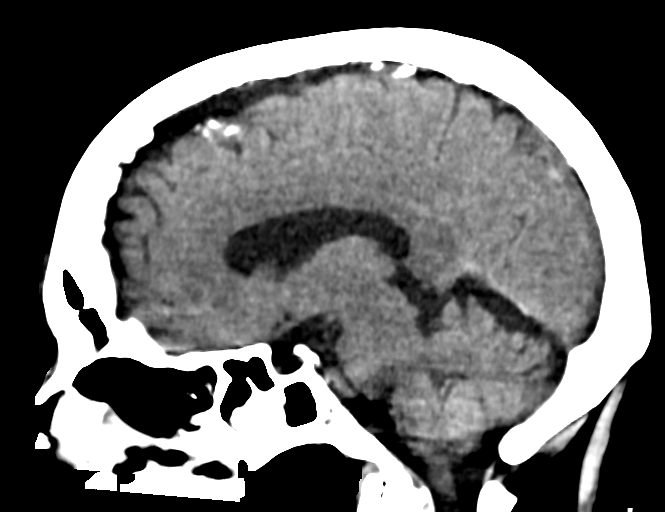
[im 38/57  brain]
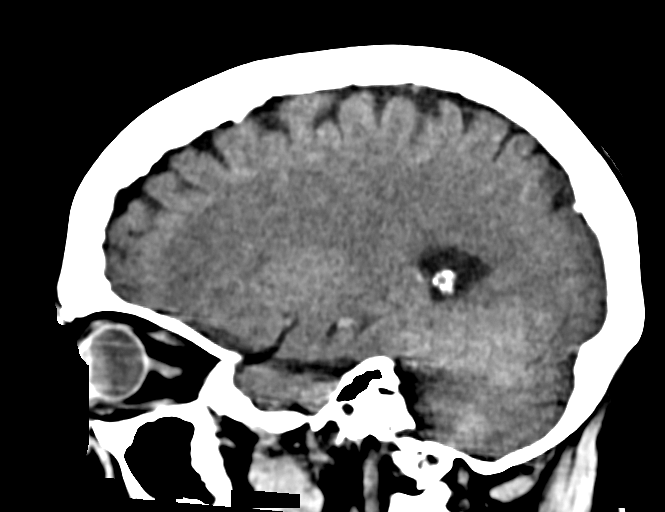

[Series 8: head 3.00 hr40 s3 cor · coronal · 0.32mm/px · 3 of 71 slices shown]
[im 24/71  brain]
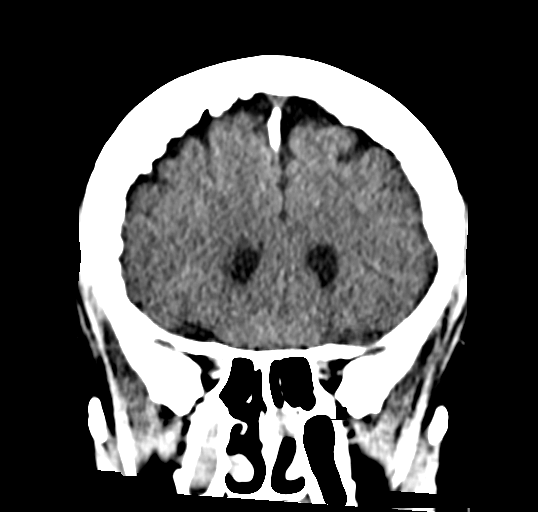
[im 32/71  brain]
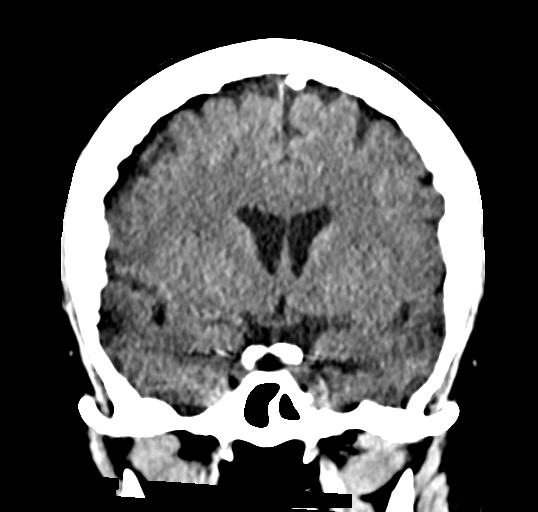
[im 39/71  brain]
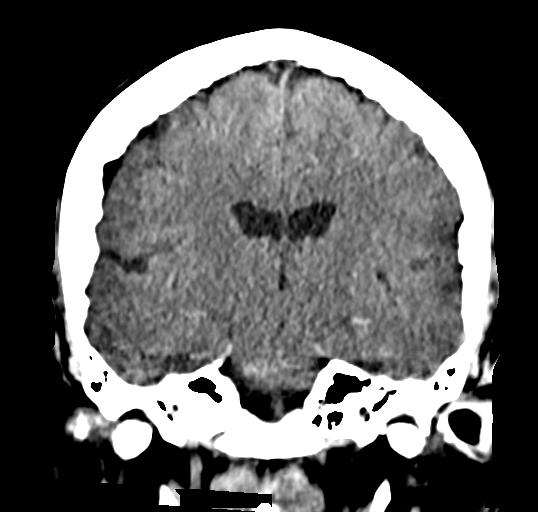

[14 of 47 positions shown; findings below may reference images not displayed]

FINDINGS: Brain: No evidence of acute infarction, hemorrhage, hydrocephalus,
extra-axial collection or mass lesion/mass effect.

Vascular: No hyperdense vessel or unexpected calcification.

Skull: Intact.  No focal lesion.

Sinuses/Orbits: The patient is status post ethmoidectomy and
bilateral maxillary antrostomy. Mild mucosal thickening right
maxillary sinus noted.

Other: None.
IMPRESSION: No acute intracranial normality.

## 2020-01-23 DIAGNOSIS — N390 Urinary tract infection, site not specified: Secondary | ICD-10-CM | POA: Diagnosis not present

## 2020-01-23 DIAGNOSIS — R519 Headache, unspecified: Secondary | ICD-10-CM | POA: Diagnosis not present

## 2020-01-23 DIAGNOSIS — R5383 Other fatigue: Secondary | ICD-10-CM | POA: Diagnosis not present

## 2020-01-23 DIAGNOSIS — R6883 Chills (without fever): Secondary | ICD-10-CM | POA: Diagnosis not present

## 2020-01-23 DIAGNOSIS — R112 Nausea with vomiting, unspecified: Secondary | ICD-10-CM | POA: Diagnosis not present

## 2020-01-24 ENCOUNTER — Inpatient Hospital Stay (HOSPITAL_COMMUNITY): Payer: Medicare Other

## 2020-01-24 ENCOUNTER — Emergency Department (HOSPITAL_COMMUNITY): Payer: Medicare Other

## 2020-01-24 ENCOUNTER — Inpatient Hospital Stay (HOSPITAL_COMMUNITY)
Admission: AD | Admit: 2020-01-24 | Discharge: 2020-01-27 | DRG: 684 | Disposition: A | Discharge status: Home Health | Payer: Medicare Other | Attending: Internal Medicine | Admitting: Internal Medicine

## 2020-01-24 ENCOUNTER — Encounter (HOSPITAL_COMMUNITY): Payer: Self-pay

## 2020-01-24 ENCOUNTER — Other Ambulatory Visit: Payer: Self-pay

## 2020-01-24 DIAGNOSIS — R197 Diarrhea, unspecified: Secondary | ICD-10-CM | POA: Diagnosis present

## 2020-01-24 DIAGNOSIS — I959 Hypotension, unspecified: Secondary | ICD-10-CM | POA: Diagnosis not present

## 2020-01-24 DIAGNOSIS — I10 Essential (primary) hypertension: Secondary | ICD-10-CM | POA: Diagnosis not present

## 2020-01-24 DIAGNOSIS — N1831 Chronic kidney disease, stage 3a: Secondary | ICD-10-CM | POA: Diagnosis present

## 2020-01-24 DIAGNOSIS — E785 Hyperlipidemia, unspecified: Secondary | ICD-10-CM | POA: Diagnosis not present

## 2020-01-24 DIAGNOSIS — E861 Hypovolemia: Secondary | ICD-10-CM | POA: Diagnosis present

## 2020-01-24 DIAGNOSIS — Z90710 Acquired absence of both cervix and uterus: Secondary | ICD-10-CM | POA: Diagnosis not present

## 2020-01-24 DIAGNOSIS — E7800 Pure hypercholesterolemia, unspecified: Secondary | ICD-10-CM | POA: Diagnosis not present

## 2020-01-24 DIAGNOSIS — Z8041 Family history of malignant neoplasm of ovary: Secondary | ICD-10-CM

## 2020-01-24 DIAGNOSIS — R140 Abdominal distension (gaseous): Secondary | ICD-10-CM

## 2020-01-24 DIAGNOSIS — Z96641 Presence of right artificial hip joint: Secondary | ICD-10-CM | POA: Diagnosis present

## 2020-01-24 DIAGNOSIS — Z79899 Other long term (current) drug therapy: Secondary | ICD-10-CM | POA: Diagnosis not present

## 2020-01-24 DIAGNOSIS — K5909 Other constipation: Secondary | ICD-10-CM | POA: Diagnosis present

## 2020-01-24 DIAGNOSIS — Z888 Allergy status to other drugs, medicaments and biological substances status: Secondary | ICD-10-CM

## 2020-01-24 DIAGNOSIS — F329 Major depressive disorder, single episode, unspecified: Secondary | ICD-10-CM | POA: Diagnosis present

## 2020-01-24 DIAGNOSIS — D631 Anemia in chronic kidney disease: Secondary | ICD-10-CM | POA: Diagnosis present

## 2020-01-24 DIAGNOSIS — R739 Hyperglycemia, unspecified: Secondary | ICD-10-CM | POA: Diagnosis not present

## 2020-01-24 DIAGNOSIS — Z86010 Personal history of colonic polyps: Secondary | ICD-10-CM

## 2020-01-24 DIAGNOSIS — N170 Acute kidney failure with tubular necrosis: Principal | ICD-10-CM | POA: Diagnosis present

## 2020-01-24 DIAGNOSIS — J45909 Unspecified asthma, uncomplicated: Secondary | ICD-10-CM | POA: Diagnosis present

## 2020-01-24 DIAGNOSIS — Z7982 Long term (current) use of aspirin: Secondary | ICD-10-CM | POA: Diagnosis not present

## 2020-01-24 DIAGNOSIS — J4520 Mild intermittent asthma, uncomplicated: Secondary | ICD-10-CM | POA: Diagnosis not present

## 2020-01-24 DIAGNOSIS — Z20822 Contact with and (suspected) exposure to covid-19: Secondary | ICD-10-CM | POA: Diagnosis present

## 2020-01-24 DIAGNOSIS — Z79890 Hormone replacement therapy (postmenopausal): Secondary | ICD-10-CM

## 2020-01-24 DIAGNOSIS — N179 Acute kidney failure, unspecified: Secondary | ICD-10-CM | POA: Diagnosis not present

## 2020-01-24 DIAGNOSIS — K219 Gastro-esophageal reflux disease without esophagitis: Secondary | ICD-10-CM | POA: Diagnosis present

## 2020-01-24 DIAGNOSIS — R112 Nausea with vomiting, unspecified: Secondary | ICD-10-CM | POA: Diagnosis not present

## 2020-01-24 DIAGNOSIS — Z9049 Acquired absence of other specified parts of digestive tract: Secondary | ICD-10-CM | POA: Diagnosis not present

## 2020-01-24 DIAGNOSIS — F32A Depression, unspecified: Secondary | ICD-10-CM | POA: Diagnosis present

## 2020-01-24 DIAGNOSIS — F320 Major depressive disorder, single episode, mild: Secondary | ICD-10-CM

## 2020-01-24 DIAGNOSIS — Z885 Allergy status to narcotic agent status: Secondary | ICD-10-CM | POA: Diagnosis not present

## 2020-01-24 DIAGNOSIS — D509 Iron deficiency anemia, unspecified: Secondary | ICD-10-CM | POA: Diagnosis present

## 2020-01-24 DIAGNOSIS — E0590 Thyrotoxicosis, unspecified without thyrotoxic crisis or storm: Secondary | ICD-10-CM | POA: Diagnosis present

## 2020-01-24 DIAGNOSIS — Z85828 Personal history of other malignant neoplasm of skin: Secondary | ICD-10-CM

## 2020-01-24 DIAGNOSIS — Z87891 Personal history of nicotine dependence: Secondary | ICD-10-CM

## 2020-01-24 DIAGNOSIS — E876 Hypokalemia: Secondary | ICD-10-CM | POA: Diagnosis not present

## 2020-01-24 DIAGNOSIS — D649 Anemia, unspecified: Secondary | ICD-10-CM | POA: Diagnosis not present

## 2020-01-24 DIAGNOSIS — N183 Chronic kidney disease, stage 3 unspecified: Secondary | ICD-10-CM | POA: Diagnosis present

## 2020-01-24 DIAGNOSIS — E039 Hypothyroidism, unspecified: Secondary | ICD-10-CM | POA: Diagnosis present

## 2020-01-24 DIAGNOSIS — N1832 Chronic kidney disease, stage 3b: Secondary | ICD-10-CM | POA: Diagnosis not present

## 2020-01-24 DIAGNOSIS — N189 Chronic kidney disease, unspecified: Secondary | ICD-10-CM | POA: Diagnosis present

## 2020-01-24 DIAGNOSIS — R9431 Abnormal electrocardiogram [ECG] [EKG]: Secondary | ICD-10-CM | POA: Diagnosis present

## 2020-01-24 DIAGNOSIS — I129 Hypertensive chronic kidney disease with stage 1 through stage 4 chronic kidney disease, or unspecified chronic kidney disease: Secondary | ICD-10-CM | POA: Diagnosis present

## 2020-01-24 LAB — COMPREHENSIVE METABOLIC PANEL
ALT: 20 U/L (ref 0–44)
AST: 19 U/L (ref 15–41)
Albumin: 3 g/dL — ABNORMAL LOW (ref 3.5–5.0)
Alkaline Phosphatase: 64 U/L (ref 38–126)
Anion gap: 12 (ref 5–15)
BUN: 48 mg/dL — ABNORMAL HIGH (ref 8–23)
CO2: 20 mmol/L — ABNORMAL LOW (ref 22–32)
Calcium: 7.1 mg/dL — ABNORMAL LOW (ref 8.9–10.3)
Chloride: 105 mmol/L (ref 98–111)
Creatinine, Ser: 4.09 mg/dL — ABNORMAL HIGH (ref 0.44–1.00)
GFR calc Af Amer: 12 mL/min — ABNORMAL LOW (ref >60)
GFR calc non Af Amer: 10 mL/min — ABNORMAL LOW (ref >60)
Glucose, Bld: 112 mg/dL — ABNORMAL HIGH (ref 70–99)
Potassium: 3.6 mmol/L (ref 3.5–5.1)
Sodium: 137 mmol/L (ref 135–145)
Total Bilirubin: 0.9 mg/dL (ref 0.3–1.2)
Total Protein: 5.6 g/dL — ABNORMAL LOW (ref 6.5–8.1)

## 2020-01-24 LAB — URINALYSIS, ROUTINE W REFLEX MICROSCOPIC
Bilirubin Urine: NEGATIVE
Glucose, UA: NEGATIVE mg/dL
Hgb urine dipstick: NEGATIVE
Ketones, ur: NEGATIVE mg/dL
Leukocytes,Ua: NEGATIVE
Nitrite: NEGATIVE
Protein, ur: NEGATIVE mg/dL
Specific Gravity, Urine: 1.012 (ref 1.005–1.030)
pH: 5 (ref 5.0–8.0)

## 2020-01-24 LAB — CBC
HCT: 31.8 % — ABNORMAL LOW (ref 36.0–46.0)
Hemoglobin: 11 g/dL — ABNORMAL LOW (ref 12.0–15.0)
MCH: 32.1 pg (ref 26.0–34.0)
MCHC: 34.6 g/dL (ref 30.0–36.0)
MCV: 92.7 fL (ref 80.0–100.0)
Platelets: 244 10*3/uL (ref 150–400)
RBC: 3.43 MIL/uL — ABNORMAL LOW (ref 3.87–5.11)
RDW: 13 % (ref 11.5–15.5)
WBC: 7.9 10*3/uL (ref 4.0–10.5)
nRBC: 0 % (ref 0.0–0.2)

## 2020-01-24 LAB — IRON AND TIBC
Iron: 28 ug/dL (ref 28–170)
Saturation Ratios: 10 % — ABNORMAL LOW (ref 10.4–31.8)
TIBC: 289 ug/dL (ref 250–450)
UIBC: 261 ug/dL

## 2020-01-24 LAB — CREATININE, URINE, RANDOM: Creatinine, Urine: 141.85 mg/dL

## 2020-01-24 LAB — LIPASE, BLOOD: Lipase: 40 U/L (ref 11–51)

## 2020-01-24 LAB — FERRITIN: Ferritin: 169 ng/mL (ref 11–307)

## 2020-01-24 LAB — VITAMIN B12: Vitamin B-12: 478 pg/mL (ref 180–914)

## 2020-01-24 LAB — SARS CORONAVIRUS 2 BY RT PCR (HOSPITAL ORDER, PERFORMED IN ~~LOC~~ HOSPITAL LAB): SARS Coronavirus 2: NEGATIVE

## 2020-01-24 LAB — SODIUM, URINE, RANDOM: Sodium, Ur: <10 mmol/L

## 2020-01-24 LAB — CK: Total CK: 64 U/L (ref 38–234)

## 2020-01-24 LAB — TSH: TSH: 0.309 u[IU]/mL — ABNORMAL LOW (ref 0.350–4.500)

## 2020-01-24 IMAGING — US US RENAL
1 series · 14 of 25 positions shown · non-contrast
Comparison: CT Abdomen and Pelvis without contrast [DATE].

CLINICAL DATA: 73-year-old female with acute on chronic renal
failure.

EXAM:
RENAL / URINARY TRACT ULTRASOUND COMPLETE

[Series 1: us renal · 14 of 34 slices shown]
[im 1/34]
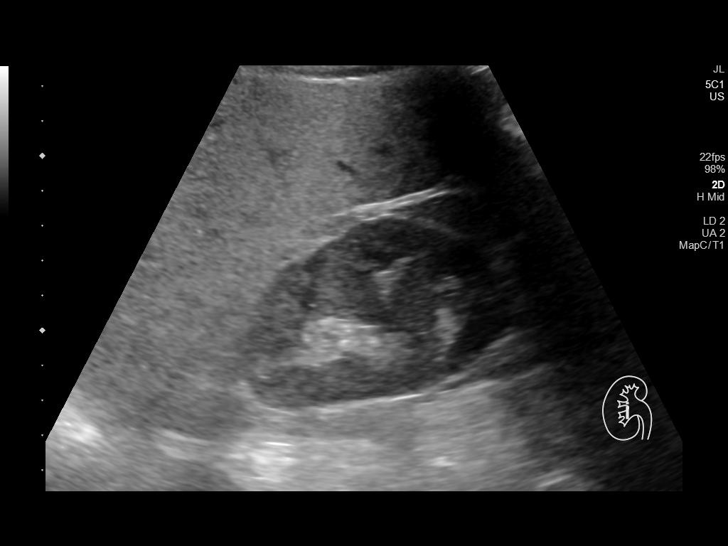
[im 3/34]
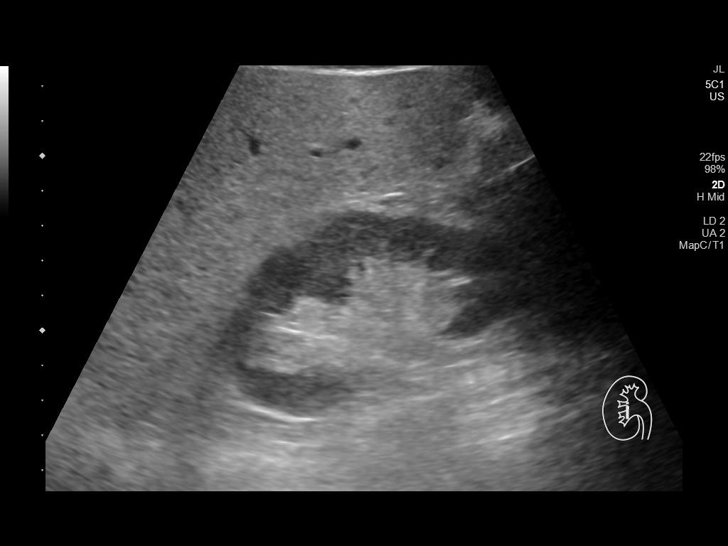
[im 6/34]
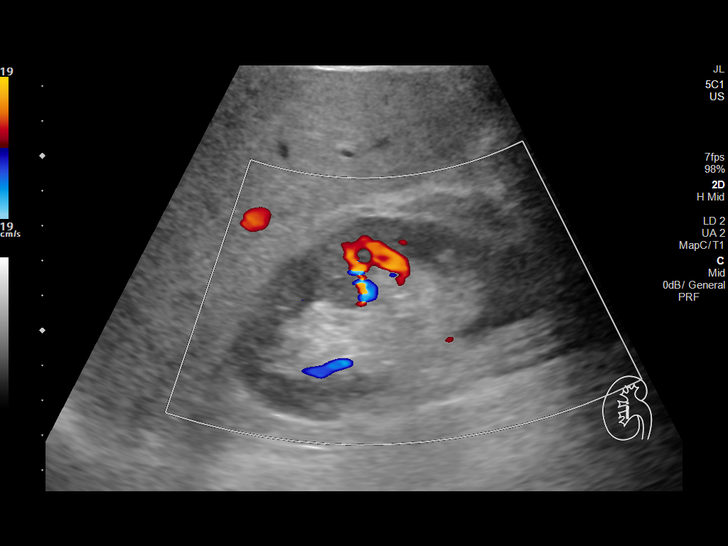
[im 9/34]
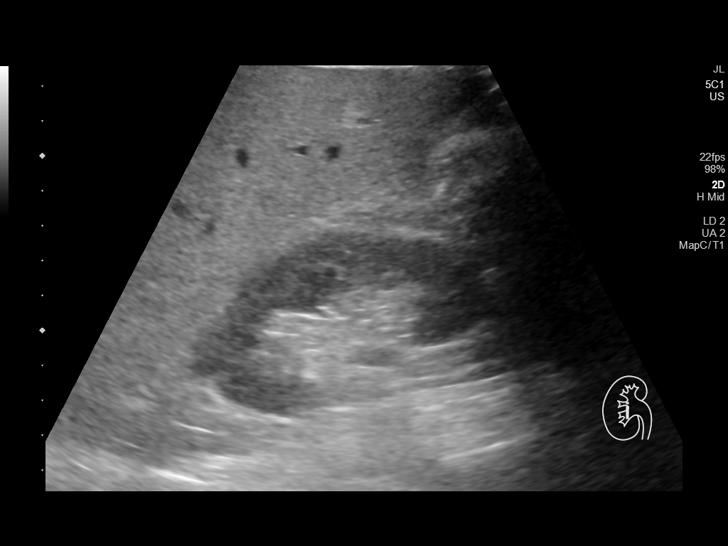
[im 12/34]
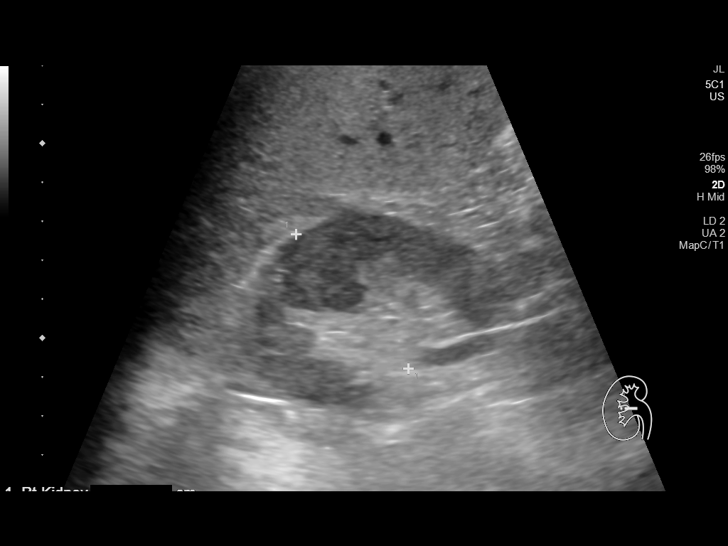
[im 13/34]
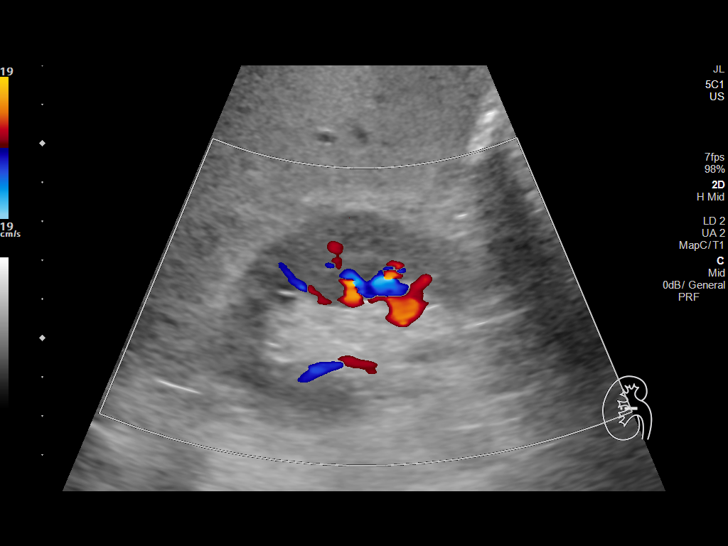
[im 16/34]
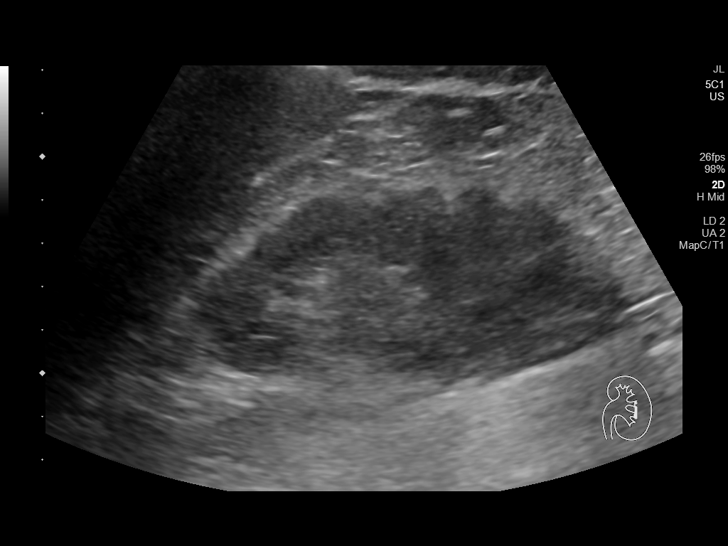
[im 18/34]
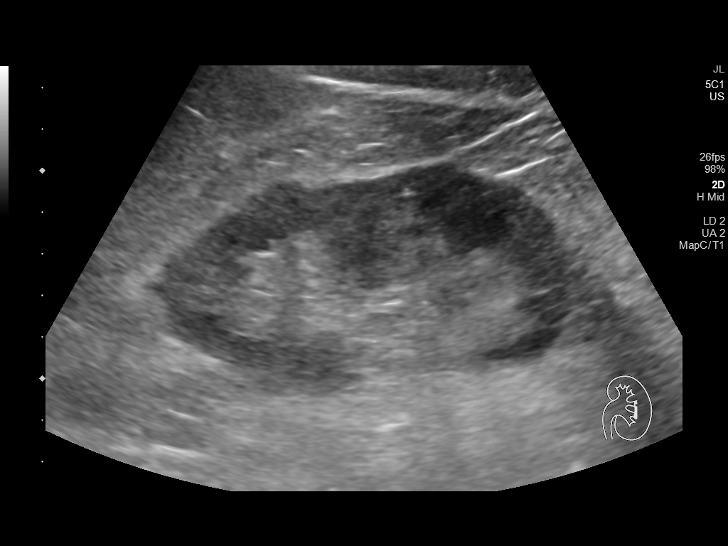
[im 21/34]
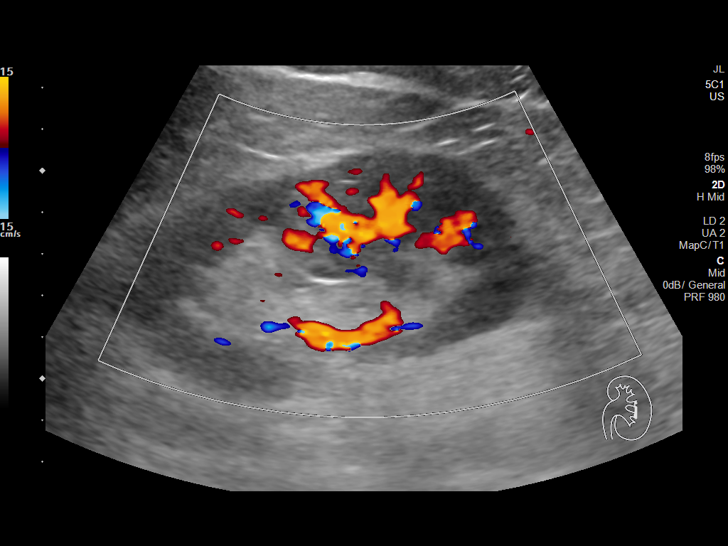
[im 23/34]
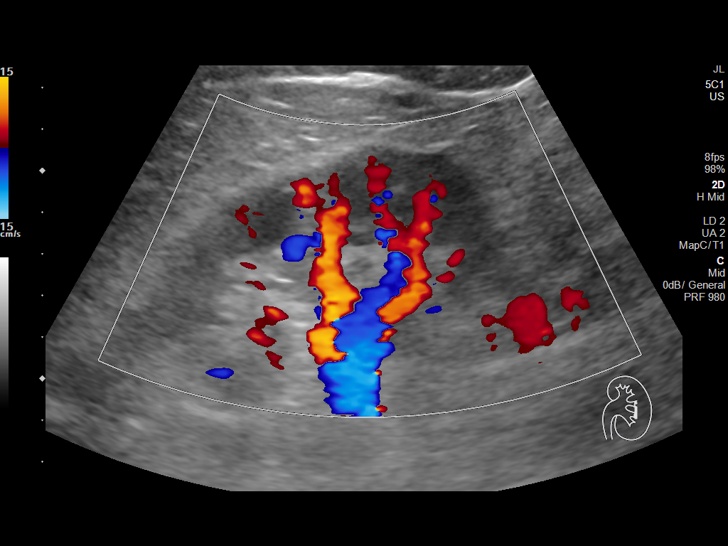
[im 25/34]
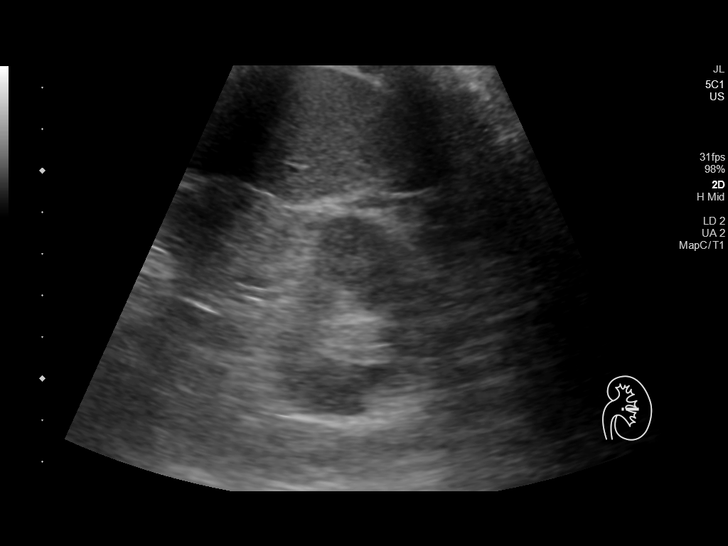
[im 28/34]
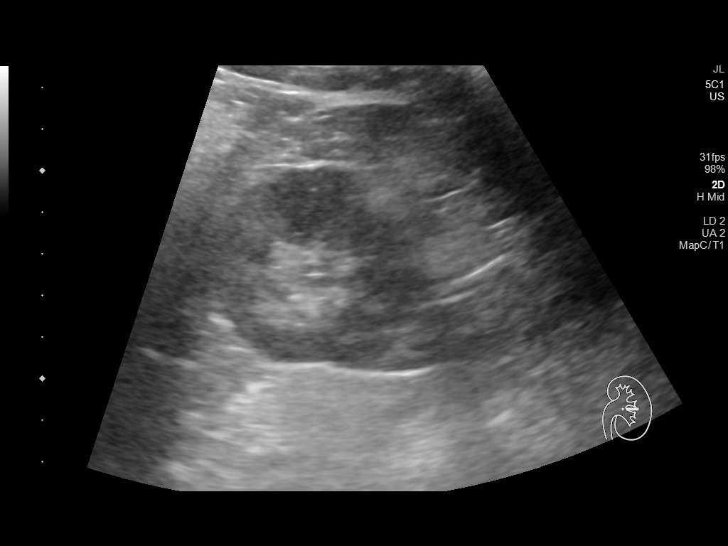
[im 31/34]
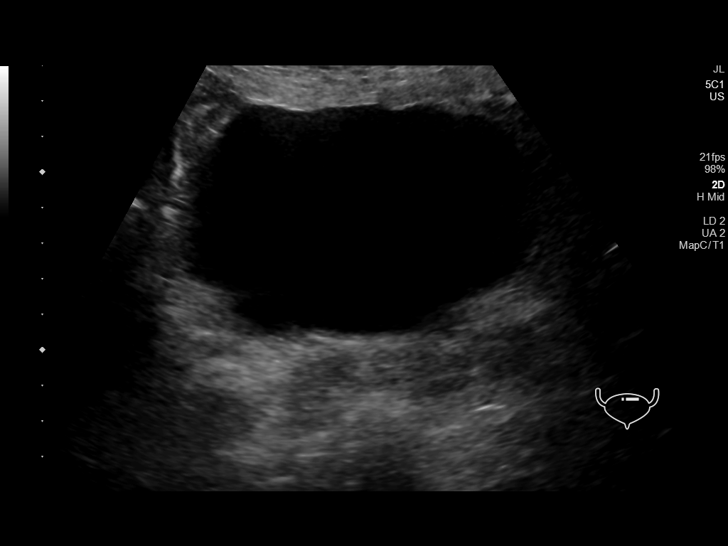
[im 34/34]
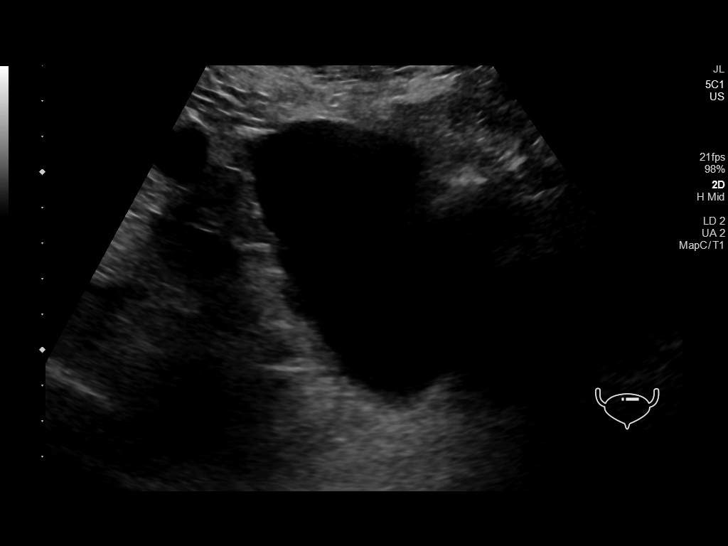

[14 of 25 positions shown; findings below may reference images not displayed]

FINDINGS: Right Kidney:

Renal measurements: 8.8 x 5.2 x 4.5 cm = volume: 107 mL .
Echogenicity within normal limits. No mass or hydronephrosis
visualized.

Left Kidney:

Renal measurements: 10 x 4.9 x 3.6 cm = volume: 91 mL. Echogenicity
within normal limits. No mass or hydronephrosis visualized.

Bladder:

Appears normal for degree of bladder distention.

Other:

Echogenic liver (image 6).
IMPRESSION: 1. Normal ultrasound appearance of both kidneys and the urinary
bladder.
2. Evidence of hepatic steatosis.

## 2020-01-24 MED ORDER — VITAMIN D 25 MCG (1000 UNIT) PO TABS
1000.0000 [IU] | ORAL_TABLET | Freq: Every day | ORAL | Status: DC
  Administered 2020-01-25 – 2020-01-27 (×3): 1000 [IU] via ORAL
  Filled 2020-01-24 (×3): qty 1

## 2020-01-24 MED ORDER — ONDANSETRON HCL 4 MG/2ML IJ SOLN
INTRAMUSCULAR | Status: AC
  Administered 2020-01-24: 4 mg via INTRAVENOUS
  Filled 2020-01-24: qty 2

## 2020-01-24 MED ORDER — ALBUTEROL SULFATE (2.5 MG/3ML) 0.083% IN NEBU: 3.0000 mL | INHALATION_SOLUTION | Freq: Four times a day (QID) | RESPIRATORY_TRACT | Status: DC | PRN

## 2020-01-24 MED ORDER — CALCIUM GLUCONATE-NACL 2-0.675 GM/100ML-% IV SOLN
2.0000 g | Freq: Once | INTRAVENOUS | Status: AC
  Administered 2020-01-24: 2000 mg via INTRAVENOUS
  Filled 2020-01-24: qty 100

## 2020-01-24 MED ORDER — AMITRIPTYLINE HCL 25 MG PO TABS
75.0000 mg | ORAL_TABLET | Freq: Every day | ORAL | Status: DC
  Administered 2020-01-24 – 2020-01-26 (×3): 75 mg via ORAL
  Filled 2020-01-24 (×3): qty 3

## 2020-01-24 MED ORDER — SODIUM CHLORIDE 0.9 % IV SOLN: Freq: Once | INTRAVENOUS | Status: AC

## 2020-01-24 MED ORDER — ONDANSETRON HCL 4 MG/2ML IJ SOLN
4.0000 mg | Freq: Four times a day (QID) | INTRAMUSCULAR | Status: DC | PRN
  Administered 2020-01-25 (×2): 4 mg via INTRAVENOUS
  Filled 2020-01-24 (×3): qty 2

## 2020-01-24 MED ORDER — ATORVASTATIN CALCIUM 40 MG PO TABS
40.0000 mg | ORAL_TABLET | Freq: Every day | ORAL | Status: DC
  Administered 2020-01-24 – 2020-01-27 (×4): 40 mg via ORAL
  Filled 2020-01-24 (×5): qty 1

## 2020-01-24 MED ORDER — PANTOPRAZOLE SODIUM 40 MG PO TBEC
40.0000 mg | DELAYED_RELEASE_TABLET | ORAL | Status: AC
  Administered 2020-01-24: 40 mg via ORAL
  Filled 2020-01-24: qty 1

## 2020-01-24 MED ORDER — ACETAMINOPHEN 650 MG RE SUPP: 650.0000 mg | Freq: Four times a day (QID) | RECTAL | Status: DC | PRN

## 2020-01-24 MED ORDER — SODIUM CHLORIDE 0.9% FLUSH
3.0000 mL | Freq: Once | INTRAVENOUS | Status: AC
  Administered 2020-01-24: 3 mL via INTRAVENOUS

## 2020-01-24 MED ORDER — HEPARIN SODIUM (PORCINE) 5000 UNIT/ML IJ SOLN
5000.0000 [IU] | Freq: Three times a day (TID) | INTRAMUSCULAR | Status: DC
  Administered 2020-01-24 – 2020-01-26 (×4): 5000 [IU] via SUBCUTANEOUS
  Filled 2020-01-24 (×4): qty 1

## 2020-01-24 MED ORDER — CEFTRIAXONE SODIUM 1 G IJ SOLR: 1.0000 g | Freq: Once | INTRAMUSCULAR | Status: DC

## 2020-01-24 MED ORDER — ACETAMINOPHEN 325 MG PO TABS
650.0000 mg | ORAL_TABLET | Freq: Four times a day (QID) | ORAL | Status: DC | PRN
  Administered 2020-01-24: 650 mg via ORAL
  Filled 2020-01-24: qty 2

## 2020-01-24 MED ORDER — SODIUM CHLORIDE 0.9 % IV BOLUS
1000.0000 mL | Freq: Once | INTRAVENOUS | Status: AC
  Administered 2020-01-24: 1000 mL via INTRAVENOUS

## 2020-01-24 MED ORDER — MOMETASONE FURO-FORMOTEROL FUM 200-5 MCG/ACT IN AERO
2.0000 | INHALATION_SPRAY | Freq: Two times a day (BID) | RESPIRATORY_TRACT | Status: DC
  Filled 2020-01-24: qty 8.8

## 2020-01-24 MED ORDER — SODIUM CHLORIDE 0.9 % IV SOLN: INTRAVENOUS | Status: DC

## 2020-01-24 MED ORDER — LEVOTHYROXINE SODIUM 50 MCG PO TABS
50.0000 ug | ORAL_TABLET | Freq: Every day | ORAL | Status: DC
  Administered 2020-01-25 – 2020-01-27 (×3): 50 ug via ORAL
  Filled 2020-01-24 (×3): qty 1

## 2020-01-24 MED ORDER — ASPIRIN EC 81 MG PO TBEC
81.0000 mg | DELAYED_RELEASE_TABLET | Freq: Every day | ORAL | Status: DC
  Administered 2020-01-24 – 2020-01-27 (×4): 81 mg via ORAL
  Filled 2020-01-24 (×4): qty 1

## 2020-01-24 MED ORDER — PANTOPRAZOLE SODIUM 40 MG PO TBEC
40.0000 mg | DELAYED_RELEASE_TABLET | Freq: Every day | ORAL | Status: DC
  Administered 2020-01-25 – 2020-01-27 (×3): 40 mg via ORAL
  Filled 2020-01-24 (×3): qty 1

## 2020-01-24 MED ORDER — SODIUM CHLORIDE 0.9 % IV SOLN: 1.0000 g | Freq: Once | INTRAVENOUS | Status: DC

## 2020-01-24 MED ORDER — METOCLOPRAMIDE HCL 5 MG/ML IJ SOLN
5.0000 mg | Freq: Once | INTRAMUSCULAR | Status: AC
  Administered 2020-01-24: 5 mg via INTRAVENOUS
  Filled 2020-01-24: qty 2

## 2020-01-24 NOTE — ED Triage Notes (Signed)
Pt presents with c/o vomiting. Pt reports she was recently diagnosed with a UTI and placed on Cipro. Pt reports she was vomiting a week ago but that subsided. Pt reports she started vomiting again this morning and her MD reports he believes it to be a reaction to the Cipro and sent her here for IV antibiotics.

## 2020-01-24 NOTE — ED Provider Notes (Addendum)
Medical screening examination/treatment/procedure(s) were conducted as a shared visit with non-physician practitioner(s) and myself.  I personally evaluated the patient during the encounter. Briefly, the patient is a 74 y.o. female is a 74 year old female with history of high cholesterol, CKD, reflux who presents to the ED with emesis.  Patient with unremarkable vitals.  No fever.  Had frequent episodes of diarrhea over the weekend.  Has had poor p.o. intake during that time.  Started to develop some nausea and vomiting this morning.  Has felt constipated.  Denies any black stools or bloody stools.  Was started on Cipro yesterday for possible UTI.  Denies any specific urinary symptoms.  Clinically she appears dehydrated.  I have seen her after lab work is back and creatinine is above 4.  She states that her baseline is around 1.5.  Suspect that she has volume depletion causing AKI today.  She has not been on recent antibiotics.  Have low suspicion for C. difficile.  Urinalysis is negative for infection today.  No significant anemia, electrolyte abnormality, leukocytosis otherwise.  Anticipate admission for hydration and trending of kidney function as this appears to be a big change from her baseline.  She did have a recent CT scan with diarrhea first began that was unremarkable.  No abdominal tenderness now.  Will admit to medicine.  This chart was dictated using voice recognition software.  Despite best efforts to proofread,  errors can occur which can change the documentation meaning.    EKG Interpretation None           Lennice Sites, DO 01/24/20 1418    Lennice Sites, DO 01/24/20 1436

## 2020-01-24 NOTE — Consult Note (Signed)
Reason for Consult:AKI/CKD stage 3a Referring Physician: Donnel Saxon, DO  Cassidy Garcia is an 74 y.o. female has a PMH significant for longstanding HTN, GERD, HLD, hypothyroidism, diverticulosis, DJD, and CKD stage 3a (baseline Scr 1.1) who presented to [facility] ED with a e week history of malaise and fatigue associated with intermittent episodes of nausea, vomiting, and watery diarrhea.   She has ongoing anorexia and admits to poor po intake. She also reports that she was diagnosed with an E Coli UTI and placed on cipro on 01/22/20 but after 2 doses she developed nausea and vomiting this am.  Her last bout of diarrhea was 01/20/20 and hasn't had a bowel movement since.  She has passed flatus.  She did have a CT scan of abdomen and pelvis on 01/16/20 which was unremarkable.  In the ED she was noted to be hypotensive at 94/69 and tachycardic at 103.  She was afebrile and O2 sat 99% on room air.  Labs were notable for a BUN/Cr of 48/4.09, CO2 20, K 3.6, lipase 40, albumin 3, normal LFT's, Hgb 11, WBC 7.9, and glucose of 112.  UA was negative for blood or protein and Urine Na was <10.  We were consulted to further evaluate her AKI/CKD stage 3a.  The trend in Scr is seen below.  She denies any fevers, chills, abdominal pain, SOB, cough, hematochezia, melena, or BRBPR.  Of note, she has been taking Hyzaar and diclofenac during the past 3 weeks.  Trend in Creatinine: Creatinine, Ser  Date/Time Value Ref Range Status  01/24/2020 11:03 AM 4.09 (H) 0.44 - 1.00 mg/dL Final  01/16/2020  (at [facility]) 1.6 (H)    09/03/2019  (at [facility]) 1.1 (H)    02/16/2015 04:34 AM 1.10 (H) 0.44 - 1.00 mg/dL Final  05/22/2012 01:10 PM 0.90 0.50 - 1.10 mg/dL Final  12/11/2011 04:52 AM 0.97 0.50 - 1.10 mg/dL Final  12/07/2011 01:55 PM 1.45 (H) 0.50 - 1.10 mg/dL Final    PMH:   Past Medical History:  Diagnosis Date  . Arthritis   . Asthma   . Bronchitis 03/2016  . Chronic kidney disease    stage 3  . Colon polyps    tubular adenoma  .  Constipation   . Depression   . Diverticulosis   . GERD (gastroesophageal reflux disease)   . Hemorrhoids   . Hyperlipidemia   . Hypertension   . Hypothyroidism   . Shortness of breath    with exertion on occasion  . Sinusitis   . Skin cancer     PSH:   Past Surgical History:  Procedure Laterality Date  . ABDOMINAL HYSTERECTOMY    . CHOLECYSTECTOMY    . COLONOSCOPY  02/12/2011  . FEET SURGERY    . HAND SURGERY    . NASAL SINUS SURGERY    . POLYPECTOMY    . TOTAL HIP ARTHROPLASTY  12/10/2011   Procedure: TOTAL HIP ARTHROPLASTY ANTERIOR APPROACH;  Surgeon: Mcarthur Rossetti, MD;  Location: [facility] ORS;  Service: Orthopedics;  Laterality: Right;  Right Total Hip Arthroplasty, Anterior Approach    Allergies:  Allergies  Allergen Reactions  . Codeine Nausea And Vomiting    REACTION: Lightheaded, nauses  . Demerol [Meperidine Hcl] Other (See Comments)    Dizziness   . Phenergan [Promethazine] Other (See Comments)    Feeling like heart attack    Medications:   Prior to Admission medications   Medication Sig Start Date End Date Taking? Authorizing Provider  acetaminophen (TYLENOL) 500  MG tablet Take 1,000 mg by mouth every 6 (six) hours as needed for moderate pain.   Yes [provider]  albuterol (VENTOLIN HFA) 108 (90 BASE) MCG/ACT inhaler Inhale 2 puffs into the lungs every 6 (six) hours as needed for wheezing or shortness of breath.    Yes [provider]  amitriptyline (ELAVIL) 25 MG tablet Take 75 mg by mouth at bedtime.  01/22/11  Yes [provider]  aspirin 81 MG tablet Take 81 mg by mouth daily with breakfast.    Yes [provider]  atorvastatin (LIPITOR) 40 MG tablet Take 40 mg by mouth daily.   Yes [provider]  cholecalciferol (VITAMIN D3) 25 MCG (1000 UNIT) tablet Take 1,000 Units by mouth daily.   Yes [provider]  ciprofloxacin (CIPRO) 500 MG tablet Take 500 mg by mouth 2 (two) times daily. Start date :  01/23/20 01/23/20  Yes [provider]  diclofenac (VOLTAREN) 75 MG EC tablet Take 1 tablet (75 mg total) by mouth 2 (two) times daily. 12/02/16  Yes Leandrew Koyanagi, MD  Fluticasone-Salmeterol [facility] INHUB) 250-50 MCG/DOSE AEPB Inhale 1 puff into the lungs 2 (two) times daily.   Yes [provider]  levothyroxine (SYNTHROID, LEVOTHROID) 50 MCG tablet Take 50 mcg by mouth daily with breakfast.    Yes [provider]  losartan-hydrochlorothiazide (HYZAAR) 100-25 MG per tablet Take 1 tablet by mouth daily with breakfast.  07/02/11  Yes [provider]  mometasone (NASONEX) 50 MCG/ACT nasal spray Place 2 sprays into the nose daily as needed (allergy). FOR ALLERGIES   Yes [provider]  Multiple Vitamin (MULTIVITAMIN) tablet Take 1 tablet by mouth daily.   Yes [provider]  Omega-3 Fatty Acids (FISH OIL) 1200 MG CAPS Take 1 capsule by mouth 2 (two) times daily.    Yes [provider]  omeprazole (PRILOSEC) 20 MG capsule Take 20 mg by mouth daily with breakfast.    Yes [provider]  ondansetron (ZOFRAN-ODT) 8 MG disintegrating tablet Take 8 mg by mouth 3 (three) times daily. 01/18/20  Yes [provider]  traMADol (ULTRAM) 50 MG tablet Take 50 mg by mouth every 6 (six) hours as needed for moderate pain.   Yes [provider]  verapamil (CALAN-SR) 240 MG CR tablet Take 240 mg by mouth at bedtime.    Yes [provider]  methylPREDNISolone (MEDROL) 4 MG tablet Take as directed Patient not taking: Reported on 01/24/2020 09/20/18   Pete Pelt, PA-C  oxyCODONE-acetaminophen (PERCOCET/ROXICET) 5-325 MG tablet Take 1-2 tablets by mouth every 4 (four) hours as needed for severe pain. Patient not taking: Reported on 01/24/2020 10/03/18   Pete Pelt, PA-C    Inpatient medications: . sodium chloride flush  3 mL Intravenous Once    Discontinued Meds:   Medications Discontinued During This Encounter   Medication Reason  . cefTRIAXone (ROCEPHIN) 1 g in sodium chloride 0.9 % 100 mL IVPB   . glucosamine-chondroitin 500-400 MG tablet Patient Preference  . nabumetone (RELAFEN) 750 MG tablet Patient Preference  . ADVAIR DISKUS 100-50 MCG/DOSE AEPB Patient Preference    Social History:  reports that she quit smoking about 18 years ago. She has never used smokeless tobacco. She reports that she does not drink alcohol and does not use drugs.  Family History:   Family History  Problem Relation Age of Onset  . Ovarian cancer Mother   . Colon cancer Neg Hx  Pertinent items are noted in HPI. Weight change:  No intake or output data in the 24 hours ending 01/24/20 1708 BP (!) 118/58   Pulse 87   Temp 98.2 F (36.8 C) (Oral)   Resp (!) 26   SpO2 98%  Vitals:   01/24/20 1101 01/24/20 1325 01/24/20 1332 01/24/20 1547  BP: 113/66 94/69 (!) 108/57 (!) 118/58  Pulse: (!) 103 93  87  Resp: 18 18  (!) 26  Temp: 98.2 F (36.8 C)     TempSrc: Oral     SpO2: 99% 100%  98%     General appearance: alert, cooperative, fatigued and no distress Head: Normocephalic, without obvious abnormality, atraumatic Eyes: negative findings: lids and lashes normal, conjunctivae and sclerae normal and corneas clear Neck: no adenopathy, no carotid bruit, no JVD, supple, symmetrical, trachea midline and thyroid not enlarged, symmetric, no tenderness/mass/nodules Resp: clear to auscultation bilaterally Cardio: regular rate and rhythm, S1, S2 normal, no murmur, click, rub or gallop GI: soft, non-tender; bowel sounds normal; no masses,  no organomegaly Extremities: extremities normal, atraumatic, no cyanosis or edema  Labs: Basic Metabolic Panel: Recent Labs  Lab 01/24/20 1103  NA 137  K 3.6  CL 105  CO2 20*  GLUCOSE 112*  BUN 48*  CREATININE 4.09*  ALBUMIN 3.0*  CALCIUM 7.1*   Liver Function Tests: Recent Labs  Lab 01/24/20 1103  AST 19  ALT 20  ALKPHOS 64  BILITOT 0.9  PROT 5.6*  ALBUMIN  3.0*   Recent Labs  Lab 01/24/20 1103  LIPASE 40   No results for input(s): AMMONIA in the last 168 hours. CBC: Recent Labs  Lab 01/24/20 1103  WBC 7.9  HGB 11.0*  HCT 31.8*  MCV 92.7  PLT 244   PT/INR: @LABRCNTIP (inr:5) Cardiac Enzymes: )No results for input(s): CKTOTAL, CKMB, CKMBINDEX, TROPONINI in the last 168 hours. CBG: No results for input(s): GLUCAP in the last 168 hours.  Iron Studies: No results for input(s): IRON, TIBC, TRANSFERRIN, FERRITIN in the last 168 hours.  Xrays/Other Studies: No results found.   Assessment/Plan: 1.  AKI- presumably volume depletion/ischemic ATN in setting of N/V/D, poor po intake, and hypotension with concomitant use of an ARB and NSAIDs.  CT without obstruction on 01/16/20.  1. Hold Hyzaar and other BP meds due to hypotension 2. Continue with IVF's 3. Continue to follow UOP and daily Scr 4. Discontinue diclofenac 5. No indication for dialysis and hopefully will respond to volume expansion and holding nephrotoxic agents.  She has produced some urine in the ED. 2. Intermittent N/V/D- with mildly elevated lipase.  Unclear etiology.  GI workup per primary svc. 3. Hypovolemic hypotension- improved with IVF's 4.  hyperglycemia- follow 5. HTN- now with low bp.  Hold meds as above 6. HLD- takes a statin, will check CK level but no myalgias or hematuria. 7. Normocytic Anemia- unclear etiology.  Check iron stores and B12 levels.  Guaiac stools and follow h/h.    Governor Rooks Seanpaul Preece 01/24/2020, 5:08 PM

## 2020-01-24 NOTE — ED Provider Notes (Addendum)
[facility] DEPT Provider Note   CSN: [id number] Arrival date & time: 01/24/20  1048     History Chief Complaint  Patient presents with  . Emesis    Cassidy Garcia is a 74 y.o. female with pertinent past medical history of diverticulosis, GERD, chronic kidney disease presents to the ER for evaluation of generalized malaise for about 3 weeks.  Associated with intermittent bouts of nausea, vomiting, non bloody non melenotic diarrhea, subjective fevers, sweaty/chills, extreme fatigue, headaches.  While in the ER noticed gradual onset mild back pain in the middle/flank area that she thinks may be from sitting down for too long, worse with position changes.  States for the past week she has to lean against the wall during walking because she gets very tired.  Denies any exertional chest pain, shortness of breath, palpitations, lightheadedness or syncope but states walking just feels like "all the cells in my body are tired".  Patient went to an urgent care initially where they gave her Zofran.  She then followed up with PCP NP May 26 who got screening labs and told her everything looks normal.  She followed up with his PCP MD on 6/8 who got more labs and added a urinalysis.  States she was told her urine showed blood and protein.  PCP called in ciprofloxacin.  She took 2 doses of this, last dose this morning along with Zofran.  This morning around 4 AM she had an episode of dark green and brown emesis.  She tested negative for Covid about 10 days ago.  Has felt a little constipated, states she wants to prove but cannot.  Unsure if she is had a bowel movement since Sunday.  Has been using a bland diet without improvement.  Her sister [place] come down from [place] to help her.  Patient states she has been told that she has early onset kidney disease but is not sure what her normal creatinine is. No modifying factors.   Denies frank fever.  Denies cough, sore throat, rhinorrhea.  No  anterior abdominal pain.  No dysuria, hematuria or frequency. HPI     Past Medical History:  Diagnosis Date  . Arthritis   . Asthma   . Bronchitis 03/2016  . Chronic kidney disease    stage 3  . Colon polyps    tubular adenoma  . Constipation   . Depression   . Diverticulosis   . GERD (gastroesophageal reflux disease)   . Hemorrhoids   . Hyperlipidemia   . Hypertension   . Hypothyroidism   . Shortness of breath    with exertion on occasion  . Sinusitis   . Skin cancer     Patient Active Problem List   Diagnosis Date Noted  . Pain in right hip 06/29/2017  . Trochanteric bursitis, right hip 06/29/2017  . 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 Great toe pain, left 12/04/2016  . Chronic right shoulder pain 12/04/2016  . Degenerative arthritis of hip 12/10/2011  . Chest pain 07/06/2011  . HTN (hypertension) 07/06/2011  . Hyperlipidemia 07/06/2011    Past Surgical History:  Procedure Laterality Date  . ABDOMINAL HYSTERECTOMY    . CHOLECYSTECTOMY    . COLONOSCOPY  02/12/2011  . FEET SURGERY    . HAND SURGERY    . NASAL SINUS SURGERY    . POLYPECTOMY    . TOTAL HIP ARTHROPLASTY  12/10/2011   Procedure: TOTAL HIP ARTHROPLASTY ANTERIOR APPROACH;  Surgeon: Harrell Gave  Kerry Fort, MD;  Location: [facility];  Service: Orthopedics;  Laterality: Right;  Right Total Hip Arthroplasty, Anterior Approach     OB History   No obstetric history on file.     Family History  Problem Relation Age of Onset  . Ovarian cancer Mother   . Colon cancer Neg Hx     Social History   Tobacco Use  . Smoking status: Former Smoker    Quit date: 08/16/2001    Years since quitting: 18.4  . Smokeless tobacco: Never Used  Substance Use Topics  . Alcohol use: No    Comment: rare  . Drug use: No    Home Medications Prior to Admission medications   Medication Sig Start Date End Date Taking? Authorizing Provider  ADVAIR DISKUS 100-50 MCG/DOSE AEPB Inhale 1 puff into the  lungs daily.  02/14/15   [provider]  albuterol (VENTOLIN HFA) 108 (90 BASE) MCG/ACT inhaler Inhale 2 puffs into the lungs every 6 (six) hours as needed. FOR SHORTNESS OF BREATH    [provider]  amitriptyline (ELAVIL) 25 MG tablet Take 75 mg by mouth at bedtime.  01/22/11   [provider]  aspirin 81 MG tablet Take 81 mg by mouth daily with breakfast.     [provider]  atorvastatin (LIPITOR) 40 MG tablet Take 40 mg by mouth daily.    [provider]  diclofenac (VOLTAREN) 75 MG EC tablet Take 1 tablet (75 mg total) by mouth 2 (two) times daily. 12/02/16   Leandrew Koyanagi, MD  glucosamine-chondroitin 500-400 MG tablet Take 1 tablet by mouth 2 (two) times daily.     [provider]  levothyroxine (SYNTHROID, LEVOTHROID) 50 MCG tablet Take 50 mcg by mouth daily with breakfast.     [provider]  losartan-hydrochlorothiazide (HYZAAR) 100-25 MG per tablet Take 1 tablet by mouth daily with breakfast.  07/02/11   [provider]  methylPREDNISolone (MEDROL) 4 MG tablet Take as directed 09/20/18   Pete Pelt, PA-C  mometasone (NASONEX) 50 MCG/ACT nasal spray Place 2 sprays into the nose daily as needed. FOR ALLERGIES    [provider]  Multiple Vitamin (MULTIVITAMIN) tablet Take 1 tablet by mouth daily.    [provider]  nabumetone (RELAFEN) 750 MG tablet Take 750 mg by mouth 2 (two) times daily.    [provider]  Omega-3 Fatty Acids (FISH OIL) 1200 MG CAPS Take 1 capsule by mouth 2 (two) times daily.     [provider]  omeprazole (PRILOSEC) 20 MG capsule Take 20 mg by mouth daily with breakfast.     [provider]  oxyCODONE-acetaminophen (PERCOCET/ROXICET) 5-325 MG tablet Take 1-2 tablets by mouth every 4 (four) hours as needed for severe pain. 10/03/18   Pete Pelt, PA-C  verapamil (CALAN-SR) 240 MG CR tablet Take 240 mg by mouth at bedtime.     [provider]    Allergies    Codeine and Demerol [meperidine hcl]  Review of Systems   Review of Systems  Constitutional: Positive for fatigue.  Gastrointestinal: Positive for diarrhea, nausea and vomiting.  Musculoskeletal: Positive for back pain.  All other systems reviewed and are negative.   Physical Exam Updated Vital Signs BP (!) 108/57   Pulse 93   Temp 98.2 F (36.8 C) (Oral)   Resp 18   SpO2 100%   Physical Exam Vitals and nursing note reviewed.  Constitutional:      Appearance:  She is well-developed.     Comments: Non toxic in NAD, appears tired   HENT:     Head: Normocephalic and atraumatic.     Nose: Nose normal.  Eyes:     Conjunctiva/sclera: Conjunctivae normal.  Cardiovascular:     Rate and Rhythm: Normal rate and regular rhythm.  Pulmonary:     Effort: Pulmonary effort is normal.     Breath sounds: Normal breath sounds.  Abdominal:     General: Bowel sounds are normal.     Palpations: Abdomen is soft.     Tenderness: There is no abdominal tenderness. There is left CVA tenderness.     Comments: No G/R/R. No suprapubic tenderness. Negative Murphy's and McBurney's. Active BS to lower quadrants.   Musculoskeletal:        General: Normal range of motion.     Cervical back: Normal range of motion.  Skin:    General: Skin is warm and dry.     Capillary Refill: Capillary refill takes less than 2 seconds.  Neurological:     Mental Status: She is alert.  Psychiatric:        Behavior: Behavior normal.     ED Results / Procedures / Treatments   Labs (all labs ordered are listed, but only abnormal results are displayed) Labs Reviewed  COMPREHENSIVE METABOLIC PANEL - Abnormal; Notable for the following components:      Result Value   CO2 20 (*)    Glucose, Bld 112 (*)    BUN 48 (*)    Creatinine, Ser 4.09 (*)    Calcium 7.1 (*)    Total Protein 5.6 (*)    Albumin 3.0 (*)    GFR calc non Af Amer 10 (*)    GFR calc Af Amer 12 (*)    All other components  within normal limits  CBC - Abnormal; Notable for the following components:   RBC 3.43 (*)    Hemoglobin 11.0 (*)    HCT 31.8 (*)    All other components within normal limits  URINE CULTURE  GASTROINTESTINAL PANEL BY PCR, STOOL (REPLACES STOOL CULTURE)  SARS CORONAVIRUS 2 BY RT PCR (HOSPITAL ORDER, [place] [facility])  LIPASE, BLOOD  URINALYSIS, ROUTINE W REFLEX MICROSCOPIC    EKG None  Radiology No results found.  Procedures .Critical Care Performed by: Kinnie Feil, PA-C Authorized by: Kinnie Feil, PA-C   Critical care provider statement:    Critical care time (minutes):  45   Critical care was necessary to treat or prevent imminent or life-threatening deterioration of the following conditions:  Renal failure   Critical care was time spent personally by me on the following activities:  Discussions with consultants, evaluation of patient's response to treatment, examination of patient, ordering and performing treatments and interventions, ordering and review of laboratory studies, ordering and review of radiographic studies, pulse oximetry, re-evaluation of patient's condition, obtaining history from patient or surrogate and review of old charts   I assumed direction of critical care for this patient from another provider in my specialty: no     (including critical care time)  Medications Ordered in ED Medications  sodium chloride flush (NS) 0.9 % injection 3 mL (has no administration in time range)  0.9 %  sodium chloride infusion (has no administration in time range)  sodium chloride 0.9 % bolus 1,000 mL (1,000 mLs Intravenous New Bag/Given 01/24/20 1449)  metoCLOPramide (REGLAN) injection 5 mg (5 mg Intravenous Given 01/24/20 1448)  ED Course  I have reviewed the triage vital signs and the nursing notes.  Pertinent labs & imaging results that were available during my care of the patient were reviewed by me and considered in my medical  decision making (see chart for details).  Clinical Course as of Jan 23 1510  Thu Jan 24, 2020  1302 BUN(!): 48 [CG]  1302 Creatinine(!): 4.09 [CG]  1302 GFR, Est Non African American(!): 10 [CG]  1302 Hemoglobin(!): 11.0 [CG]  1403 BP(!): 108/57 [CG]  1403 Pulse Rate(!): 103 [CG]  1403 Temp: 98.2 F (36.8 C) [CG]  1403 Resp: 18 [CG]  1403 SpO2: 99 % [CG]  1403 WBC: 7.9 [CG]    Clinical Course User Index [CG] Kinnie Feil, PA-C   MDM Rules/Calculators/A&P                          This patient complains of generalized malaise, vomiting, diarrhea, exertional weakness, headaches. New onset left flank pain while in ER.    Previous medical records available, nursing notes reviewed to obtain more history and assist with MDM  Chief complain involves an extensive number of treatment options and is a complaint that carries with it a high risk of complications and morbidity and mortality. The differential diagnosis includes diverticulitis, viral gastroenteritis leading to electrolyte abnormalities.  Denies associated chest pain, palpitations,  SOB on exertion. No melena or history of GIB.  I ordered medication including reglan, IVF.   I ordered laboratory studies including screening labs, stool PCR, urinalysis.   Patient pulled up her last medical records and creatinine last month it was around 1.3. PCP obtained CTAP on 6/2 which was personally reviewed, non acute.   1430: I ordered, reviewed and personally visualized and interpreted the above labs and/or imaging studies.  ER work up reveals acute kidney renal failure, uremia, mild anemia. UA negative. Patient has only had 2 doses of ciprofloxacin. EKG shows prolonged QTC 505.   Critical interventions include IVF for acute renal failure, admission.   Will consult medicine team for admission for acute renal failure likely related to GI losses.  Consider renal ultrasound in patient. Stool PCR and covid test pending.   1545: Spoke to  Dr Sherral Hammers who has accepted patient, requesting nephrology consult for admission [facility] vs [facility].  1548: Spoke to renal Dr Marval Regal who states admission to [facility], he will come see patient.  Avoid nephrotoxins, IVF.  Final Clinical Impression(s) / ED Diagnoses Final diagnoses:  Acute renal failure, unspecified acute renal failure type [facility])    Rx / DC Orders ED Discharge Orders    None       Kinnie Feil, PA-C 01/24/20 [place], Adam, DO 01/25/20 1213

## 2020-01-24 NOTE — H&P (Signed)
[facility] History and Physical  Cassidy Garcia DTO:[id number] DOB: 04-Aug-1946 DOA: 01/24/2020  Referring physician:  PCP: Burnard Bunting, MD   Chief Complaint: Weakness, fatigue, presyncope,  HPI: Cassidy Garcia is a 74 y.o. WF PMHx Depression, Asthma, diverticulosis, HTN, HLD, GERD, CKD stage III, (last creatinine 2016 1.1).  presents to the ER for evaluation of generalized malaise for about 3 weeks.  Associated with intermittent bouts of nausea, vomiting, non bloody non melenotic diarrhea, subjective fevers, sweaty/chills, extreme fatigue, headaches.  While in the ER noticed gradual onset mild back pain in the middle/flank area that she thinks may be from sitting down for too long, worse with position changes.  States for the past week she has to lean against the wall during walking because she gets very tired.  Denies any exertional chest pain, shortness of breath, palpitations, lightheadedness or syncope but states walking just feels like "all the cells in my body are tired".  Patient went to an urgent care initially where they gave her Zofran.  She then followed up with PCP NP May 26 who got screening labs and told her everything looks normal.  She followed up with his PCP MD on 6/8 who got more labs and added a urinalysis.  States she was told her urine showed blood and protein.  PCP called in ciprofloxacin.  She took 2 doses of this, last dose this morning along with Zofran.  This morning around 4 AM she had an episode of dark green and brown emesis.  She tested negative for Covid about 10 days ago.  Has felt a little constipated, states she wants to prove but cannot.  Unsure if she is had a bowel movement since Sunday.  Has been using a bland diet without improvement.  Her sister [place] come down from [place] to help her.  Patient states she has been told that she has early onset kidney disease but is not sure what her normal creatinine is. No modifying factors.   Denies frank fever.  Denies  cough, sore throat, rhinorrhea.  No anterior abdominal pain.  No dysuria, hematuria or frequency. -States has not had diarrhea since Sunday.   Review of Systems:  Covid vaccination; positive vaccination  Constitutional:  No weight loss, night sweats, Fevers, chills, fatigue.  HEENT:  No headaches, Difficulty swallowing,Tooth/dental problems,Sore throat,  No sneezing, itching, ear ache, nasal congestion, post nasal drip,  Cardio-vascular:  No chest pain, Orthopnea, PND, swelling in lower extremities, anasarca, dizziness, palpitations  GI:  No heartburn, indigestion, abdominal pain, nausea, vomiting, diarrhea, change in bowel habits, loss of appetite  Resp:  No shortness of breath with exertion or at rest. No excess mucus, no productive cough, No non-productive cough, No coughing up of blood.No change in color of mucus.No wheezing.No chest wall deformity  Skin:  no rash or lesions.  GU:  no dysuria, change in color of urine, no urgency or frequency. No flank pain.  Musculoskeletal:  No joint pain or swelling. No decreased range of motion. No back pain.  Psych:  No change in mood or affect. No depression or anxiety. No memory loss.   Past Medical History:  Diagnosis Date  . Arthritis   . Asthma   . Bronchitis 03/2016  . Chronic kidney disease    stage 3  . Colon polyps    tubular adenoma  . Constipation   . Depression   . Diverticulosis   . GERD (gastroesophageal reflux disease)   . Hemorrhoids   . Hyperlipidemia   .  Hypertension   . Hypothyroidism   . Shortness of breath    with exertion on occasion  . Sinusitis   . Skin cancer    Past Surgical History:  Procedure Laterality Date  . ABDOMINAL HYSTERECTOMY    . CHOLECYSTECTOMY    . COLONOSCOPY  02/12/2011  . FEET SURGERY    . HAND SURGERY    . NASAL SINUS SURGERY    . POLYPECTOMY    . TOTAL HIP ARTHROPLASTY  12/10/2011   Procedure: TOTAL HIP ARTHROPLASTY ANTERIOR APPROACH;  Surgeon: Mcarthur Rossetti, MD;   Location: [facility];  Service: Orthopedics;  Laterality: Right;  Right Total Hip Arthroplasty, Anterior Approach   Social History:  reports that she quit smoking about 18 years ago. She has never used smokeless tobacco. She reports that she does not drink alcohol and does not use drugs.  Allergies  Allergen Reactions  . Codeine Nausea And Vomiting    REACTION: Lightheaded, nauses  . Demerol [Meperidine Hcl] Other (See Comments)    Dizziness   . Phenergan [Promethazine] Other (See Comments)    Feeling like heart attack    Family History  Problem Relation Age of Onset  . Ovarian cancer Mother   . Colon cancer Neg Hx      Prior to Admission medications   Medication Sig Start Date End Date Taking? Authorizing Provider  acetaminophen (TYLENOL) 500 MG tablet Take 1,000 mg by mouth every 6 (six) hours as needed for moderate pain.   Yes [provider]  albuterol (VENTOLIN HFA) 108 (90 BASE) MCG/ACT inhaler Inhale 2 puffs into the lungs every 6 (six) hours as needed for wheezing or shortness of breath.    Yes [provider]  amitriptyline (ELAVIL) 25 MG tablet Take 75 mg by mouth at bedtime.  01/22/11  Yes [provider]  aspirin 81 MG tablet Take 81 mg by mouth daily with breakfast.    Yes [provider]  atorvastatin (LIPITOR) 40 MG tablet Take 40 mg by mouth daily.   Yes [provider]  cholecalciferol (VITAMIN D3) 25 MCG (1000 UNIT) tablet Take 1,000 Units by mouth daily.   Yes [provider]  ciprofloxacin (CIPRO) 500 MG tablet Take 500 mg by mouth 2 (two) times daily. Start date : 01/23/20 01/23/20  Yes [provider]  diclofenac (VOLTAREN) 75 MG EC tablet Take 1 tablet (75 mg total) by mouth 2 (two) times daily. 12/02/16  Yes Leandrew Koyanagi, MD  Fluticasone-Salmeterol [facility] INHUB) 250-50 MCG/DOSE AEPB Inhale 1 puff into the lungs 2 (two) times daily.   Yes [provider]  levothyroxine (SYNTHROID, LEVOTHROID) 50 MCG  tablet Take 50 mcg by mouth daily with breakfast.    Yes [provider]  losartan-hydrochlorothiazide (HYZAAR) 100-25 MG per tablet Take 1 tablet by mouth daily with breakfast.  07/02/11  Yes [provider]  mometasone (NASONEX) 50 MCG/ACT nasal spray Place 2 sprays into the nose daily as needed (allergy). FOR ALLERGIES   Yes [provider]  Multiple Vitamin (MULTIVITAMIN) tablet Take 1 tablet by mouth daily.   Yes [provider]  Omega-3 Fatty Acids (FISH OIL) 1200 MG CAPS Take 1 capsule by mouth 2 (two) times daily.    Yes [provider]  omeprazole (PRILOSEC) 20 MG capsule Take 20 mg by mouth daily with breakfast.    Yes [provider]  ondansetron (ZOFRAN-ODT) 8 MG disintegrating tablet Take 8 mg by mouth 3 (three) times daily. 01/18/20  Yes  [provider]  traMADol (ULTRAM) 50 MG tablet Take 50 mg by mouth every 6 (six) hours as needed for moderate pain.   Yes [provider]  verapamil (CALAN-SR) 240 MG CR tablet Take 240 mg by mouth at bedtime.    Yes [provider]  methylPREDNISolone (MEDROL) 4 MG tablet Take as directed Patient not taking: Reported on 01/24/2020 09/20/18   Pete Pelt, PA-C  oxyCODONE-acetaminophen (PERCOCET/ROXICET) 5-325 MG tablet Take 1-2 tablets by mouth every 4 (four) hours as needed for severe pain. Patient not taking: Reported on 01/24/2020 10/03/18   Pete Pelt, PA-C     Consultants:  Nephrology  Procedures/Significant Events:  6/10 renal ultrasound; normal    I have personally reviewed and interpreted all radiology studies and my findings are as above.   VENTILATOR SETTINGS:    Cultures 6/10 SARS coronavirus negative   Antimicrobials:    Devices    LINES / TUBES:      Continuous Infusions: . sodium chloride    . sodium chloride      Physical Exam: Vitals:   01/24/20 1101 01/24/20 1325 01/24/20 1332 01/24/20 1547  BP: 113/66 94/69 (!)  108/57 (!) 118/58  Pulse: (!) 103 93  87  Resp: 18 18  (!) 26  Temp: 98.2 F (36.8 C)     TempSrc: Oral     SpO2: 99% 100%  98%    Wt Readings from Last 3 Encounters:  08/19/16 88.5 kg  08/02/16 96.6 kg  12/01/12 88.5 kg    General: A/O x4, no acute respiratory distress Eyes: negative scleral hemorrhage, negative anisocoria, negative icterus ENT: Negative Runny nose, negative gingival bleeding, Neck:  Negative scars, masses, torticollis, lymphadenopathy, JVD Lungs: Clear to auscultation bilaterally without wheezes or crackles Cardiovascular: Regular rate and rhythm without murmur gallop or rub normal S1 and S2 Abdomen: OBESE, negative abdominal pain, nondistended, positive soft, bowel sounds, no rebound, no ascites, no appreciable mass Extremities: No significant cyanosis, clubbing, or edema bilateral lower extremities Skin: Negative rashes, lesions, ulcers Psychiatric:  Negative depression, negative anxiety, negative fatigue, negative mania  Central nervous system:  Cranial nerves II through XII intact, tongue/uvula midline, all extremities muscle strength 5/5, sensation intact throughout, negative dysarthria, negative expressive aphasia, negative receptive aphasia.        Labs on Admission:  Basic Metabolic Panel: Recent Labs  Lab 01/24/20 1103  NA 137  K 3.6  CL 105  CO2 20*  GLUCOSE 112*  BUN 48*  CREATININE 4.09*  CALCIUM 7.1*   Liver Function Tests: Recent Labs  Lab 01/24/20 1103  AST 19  ALT 20  ALKPHOS 64  BILITOT 0.9  PROT 5.6*  ALBUMIN 3.0*   Recent Labs  Lab 01/24/20 1103  LIPASE 40   No results for input(s): AMMONIA in the last 168 hours. CBC: Recent Labs  Lab 01/24/20 1103  WBC 7.9  HGB 11.0*  HCT 31.8*  MCV 92.7  PLT 244   Cardiac Enzymes: No results for input(s): CKTOTAL, CKMB, CKMBINDEX, TROPONINI in the last 168 hours.  BNP (last 3 results) No results for input(s): BNP in the last 8760 hours.  ProBNP (last 3 results) No  results for input(s): PROBNP in the last 8760 hours.  CBG: No results for input(s): GLUCAP in the last 168 hours.  Radiological Exams on Admission: No results found.  EKG: Independently reviewed.   Assessment/Plan Active Problems:   HTN (hypertension)   Hyperlipidemia   Depression   Asthma   CKD (  chronic kidney disease), stage III  Essential HTN -Patient's BP currently controlled without medication -Hold all patient's home medication, may have been playing into her presyncope. -Orthostatic vitals every morning  HLD  -Lipid panel pending   Acute on CKD stage III -ED physician spoke to Dr. Marval Regal Nephrology who was okay with admitting to [facility] long.  Will follow along. -Strict in and out -Daily weight -Avoid all nephrotoxic medication -Renal ultrasound normal   Hypocalcemia -Corrected calcium= 7.9 -Calcium chloride IV 1 g   Code Status: Full (DVT Prophylaxis: Subcu heparin Family Communication: Sister at bedside for discussion of plan of care Status is: Inpatient    Dispo: The patient is from: Home              Anticipated d/c is to: Home              Anticipated d/c date is: 6/15              Patient currently unstable     Data Reviewed: Care during the described time interval was provided by me .  I have reviewed this patient's available data, including medical history, events of note, physical examination, and all test results as part of my evaluation.   The patient is critically ill with multiple organ systems failure and requires high complexity decision making for assessment and support, frequent evaluation and titration of therapies, application of advanced monitoring technologies and extensive interpretation of multiple databases. Critical Care Time devoted to patient care services described in this note  Time spent: 57 minutes   Caleb Prigmore, [facility] Pager [phone]

## 2020-01-24 NOTE — ED Notes (Signed)
US done, patient will be transported to room 1619.

## 2020-01-24 NOTE — ED Notes (Signed)
[place] at bedside delaying transport

## 2020-01-25 ENCOUNTER — Inpatient Hospital Stay (HOSPITAL_COMMUNITY): Payer: Medicare Other

## 2020-01-25 DIAGNOSIS — N1831 Chronic kidney disease, stage 3a: Secondary | ICD-10-CM

## 2020-01-25 LAB — LIPID PANEL
Cholesterol: 78 mg/dL (ref 0–200)
HDL: 23 mg/dL — ABNORMAL LOW (ref >40)
LDL Cholesterol: 32 mg/dL (ref 0–99)
Total CHOL/HDL Ratio: 3.4 RATIO
Triglycerides: 113 mg/dL (ref <150)
VLDL: 23 mg/dL (ref 0–40)

## 2020-01-25 LAB — RENAL FUNCTION PANEL
Albumin: 2.4 g/dL — ABNORMAL LOW (ref 3.5–5.0)
Anion gap: 12 (ref 5–15)
BUN: 48 mg/dL — ABNORMAL HIGH (ref 8–23)
CO2: 16 mmol/L — ABNORMAL LOW (ref 22–32)
Calcium: 7 mg/dL — ABNORMAL LOW (ref 8.9–10.3)
Chloride: 109 mmol/L (ref 98–111)
Creatinine, Ser: 3.37 mg/dL — ABNORMAL HIGH (ref 0.44–1.00)
GFR calc Af Amer: 15 mL/min — ABNORMAL LOW (ref >60)
GFR calc non Af Amer: 13 mL/min — ABNORMAL LOW (ref >60)
Glucose, Bld: 93 mg/dL (ref 70–99)
Phosphorus: 5.3 mg/dL — ABNORMAL HIGH (ref 2.5–4.6)
Potassium: 3.3 mmol/L — ABNORMAL LOW (ref 3.5–5.1)
Sodium: 137 mmol/L (ref 135–145)

## 2020-01-25 LAB — COMPREHENSIVE METABOLIC PANEL
ALT: 18 U/L (ref 0–44)
AST: 18 U/L (ref 15–41)
Albumin: 2.5 g/dL — ABNORMAL LOW (ref 3.5–5.0)
Alkaline Phosphatase: 58 U/L (ref 38–126)
Anion gap: 12 (ref 5–15)
BUN: 49 mg/dL — ABNORMAL HIGH (ref 8–23)
CO2: 17 mmol/L — ABNORMAL LOW (ref 22–32)
Calcium: 7 mg/dL — ABNORMAL LOW (ref 8.9–10.3)
Chloride: 105 mmol/L (ref 98–111)
Creatinine, Ser: 3.19 mg/dL — ABNORMAL HIGH (ref 0.44–1.00)
GFR calc Af Amer: 16 mL/min — ABNORMAL LOW (ref >60)
GFR calc non Af Amer: 14 mL/min — ABNORMAL LOW (ref >60)
Glucose, Bld: 98 mg/dL (ref 70–99)
Potassium: 3.2 mmol/L — ABNORMAL LOW (ref 3.5–5.1)
Sodium: 134 mmol/L — ABNORMAL LOW (ref 135–145)
Total Bilirubin: 0.5 mg/dL (ref 0.3–1.2)
Total Protein: 4.8 g/dL — ABNORMAL LOW (ref 6.5–8.1)

## 2020-01-25 LAB — CBC WITH DIFFERENTIAL/PLATELET
Abs Immature Granulocytes: 0.02 10*3/uL (ref 0.00–0.07)
Basophils Absolute: 0 10*3/uL (ref 0.0–0.1)
Basophils Relative: 0 %
Eosinophils Absolute: 0.2 10*3/uL (ref 0.0–0.5)
Eosinophils Relative: 3 %
HCT: 25.9 % — ABNORMAL LOW (ref 36.0–46.0)
Hemoglobin: 8.8 g/dL — ABNORMAL LOW (ref 12.0–15.0)
Immature Granulocytes: 0 %
Lymphocytes Relative: 11 %
Lymphs Abs: 0.7 10*3/uL (ref 0.7–4.0)
MCH: 31.7 pg (ref 26.0–34.0)
MCHC: 34 g/dL (ref 30.0–36.0)
MCV: 93.2 fL (ref 80.0–100.0)
Monocytes Absolute: 0.6 10*3/uL (ref 0.1–1.0)
Monocytes Relative: 9 %
Neutro Abs: 4.7 10*3/uL (ref 1.7–7.7)
Neutrophils Relative %: 77 %
Platelets: 184 10*3/uL (ref 150–400)
RBC: 2.78 MIL/uL — ABNORMAL LOW (ref 3.87–5.11)
RDW: 12.9 % (ref 11.5–15.5)
WBC: 6.2 10*3/uL (ref 4.0–10.5)
nRBC: 0 % (ref 0.0–0.2)

## 2020-01-25 LAB — FOLATE RBC
Folate, Hemolysate: 539 ng/mL
Folate, RBC: 1996 ng/mL (ref >498)
Hematocrit: 27 % — ABNORMAL LOW (ref 34.0–46.6)

## 2020-01-25 LAB — PHOSPHORUS: Phosphorus: 5.5 mg/dL — ABNORMAL HIGH (ref 2.5–4.6)

## 2020-01-25 LAB — URINE CULTURE: Culture: NO GROWTH

## 2020-01-25 LAB — MAGNESIUM: Magnesium: 0.7 mg/dL — CL (ref 1.7–2.4)

## 2020-01-25 IMAGING — DX DG ABDOMEN 1V
1 series · 1 of 1 positions shown · non-contrast
Comparison: Abdominal CT [DATE]

CLINICAL DATA: Abdominal distension.

EXAM:
ABDOMEN - 1 VIEW

[abdomen kub]
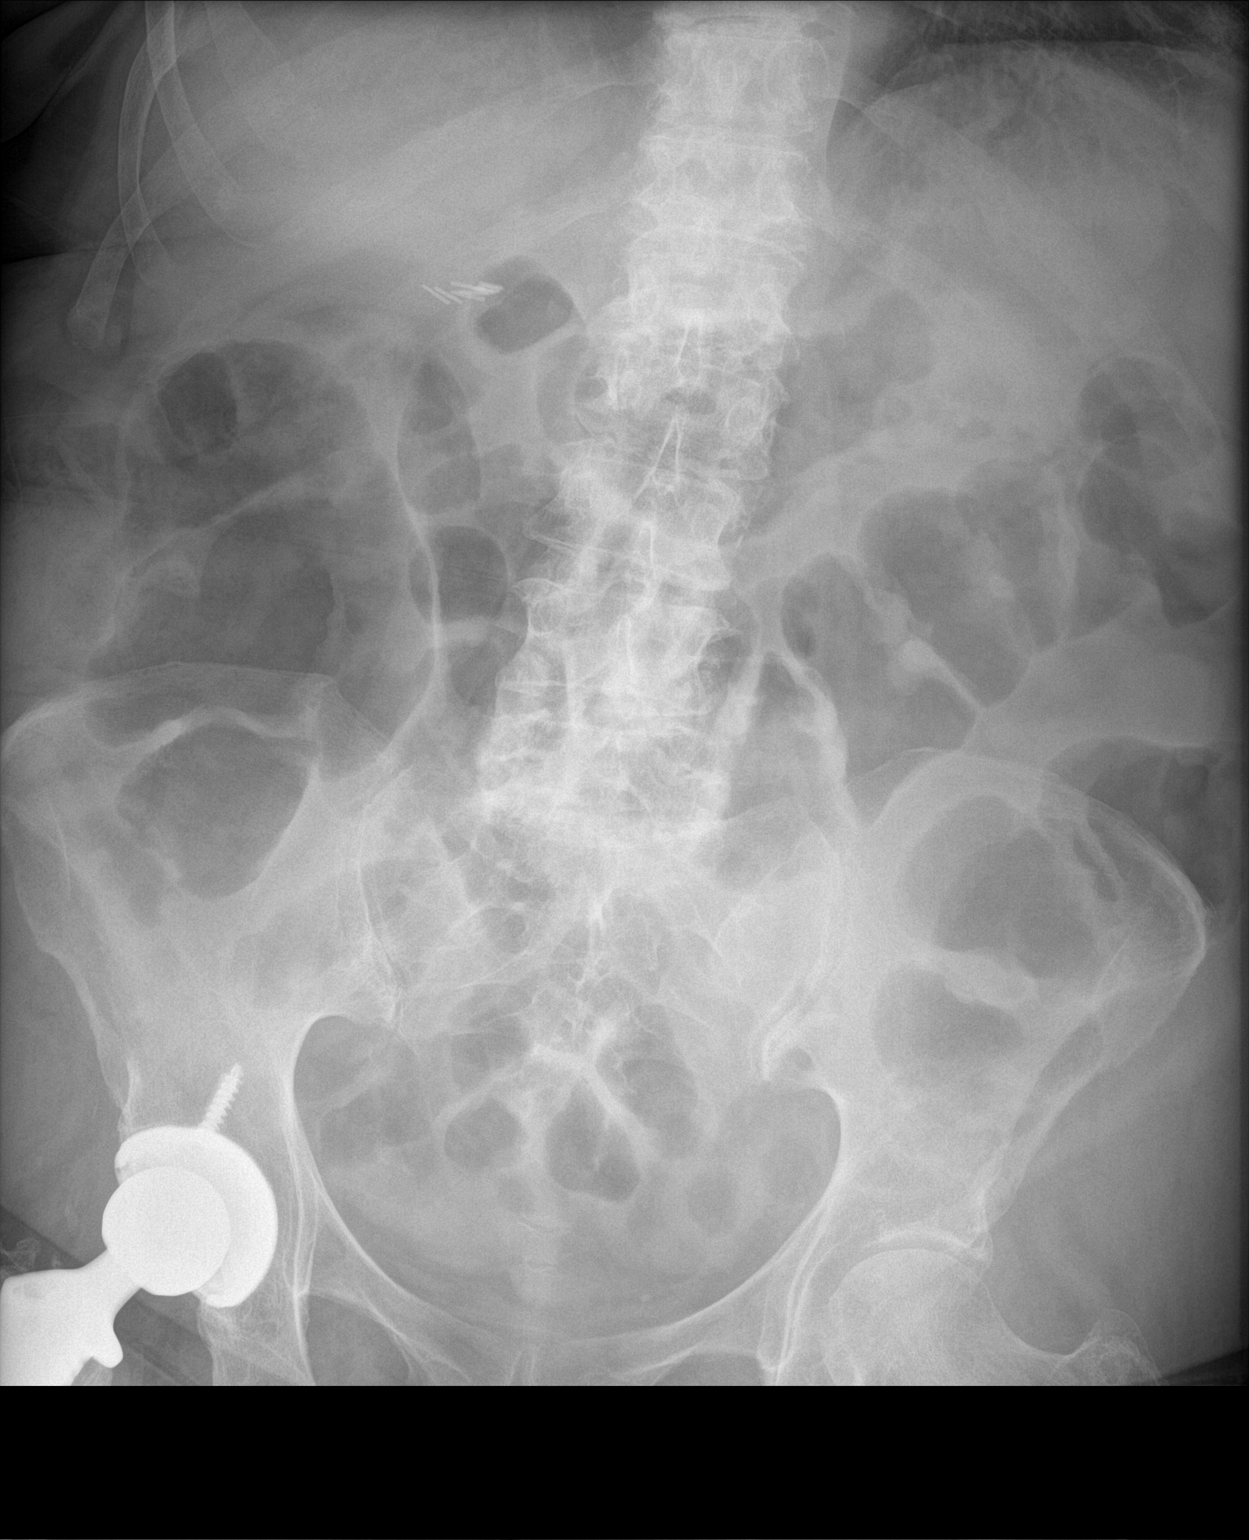

[1 of 1 positions shown; findings below may reference images not displayed]

FINDINGS: Mild gaseous distention of colon which is similar to prior CT. No
small bowel dilatation. No evidence of free air in the single supine
view. Cholecystectomy clips in the right upper quadrant. No
radiopaque calculi. Right hip arthroplasty. Degenerative change in
the lumbar spine.
IMPRESSION: Mild gaseous distention of the colon, similar to recent CT. No
evidence of bowel obstruction.

## 2020-01-25 MED ORDER — POTASSIUM CHLORIDE 10 MEQ/100ML IV SOLN
10.0000 meq | INTRAVENOUS | Status: AC
  Administered 2020-01-25 (×4): 10 meq via INTRAVENOUS
  Filled 2020-01-25 (×4): qty 100

## 2020-01-25 MED ORDER — MAGNESIUM SULFATE 2 GM/50ML IV SOLN
2.0000 g | Freq: Once | INTRAVENOUS | Status: DC
  Administered 2020-01-25: 2 g via INTRAVENOUS
  Filled 2020-01-25: qty 50

## 2020-01-25 MED ORDER — FERROUS SULFATE 325 (65 FE) MG PO TABS
325.0000 mg | ORAL_TABLET | Freq: Every day | ORAL | Status: DC
  Administered 2020-01-26 – 2020-01-27 (×2): 325 mg via ORAL
  Filled 2020-01-25 (×2): qty 1

## 2020-01-25 MED ORDER — SODIUM CHLORIDE 0.9 % IV SOLN
510.0000 mg | Freq: Once | INTRAVENOUS | Status: AC
  Administered 2020-01-25: 510 mg via INTRAVENOUS
  Filled 2020-01-25: qty 510

## 2020-01-25 MED ORDER — SENNOSIDES-DOCUSATE SODIUM 8.6-50 MG PO TABS: 2.0000 | ORAL_TABLET | Freq: Two times a day (BID) | ORAL | Status: DC

## 2020-01-25 MED ORDER — SENNOSIDES-DOCUSATE SODIUM 8.6-50 MG PO TABS
2.0000 | ORAL_TABLET | Freq: Every day | ORAL | Status: DC
  Filled 2020-01-25 (×2): qty 2

## 2020-01-25 MED ORDER — MAGNESIUM SULFATE 4 GM/100ML IV SOLN
4.0000 g | Freq: Once | INTRAVENOUS | Status: AC
  Administered 2020-01-25: 4 g via INTRAVENOUS
  Filled 2020-01-25: qty 100

## 2020-01-25 MED ORDER — POLYETHYLENE GLYCOL 3350 17 G PO PACK
17.0000 g | PACK | Freq: Every day | ORAL | Status: DC | PRN
  Administered 2020-01-26: 17 g via ORAL
  Filled 2020-01-25: qty 1

## 2020-01-25 NOTE — Progress Notes (Signed)
CRITICAL VALUE ALERT  Critical Value:  Mag level 0.7  Date & Time Notied: 01/25/20 @ 0240  Provider Notified: Dr Hal Hope  Orders Received/Actions taken: waiting for response

## 2020-01-25 NOTE — Progress Notes (Signed)
PROGRESS NOTE    Cassidy Garcia  KTG:[id number] DOB: 1946-07-02 DOA: 01/24/2020 PCP: Burnard Bunting, MD   Brief Narrative:  74 yo with hx of Depression, asthma, diverticulosis, hypothyroidism, GERD, HTN, CKD stage III a baseline creatinine 1.1 comes to the hospital with complaints of nausea vomiting and diarrhea.  She states she has been dealing with constipation and diarrhea for the past month, diagnosed with UTI about a week ago and took 2 doses of Cipro.  Continued to feel bad therefore came to the hospital.  Diagnosed with AKI.   Assessment & Plan:   Active Problems:   HTN (hypertension)   Hyperlipidemia   Depression   Asthma   CKD (chronic kidney disease), stage III   Acute on chronic renal failure (HCC)  Acute kidney injury on CKD stage IIIa, baseline creatinine 1.1 -Likely prerenal in nature, getting IV fluids.  Avoid nephrotoxic drugs.  Admission creatinine 4.09, this morning 3.19. -Seen by nephrology. -Monitor urine output. -CT abdomen pelvis 01/16/2020-negative for any acute pathology.  Will repeat abdominal x-ray today.  Nausea vomiting, improved Chronic constipation with intermittent diarrhea -Abdominal x-ray, diet as tolerated.  Seems to be tolerating heart healthy diet this morning  Essential hypertension -Currently on hold due to oral and low blood pressure yesterday.  Will resume as appropriate  Hyperlipidemia -Statin, CK levels within normal limits.  GERD -PPI  Hypothyroidism -Low TSH, check T4.  Continue Synthroid  Anemia of chronic disease Mild iron deficiency -Low iron saturation, but borderline low normal ferritin.  IV iron today, start p.o. tomorrow.  Bowel regimen   DVT prophylaxis: Subcu heparin Code Status: Full code Family Communication: None  Status is: Inpatient  Remains inpatient appropriate because:IV treatments appropriate due to intensity of illness or inability to take PO   Dispo: The patient is from: Home               Anticipated d/c is to: Home              Anticipated d/c date is: 2 days              Patient currently is not medically stable to d/c.  Maintain hospital stay as patient requires IV fluid given significant acute kidney injury, improving slowly.  Unsafe for discharge.  We need to monitor her urine output  Consultants:   Nephrology   Subjective: Feels okay no complaints this morning, tells me she started feeling better after receiving IV fluids.  Denies any further diarrhea.  Review of Systems Otherwise negative except as per HPI, including: General: Denies fever, chills, night sweats or unintended weight loss. Resp: Denies cough, wheezing, shortness of breath. Cardiac: Denies chest pain, palpitations, orthopnea, paroxysmal nocturnal dyspnea. GI: Denies abdominal pain, nausea, vomiting, diarrhea or constipation GU: Denies dysuria, frequency, hesitancy or incontinence MS: Denies muscle aches, joint pain or swelling Neuro: Denies headache, neurologic deficits (focal weakness, numbness, tingling), abnormal gait Psych: Denies anxiety, depression, SI/HI/AVH Skin: Denies new rashes or lesions ID: Denies sick contacts, exotic exposures, travel  Examination:  General exam: Appears calm and comfortable  Respiratory system: Clear to auscultation. Respiratory effort normal. Cardiovascular system: S1 & S2 heard, RRR. No JVD, murmurs, rubs, gallops or clicks. No pedal edema. Gastrointestinal system: Abdomen is nondistended, soft and nontender. No organomegaly or masses felt. Normal bowel sounds heard. Central nervous system: Alert and oriented. No focal neurological deficits. Extremities: Symmetric 5 x 5 power. Skin: No rashes, lesions or ulcers Psychiatry: Judgement and insight appear normal. Mood &  affect appropriate.     Objective: Vitals:   01/24/20 1837 01/24/20 1904 01/24/20 2110 01/25/20 0612  BP: 130/62  128/64 (!) 125/56  Pulse: 95  92 88  Resp: 17  18 16   Temp: 98.6 F (37 C)   98.5 F (36.9 C) 98 F (36.7 C)  TempSrc: Oral  Oral Oral  SpO2: 97%  95% 99%  Weight:      Height:  5\' 5"  (1.651 m)      Intake/Output Summary (Last 24 hours) at 01/25/2020 0835 Last data filed at 01/25/2020 0547 Gross per 24 hour  Intake 1483.92 ml  Output 325 ml  Net 1158.92 ml   Filed Weights   01/24/20 1754  Weight: 91.5 kg     Data Reviewed:   CBC: Recent Labs  Lab 01/24/20 1103 01/25/20 0546  WBC 7.9 6.2  NEUTROABS  --  4.7  HGB 11.0* 8.8*  HCT 31.8* 25.9*  MCV 92.7 93.2  PLT 244 096   Basic Metabolic Panel: Recent Labs  Lab 01/24/20 1103 01/25/20 0546  NA 137 134*  137  K 3.6 3.2*  3.3*  CL 105 105  109  CO2 20* 17*  16*  GLUCOSE 112* 98  93  BUN 48* 49*  48*  CREATININE 4.09* 3.19*  3.37*  CALCIUM 7.1* 7.0*  7.0*  MG  --  0.7*  PHOS  --  5.5*  5.3*   GFR: Estimated Creatinine Clearance: 16.4 mL/min (A) (by C-G formula based on SCr of 3.37 mg/dL (H)). Liver Function Tests: Recent Labs  Lab 01/24/20 1103 01/25/20 0546  AST 19 18  ALT 20 18  ALKPHOS 64 58  BILITOT 0.9 0.5  PROT 5.6* 4.8*  ALBUMIN 3.0* 2.5*  2.4*   Recent Labs  Lab 01/24/20 1103  LIPASE 40   No results for input(s): AMMONIA in the last 168 hours. Coagulation Profile: No results for input(s): INR, PROTIME in the last 168 hours. Cardiac Enzymes: Recent Labs  Lab 01/24/20 1802  CKTOTAL 64   BNP (last 3 results) No results for input(s): PROBNP in the last 8760 hours. HbA1C: No results for input(s): HGBA1C in the last 72 hours. CBG: No results for input(s): GLUCAP in the last 168 hours. Lipid Profile: Recent Labs    01/25/20 0546  CHOL 78  HDL 23*  LDLCALC 32  TRIG 113  CHOLHDL 3.4   Thyroid Function Tests: Recent Labs    01/24/20 1802  TSH 0.309*   Anemia Panel: Recent Labs    01/24/20 1802  VITAMINB12 478  FERRITIN 169  TIBC 289  IRON 28   Sepsis Labs: No results for input(s): PROCALCITON, LATICACIDVEN in the last 168  hours.  Recent Results (from the past 240 hour(s))  SARS Coronavirus 2 by RT PCR (hospital order, performed in [facility] hospital lab) Nasopharyngeal Nasopharyngeal Swab     Status: None   Collection Time: 01/24/20  2:51 PM   Specimen: Nasopharyngeal Swab  Result Value Ref Range Status   SARS Coronavirus 2 NEGATIVE NEGATIVE Final    Comment: (NOTE) SARS-CoV-2 target nucleic acids are NOT DETECTED.  The SARS-CoV-2 RNA is generally detectable in upper and lower respiratory specimens during the acute phase of infection. The lowest concentration of SARS-CoV-2 viral copies this assay can detect is 250 copies / mL. A negative result does not preclude SARS-CoV-2 infection and should not be used as the sole basis for treatment or other patient management decisions.  A negative result may occur with improper  specimen collection / handling, submission of specimen other than nasopharyngeal swab, presence of viral mutation(s) within the areas targeted by this assay, and inadequate number of viral copies (<250 copies / mL). A negative result must be combined with clinical observations, patient history, and epidemiological information.  Fact Sheet for Patients:   StrictlyIdeas.no  Fact Sheet for Healthcare Providers: BankingDealers.co.za  This test is not yet approved or  cleared by the [place] FDA and has been authorized for detection and/or diagnosis of SARS-CoV-2 by FDA under an Emergency Use Authorization (EUA).  This EUA will remain in effect (meaning this test can be used) for the duration of the COVID-19 declaration under Section 564(b)(1) of the Act, 21 U.S.C. section 360bbb-3(b)(1), unless the authorization is terminated or revoked sooner.  Performed at [facility], [place] [address], [place], [place] [address]          Radiology Studies: US RENAL  Result Date: 01/24/2020 CLINICAL DATA:  74 year old  female with acute on chronic renal failure. EXAM: RENAL / URINARY TRACT ULTRASOUND COMPLETE COMPARISON:  CT Abdomen and Pelvis without contrast 01/16/2020. FINDINGS: Right Kidney: Renal measurements: 8.8 x 5.2 x 4.5 cm = volume: 107 mL . Echogenicity within normal limits. No mass or hydronephrosis visualized. Left Kidney: Renal measurements: 10 x 4.9 x 3.6 cm = volume: 91 mL. Echogenicity within normal limits. No mass or hydronephrosis visualized. Bladder: Appears normal for degree of bladder distention. Other: Echogenic liver (image 6). IMPRESSION: 1. Normal ultrasound appearance of both kidneys and the urinary bladder. 2. Evidence of hepatic steatosis. Electronically Signed   By: Genevie Ann M.D.   On: 01/24/2020 18:47        Scheduled Meds: . amitriptyline  75 mg Oral QHS  . aspirin EC  81 mg Oral Daily  . atorvastatin  40 mg Oral Daily  . cholecalciferol  1,000 Units Oral Daily  . heparin  5,000 Units Subcutaneous Q8H  . levothyroxine  50 mcg Oral Q breakfast  . mometasone-formoterol  2 puff Inhalation BID  . pantoprazole  40 mg Oral Daily   Continuous Infusions: . sodium chloride 50 mL/hr at 01/25/20 0550  . magnesium sulfate bolus IVPB       LOS: 1 day   Time spent= 35 mins    Tyqwan Pink Arsenio Loader, MD [facility]  If 7PM-7AM, please contact night-coverage  01/25/2020, 8:35 AM

## 2020-01-25 NOTE — Progress Notes (Signed)
[facility] Progress Note   Subjective:   Feeling a bit better (stronger) today but had loose stools today.  AXR pending.  Sister at bedside.  No issues urinating.    Objective Vitals:   01/24/20 1837 01/24/20 1904 01/24/20 2110 01/25/20 0612  BP: 130/62  128/64 (!) 125/56  Pulse: 95  92 88  Resp: 17  18 16   Temp: 98.6 F (37 C)  98.5 F (36.9 C) 98 F (36.7 C)  TempSrc: Oral  Oral Oral  SpO2: 97%  95% 99%  Weight:      Height:  5\' 5"  (1.651 m)     Physical Exam General: lying flat in bed comfortably Heart: RRR Lungs: clear Abdomen: soft, obese Extremities: no edema  Additional Objective Labs: Basic Metabolic Panel: Recent Labs  Lab 01/24/20 1103 01/25/20 0546  NA 137 134*  137  K 3.6 3.2*  3.3*  CL 105 105  109  CO2 20* 17*  16*  GLUCOSE 112* 98  93  BUN 48* 49*  48*  CREATININE 4.09* 3.19*  3.37*  CALCIUM 7.1* 7.0*  7.0*  PHOS  --  5.5*  5.3*   Liver Function Tests: Recent Labs  Lab 01/24/20 1103 01/25/20 0546  AST 19 18  ALT 20 18  ALKPHOS 64 58  BILITOT 0.9 0.5  PROT 5.6* 4.8*  ALBUMIN 3.0* 2.5*  2.4*   Recent Labs  Lab 01/24/20 1103  LIPASE 40   CBC: Recent Labs  Lab 01/24/20 1103 01/25/20 0546  WBC 7.9 6.2  NEUTROABS  --  4.7  HGB 11.0* 8.8*  HCT 31.8* 25.9*  MCV 92.7 93.2  PLT 244 184   Blood Culture    Component Value Date/Time   SDES  01/24/2020 1348    URINE, CLEAN CATCH Performed at [facility], [place] [address], [place], [place] [address]    SPECREQUEST  01/24/2020 1348    NONE Performed at [facility], [place] [address], [place], [place] [address]    CULT  01/24/2020 1348    NO GROWTH Performed at [place] [address] [place], [place] [address]    REPTSTATUS 01/25/2020 FINAL 01/24/2020 1348    Cardiac Enzymes: Recent Labs  Lab 01/24/20 1802  CKTOTAL 64   CBG: No results for input(s): GLUCAP in the last 168 hours. Iron Studies:   Recent Labs    01/24/20 1802  IRON 28  TIBC 289  FERRITIN 169   @lablastinr3 @ Studies/Results: US RENAL  Result Date: 01/24/2020 CLINICAL DATA:  74 year old female with acute on chronic renal failure. EXAM: RENAL / URINARY TRACT ULTRASOUND COMPLETE COMPARISON:  CT Abdomen and Pelvis without contrast 01/16/2020. FINDINGS: Right Kidney: Renal measurements: 8.8 x 5.2 x 4.5 cm = volume: 107 mL . Echogenicity within normal limits. No mass or hydronephrosis visualized. Left Kidney: Renal measurements: 10 x 4.9 x 3.6 cm = volume: 91 mL. Echogenicity within normal limits. No mass or hydronephrosis visualized. Bladder: Appears normal for degree of bladder distention. Other: Echogenic liver (image 6). IMPRESSION: 1. Normal ultrasound appearance of both kidneys and the urinary bladder. 2. Evidence of hepatic steatosis. Electronically Signed   By: Genevie Ann M.D.   On: 01/24/2020 18:47   Medications: . sodium chloride 50 mL/hr at 01/25/20 0550  . ferumoxytol     . amitriptyline  75 mg Oral QHS  . aspirin EC  81 mg Oral Daily  . atorvastatin  40 mg Oral Daily  . cholecalciferol  1,000 Units Oral Daily  . [START ON 01/26/2020] ferrous sulfate  325 mg Oral Q breakfast  . heparin  5,000 Units Subcutaneous Q8H  . levothyroxine  50 mcg Oral Q breakfast  . mometasone-formoterol  2 puff Inhalation BID  . pantoprazole  40 mg Oral Daily  . senna-docusate  2 tablet Oral QHS    Assessment/Plan: 1.  AKI- presumably volume depletion/ischemic ATN in setting of N/V/D, poor po intake, and hypotension with concomitant use of an ARB and NSAIDs.  CT without obstruction on 01/16/20.  1. Continue to old Hyzaar and other BP meds due to hypotension 2. Continue with IVF's today 3. Continue to follow UOP and daily Scr  4. Cont to hold diclofenac 5. No indication for dialysis and hopefully will see continued improvement.  2. Intermittent N/V/D- with mildly elevated lipase.  Unclear etiology.  GI workup per primary  svc. 3. Hypovolemic hypotension- improved with IVF's 4.  hyperglycemia- follow 5. HTN- now with low bp.  Hold meds as above 6. HLD- takes a statin, CK was normal. 7. Normocytic Anemia- iron sat 10%, ferritin 169; has been started on po iron.  Per primary  Will continue to follow, page with issues.   Jannifer Hick MD 01/25/2020, 1:36 PM  [facility] Pager: [phone]

## 2020-01-26 LAB — CBC WITH DIFFERENTIAL/PLATELET
Abs Immature Granulocytes: 0.02 10*3/uL (ref 0.00–0.07)
Basophils Absolute: 0 10*3/uL (ref 0.0–0.1)
Basophils Relative: 0 %
Eosinophils Absolute: 0.4 10*3/uL (ref 0.0–0.5)
Eosinophils Relative: 6 %
HCT: 27.1 % — ABNORMAL LOW (ref 36.0–46.0)
Hemoglobin: 9.1 g/dL — ABNORMAL LOW (ref 12.0–15.0)
Immature Granulocytes: 0 %
Lymphocytes Relative: 13 %
Lymphs Abs: 0.8 10*3/uL (ref 0.7–4.0)
MCH: 31.4 pg (ref 26.0–34.0)
MCHC: 33.6 g/dL (ref 30.0–36.0)
MCV: 93.4 fL (ref 80.0–100.0)
Monocytes Absolute: 0.7 10*3/uL (ref 0.1–1.0)
Monocytes Relative: 11 %
Neutro Abs: 4.3 10*3/uL (ref 1.7–7.7)
Neutrophils Relative %: 70 %
Platelets: 180 10*3/uL (ref 150–400)
RBC: 2.9 MIL/uL — ABNORMAL LOW (ref 3.87–5.11)
RDW: 13.1 % (ref 11.5–15.5)
WBC: 6.2 10*3/uL (ref 4.0–10.5)
nRBC: 0 % (ref 0.0–0.2)

## 2020-01-26 LAB — COMPREHENSIVE METABOLIC PANEL
ALT: 18 U/L (ref 0–44)
AST: 17 U/L (ref 15–41)
Albumin: 2.3 g/dL — ABNORMAL LOW (ref 3.5–5.0)
Alkaline Phosphatase: 55 U/L (ref 38–126)
Anion gap: 9 (ref 5–15)
BUN: 35 mg/dL — ABNORMAL HIGH (ref 8–23)
CO2: 18 mmol/L — ABNORMAL LOW (ref 22–32)
Calcium: 7.6 mg/dL — ABNORMAL LOW (ref 8.9–10.3)
Chloride: 110 mmol/L (ref 98–111)
Creatinine, Ser: 2.2 mg/dL — ABNORMAL HIGH (ref 0.44–1.00)
GFR calc Af Amer: 25 mL/min — ABNORMAL LOW (ref >60)
GFR calc non Af Amer: 21 mL/min — ABNORMAL LOW (ref >60)
Glucose, Bld: 99 mg/dL (ref 70–99)
Potassium: 3.4 mmol/L — ABNORMAL LOW (ref 3.5–5.1)
Sodium: 137 mmol/L (ref 135–145)
Total Bilirubin: 0.8 mg/dL (ref 0.3–1.2)
Total Protein: 4.4 g/dL — ABNORMAL LOW (ref 6.5–8.1)

## 2020-01-26 LAB — T4, FREE: Free T4: 1.39 ng/dL — ABNORMAL HIGH (ref 0.61–1.12)

## 2020-01-26 LAB — PHOSPHORUS: Phosphorus: 4 mg/dL (ref 2.5–4.6)

## 2020-01-26 LAB — MAGNESIUM: Magnesium: 1.5 mg/dL — ABNORMAL LOW (ref 1.7–2.4)

## 2020-01-26 MED ORDER — ALUM & MAG HYDROXIDE-SIMETH 200-200-20 MG/5ML PO SUSP
15.0000 mL | ORAL | Status: DC | PRN
  Administered 2020-01-26 – 2020-01-27 (×4): 15 mL via ORAL
  Filled 2020-01-26 (×4): qty 30

## 2020-01-26 MED ORDER — POTASSIUM CHLORIDE CRYS ER 20 MEQ PO TBCR
40.0000 meq | EXTENDED_RELEASE_TABLET | Freq: Once | ORAL | Status: AC
  Administered 2020-01-26: 40 meq via ORAL
  Filled 2020-01-26: qty 2

## 2020-01-26 MED ORDER — MAGNESIUM SULFATE 2 GM/50ML IV SOLN
2.0000 g | Freq: Once | INTRAVENOUS | Status: AC
  Administered 2020-01-26: 2 g via INTRAVENOUS
  Filled 2020-01-26: qty 50

## 2020-01-26 NOTE — Progress Notes (Signed)
[facility] Progress Note   Subjective:   Feeling better with improved po intake.  Liquid intake about normal, still not ready for full solids but bland ok.  AXR yesterday with gas.  Sister at bedside.  No issues urinating.  I/Os 1.5 / 0.7 (all UOP).  Objective Vitals:   01/25/20 0612 01/25/20 1603 01/25/20 2118 01/26/20 0629  BP: (!) 125/56 115/64 129/70 140/65  Pulse: 88 88 94 80  Resp: 16 16 17 16   Temp: 98 F (36.7 C) 98.3 F (36.8 C) 98.9 F (37.2 C) 97.7 F (36.5 C)  TempSrc: Oral Oral Oral Oral  SpO2: 99% 97% 94% 97%  Weight:      Height:       Physical Exam General: lying flat in bed comfortably Heart: RRR Lungs: clear Abdomen: soft, obese Extremities: no edema  Additional Objective Labs: Basic Metabolic Panel: Recent Labs  Lab 01/24/20 1103 01/25/20 0546 01/26/20 0541  NA 137 134*  137 137  K 3.6 3.2*  3.3* 3.4*  CL 105 105  109 110  CO2 20* 17*  16* 18*  GLUCOSE 112* 98  93 99  BUN 48* 49*  48* 35*  CREATININE 4.09* 3.19*  3.37* 2.20*  CALCIUM 7.1* 7.0*  7.0* 7.6*  PHOS  --  5.5*  5.3* 4.0   Liver Function Tests: Recent Labs  Lab 01/24/20 1103 01/25/20 0546 01/26/20 0541  AST 19 18 17   ALT 20 18 18   ALKPHOS 64 58 55  BILITOT 0.9 0.5 0.8  PROT 5.6* 4.8* 4.4*  ALBUMIN 3.0* 2.5*  2.4* 2.3*   Recent Labs  Lab 01/24/20 1103  LIPASE 40   CBC: Recent Labs  Lab 01/24/20 1103 01/24/20 1802 01/25/20 0546 01/26/20 0541  WBC 7.9  --  6.2 6.2  NEUTROABS  --   --  4.7 4.3  HGB 11.0*  --  8.8* 9.1*  HCT 31.8* 27.0* 25.9* 27.1*  MCV 92.7  --  93.2 93.4  PLT 244  --  184 180   Blood Culture    Component Value Date/Time   SDES  01/24/2020 1348    URINE, CLEAN CATCH Performed at [facility], [place] [address], [place], [place] [address]    SPECREQUEST  01/24/2020 1348    NONE Performed at [facility], [place] [address], [place], [place] [address]    CULT  01/24/2020 1348    NO  GROWTH Performed at [place] [address] [place], [place] [address]    REPTSTATUS 01/25/2020 FINAL 01/24/2020 1348    Cardiac Enzymes: Recent Labs  Lab 01/24/20 1802  CKTOTAL 64   CBG: No results for input(s): GLUCAP in the last 168 hours. Iron Studies:  Recent Labs    01/24/20 1802  IRON 28  TIBC 289  FERRITIN 169   @lablastinr3 @ Studies/Results: DG Abd 1 View  Result Date: 01/25/2020 CLINICAL DATA:  Abdominal distension. EXAM: ABDOMEN - 1 VIEW COMPARISON:  Abdominal CT 01/16/2020 FINDINGS: Mild gaseous distention of colon which is similar to prior CT. No small bowel dilatation. No evidence of free air in the single supine view. Cholecystectomy clips in the right upper quadrant. No radiopaque calculi. Right hip arthroplasty. Degenerative change in the lumbar spine. IMPRESSION: Mild gaseous distention of the colon, similar to recent CT. No evidence of bowel obstruction. Electronically Signed   By: Keith Rake M.D.   On: 01/25/2020 15:43   US RENAL  Result Date: 01/24/2020 CLINICAL DATA:  74 year old  female with acute on chronic renal failure. EXAM: RENAL / URINARY TRACT ULTRASOUND COMPLETE COMPARISON:  CT Abdomen and Pelvis without contrast 01/16/2020. FINDINGS: Right Kidney: Renal measurements: 8.8 x 5.2 x 4.5 cm = volume: 107 mL . Echogenicity within normal limits. No mass or hydronephrosis visualized. Left Kidney: Renal measurements: 10 x 4.9 x 3.6 cm = volume: 91 mL. Echogenicity within normal limits. No mass or hydronephrosis visualized. Bladder: Appears normal for degree of bladder distention. Other: Echogenic liver (image 6). IMPRESSION: 1. Normal ultrasound appearance of both kidneys and the urinary bladder. 2. Evidence of hepatic steatosis. Electronically Signed   By: Genevie Ann M.D.   On: 01/24/2020 18:47   Medications: . sodium chloride 50 mL/hr at 01/25/20 1714   . amitriptyline  75 mg Oral QHS  . aspirin EC  81 mg Oral Daily  . atorvastatin  40 mg  Oral Daily  . cholecalciferol  1,000 Units Oral Daily  . ferrous sulfate  325 mg Oral Q breakfast  . heparin  5,000 Units Subcutaneous Q8H  . levothyroxine  50 mcg Oral Q breakfast  . mometasone-formoterol  2 puff Inhalation BID  . pantoprazole  40 mg Oral Daily  . senna-docusate  2 tablet Oral QHS    Assessment/Plan: 1.  AKI- presumably volume depletion/ischemic ATN in setting of N/V/D, poor po intake, and hypotension with concomitant use of an ARB and NSAIDs.  CT without obstruction on 01/16/20.  Renal US normal 6/10. 1. Continue to hold Hyzaar and other BP meds due to hypotension 2. D/c IVF today to make sure she can support oral intake - I d/c'd fluids 3. Continue to follow UOP and daily Scr  4. Cont to hold diclofenac 5. Should see continued improvement.  2. Intermittent N/V/D- with mildly elevated lipase.  Unclear etiology.  GI workup per primary svc. 3. Hypovolemic hypotension- improved with IVF's 4.  hyperglycemia- AMFBG normal 5. HTN- Was hypotensive now normotensive.  Hold meds as above 6. HLD- takes a statin, CK was normal. 7. Normocytic Anemia- iron sat 10%, ferritin 169; rec'd IV iron dose today.  Per primary 8. Hypokalemia: po repletion per Primary today.  Observe today off IVF, AM labs.  If looks good and renal function continues to improve would be ok to d/c if abdominal issues are improved.  If Cr < 2 and oral intake normal tomorrow, ok to resume home antiHTN meds if she needs them (including Hyzaar).   I will sign off for now but look at her labs tomorrow.  I think she'll return to baseline in the next 4 weeks and can see her PCP in follow up.  If there are ongoing concerns or questions I'm happy to see her in the office and I provided her my card today.    Call me with concerns.  Jannifer Hick MD 01/26/2020, 12:23 PM  [facility] Pager: [phone]

## 2020-01-26 NOTE — Progress Notes (Signed)
PROGRESS NOTE    Cassidy Garcia  PQZ:[id number] DOB: [birth year] DOA: 01/24/2020 PCP: Burnard Bunting, MD   Brief Narrative:  74 yo with hx of Depression, asthma, diverticulosis, hypothyroidism, GERD, HTN, CKD stage III a baseline creatinine 1.1 comes to the hospital with complaints of nausea vomiting and diarrhea.  She states she has been dealing with constipation and diarrhea for the past month, diagnosed with UTI about a week ago and took 2 doses of Cipro.  Continued to feel bad therefore came to the hospital.  Diagnosed with AKI.  Continues to improve slowly with fluids.  Nephrology is following   Assessment & Plan:   Active Problems:   HTN (hypertension)   Hyperlipidemia   Depression   Asthma   CKD (chronic kidney disease), stage III   Acute on chronic renal failure (HCC)  Acute kidney injury on CKD stage IIIa, baseline creatinine 1.1 -Likely prerenal in nature, getting IV fluids.  Avoid nephrotoxic drugs.  Admission creatinine 4.09, improving with fluids -Seen by nephrology -Continue IV fluids and monitor urine output. -CT abdomen pelvis 01/16/2020-negative for any acute pathology.  Will repeat abdominal x-ray today.  Nausea vomiting, improved Chronic constipation with intermittent diarrhea -Abdominal x-ray-mild gaseous distention.  Out of bed to chair, ambulation. -Bowel regimen  Essential hypertension -Currently on hold due to oral and low blood pressure yesterday.  Will resume as appropriate  Hyperlipidemia -Statin, CK levels within normal limits.  GERD -PPI  Hypothyroidism -Low TSH, check T4.  Continue Synthroid  Anemia of chronic disease Mild iron deficiency -Low iron saturation, but borderline low normal ferritin.  Status post IV iron, now on p.o. with bowel regimen   DVT prophylaxis: Subcu heparin Code Status: Full code Family Communication: None  Status is: Inpatient  Remains inpatient appropriate because:IV treatments appropriate due to intensity of  illness or inability to take PO   Dispo: The patient is from: Home              Anticipated d/c is to: Home              Anticipated d/c date is: 1 day              Patient currently is not medically stable to d/c.  Renal function continues to improve but not at baseline.  Inconsistent p.o. intake.  Maintain hospital stay for 1 more day of IV fluids.  Hopefully home tomorrow  Consultants:   Nephrology   Subjective: Feels well better compared to yesterday.  She is able to pass gas.    Review of Systems Otherwise negative except as per HPI, including: General: Denies fever, chills, night sweats or unintended weight loss. Resp: Denies cough, wheezing, shortness of breath. Cardiac: Denies chest pain, palpitations, orthopnea, paroxysmal nocturnal dyspnea. GI: Denies abdominal pain, nausea, vomiting, diarrhea or constipation GU: Denies dysuria, frequency, hesitancy or incontinence MS: Denies muscle aches, joint pain or swelling Neuro: Denies headache, neurologic deficits (focal weakness, numbness, tingling), abnormal gait Psych: Denies anxiety, depression, SI/HI/AVH Skin: Denies new rashes or lesions ID: Denies sick contacts, exotic exposures, travel  Examination: Constitutional: Not in acute distress Respiratory: Clear to auscultation bilaterally Cardiovascular: Normal sinus rhythm, no rubs Abdomen: Mild distention but nontender Musculoskeletal: No edema noted Skin: No rashes seen Neurologic: CN 2-12 grossly intact.  And nonfocal Psychiatric: Normal judgment and insight. Alert and oriented x 3. Normal mood.  Objective: Vitals:   01/25/20 0612 01/25/20 1603 01/25/20 2118 01/26/20 0629  BP: (!) 125/56 115/64 129/70 140/65  Pulse:  88 88 94 80  Resp: 16 16 17 16   Temp: 98 F (36.7 C) 98.3 F (36.8 C) 98.9 F (37.2 C) 97.7 F (36.5 C)  TempSrc: Oral Oral Oral Oral  SpO2: 99% 97% 94% 97%  Weight:      Height:        Intake/Output Summary (Last 24 hours) at 01/26/2020  0815 Last data filed at 01/26/2020 0342 Gross per 24 hour  Intake 1516.51 ml  Output 700 ml  Net 816.51 ml   Filed Weights   01/24/20 1754  Weight: 91.5 kg     Data Reviewed:   CBC: Recent Labs  Lab 01/24/20 1103 01/24/20 1802 01/25/20 0546 01/26/20 0541  WBC 7.9  --  6.2 6.2  NEUTROABS  --   --  4.7 4.3  HGB 11.0*  --  8.8* 9.1*  HCT 31.8* 27.0* 25.9* 27.1*  MCV 92.7  --  93.2 93.4  PLT 244  --  184 315   Basic Metabolic Panel: Recent Labs  Lab 01/24/20 1103 01/25/20 0546 01/26/20 0541  NA 137 134*  137 137  K 3.6 3.2*  3.3* 3.4*  CL 105 105  109 110  CO2 20* 17*  16* 18*  GLUCOSE 112* 98  93 99  BUN 48* 49*  48* 35*  CREATININE 4.09* 3.19*  3.37* 2.20*  CALCIUM 7.1* 7.0*  7.0* 7.6*  MG  --  0.7* 1.5*  PHOS  --  5.5*  5.3* 4.0   GFR: Estimated Creatinine Clearance: 25.1 mL/min (A) (by C-G formula based on SCr of 2.2 mg/dL (H)). Liver Function Tests: Recent Labs  Lab 01/24/20 1103 01/25/20 0546 01/26/20 0541  AST 19 18 17   ALT 20 18 18   ALKPHOS 64 58 55  BILITOT 0.9 0.5 0.8  PROT 5.6* 4.8* 4.4*  ALBUMIN 3.0* 2.5*  2.4* 2.3*   Recent Labs  Lab 01/24/20 1103  LIPASE 40   No results for input(s): AMMONIA in the last 168 hours. Coagulation Profile: No results for input(s): INR, PROTIME in the last 168 hours. Cardiac Enzymes: Recent Labs  Lab 01/24/20 1802  CKTOTAL 64   BNP (last 3 results) No results for input(s): PROBNP in the last 8760 hours. HbA1C: No results for input(s): HGBA1C in the last 72 hours. CBG: No results for input(s): GLUCAP in the last 168 hours. Lipid Profile: Recent Labs    01/25/20 0546  CHOL 78  HDL 23*  LDLCALC 32  TRIG 113  CHOLHDL 3.4   Thyroid Function Tests: Recent Labs    01/24/20 1802  TSH 0.309*   Anemia Panel: Recent Labs    01/24/20 1802  VITAMINB12 478  FERRITIN 169  TIBC 289  IRON 28   Sepsis Labs: No results for input(s): PROCALCITON, LATICACIDVEN in the last 168  hours.  Recent Results (from the past 240 hour(s))  Urine culture     Status: None   Collection Time: 01/24/20  1:48 PM   Specimen: Urine, Clean Catch  Result Value Ref Range Status   Specimen Description   Final    URINE, CLEAN CATCH Performed at [facility], [place] [address], [place], [place] [address]    Special Requests   Final    NONE Performed at [facility], [place] [address] [place], [place] [address]    Culture   Final    NO GROWTH Performed at [facility], [place] [address] [place], [place] [address]    Report Status 01/25/2020  FINAL  Final  SARS Coronavirus 2 by RT PCR (hospital order, performed in [facility] hospital lab) Nasopharyngeal Nasopharyngeal Swab     Status: None   Collection Time: 01/24/20  2:51 PM   Specimen: Nasopharyngeal Swab  Result Value Ref Range Status   SARS Coronavirus 2 NEGATIVE NEGATIVE Final    Comment: (NOTE) SARS-CoV-2 target nucleic acids are NOT DETECTED.  The SARS-CoV-2 RNA is generally detectable in upper and lower respiratory specimens during the acute phase of infection. The lowest concentration of SARS-CoV-2 viral copies this assay can detect is 250 copies / mL. A negative result does not preclude SARS-CoV-2 infection and should not be used as the sole basis for treatment or other patient management decisions.  A negative result may occur with improper specimen collection / handling, submission of specimen other than nasopharyngeal swab, presence of viral mutation(s) within the areas targeted by this assay, and inadequate number of viral copies (<250 copies / mL). A negative result must be combined with clinical observations, patient history, and epidemiological information.  Fact Sheet for Patients:   StrictlyIdeas.no  Fact Sheet for Healthcare Providers: BankingDealers.co.za  This test is not yet approved or  cleared by the [place]  FDA and has been authorized for detection and/or diagnosis of SARS-CoV-2 by FDA under an Emergency Use Authorization (EUA).  This EUA will remain in effect (meaning this test can be used) for the duration of the COVID-19 declaration under Section 564(b)(1) of the Act, 21 U.S.C. section 360bbb-3(b)(1), unless the authorization is terminated or revoked sooner.  Performed at [facility], [place] [address], [place], [place] [address]          Radiology Studies: DG Abd 1 View  Result Date: 01/25/2020 CLINICAL DATA:  Abdominal distension. EXAM: ABDOMEN - 1 VIEW COMPARISON:  Abdominal CT 01/16/2020 FINDINGS: Mild gaseous distention of colon which is similar to prior CT. No small bowel dilatation. No evidence of free air in the single supine view. Cholecystectomy clips in the right upper quadrant. No radiopaque calculi. Right hip arthroplasty. Degenerative change in the lumbar spine. IMPRESSION: Mild gaseous distention of the colon, similar to recent CT. No evidence of bowel obstruction. Electronically Signed   By: Keith Rake M.D.   On: 01/25/2020 15:43   US RENAL  Result Date: 01/24/2020 CLINICAL DATA:  74 year old female with acute on chronic renal failure. EXAM: RENAL / URINARY TRACT ULTRASOUND COMPLETE COMPARISON:  CT Abdomen and Pelvis without contrast 01/16/2020. FINDINGS: Right Kidney: Renal measurements: 8.8 x 5.2 x 4.5 cm = volume: 107 mL . Echogenicity within normal limits. No mass or hydronephrosis visualized. Left Kidney: Renal measurements: 10 x 4.9 x 3.6 cm = volume: 91 mL. Echogenicity within normal limits. No mass or hydronephrosis visualized. Bladder: Appears normal for degree of bladder distention. Other: Echogenic liver (image 6). IMPRESSION: 1. Normal ultrasound appearance of both kidneys and the urinary bladder. 2. Evidence of hepatic steatosis. Electronically Signed   By: Genevie Ann M.D.   On: 01/24/2020 18:47        Scheduled Meds: . amitriptyline   75 mg Oral QHS  . aspirin EC  81 mg Oral Daily  . atorvastatin  40 mg Oral Daily  . cholecalciferol  1,000 Units Oral Daily  . ferrous sulfate  325 mg Oral Q breakfast  . heparin  5,000 Units Subcutaneous Q8H  . levothyroxine  50 mcg Oral Q breakfast  . mometasone-formoterol  2 puff Inhalation BID  . pantoprazole  40 mg Oral  Daily  . potassium chloride  40 mEq Oral Once  . senna-docusate  2 tablet Oral QHS   Continuous Infusions: . sodium chloride 50 mL/hr at 01/25/20 1714  . magnesium sulfate bolus IVPB       LOS: 2 days   Time spent= 35 mins    Tashari Schoenfelder Arsenio Loader, MD [facility]  If 7PM-7AM, please contact night-coverage  01/26/2020, 8:15 AM

## 2020-01-27 LAB — COMPREHENSIVE METABOLIC PANEL
ALT: 19 U/L (ref 0–44)
AST: 17 U/L (ref 15–41)
Albumin: 2.9 g/dL — ABNORMAL LOW (ref 3.5–5.0)
Alkaline Phosphatase: 76 U/L (ref 38–126)
Anion gap: 9 (ref 5–15)
BUN: 23 mg/dL (ref 8–23)
CO2: 20 mmol/L — ABNORMAL LOW (ref 22–32)
Calcium: 8.4 mg/dL — ABNORMAL LOW (ref 8.9–10.3)
Chloride: 110 mmol/L (ref 98–111)
Creatinine, Ser: 1.52 mg/dL — ABNORMAL HIGH (ref 0.44–1.00)
GFR calc Af Amer: 39 mL/min — ABNORMAL LOW (ref >60)
GFR calc non Af Amer: 33 mL/min — ABNORMAL LOW (ref >60)
Glucose, Bld: 98 mg/dL (ref 70–99)
Potassium: 3.8 mmol/L (ref 3.5–5.1)
Sodium: 139 mmol/L (ref 135–145)
Total Bilirubin: 0.8 mg/dL (ref 0.3–1.2)
Total Protein: 5.3 g/dL — ABNORMAL LOW (ref 6.5–8.1)

## 2020-01-27 LAB — CBC WITH DIFFERENTIAL/PLATELET
Abs Immature Granulocytes: 0.02 10*3/uL (ref 0.00–0.07)
Basophils Absolute: 0 10*3/uL (ref 0.0–0.1)
Basophils Relative: 0 %
Eosinophils Absolute: 0.4 10*3/uL (ref 0.0–0.5)
Eosinophils Relative: 6 %
HCT: 31.3 % — ABNORMAL LOW (ref 36.0–46.0)
Hemoglobin: 10.4 g/dL — ABNORMAL LOW (ref 12.0–15.0)
Immature Granulocytes: 0 %
Lymphocytes Relative: 19 %
Lymphs Abs: 1.4 10*3/uL (ref 0.7–4.0)
MCH: 31.4 pg (ref 26.0–34.0)
MCHC: 33.2 g/dL (ref 30.0–36.0)
MCV: 94.6 fL (ref 80.0–100.0)
Monocytes Absolute: 0.6 10*3/uL (ref 0.1–1.0)
Monocytes Relative: 9 %
Neutro Abs: 4.6 10*3/uL (ref 1.7–7.7)
Neutrophils Relative %: 66 %
Platelets: 211 10*3/uL (ref 150–400)
RBC: 3.31 MIL/uL — ABNORMAL LOW (ref 3.87–5.11)
RDW: 13 % (ref 11.5–15.5)
WBC: 7 10*3/uL (ref 4.0–10.5)
nRBC: 0 % (ref 0.0–0.2)

## 2020-01-27 LAB — MAGNESIUM: Magnesium: 2 mg/dL (ref 1.7–2.4)

## 2020-01-27 LAB — PHOSPHORUS: Phosphorus: 3.5 mg/dL (ref 2.5–4.6)

## 2020-01-27 MED ORDER — LEVOTHYROXINE SODIUM 25 MCG PO TABS: 25.0000 ug | ORAL_TABLET | Freq: Every day | ORAL | 0 refills | Status: AC

## 2020-01-27 NOTE — Discharge Summary (Signed)
Physician Discharge Summary  Cassidy Garcia MBT:[id number] DOB: 1946-06-30 DOA: 01/24/2020  PCP: Burnard Bunting, MD  Admit date: 01/24/2020 Discharge date: 01/27/2020  Admitted From: Home Disposition:  Home with [facility]  Recommendations for Outpatient Follow-up:  1. Follow up with PCP in 1-2 weeks 2. Please obtain BMP/CBC in one week, TSH and fT4 in 2 weeks   Home Health:Yes, RN Equipment/Devices: None Discharge Condition:Stable CODE STATUS: Full Diet recommendation: Low fiber, soft  Brief/Interim Summary:  HPI: Cassidy Garcia is a 74 y.o. WF PMHx Depression, Asthma, diverticulosis, HTN, HLD, GERD, CKD stage III, (last creatinine 2016 1.1).  presents to the ER for evaluation of generalized malaise for about 3 weeks. Associated with intermittent bouts of nausea, vomiting, non bloody non melenoticdiarrhea, subjective fevers, sweaty/chills, extreme fatigue, headaches. While in the ER noticed gradual onset mild back pain in the middle/flank area that she thinks may be from sitting down for too long, worse with position changes.States for the past week she has to lean against the wall during walking because she gets very tired. Denies any exertional chest pain, shortness of breath, palpitations, lightheadedness or syncope but states walking just feels like "all the cells in my body are tired". Patient went to an urgent care initially where they gave her Zofran. She then followed up with PCP NP May 26who got screening labs and told her everything looks normal. She followed up with his PCP MD on 6/8 who got more labs and added a urinalysis. States she was told her urine showed blood and protein. PCP called in ciprofloxacin. She took 2 doses of this, last dose this morning along with Zofran. This morning around 4 AM she had an episode of dark green and brown emesis. She tested negative for Covid about 10 days ago. Has felt a little constipated, states she wants to prove but cannot. Unsure  if she is had a bowel movement since Sunday. Has been using a bland diet without improvement. Her sister has come down from [place] to help her.Patient states she has been told that she has early onset kidney disease but is not sure what her normal creatinine is. No modifying factors.  Denies frank fever. Denies cough, sore throat, rhinorrhea. No anterior abdominal pain. No dysuria, hematuria or frequency. -States has not had diarrhea since Sunday.  Hospital Course:   Acute kidney injury on CKD stage IIIa, baseline creatinine 1.1 -Likely prerenal in nature, treated with V fluids.  Avoid nephrotoxic drugs. Creatinine 4.09 at presentation, improving to 1.5 wit IV fluids. -Seen by nephrology, agreed with discharge home, patient counseled to avoid NSAIDs-had been on diclofenac at home.  -CT abdomen pelvis 01/16/2020-negative for any acute pathology.    Nausea vomiting, improved Chronic constipation with intermittent diarrhea -Abdominal x-ray-mild gaseous distention.  Out of bed to chair, ambulation. -Bowel regimen -Diarrhea resolved but reported as presented x 1 month.  -No recurrent diarrhea while here but if reoccurs in the outpatient setting, patient advised to have stool sent for GI panel and C. Diff.  -Patient with medication induced hyperthyroidism--levothyroxine dose reduced. Will need level rechecked within 2 weeks.   Essential hypertension -Losartan and HCTZ on hold due to nl BP here and AKI. Discontinued these medications. She will have [facility] RN for medication management. She will check her Bp at home and will report it to her PCP to decided if she should be back on ARB/HCTZ.  -Verapamil continued.   Hyperlipidemia -Statin continued.   GERD -Stable, continue home regimen  Hypothyroidism with  medication induced hyperthyroidism, low TSH and high fT4.  -Levothyroxine dose changed from 85mcg po daily to 25 mcg po daily, needs to follow up with her PCP.    Anemia of chronic  disease Mild iron deficiency -Low iron saturation, but borderline low normal ferritin.  Status post IV iron, now on p.o. with bowel regimen. Will need monitoring of her Hgb in outpatient setting.    Discharge Instructions   Allergies as of 01/27/2020      Reactions   Codeine Nausea And Vomiting   REACTION: Lightheaded, nauses   Demerol [meperidine Hcl] Other (See Comments)   Dizziness    Phenergan [promethazine] Other (See Comments)   Feeling like heart attack      Medication List    STOP taking these medications   ciprofloxacin 500 MG tablet Commonly known as: CIPRO   diclofenac 75 MG EC tablet Commonly known as: VOLTAREN   losartan-hydrochlorothiazide 100-25 MG tablet Commonly known as: HYZAAR   methylPREDNISolone 4 MG tablet Commonly known as: Medrol   oxyCODONE-acetaminophen 5-325 MG tablet Commonly known as: PERCOCET/ROXICET     TAKE these medications   acetaminophen 500 MG tablet Commonly known as: TYLENOL Take 1,000 mg by mouth every 6 (six) hours as needed for moderate pain.   amitriptyline 25 MG tablet Commonly known as: ELAVIL Take 75 mg by mouth at bedtime.   aspirin 81 MG tablet Take 81 mg by mouth daily with breakfast.   atorvastatin 40 MG tablet Commonly known as: LIPITOR Take 40 mg by mouth daily.   cholecalciferol 25 MCG (1000 UNIT) tablet Commonly known as: VITAMIN D3 Take 1,000 Units by mouth daily.   Fish Oil 1200 MG Caps Take 1 capsule by mouth 2 (two) times daily.   levothyroxine 25 MCG tablet Commonly known as: SYNTHROID Take 1 tablet (25 mcg total) by mouth daily before breakfast. What changed:   medication strength  how much to take  when to take this   mometasone 50 MCG/ACT nasal spray Commonly known as: NASONEX Place 2 sprays into the nose daily as needed (allergy). FOR ALLERGIES   multivitamin tablet Take 1 tablet by mouth daily.   omeprazole 20 MG capsule Commonly known as: PRILOSEC Take 20 mg by mouth daily  with breakfast.   ondansetron 8 MG disintegrating tablet Commonly known as: ZOFRAN-ODT Take 8 mg by mouth 3 (three) times daily.   traMADol 50 MG tablet Commonly known as: ULTRAM Take 50 mg by mouth every 6 (six) hours as needed for moderate pain.   Ventolin HFA 108 (90 Base) MCG/ACT inhaler Generic drug: albuterol Inhale 2 puffs into the lungs every 6 (six) hours as needed for wheezing or shortness of breath.   verapamil 240 MG CR tablet Commonly known as: CALAN-SR Take 240 mg by mouth at bedtime.   Wixela Inhub 250-50 MCG/DOSE Aepb Generic drug: Fluticasone-Salmeterol Inhale 1 puff into the lungs 2 (two) times daily.       Allergies  Allergen Reactions  . Codeine Nausea And Vomiting    REACTION: Lightheaded, nauses  . Demerol [Meperidine Hcl] Other (See Comments)    Dizziness   . Phenergan [Promethazine] Other (See Comments)    Feeling like heart attack    Consultations:  Nephrology    Procedures/Studies: CT ABDOMEN PELVIS WO CONTRAST  Result Date: 01/16/2020 CLINICAL DATA:  Fatigue. EXAM: CT ABDOMEN AND PELVIS WITHOUT CONTRAST TECHNIQUE: Multidetector CT imaging of the abdomen and pelvis was performed following the standard protocol without IV contrast. COMPARISON:  None. FINDINGS:  Lower chest: No acute abnormality. Hepatobiliary: No focal liver abnormality is seen. Status post cholecystectomy. No biliary dilatation. Pancreas: Unremarkable. No pancreatic ductal dilatation or surrounding inflammatory changes. Spleen: Normal in size without focal abnormality. Adrenals/Urinary Tract: Adrenal glands are unremarkable. Kidneys are normal, without renal calculi, focal lesion, or hydronephrosis. Bladder is unremarkable. Stomach/Bowel: Stomach is within normal limits. Appendix appears normal. No evidence of bowel wall thickening, distention, or inflammatory changes. Vascular/Lymphatic: Aortic atherosclerosis. No enlarged abdominal or pelvic lymph nodes. Reproductive: Status post  hysterectomy. No adnexal masses. Other: No abdominal wall hernia or abnormality. No abdominopelvic ascites. Musculoskeletal: Multilevel degenerative disc disease is noted in the lumbar spine. No acute osseous abnormality is noted. IMPRESSION: No acute abnormality seen in the abdomen or pelvis. Aortic Atherosclerosis (ICD10-I70.0). Electronically Signed   By: Marijo Conception M.D.   On: 01/16/2020 16:27   DG Abd 1 View  Result Date: 01/25/2020 CLINICAL DATA:  Abdominal distension. EXAM: ABDOMEN - 1 VIEW COMPARISON:  Abdominal CT 01/16/2020 FINDINGS: Mild gaseous distention of colon which is similar to prior CT. No small bowel dilatation. No evidence of free air in the single supine view. Cholecystectomy clips in the right upper quadrant. No radiopaque calculi. Right hip arthroplasty. Degenerative change in the lumbar spine. IMPRESSION: Mild gaseous distention of the colon, similar to recent CT. No evidence of bowel obstruction. Electronically Signed   By: Keith Rake M.D.   On: 01/25/2020 15:43   CT HEAD WO CONTRAST  Result Date: 01/16/2020 CLINICAL DATA:  Acute onset headaches and fatigue 2 weeks ago. EXAM: CT HEAD WITHOUT CONTRAST TECHNIQUE: Contiguous axial images were obtained from the base of the skull through the vertex without intravenous contrast. COMPARISON:  Head CT scan 12/01/2012. FINDINGS: Brain: No evidence of acute infarction, hemorrhage, hydrocephalus, extra-axial collection or mass lesion/mass effect. Vascular: No hyperdense vessel or unexpected calcification. Skull: Intact.  No focal lesion. Sinuses/Orbits: The patient is status post ethmoidectomy and bilateral maxillary antrostomy. Mild mucosal thickening right maxillary sinus noted. Other: None. IMPRESSION: No acute intracranial normality. Electronically Signed   By: Inge Rise M.D.   On: 01/16/2020 15:40   US RENAL  Result Date: 01/24/2020 CLINICAL DATA:  74 year old female with acute on chronic renal failure. EXAM: RENAL /  URINARY TRACT ULTRASOUND COMPLETE COMPARISON:  CT Abdomen and Pelvis without contrast 01/16/2020. FINDINGS: Right Kidney: Renal measurements: 8.8 x 5.2 x 4.5 cm = volume: 107 mL . Echogenicity within normal limits. No mass or hydronephrosis visualized. Left Kidney: Renal measurements: 10 x 4.9 x 3.6 cm = volume: 91 mL. Echogenicity within normal limits. No mass or hydronephrosis visualized. Bladder: Appears normal for degree of bladder distention. Other: Echogenic liver (image 6). IMPRESSION: 1. Normal ultrasound appearance of both kidneys and the urinary bladder. 2. Evidence of hepatic steatosis. Electronically Signed   By: Genevie Ann M.D.   On: 01/24/2020 18:47       Subjective: Patient reports feeling better. Denies abdominal pain. Has no recurrent diarrhea. She feels weak but reports that will have friends to assist her at home. [facility] RN ordered for BP medication management. She reports being able to check her BP at home. Her sister was at the bedside and updated at length.   Discharge Exam: Vitals:   01/27/20 0559 01/27/20 1410  BP: 138/70 (!) 117/59  Pulse: 86 86  Resp: 17 16  Temp: 97.8 F (36.6 C) 99 F (37.2 C)  SpO2: 100% 98%   Vitals:   01/26/20 1344 01/26/20 2135 01/27/20 0559 01/27/20  1410  BP: 134/77 129/67 138/70 (!) 117/59  Pulse: 87 89 86 86  Resp: 15 18 17 16   Temp: 97.7 F (36.5 C) 98.2 F (36.8 C) 97.8 F (36.6 C) 99 F (37.2 C)  TempSrc: Oral Oral Oral Oral  SpO2: 97% 99% 100% 98%  Weight:   90.5 kg   Height:        General: Pt is alert, awake, not in acute distress Cardiovascular: RRR, S1/S2 + Respiratory:no respiratory distress, no wheezing Abdominal: ND, soft Extremities: no edema, no cyanosis Neuro: Alert and oriented x3, able to move UE and LE voluntarily     The results of significant diagnostics from this hospitalization (including imaging, microbiology, ancillary and laboratory) are listed below for reference.     Microbiology: Recent Results  (from the past 240 hour(s))  Urine culture     Status: None   Collection Time: 01/24/20  1:48 PM   Specimen: Urine, Clean Catch  Result Value Ref Range Status   Specimen Description   Final    URINE, CLEAN CATCH Performed at [facility], [place] [address] [place], [place] [address]    Special Requests   Final    NONE Performed at [facility], [place] [address] [place], [place] [address]    Culture   Final    NO GROWTH Performed at [facility], [place] [address], [place], [place] [address]    Report Status 01/25/2020 FINAL  Final  SARS Coronavirus 2 by RT PCR (hospital order, performed in [facility] hospital lab) Nasopharyngeal Nasopharyngeal Swab     Status: None   Collection Time: 01/24/20  2:51 PM   Specimen: Nasopharyngeal Swab  Result Value Ref Range Status   SARS Coronavirus 2 NEGATIVE NEGATIVE Final    Comment: (NOTE) SARS-CoV-2 target nucleic acids are NOT DETECTED.  The SARS-CoV-2 RNA is generally detectable in upper and lower respiratory specimens during the acute phase of infection. The lowest concentration of SARS-CoV-2 viral copies this assay can detect is 250 copies / mL. A negative result does not preclude SARS-CoV-2 infection and should not be used as the sole basis for treatment or other patient management decisions.  A negative result may occur with improper specimen collection / handling, submission of specimen other than nasopharyngeal swab, presence of viral mutation(s) within the areas targeted by this assay, and inadequate number of viral copies (<250 copies / mL). A negative result must be combined with clinical observations, patient history, and epidemiological information.  Fact Sheet for Patients:   StrictlyIdeas.no  Fact Sheet for Healthcare Providers: BankingDealers.co.za  This test is not yet approved or  cleared by the [place] FDA and has been  authorized for detection and/or diagnosis of SARS-CoV-2 by FDA under an Emergency Use Authorization (EUA).  This EUA will remain in effect (meaning this test can be used) for the duration of the COVID-19 declaration under Section 564(b)(1) of the Act, 21 U.S.C. section 360bbb-3(b)(1), unless the authorization is terminated or revoked sooner.  Performed at [facility], [place] [address], [place],  [address]      Labs: BNP (last 3 results) No results for input(s): BNP in the last 8760 hours. Basic Metabolic Panel: Recent Labs  Lab 01/24/20 1103 01/25/20 0546 01/26/20 0541 01/27/20 0549  NA 137 134*  137 137 139  K 3.6 3.2*  3.3* 3.4* 3.8  CL 105 105  109 110 110  CO2 20* 17*  16* 18* 20*  GLUCOSE  112* 98  93 99 98  BUN 48* 49*  48* 35* 23  CREATININE 4.09* 3.19*  3.37* 2.20* 1.52*  CALCIUM 7.1* 7.0*  7.0* 7.6* 8.4*  MG  --  0.7* 1.5* 2.0  PHOS  --  5.5*  5.3* 4.0 3.5   Liver Function Tests: Recent Labs  Lab 01/24/20 1103 01/25/20 0546 01/26/20 0541 01/27/20 0549  AST 19 18 17 17   ALT 20 18 18 19   ALKPHOS 64 58 55 76  BILITOT 0.9 0.5 0.8 0.8  PROT 5.6* 4.8* 4.4* 5.3*  ALBUMIN 3.0* 2.5*  2.4* 2.3* 2.9*   Recent Labs  Lab 01/24/20 1103  LIPASE 40   No results for input(s): AMMONIA in the last 168 hours. CBC: Recent Labs  Lab 01/24/20 1103 01/24/20 1802 01/25/20 0546 01/26/20 0541 01/27/20 0549  WBC 7.9  --  6.2 6.2 7.0  NEUTROABS  --   --  4.7 4.3 4.6  HGB 11.0*  --  8.8* 9.1* 10.4*  HCT 31.8* 27.0* 25.9* 27.1* 31.3*  MCV 92.7  --  93.2 93.4 94.6  PLT 244  --  184 180 211   Cardiac Enzymes: Recent Labs  Lab 01/24/20 1802  CKTOTAL 64   BNP: Invalid input(s): POCBNP CBG: No results for input(s): GLUCAP in the last 168 hours. D-Dimer No results for input(s): DDIMER in the last 72 hours. Hgb A1c No results for input(s): HGBA1C in the last 72 hours. Lipid Profile Recent Labs    01/25/20 0546  CHOL 78  HDL  23*  LDLCALC 32  TRIG 113  CHOLHDL 3.4   Thyroid function studies Recent Labs    01/24/20 1802  TSH 0.309*   Anemia work up Recent Labs    01/24/20 1802  VITAMINB12 478  FERRITIN 169  TIBC 289  IRON 28   Urinalysis    Component Value Date/Time   COLORURINE YELLOW 01/24/2020 [place] 01/24/2020 1348   LABSPEC 1.012 01/24/2020 1348   PHURINE 5.0 01/24/2020 1348   GLUCOSEU NEGATIVE 01/24/2020 1348   HGBUR NEGATIVE 01/24/2020 1348   [place] 01/24/2020 1348   KETONESUR NEGATIVE 01/24/2020 1348   PROTEINUR NEGATIVE 01/24/2020 1348   UROBILINOGEN 1.0 12/07/2011 1317   NITRITE NEGATIVE 01/24/2020 1348   LEUKOCYTESUR NEGATIVE 01/24/2020 1348   Sepsis Labs Invalid input(s): PROCALCITONIN,  WBC,  LACTICIDVEN Microbiology Recent Results (from the past 240 hour(s))  Urine culture     Status: None   Collection Time: 01/24/20  1:48 PM   Specimen: Urine, Clean Catch  Result Value Ref Range Status   Specimen Description   Final    URINE, CLEAN CATCH Performed at [facility], [place] [address] [place], [place] [address]    Special Requests   Final    NONE Performed at [facility], [place] [address] [place], [place] [address]    Culture   Final    NO GROWTH Performed at [facility], [place] [address] [place], [place] [address]    Report Status 01/25/2020 FINAL  Final  SARS Coronavirus 2 by RT PCR (hospital order, performed in [facility] hospital lab) Nasopharyngeal Nasopharyngeal Swab     Status: None   Collection Time: 01/24/20  2:51 PM   Specimen: Nasopharyngeal Swab  Result Value Ref Range Status   SARS Coronavirus 2 NEGATIVE NEGATIVE Final    Comment: (NOTE) SARS-CoV-2 target nucleic acids are NOT DETECTED.  The SARS-CoV-2 RNA is generally detectable in upper and lower  respiratory specimens during the acute phase of infection. The lowest concentration of SARS-CoV-2 viral copies this assay can  detect is 250 copies / mL. A negative result does not preclude SARS-CoV-2 infection and should not be used as the sole basis for treatment or other patient management decisions.  A negative result may occur with improper specimen collection / handling, submission of specimen other than nasopharyngeal swab, presence of viral mutation(s) within the areas targeted by this assay, and inadequate number of viral copies (<250 copies / mL). A negative result must be combined with clinical observations, patient history, and epidemiological information.  Fact Sheet for Patients:   StrictlyIdeas.no  Fact Sheet for Healthcare Providers: BankingDealers.co.za  This test is not yet approved or  cleared by the [place] FDA and has been authorized for detection and/or diagnosis of SARS-CoV-2 by FDA under an Emergency Use Authorization (EUA).  This EUA will remain in effect (meaning this test can be used) for the duration of the COVID-19 declaration under Section 564(b)(1) of the Act, 21 U.S.C. section 360bbb-3(b)(1), unless the authorization is terminated or revoked sooner.  Performed at [facility], [place] [address] [place], [place] [address]      Time coordinating discharge: Over 33 minutes  SIGNED:   Blain Pais, MD  [facility] 01/27/2020, 5:04 PM   If 7PM-7AM, please contact night-coverage [email or web address] Password [email or web address]

## 2020-01-27 NOTE — TOC Initial Note (Signed)
Transition of Care [facility]) - Initial/Assessment Note    Patient Details  Name: Cassidy Garcia MRN: [id number] Date of Birth: 02/02/1946  Transition of Care [facility]) CM/SW Contact:    Joaquin Courts, RN Phone Number: 01/27/2020, 5:31 PM  Clinical Narrative:                 Patient set up with Alvis Lemmings for [facility] services.  Expected Discharge Plan: [place] Barriers to Discharge: No Barriers Identified   Patient Goals and CMS Choice Patient states their goals for this hospitalization and ongoing recovery are:: to go home CMS Medicare.gov Compare Post Acute Care list provided to:: Patient Choice offered to / list presented to : Patient  Expected Discharge Plan and Services Expected Discharge Plan: [place]   Discharge Planning Services: CM Consult Post Acute Care Choice: [place] arrangements for the past 2 months: Single Family Home Expected Discharge Date: 01/27/20               DME Arranged: N/A DME Agency: NA       HH Arranged: RN [place] Agency: [place] Date [facility] Agency Contacted: 01/27/20 Time HH Agency Contacted: 101 Representative spoke with at [place]: Tommi Rumps  Prior Living Arrangements/Services Living arrangements for the past 2 months: [place]   Patient language and need for interpreter reviewed:: Yes Do you feel safe going back to the place where you live?: Yes      Need for Family Participation in Patient Care: Yes (Comment) Care giver support system in place?: Yes (comment)   Criminal Activity/Legal Involvement Pertinent to Current Situation/Hospitalization: No - Comment as needed  Activities of Daily Living Home Assistive Devices/Equipment: Eyeglasses ADL Screening (condition at time of admission) Patient's cognitive ability adequate to safely complete daily activities?: Yes Is the patient deaf or have difficulty hearing?: No Does the patient have difficulty seeing, even when wearing  glasses/contacts?: No Does the patient have difficulty concentrating, remembering, or making decisions?: No Patient able to express need for assistance with ADLs?: Yes Does the patient have difficulty dressing or bathing?: No Independently performs ADLs?: Yes (appropriate for developmental age) Does the patient have difficulty walking or climbing stairs?: No Weakness of Legs: None Weakness of Arms/Hands: None  Permission Sought/Granted                  Emotional Assessment Appearance:: Appears stated age Attitude/Demeanor/Rapport: Engaged Affect (typically observed): Accepting Orientation: : Oriented to Self, Oriented to Place, Oriented to  Time, Oriented to Situation   Psych Involvement: No (comment)  Admission diagnosis:  Acute on chronic renal failure (HCC) [N17.9, N18.9] Acute renal failure, unspecified acute renal failure type [facility]) [N17.9] Patient Active Problem List   Diagnosis Date Noted  . Depression 01/24/2020  . Asthma 01/24/2020  . CKD (chronic kidney disease), stage III 01/24/2020  . Acute on chronic renal failure ([place]) 01/24/2020  . Pain in right hip 06/29/2017  . Trochanteric bursitis, right hip 06/29/2017  . 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 Great toe pain, left 12/04/2016  . Chronic right shoulder pain 12/04/2016  . Degenerative arthritis of hip 12/10/2011  . Chest pain 07/06/2011  . HTN (hypertension) 07/06/2011  . Hyperlipidemia 07/06/2011   PCP:  Burnard Bunting, MD Pharmacy:   [id number] [place], [place] [address] Phone: [phone] Fax: [phone]     Social Determinants of Health ([place])  Interventions    Readmission Risk Interventions No flowsheet data found.

## 2020-01-30 DIAGNOSIS — K5900 Constipation, unspecified: Secondary | ICD-10-CM | POA: Diagnosis not present

## 2020-01-30 DIAGNOSIS — K5790 Diverticulosis of intestine, part unspecified, without perforation or abscess without bleeding: Secondary | ICD-10-CM | POA: Diagnosis not present

## 2020-01-30 DIAGNOSIS — J45909 Unspecified asthma, uncomplicated: Secondary | ICD-10-CM | POA: Diagnosis not present

## 2020-01-30 DIAGNOSIS — I700 Atherosclerosis of aorta: Secondary | ICD-10-CM | POA: Diagnosis not present

## 2020-01-30 DIAGNOSIS — F329 Major depressive disorder, single episode, unspecified: Secondary | ICD-10-CM | POA: Diagnosis not present

## 2020-01-30 DIAGNOSIS — T381X5S Adverse effect of thyroid hormones and substitutes, sequela: Secondary | ICD-10-CM | POA: Diagnosis not present

## 2020-01-30 DIAGNOSIS — N1831 Chronic kidney disease, stage 3a: Secondary | ICD-10-CM | POA: Diagnosis not present

## 2020-01-30 DIAGNOSIS — Z7982 Long term (current) use of aspirin: Secondary | ICD-10-CM | POA: Diagnosis not present

## 2020-01-30 DIAGNOSIS — D631 Anemia in chronic kidney disease: Secondary | ICD-10-CM | POA: Diagnosis not present

## 2020-01-30 DIAGNOSIS — M7071 Other bursitis of hip, right hip: Secondary | ICD-10-CM | POA: Diagnosis not present

## 2020-01-30 DIAGNOSIS — K219 Gastro-esophageal reflux disease without esophagitis: Secondary | ICD-10-CM | POA: Diagnosis not present

## 2020-01-30 DIAGNOSIS — E039 Hypothyroidism, unspecified: Secondary | ICD-10-CM | POA: Diagnosis not present

## 2020-01-30 DIAGNOSIS — Z9181 History of falling: Secondary | ICD-10-CM | POA: Diagnosis not present

## 2020-01-30 DIAGNOSIS — D509 Iron deficiency anemia, unspecified: Secondary | ICD-10-CM | POA: Diagnosis not present

## 2020-01-30 DIAGNOSIS — I129 Hypertensive chronic kidney disease with stage 1 through stage 4 chronic kidney disease, or unspecified chronic kidney disease: Secondary | ICD-10-CM | POA: Diagnosis not present

## 2020-01-30 DIAGNOSIS — E785 Hyperlipidemia, unspecified: Secondary | ICD-10-CM | POA: Diagnosis not present

## 2020-01-30 DIAGNOSIS — Z602 Problems related to living alone: Secondary | ICD-10-CM | POA: Diagnosis not present

## 2020-01-30 DIAGNOSIS — E064 Drug-induced thyroiditis: Secondary | ICD-10-CM | POA: Diagnosis not present

## 2020-01-30 DIAGNOSIS — M47816 Spondylosis without myelopathy or radiculopathy, lumbar region: Secondary | ICD-10-CM | POA: Diagnosis not present

## 2020-01-30 DIAGNOSIS — N179 Acute kidney failure, unspecified: Secondary | ICD-10-CM | POA: Diagnosis not present

## 2020-02-01 DIAGNOSIS — E039 Hypothyroidism, unspecified: Secondary | ICD-10-CM | POA: Diagnosis not present

## 2020-02-01 DIAGNOSIS — I129 Hypertensive chronic kidney disease with stage 1 through stage 4 chronic kidney disease, or unspecified chronic kidney disease: Secondary | ICD-10-CM | POA: Diagnosis not present

## 2020-02-01 DIAGNOSIS — E064 Drug-induced thyroiditis: Secondary | ICD-10-CM | POA: Diagnosis not present

## 2020-02-01 DIAGNOSIS — N179 Acute kidney failure, unspecified: Secondary | ICD-10-CM | POA: Diagnosis not present

## 2020-02-01 DIAGNOSIS — N1831 Chronic kidney disease, stage 3a: Secondary | ICD-10-CM | POA: Diagnosis not present

## 2020-02-01 DIAGNOSIS — T381X5S Adverse effect of thyroid hormones and substitutes, sequela: Secondary | ICD-10-CM | POA: Diagnosis not present

## 2020-02-04 DIAGNOSIS — E039 Hypothyroidism, unspecified: Secondary | ICD-10-CM | POA: Insufficient documentation

## 2020-02-05 DIAGNOSIS — E039 Hypothyroidism, unspecified: Secondary | ICD-10-CM | POA: Diagnosis not present

## 2020-02-05 DIAGNOSIS — N1831 Chronic kidney disease, stage 3a: Secondary | ICD-10-CM | POA: Diagnosis not present

## 2020-02-05 DIAGNOSIS — I129 Hypertensive chronic kidney disease with stage 1 through stage 4 chronic kidney disease, or unspecified chronic kidney disease: Secondary | ICD-10-CM | POA: Diagnosis not present

## 2020-02-05 DIAGNOSIS — E064 Drug-induced thyroiditis: Secondary | ICD-10-CM | POA: Diagnosis not present

## 2020-02-05 DIAGNOSIS — N179 Acute kidney failure, unspecified: Secondary | ICD-10-CM | POA: Diagnosis not present

## 2020-02-05 DIAGNOSIS — T381X5S Adverse effect of thyroid hormones and substitutes, sequela: Secondary | ICD-10-CM | POA: Diagnosis not present

## 2020-02-06 DIAGNOSIS — N179 Acute kidney failure, unspecified: Secondary | ICD-10-CM | POA: Diagnosis not present

## 2020-02-06 DIAGNOSIS — I129 Hypertensive chronic kidney disease with stage 1 through stage 4 chronic kidney disease, or unspecified chronic kidney disease: Secondary | ICD-10-CM | POA: Diagnosis not present

## 2020-02-06 DIAGNOSIS — E064 Drug-induced thyroiditis: Secondary | ICD-10-CM | POA: Diagnosis not present

## 2020-02-06 DIAGNOSIS — E039 Hypothyroidism, unspecified: Secondary | ICD-10-CM | POA: Diagnosis not present

## 2020-02-06 DIAGNOSIS — N1831 Chronic kidney disease, stage 3a: Secondary | ICD-10-CM | POA: Diagnosis not present

## 2020-02-06 DIAGNOSIS — T381X5S Adverse effect of thyroid hormones and substitutes, sequela: Secondary | ICD-10-CM | POA: Diagnosis not present

## 2020-02-08 DIAGNOSIS — N1831 Chronic kidney disease, stage 3a: Secondary | ICD-10-CM | POA: Diagnosis not present

## 2020-02-08 DIAGNOSIS — N179 Acute kidney failure, unspecified: Secondary | ICD-10-CM | POA: Diagnosis not present

## 2020-02-08 DIAGNOSIS — E039 Hypothyroidism, unspecified: Secondary | ICD-10-CM | POA: Diagnosis not present

## 2020-02-08 DIAGNOSIS — E064 Drug-induced thyroiditis: Secondary | ICD-10-CM | POA: Diagnosis not present

## 2020-02-08 DIAGNOSIS — I129 Hypertensive chronic kidney disease with stage 1 through stage 4 chronic kidney disease, or unspecified chronic kidney disease: Secondary | ICD-10-CM | POA: Diagnosis not present

## 2020-02-08 DIAGNOSIS — T381X5S Adverse effect of thyroid hormones and substitutes, sequela: Secondary | ICD-10-CM | POA: Diagnosis not present

## 2020-02-11 DIAGNOSIS — N1831 Chronic kidney disease, stage 3a: Secondary | ICD-10-CM | POA: Diagnosis not present

## 2020-02-11 DIAGNOSIS — I129 Hypertensive chronic kidney disease with stage 1 through stage 4 chronic kidney disease, or unspecified chronic kidney disease: Secondary | ICD-10-CM | POA: Diagnosis not present

## 2020-02-11 DIAGNOSIS — E064 Drug-induced thyroiditis: Secondary | ICD-10-CM | POA: Diagnosis not present

## 2020-02-11 DIAGNOSIS — E039 Hypothyroidism, unspecified: Secondary | ICD-10-CM | POA: Diagnosis not present

## 2020-02-11 DIAGNOSIS — N179 Acute kidney failure, unspecified: Secondary | ICD-10-CM | POA: Diagnosis not present

## 2020-02-11 DIAGNOSIS — T381X5S Adverse effect of thyroid hormones and substitutes, sequela: Secondary | ICD-10-CM | POA: Diagnosis not present

## 2020-02-13 DIAGNOSIS — N179 Acute kidney failure, unspecified: Secondary | ICD-10-CM | POA: Diagnosis not present

## 2020-02-13 DIAGNOSIS — E064 Drug-induced thyroiditis: Secondary | ICD-10-CM | POA: Diagnosis not present

## 2020-02-13 DIAGNOSIS — E039 Hypothyroidism, unspecified: Secondary | ICD-10-CM | POA: Diagnosis not present

## 2020-02-13 DIAGNOSIS — I129 Hypertensive chronic kidney disease with stage 1 through stage 4 chronic kidney disease, or unspecified chronic kidney disease: Secondary | ICD-10-CM | POA: Diagnosis not present

## 2020-02-13 DIAGNOSIS — T381X5S Adverse effect of thyroid hormones and substitutes, sequela: Secondary | ICD-10-CM | POA: Diagnosis not present

## 2020-02-13 DIAGNOSIS — N1831 Chronic kidney disease, stage 3a: Secondary | ICD-10-CM | POA: Diagnosis not present

## 2020-02-14 DIAGNOSIS — T381X5S Adverse effect of thyroid hormones and substitutes, sequela: Secondary | ICD-10-CM | POA: Diagnosis not present

## 2020-02-14 DIAGNOSIS — N179 Acute kidney failure, unspecified: Secondary | ICD-10-CM | POA: Diagnosis not present

## 2020-02-14 DIAGNOSIS — E064 Drug-induced thyroiditis: Secondary | ICD-10-CM | POA: Diagnosis not present

## 2020-02-14 DIAGNOSIS — I129 Hypertensive chronic kidney disease with stage 1 through stage 4 chronic kidney disease, or unspecified chronic kidney disease: Secondary | ICD-10-CM | POA: Diagnosis not present

## 2020-02-14 DIAGNOSIS — N1831 Chronic kidney disease, stage 3a: Secondary | ICD-10-CM | POA: Diagnosis not present

## 2020-02-14 DIAGNOSIS — E039 Hypothyroidism, unspecified: Secondary | ICD-10-CM | POA: Diagnosis not present

## 2020-02-20 DIAGNOSIS — E064 Drug-induced thyroiditis: Secondary | ICD-10-CM | POA: Diagnosis not present

## 2020-02-20 DIAGNOSIS — T381X5S Adverse effect of thyroid hormones and substitutes, sequela: Secondary | ICD-10-CM | POA: Diagnosis not present

## 2020-02-20 DIAGNOSIS — N179 Acute kidney failure, unspecified: Secondary | ICD-10-CM | POA: Diagnosis not present

## 2020-02-20 DIAGNOSIS — I129 Hypertensive chronic kidney disease with stage 1 through stage 4 chronic kidney disease, or unspecified chronic kidney disease: Secondary | ICD-10-CM | POA: Diagnosis not present

## 2020-02-20 DIAGNOSIS — E039 Hypothyroidism, unspecified: Secondary | ICD-10-CM | POA: Diagnosis not present

## 2020-02-20 DIAGNOSIS — N1831 Chronic kidney disease, stage 3a: Secondary | ICD-10-CM | POA: Diagnosis not present

## 2020-02-25 DIAGNOSIS — E064 Drug-induced thyroiditis: Secondary | ICD-10-CM | POA: Diagnosis not present

## 2020-02-25 DIAGNOSIS — N1831 Chronic kidney disease, stage 3a: Secondary | ICD-10-CM | POA: Diagnosis not present

## 2020-02-25 DIAGNOSIS — N179 Acute kidney failure, unspecified: Secondary | ICD-10-CM | POA: Diagnosis not present

## 2020-02-25 DIAGNOSIS — E039 Hypothyroidism, unspecified: Secondary | ICD-10-CM | POA: Diagnosis not present

## 2020-02-25 DIAGNOSIS — I129 Hypertensive chronic kidney disease with stage 1 through stage 4 chronic kidney disease, or unspecified chronic kidney disease: Secondary | ICD-10-CM | POA: Diagnosis not present

## 2020-02-25 DIAGNOSIS — T381X5S Adverse effect of thyroid hormones and substitutes, sequela: Secondary | ICD-10-CM | POA: Diagnosis not present

## 2020-02-26 DIAGNOSIS — N1831 Chronic kidney disease, stage 3a: Secondary | ICD-10-CM | POA: Diagnosis not present

## 2020-02-26 DIAGNOSIS — N179 Acute kidney failure, unspecified: Secondary | ICD-10-CM | POA: Diagnosis not present

## 2020-02-26 DIAGNOSIS — T381X5S Adverse effect of thyroid hormones and substitutes, sequela: Secondary | ICD-10-CM | POA: Diagnosis not present

## 2020-02-26 DIAGNOSIS — I129 Hypertensive chronic kidney disease with stage 1 through stage 4 chronic kidney disease, or unspecified chronic kidney disease: Secondary | ICD-10-CM | POA: Diagnosis not present

## 2020-02-26 DIAGNOSIS — E064 Drug-induced thyroiditis: Secondary | ICD-10-CM | POA: Diagnosis not present

## 2020-02-26 DIAGNOSIS — E039 Hypothyroidism, unspecified: Secondary | ICD-10-CM | POA: Diagnosis not present

## 2020-02-28 DIAGNOSIS — N179 Acute kidney failure, unspecified: Secondary | ICD-10-CM | POA: Diagnosis not present

## 2020-02-28 DIAGNOSIS — I129 Hypertensive chronic kidney disease with stage 1 through stage 4 chronic kidney disease, or unspecified chronic kidney disease: Secondary | ICD-10-CM | POA: Diagnosis not present

## 2020-02-28 DIAGNOSIS — T381X5S Adverse effect of thyroid hormones and substitutes, sequela: Secondary | ICD-10-CM | POA: Diagnosis not present

## 2020-02-28 DIAGNOSIS — N1831 Chronic kidney disease, stage 3a: Secondary | ICD-10-CM | POA: Diagnosis not present

## 2020-02-28 DIAGNOSIS — E039 Hypothyroidism, unspecified: Secondary | ICD-10-CM | POA: Diagnosis not present

## 2020-02-28 DIAGNOSIS — E064 Drug-induced thyroiditis: Secondary | ICD-10-CM | POA: Diagnosis not present

## 2020-02-29 DIAGNOSIS — Z602 Problems related to living alone: Secondary | ICD-10-CM | POA: Diagnosis not present

## 2020-02-29 DIAGNOSIS — D509 Iron deficiency anemia, unspecified: Secondary | ICD-10-CM | POA: Diagnosis not present

## 2020-02-29 DIAGNOSIS — M7071 Other bursitis of hip, right hip: Secondary | ICD-10-CM | POA: Diagnosis not present

## 2020-02-29 DIAGNOSIS — J45909 Unspecified asthma, uncomplicated: Secondary | ICD-10-CM | POA: Diagnosis not present

## 2020-02-29 DIAGNOSIS — N1831 Chronic kidney disease, stage 3a: Secondary | ICD-10-CM | POA: Diagnosis not present

## 2020-02-29 DIAGNOSIS — F329 Major depressive disorder, single episode, unspecified: Secondary | ICD-10-CM | POA: Diagnosis not present

## 2020-02-29 DIAGNOSIS — K219 Gastro-esophageal reflux disease without esophagitis: Secondary | ICD-10-CM | POA: Diagnosis not present

## 2020-02-29 DIAGNOSIS — D631 Anemia in chronic kidney disease: Secondary | ICD-10-CM | POA: Diagnosis not present

## 2020-02-29 DIAGNOSIS — I700 Atherosclerosis of aorta: Secondary | ICD-10-CM | POA: Diagnosis not present

## 2020-02-29 DIAGNOSIS — E785 Hyperlipidemia, unspecified: Secondary | ICD-10-CM | POA: Diagnosis not present

## 2020-02-29 DIAGNOSIS — Z7982 Long term (current) use of aspirin: Secondary | ICD-10-CM | POA: Diagnosis not present

## 2020-02-29 DIAGNOSIS — Z9181 History of falling: Secondary | ICD-10-CM | POA: Diagnosis not present

## 2020-02-29 DIAGNOSIS — E064 Drug-induced thyroiditis: Secondary | ICD-10-CM | POA: Diagnosis not present

## 2020-02-29 DIAGNOSIS — K5900 Constipation, unspecified: Secondary | ICD-10-CM | POA: Diagnosis not present

## 2020-02-29 DIAGNOSIS — E039 Hypothyroidism, unspecified: Secondary | ICD-10-CM | POA: Diagnosis not present

## 2020-02-29 DIAGNOSIS — T381X5S Adverse effect of thyroid hormones and substitutes, sequela: Secondary | ICD-10-CM | POA: Diagnosis not present

## 2020-02-29 DIAGNOSIS — K5790 Diverticulosis of intestine, part unspecified, without perforation or abscess without bleeding: Secondary | ICD-10-CM | POA: Diagnosis not present

## 2020-02-29 DIAGNOSIS — M47816 Spondylosis without myelopathy or radiculopathy, lumbar region: Secondary | ICD-10-CM | POA: Diagnosis not present

## 2020-02-29 DIAGNOSIS — I129 Hypertensive chronic kidney disease with stage 1 through stage 4 chronic kidney disease, or unspecified chronic kidney disease: Secondary | ICD-10-CM | POA: Diagnosis not present

## 2020-02-29 DIAGNOSIS — N179 Acute kidney failure, unspecified: Secondary | ICD-10-CM | POA: Diagnosis not present

## 2020-03-03 DIAGNOSIS — E7849 Other hyperlipidemia: Secondary | ICD-10-CM | POA: Diagnosis not present

## 2020-03-03 DIAGNOSIS — E038 Other specified hypothyroidism: Secondary | ICD-10-CM | POA: Diagnosis not present

## 2020-03-04 DIAGNOSIS — N179 Acute kidney failure, unspecified: Secondary | ICD-10-CM | POA: Diagnosis not present

## 2020-03-04 DIAGNOSIS — E039 Hypothyroidism, unspecified: Secondary | ICD-10-CM | POA: Diagnosis not present

## 2020-03-04 DIAGNOSIS — N1831 Chronic kidney disease, stage 3a: Secondary | ICD-10-CM | POA: Diagnosis not present

## 2020-03-04 DIAGNOSIS — I129 Hypertensive chronic kidney disease with stage 1 through stage 4 chronic kidney disease, or unspecified chronic kidney disease: Secondary | ICD-10-CM | POA: Diagnosis not present

## 2020-03-04 DIAGNOSIS — E064 Drug-induced thyroiditis: Secondary | ICD-10-CM | POA: Diagnosis not present

## 2020-03-04 DIAGNOSIS — T381X5S Adverse effect of thyroid hormones and substitutes, sequela: Secondary | ICD-10-CM | POA: Diagnosis not present

## 2020-03-05 DIAGNOSIS — E039 Hypothyroidism, unspecified: Secondary | ICD-10-CM | POA: Diagnosis not present

## 2020-03-05 DIAGNOSIS — E064 Drug-induced thyroiditis: Secondary | ICD-10-CM | POA: Diagnosis not present

## 2020-03-05 DIAGNOSIS — N179 Acute kidney failure, unspecified: Secondary | ICD-10-CM | POA: Diagnosis not present

## 2020-03-05 DIAGNOSIS — N1831 Chronic kidney disease, stage 3a: Secondary | ICD-10-CM | POA: Diagnosis not present

## 2020-03-05 DIAGNOSIS — T381X5S Adverse effect of thyroid hormones and substitutes, sequela: Secondary | ICD-10-CM | POA: Diagnosis not present

## 2020-03-05 DIAGNOSIS — I129 Hypertensive chronic kidney disease with stage 1 through stage 4 chronic kidney disease, or unspecified chronic kidney disease: Secondary | ICD-10-CM | POA: Diagnosis not present

## 2020-03-06 DIAGNOSIS — E669 Obesity, unspecified: Secondary | ICD-10-CM | POA: Diagnosis not present

## 2020-03-06 DIAGNOSIS — K649 Unspecified hemorrhoids: Secondary | ICD-10-CM | POA: Diagnosis not present

## 2020-03-06 DIAGNOSIS — N1831 Chronic kidney disease, stage 3a: Secondary | ICD-10-CM | POA: Diagnosis not present

## 2020-03-06 DIAGNOSIS — D649 Anemia, unspecified: Secondary | ICD-10-CM | POA: Insufficient documentation

## 2020-03-06 DIAGNOSIS — M1990 Unspecified osteoarthritis, unspecified site: Secondary | ICD-10-CM | POA: Diagnosis not present

## 2020-03-06 DIAGNOSIS — M545 Low back pain: Secondary | ICD-10-CM | POA: Diagnosis not present

## 2020-03-06 DIAGNOSIS — I129 Hypertensive chronic kidney disease with stage 1 through stage 4 chronic kidney disease, or unspecified chronic kidney disease: Secondary | ICD-10-CM | POA: Diagnosis not present

## 2020-03-07 DIAGNOSIS — I129 Hypertensive chronic kidney disease with stage 1 through stage 4 chronic kidney disease, or unspecified chronic kidney disease: Secondary | ICD-10-CM | POA: Diagnosis not present

## 2020-03-07 DIAGNOSIS — N179 Acute kidney failure, unspecified: Secondary | ICD-10-CM | POA: Diagnosis not present

## 2020-03-07 DIAGNOSIS — T381X5S Adverse effect of thyroid hormones and substitutes, sequela: Secondary | ICD-10-CM | POA: Diagnosis not present

## 2020-03-07 DIAGNOSIS — E039 Hypothyroidism, unspecified: Secondary | ICD-10-CM | POA: Diagnosis not present

## 2020-03-07 DIAGNOSIS — E064 Drug-induced thyroiditis: Secondary | ICD-10-CM | POA: Diagnosis not present

## 2020-03-07 DIAGNOSIS — N1831 Chronic kidney disease, stage 3a: Secondary | ICD-10-CM | POA: Diagnosis not present

## 2020-03-11 DIAGNOSIS — E064 Drug-induced thyroiditis: Secondary | ICD-10-CM | POA: Diagnosis not present

## 2020-03-11 DIAGNOSIS — E039 Hypothyroidism, unspecified: Secondary | ICD-10-CM | POA: Diagnosis not present

## 2020-03-11 DIAGNOSIS — N1831 Chronic kidney disease, stage 3a: Secondary | ICD-10-CM | POA: Diagnosis not present

## 2020-03-11 DIAGNOSIS — T381X5S Adverse effect of thyroid hormones and substitutes, sequela: Secondary | ICD-10-CM | POA: Diagnosis not present

## 2020-03-11 DIAGNOSIS — N179 Acute kidney failure, unspecified: Secondary | ICD-10-CM | POA: Diagnosis not present

## 2020-03-11 DIAGNOSIS — I129 Hypertensive chronic kidney disease with stage 1 through stage 4 chronic kidney disease, or unspecified chronic kidney disease: Secondary | ICD-10-CM | POA: Diagnosis not present

## 2020-03-12 DIAGNOSIS — E064 Drug-induced thyroiditis: Secondary | ICD-10-CM | POA: Diagnosis not present

## 2020-03-12 DIAGNOSIS — T381X5S Adverse effect of thyroid hormones and substitutes, sequela: Secondary | ICD-10-CM | POA: Diagnosis not present

## 2020-03-12 DIAGNOSIS — N179 Acute kidney failure, unspecified: Secondary | ICD-10-CM | POA: Diagnosis not present

## 2020-03-12 DIAGNOSIS — N1831 Chronic kidney disease, stage 3a: Secondary | ICD-10-CM | POA: Diagnosis not present

## 2020-03-12 DIAGNOSIS — I129 Hypertensive chronic kidney disease with stage 1 through stage 4 chronic kidney disease, or unspecified chronic kidney disease: Secondary | ICD-10-CM | POA: Diagnosis not present

## 2020-03-12 DIAGNOSIS — E039 Hypothyroidism, unspecified: Secondary | ICD-10-CM | POA: Diagnosis not present

## 2020-03-19 DIAGNOSIS — I129 Hypertensive chronic kidney disease with stage 1 through stage 4 chronic kidney disease, or unspecified chronic kidney disease: Secondary | ICD-10-CM | POA: Diagnosis not present

## 2020-03-19 DIAGNOSIS — E039 Hypothyroidism, unspecified: Secondary | ICD-10-CM | POA: Diagnosis not present

## 2020-03-19 DIAGNOSIS — T381X5S Adverse effect of thyroid hormones and substitutes, sequela: Secondary | ICD-10-CM | POA: Diagnosis not present

## 2020-03-19 DIAGNOSIS — N179 Acute kidney failure, unspecified: Secondary | ICD-10-CM | POA: Diagnosis not present

## 2020-03-19 DIAGNOSIS — N1831 Chronic kidney disease, stage 3a: Secondary | ICD-10-CM | POA: Diagnosis not present

## 2020-03-19 DIAGNOSIS — E064 Drug-induced thyroiditis: Secondary | ICD-10-CM | POA: Diagnosis not present

## 2020-03-25 DIAGNOSIS — E064 Drug-induced thyroiditis: Secondary | ICD-10-CM | POA: Diagnosis not present

## 2020-03-25 DIAGNOSIS — E039 Hypothyroidism, unspecified: Secondary | ICD-10-CM | POA: Diagnosis not present

## 2020-03-25 DIAGNOSIS — I129 Hypertensive chronic kidney disease with stage 1 through stage 4 chronic kidney disease, or unspecified chronic kidney disease: Secondary | ICD-10-CM | POA: Diagnosis not present

## 2020-03-25 DIAGNOSIS — N179 Acute kidney failure, unspecified: Secondary | ICD-10-CM | POA: Diagnosis not present

## 2020-03-25 DIAGNOSIS — T381X5S Adverse effect of thyroid hormones and substitutes, sequela: Secondary | ICD-10-CM | POA: Diagnosis not present

## 2020-03-25 DIAGNOSIS — N1831 Chronic kidney disease, stage 3a: Secondary | ICD-10-CM | POA: Diagnosis not present

## 2020-04-17 ENCOUNTER — Ambulatory Visit (INDEPENDENT_AMBULATORY_CARE_PROVIDER_SITE_OTHER): Payer: Medicare Other | Admitting: Physician Assistant

## 2020-04-17 ENCOUNTER — Encounter: Payer: Self-pay | Admitting: Physician Assistant

## 2020-04-17 ENCOUNTER — Other Ambulatory Visit: Payer: Self-pay

## 2020-04-17 ENCOUNTER — Ambulatory Visit (INDEPENDENT_AMBULATORY_CARE_PROVIDER_SITE_OTHER): Payer: Medicare Other

## 2020-04-17 DIAGNOSIS — M79641 Pain in right hand: Secondary | ICD-10-CM

## 2020-04-17 DIAGNOSIS — S52601A Unspecified fracture of lower end of right ulna, initial encounter for closed fracture: Secondary | ICD-10-CM | POA: Diagnosis not present

## 2020-04-17 NOTE — Progress Notes (Signed)
Office Visit Note   Patient: Cassidy Garcia           Date of Birth: [birth year]           MRN: [id number] Visit Date: 04/17/2020              Requested by: Burnard Bunting, MD [address] [place],   [id number] PCP: Burnard Bunting, MD   Assessment & Plan: Visit Diagnoses:  1. Pain in right hand   2. Closed fracture of distal end of right ulna, unspecified fracture morphology, initial encounter     Plan:  We will place her in a removable Velcro wrist splint that she is to treat like a cast.  She can come out of it for hygiene purposes.  See her back in 2 weeks at that time we will obtain 3 views of the right wrist.  Questions were encouraged and answered at length.  She is encouraged to work on range of motion of the fingers and elevation of the hand.  Follow-Up Instructions: Return in about 2 weeks (around 05/01/2020) for Radiographs.   Orders:  Orders Placed This Encounter  Procedures  . XR Hand Complete Right   No orders of the defined types were placed in this encounter.     Procedures: No procedures performed   Clinical Data: No additional findings.   Subjective: Chief Complaint  Patient presents with  . Right Hand - Pain    HPI Mrs. Cassidy Garcia 74 year old female well-known to our department service comes in today due to right hand pain reports pain lateral aspect of the hand and wrist after stepping on a cat 10 days ago and following.  States with very painful for her to put pressure down on the right wrist or to turn the wrist.  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 Burney Gauze still has some pain in the thumb. Review of Systems See HPI otherwise negative or noncontributory.  Objective: Vital Signs: There were no vitals taken for this visit.  Physical Exam General: Well-developed well-nourished female no acute distress Psych: Alert and oriented x3 Ortho Exam Right hand radial pulse intact sensation grossly intact  throughout hand.  She is nontender throughout the hand tenderness over the distal ulna styloid region where there is significant edema compared to the left wrist.  Nontender over the right distal radius.  She has good range of motion of the right elbow without pain. Specialty Comments:  No specialty comments available.  Imaging: XR Hand Complete Right  Result Date: 04/17/2020 Right hand 3 views: No fractures involving the hand including carpal bones.  She has postsurgical changes of the [facility] joint of the right thumb.  No subluxations dislocations throughout the wrist carpal bones hand.  Nondisplaced fracture involving the ulnar styloid with some early consolidation.    PMFS History: Patient Active Problem List   Diagnosis Date Noted  . Depression 01/24/2020  . Asthma 01/24/2020  . CKD (chronic kidney disease), stage III 01/24/2020  . Acute on chronic renal failure ([place]) 01/24/2020  . Pain in right hip 06/29/2017  . Trochanteric bursitis, right hip 06/29/2017  . 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 Great toe pain, left 12/04/2016  . Chronic right shoulder pain 12/04/2016  . Degenerative arthritis of hip 12/10/2011  . Chest pain 07/06/2011  . HTN (hypertension) 07/06/2011  . Hyperlipidemia 07/06/2011   Past Medical History:  Diagnosis Date  . Arthritis   . Asthma   .  Bronchitis 03/2016  . Chronic kidney disease    stage 3  . Colon polyps    tubular adenoma  . Constipation   . Depression   . Diverticulosis   . GERD (gastroesophageal reflux disease)   . Hemorrhoids   . Hyperlipidemia   . Hypertension   . Hypothyroidism   . Shortness of breath    with exertion on occasion  . Sinusitis   . Skin cancer     Family History  Problem Relation Age of Onset  . Ovarian cancer Mother   . Colon cancer Neg Hx     Past Surgical History:  Procedure Laterality Date  . ABDOMINAL HYSTERECTOMY    . CHOLECYSTECTOMY    . COLONOSCOPY  02/12/2011  . FEET SURGERY    .  HAND SURGERY    . NASAL SINUS SURGERY    . POLYPECTOMY    . TOTAL HIP ARTHROPLASTY  12/10/2011   Procedure: TOTAL HIP ARTHROPLASTY ANTERIOR APPROACH;  Surgeon: Mcarthur Rossetti, MD;  Location: [facility];  Service: Orthopedics;  Laterality: Right;  Right Total Hip Arthroplasty, Anterior Approach   Social History   Occupational History    Employer: RETIRED  Tobacco Use  . Smoking status: Former Smoker    Quit date: 08/16/2001    Years since quitting: 18.6  . Smokeless tobacco: Never Used  Substance and Sexual Activity  . Alcohol use: No    Comment: rare  . Drug use: No  . Sexual activity: Not on file

## 2020-05-01 ENCOUNTER — Encounter: Payer: Self-pay | Admitting: Orthopaedic Surgery

## 2020-05-01 ENCOUNTER — Ambulatory Visit (INDEPENDENT_AMBULATORY_CARE_PROVIDER_SITE_OTHER): Payer: Medicare Other

## 2020-05-01 ENCOUNTER — Ambulatory Visit (INDEPENDENT_AMBULATORY_CARE_PROVIDER_SITE_OTHER): Payer: Medicare Other | Admitting: Orthopaedic Surgery

## 2020-05-01 ENCOUNTER — Other Ambulatory Visit: Payer: Self-pay

## 2020-05-01 DIAGNOSIS — M25531 Pain in right wrist: Secondary | ICD-10-CM | POA: Diagnosis not present

## 2020-05-01 NOTE — Progress Notes (Signed)
Patient is a very pleasant 74 year old female that we have been following for several weeks after she fell and injured her dominant right wrist.  This is the wrist where she has had a CMC arthroplasty at the base of the thumb years ago by Dr. Burney Gauze.  She had a tip of the styloid fracture that was nondisplaced.  She also has pre-existing severe arthritis of the DRUJ.  She has been wearing a Velcro wrist splint and reports some soreness and pain.  She does state that she is doing better overall.  On exam she surprisingly has full pronation and supination as well as flexion and extension of the right wrist.  There is still some pain over the ulnar styloid and dorsal hand pain in general that radiates into the middle finger.  Overall though, her clinical exam is surprisingly really good based on the significant changes she has of the DRUJ that are chronic.  There is still some pain over the tip of the ulnar styloid but it is less.  Two views of the right wrist show her chronic changes but no acute changes.  The ulnar styloid fracture looks like it is healed.  At this point she will come in and out of the splint as comfort allows.  All question concerns were answered and addressed.  Follow-up can be as needed.

## 2020-06-20 DIAGNOSIS — Z23 Encounter for immunization: Secondary | ICD-10-CM | POA: Diagnosis not present

## 2020-07-09 ENCOUNTER — Ambulatory Visit: Payer: Medicare Other

## 2020-07-17 DIAGNOSIS — E039 Hypothyroidism, unspecified: Secondary | ICD-10-CM | POA: Diagnosis not present

## 2020-07-17 DIAGNOSIS — E785 Hyperlipidemia, unspecified: Secondary | ICD-10-CM | POA: Diagnosis not present

## 2020-07-30 DIAGNOSIS — Z0000 Encounter for general adult medical examination without abnormal findings: Secondary | ICD-10-CM | POA: Diagnosis not present

## 2020-07-30 DIAGNOSIS — Z1212 Encounter for screening for malignant neoplasm of rectum: Secondary | ICD-10-CM | POA: Diagnosis not present

## 2020-07-30 DIAGNOSIS — I10 Essential (primary) hypertension: Secondary | ICD-10-CM | POA: Diagnosis not present

## 2020-07-30 DIAGNOSIS — E669 Obesity, unspecified: Secondary | ICD-10-CM | POA: Diagnosis not present

## 2020-07-30 DIAGNOSIS — N1831 Chronic kidney disease, stage 3a: Secondary | ICD-10-CM | POA: Diagnosis not present

## 2020-07-30 DIAGNOSIS — K219 Gastro-esophageal reflux disease without esophagitis: Secondary | ICD-10-CM | POA: Diagnosis not present

## 2020-07-30 DIAGNOSIS — E785 Hyperlipidemia, unspecified: Secondary | ICD-10-CM | POA: Diagnosis not present

## 2020-07-30 DIAGNOSIS — R82998 Other abnormal findings in urine: Secondary | ICD-10-CM | POA: Diagnosis not present

## 2020-07-30 DIAGNOSIS — I129 Hypertensive chronic kidney disease with stage 1 through stage 4 chronic kidney disease, or unspecified chronic kidney disease: Secondary | ICD-10-CM | POA: Diagnosis not present

## 2020-08-07 ENCOUNTER — Encounter (HOSPITAL_BASED_OUTPATIENT_CLINIC_OR_DEPARTMENT_OTHER): Payer: Self-pay | Admitting: *Deleted

## 2020-08-07 ENCOUNTER — Emergency Department (HOSPITAL_BASED_OUTPATIENT_CLINIC_OR_DEPARTMENT_OTHER)
Admission: EM | Admit: 2020-08-07 | Discharge: 2020-08-07 | Disposition: A | Discharge status: Home / Self Care | Payer: Medicare Other | Attending: Emergency Medicine | Admitting: Emergency Medicine

## 2020-08-07 ENCOUNTER — Other Ambulatory Visit: Payer: Self-pay

## 2020-08-07 DIAGNOSIS — Z7982 Long term (current) use of aspirin: Secondary | ICD-10-CM | POA: Insufficient documentation

## 2020-08-07 DIAGNOSIS — B349 Viral infection, unspecified: Secondary | ICD-10-CM | POA: Diagnosis not present

## 2020-08-07 DIAGNOSIS — Z79899 Other long term (current) drug therapy: Secondary | ICD-10-CM | POA: Diagnosis not present

## 2020-08-07 DIAGNOSIS — Z20822 Contact with and (suspected) exposure to covid-19: Secondary | ICD-10-CM | POA: Diagnosis not present

## 2020-08-07 DIAGNOSIS — E860 Dehydration: Secondary | ICD-10-CM | POA: Insufficient documentation

## 2020-08-07 DIAGNOSIS — I129 Hypertensive chronic kidney disease with stage 1 through stage 4 chronic kidney disease, or unspecified chronic kidney disease: Secondary | ICD-10-CM | POA: Diagnosis not present

## 2020-08-07 DIAGNOSIS — R197 Diarrhea, unspecified: Secondary | ICD-10-CM | POA: Diagnosis present

## 2020-08-07 DIAGNOSIS — Z87891 Personal history of nicotine dependence: Secondary | ICD-10-CM | POA: Diagnosis not present

## 2020-08-07 DIAGNOSIS — E039 Hypothyroidism, unspecified: Secondary | ICD-10-CM | POA: Insufficient documentation

## 2020-08-07 DIAGNOSIS — N183 Chronic kidney disease, stage 3 unspecified: Secondary | ICD-10-CM | POA: Diagnosis not present

## 2020-08-07 DIAGNOSIS — J45909 Unspecified asthma, uncomplicated: Secondary | ICD-10-CM | POA: Diagnosis not present

## 2020-08-07 DIAGNOSIS — Z96641 Presence of right artificial hip joint: Secondary | ICD-10-CM | POA: Diagnosis not present

## 2020-08-07 LAB — COMPREHENSIVE METABOLIC PANEL
ALT: 20 U/L (ref 0–44)
AST: 20 U/L (ref 15–41)
Albumin: 3.7 g/dL (ref 3.5–5.0)
Alkaline Phosphatase: 75 U/L (ref 38–126)
Anion gap: 12 (ref 5–15)
BUN: 24 mg/dL — ABNORMAL HIGH (ref 8–23)
CO2: 24 mmol/L (ref 22–32)
Calcium: 8.7 mg/dL — ABNORMAL LOW (ref 8.9–10.3)
Chloride: 102 mmol/L (ref 98–111)
Creatinine, Ser: 1.21 mg/dL — ABNORMAL HIGH (ref 0.44–1.00)
GFR, Estimated: 47 mL/min — ABNORMAL LOW (ref >60)
Glucose, Bld: 108 mg/dL — ABNORMAL HIGH (ref 70–99)
Potassium: 3.3 mmol/L — ABNORMAL LOW (ref 3.5–5.1)
Sodium: 138 mmol/L (ref 135–145)
Total Bilirubin: 0.8 mg/dL (ref 0.3–1.2)
Total Protein: 6.2 g/dL — ABNORMAL LOW (ref 6.5–8.1)

## 2020-08-07 LAB — CBC
HCT: 36.6 % (ref 36.0–46.0)
Hemoglobin: 12.9 g/dL (ref 12.0–15.0)
MCH: 32.3 pg (ref 26.0–34.0)
MCHC: 35.2 g/dL (ref 30.0–36.0)
MCV: 91.5 fL (ref 80.0–100.0)
Platelets: 224 10*3/uL (ref 150–400)
RBC: 4 MIL/uL (ref 3.87–5.11)
RDW: 12.1 % (ref 11.5–15.5)
WBC: 6.5 10*3/uL (ref 4.0–10.5)
nRBC: 0 % (ref 0.0–0.2)

## 2020-08-07 LAB — URINALYSIS, ROUTINE W REFLEX MICROSCOPIC
Bilirubin Urine: NEGATIVE
Glucose, UA: NEGATIVE mg/dL
Hgb urine dipstick: NEGATIVE
Ketones, ur: NEGATIVE mg/dL
Leukocytes,Ua: NEGATIVE
Nitrite: NEGATIVE
Protein, ur: NEGATIVE mg/dL
Specific Gravity, Urine: 1.015 (ref 1.005–1.030)
pH: 6 (ref 5.0–8.0)

## 2020-08-07 LAB — LIPASE, BLOOD: Lipase: 20 U/L (ref 11–51)

## 2020-08-07 LAB — RESP PANEL BY RT-PCR (FLU A&B, COVID) ARPGX2
Influenza A by PCR: NEGATIVE
Influenza B by PCR: NEGATIVE
SARS Coronavirus 2 by RT PCR: NEGATIVE

## 2020-08-07 MED ORDER — ONDANSETRON 4 MG PO TBDP: 4.0000 mg | ORAL_TABLET | Freq: Three times a day (TID) | ORAL | 0 refills | Status: DC | PRN

## 2020-08-07 NOTE — ED Notes (Signed)
Unable to locate pt for VS reassessment. She had gone to bistro to get snack from machine.

## 2020-08-07 NOTE — ED Triage Notes (Signed)
Vomiting and diarrhea. Hx of renal failure resolved by rehydration.

## 2020-08-07 NOTE — ED Provider Notes (Signed)
[facility] EMERGENCY DEPARTMENT Provider Note   CSN: [id number] Arrival date & time: 08/07/20  1519     History Chief Complaint  Patient presents with  . Emesis  . Diarrhea    Cassidy Garcia is a 74 y.o. female.  Presents to ER with concern for area.  She reports that she is been feeling under the weather for the past few days or so.  Has had generalized fatigue, malaise as well as nausea and intermittent bouts of vomiting.  Also has noted a few episodes of loose stool.  Vomit and stool has all been nonbloody.  No associated abdominal pain.  She currently does not have any significant nausea.  She has been able to eat and drink without difficulty.  She states that her symptoms today were not particularly severe and that she was principally concerned with monitoring of her kidney function due to experiencing similar symptoms in June which resulted in an episode of acute kidney injury.  Per chart review 6/13 patient admitted for significant dehydration, elevation of creatinine to 4 in the setting of a viral GI illness.  Patient reports her creatinine baseline and her primary care office has been1.5-1.6  HPI     Past Medical History:  Diagnosis Date  . Arthritis   . Asthma   . Bronchitis 03/2016  . Chronic kidney disease    stage 3  . Colon polyps    tubular adenoma  . Constipation   . Depression   . Diverticulosis   . GERD (gastroesophageal reflux disease)   . Hemorrhoids   . Hyperlipidemia   . Hypertension   . Hypothyroidism   . Shortness of breath    with exertion on occasion  . Sinusitis   . Skin cancer     Patient Active Problem List   Diagnosis Date Noted  . Depression 01/24/2020  . Asthma 01/24/2020  . CKD (chronic kidney disease), stage III ([place]) 01/24/2020  . Acute on chronic renal failure ([place]) 01/24/2020  . Pain in right hip 06/29/2017  . Trochanteric bursitis, right hip 06/29/2017  . 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 Great  toe pain, left 12/04/2016  . Chronic right shoulder pain 12/04/2016  . Degenerative arthritis of hip 12/10/2011  . Chest pain 07/06/2011  . HTN (hypertension) 07/06/2011  . Hyperlipidemia 07/06/2011    Past Surgical History:  Procedure Laterality Date  . ABDOMINAL HYSTERECTOMY    . CHOLECYSTECTOMY    . COLONOSCOPY  02/12/2011  . FEET SURGERY    . HAND SURGERY    . NASAL SINUS SURGERY    . POLYPECTOMY    . TOTAL HIP ARTHROPLASTY  12/10/2011   Procedure: TOTAL HIP ARTHROPLASTY ANTERIOR APPROACH;  Surgeon: Mcarthur Rossetti, MD;  Location: [facility];  Service: Orthopedics;  Laterality: Right;  Right Total Hip Arthroplasty, Anterior Approach     OB History   No obstetric history on file.     Family History  Problem Relation Age of Onset  . Ovarian cancer Mother   . Colon cancer Neg Hx     Social History   Tobacco Use  . Smoking status: Former Smoker    Quit date: 08/16/2001    Years since quitting: 18.9  . Smokeless tobacco: Never Used  Substance Use Topics  . Alcohol use: No    Comment: rare  . Drug use: No    Home Medications Prior to Admission medications   Medication Sig Start Date End Date Taking?  Authorizing Provider  acetaminophen (TYLENOL) 500 MG tablet Take 1,000 mg by mouth every 6 (six) hours as needed for moderate pain.    [provider]  albuterol (VENTOLIN HFA) 108 (90 BASE) MCG/ACT inhaler Inhale 2 puffs into the lungs every 6 (six) hours as needed for wheezing or shortness of breath.     [provider]  amitriptyline (ELAVIL) 25 MG tablet Take 75 mg by mouth at bedtime.  01/22/11   [provider]  aspirin 81 MG tablet Take 81 mg by mouth daily with breakfast.     [provider]  atorvastatin (LIPITOR) 40 MG tablet Take 40 mg by mouth daily.    [provider]  cholecalciferol (VITAMIN D3) 25 MCG (1000 UNIT) tablet Take 1,000 Units by mouth daily.    [provider]  levothyroxine (SYNTHROID) 25 MCG  tablet Take 1 tablet (25 mcg total) by mouth daily before breakfast. 01/27/20   Blain Pais, MD  losartan-hydrochlorothiazide (HYZAAR) 100-25 MG tablet Take 1 tablet by mouth daily. 03/31/20   [provider]  Multiple Vitamin (MULTIVITAMIN) tablet Take 1 tablet by mouth daily.    [provider]  Omega-3 Fatty Acids (FISH OIL) 1200 MG CAPS Take 1 capsule by mouth 2 (two) times daily.     [provider]  omeprazole (PRILOSEC) 20 MG capsule Take 20 mg by mouth daily with breakfast.     [provider]  ondansetron (ZOFRAN-ODT) 4 MG disintegrating tablet Take 1 tablet (4 mg total) by mouth every 8 (eight) hours as needed for nausea or vomiting. 08/07/20   Lucrezia Starch, MD  verapamil (CALAN-SR) 240 MG CR tablet Take 240 mg by mouth at bedtime.     [provider]    Allergies    Codeine, Demerol [meperidine hcl], and Phenergan [promethazine]  Review of Systems   Review of Systems  Constitutional: Positive for chills and fatigue. Negative for fever.  HENT: Negative for ear pain and sore throat.   Eyes: Negative for pain and visual disturbance.  Respiratory: Negative for cough and shortness of breath.   Cardiovascular: Negative for chest pain and palpitations.  Gastrointestinal: Positive for diarrhea, nausea and vomiting. Negative for abdominal pain.  Genitourinary: Negative for dysuria and hematuria.  Musculoskeletal: Negative for arthralgias and back pain.  Skin: Negative for color change and rash.  Neurological: Negative for seizures and syncope.  All other systems reviewed and are negative.   Physical Exam Updated Vital Signs BP 140/85 (BP Location: Right Arm)   Pulse 89   Temp 98.2 F (36.8 C) (Oral)   Resp 16   Ht 5\' 5"  (1.651 m)   Wt 93 kg   SpO2 98%   BMI 34.11 kg/m   Physical Exam Vitals and nursing note reviewed.  Constitutional:      General: She is not in acute distress.    Appearance: She is well-developed  and well-nourished.  HENT:     Head: Normocephalic and atraumatic.  Eyes:     Conjunctiva/sclera: Conjunctivae normal.  Cardiovascular:     Rate and Rhythm: Normal rate and regular rhythm.     Heart sounds: No murmur heard.   Pulmonary:     Effort: Pulmonary effort is normal. No respiratory distress.     Breath sounds: Normal breath sounds.  Abdominal:     Palpations: Abdomen is soft.     Tenderness: There is no abdominal tenderness.  Musculoskeletal:        General: No edema.  Cervical back: Neck supple.  Skin:    General: Skin is warm and dry.  Neurological:     General: No focal deficit present.     Mental Status: She is alert.  Psychiatric:        Mood and Affect: Mood and affect and mood normal.        Behavior: Behavior normal.     ED Results / Procedures / Treatments   Labs (all labs ordered are listed, but only abnormal results are displayed) Labs Reviewed  COMPREHENSIVE METABOLIC PANEL - Abnormal; Notable for the following components:      Result Value   Potassium 3.3 (*)    Glucose, Bld 108 (*)    BUN 24 (*)    Creatinine, Ser 1.21 (*)    Calcium 8.7 (*)    Total Protein 6.2 (*)    GFR, Estimated 47 (*)    All other components within normal limits  RESP PANEL BY RT-PCR (FLU A&B, COVID) ARPGX2  LIPASE, BLOOD  CBC  URINALYSIS, ROUTINE W REFLEX MICROSCOPIC    EKG None  Radiology No results found.  Procedures Procedures (including critical care time)  Medications Ordered in ED Medications - No data to display  ED Course  I have reviewed the triage vital signs and the nursing notes.  Pertinent labs & imaging results that were available during my care of the patient were reviewed by me and considered in my medical decision making (see chart for details).    MDM Rules/Calculators/A&P                         74 year old lady presents to the emergency room with concern for dehydration, nausea, vomiting and diarrhea.  On exam patient is noted  to be remarkably well-appearing, her abdomen is soft.  Based on symptomatology, concern for viral GI illness.  Her lab work was grossly normal.  Creatinine 1.2 which based on review of chart this appears at baseline.  Based on work-up and exam, low suspicion for acute abdominal process.  Given her symptoms, offered fluids, antiemetics and further observation in the emergency room.  Patient states that she actually has no symptoms at present and was principally concerned with getting her blood work checked.  She is tolerating p.o. without any difficulty.  Will send for Covid testing.  Will discharge home, recommended recheck with her primary doctor next week and she should return to ER if she has worsening symptoms in the interim.  After the discussed management above, the patient was determined to be safe for discharge.  The patient was in agreement with this plan and all questions regarding their care were answered.  ED return precautions were discussed and the patient will return to the ED with any significant worsening of condition.  Final Clinical Impression(s) / ED Diagnoses Final diagnoses:  Viral syndrome  Mild dehydration    Rx / DC Orders ED Discharge Orders         Ordered    ondansetron (ZOFRAN-ODT) 4 MG disintegrating tablet  Every 8 hours PRN        08/07/20 2040           Lucrezia Starch, MD 08/08/20 1219

## 2020-08-07 NOTE — ED Notes (Signed)
Discharge instructions discussed. Patient verbalized understanding. Departs ED at this time in stable condition.

## 2020-08-07 NOTE — Discharge Instructions (Signed)
Please follow-up with your primary care doctor for close recheck early next week.  Stay hydrated with lots of fluids.  Take Zofran as needed for nausea.  If you develop fever or worsening vomiting, headaches, abdominal pain or other new concerning symptoms return to ER for reassessment.

## 2020-08-14 DIAGNOSIS — E039 Hypothyroidism, unspecified: Secondary | ICD-10-CM | POA: Diagnosis not present

## 2020-08-14 DIAGNOSIS — I10 Essential (primary) hypertension: Secondary | ICD-10-CM | POA: Diagnosis not present

## 2020-08-14 DIAGNOSIS — I129 Hypertensive chronic kidney disease with stage 1 through stage 4 chronic kidney disease, or unspecified chronic kidney disease: Secondary | ICD-10-CM | POA: Diagnosis not present

## 2020-08-14 DIAGNOSIS — K529 Noninfective gastroenteritis and colitis, unspecified: Secondary | ICD-10-CM | POA: Diagnosis not present

## 2020-08-14 DIAGNOSIS — N1831 Chronic kidney disease, stage 3a: Secondary | ICD-10-CM | POA: Diagnosis not present

## 2020-08-14 DIAGNOSIS — E860 Dehydration: Secondary | ICD-10-CM | POA: Diagnosis not present

## 2020-08-14 DIAGNOSIS — K219 Gastro-esophageal reflux disease without esophagitis: Secondary | ICD-10-CM | POA: Diagnosis not present

## 2020-08-22 ENCOUNTER — Encounter: Payer: Self-pay | Admitting: Nurse Practitioner

## 2020-09-09 ENCOUNTER — Other Ambulatory Visit (INDEPENDENT_AMBULATORY_CARE_PROVIDER_SITE_OTHER): Payer: Medicare Other

## 2020-09-09 ENCOUNTER — Encounter: Payer: Self-pay | Admitting: Nurse Practitioner

## 2020-09-09 ENCOUNTER — Ambulatory Visit (INDEPENDENT_AMBULATORY_CARE_PROVIDER_SITE_OTHER): Payer: Medicare Other | Admitting: Nurse Practitioner

## 2020-09-09 VITALS — BP 164/80 | HR 104 | Ht 63.25 in | Wt 202.5 lb

## 2020-09-09 DIAGNOSIS — R112 Nausea with vomiting, unspecified: Secondary | ICD-10-CM

## 2020-09-09 DIAGNOSIS — K529 Noninfective gastroenteritis and colitis, unspecified: Secondary | ICD-10-CM

## 2020-09-09 LAB — BASIC METABOLIC PANEL
BUN: 17 mg/dL (ref 6–23)
CO2: 24 mEq/L (ref 19–32)
Calcium: 8.8 mg/dL (ref 8.4–10.5)
Chloride: 108 mEq/L (ref 96–112)
Creatinine, Ser: 0.83 mg/dL (ref 0.40–1.20)
GFR: 69.06 mL/min (ref >60.00)
Glucose, Bld: 102 mg/dL — ABNORMAL HIGH (ref 70–99)
Potassium: 3.6 mEq/L (ref 3.5–5.1)
Sodium: 140 mEq/L (ref 135–145)

## 2020-09-09 NOTE — Patient Instructions (Signed)
If you are age 76 or older, your body mass index should be between 23-30. Your Body mass index is 35.59 kg/m. If this is out of the aforementioned range listed, please consider follow up with your Primary Care Provider.  If you are age 63 or younger, your body mass index should be between 19-25. Your Body mass index is 35.59 kg/m. If this is out of the aformentioned range listed, please consider follow up with your Primary Care Provider.   LABS: Your provider has requested that you go to the basement level for lab work before leaving today. Press "B" on the elevator. The lab is located at the first door on the left as you exit the elevator.  HEALTHCARE LAWS AND MY CHART RESULTS: Due to recent changes in healthcare laws, you may see the results of your imaging and laboratory studies on MyChart before your provider has had a chance to review them.  We understand that in some cases there may be results that are confusing or concerning to you. Not all laboratory results come back in the same time frame and the provider may be waiting for multiple results in order to interpret others.  Please give [place] 48 hours in order for your provider to thoroughly review all the results before contacting the office for clarification of your results.   Please take: Ondansetron (ZOFRAN ODT) 4 MG disintegrating tablet-     Place one tablet under the tongue every 6-8 hours as needed for nausea or vomiting.  Any probiotic of your choice.  Please call our office if your symptoms worsen.   It was great seeing you today!  Thank you for entrusting me with your care and choosing [facility]  [name], CRNP

## 2020-09-09 NOTE — Progress Notes (Signed)
09/09/2020 Cassidy Garcia [id number] 1946/06/26   CHIEF COMPLAINT: Recent N/V/D illness   HISTORY OF PRESENT ILLNESS:  Cassidy Garcia is a 75 year old female with a past medical history of depression, arthritis, asthma, hypertension, hyperlipidemia, CKD stage 3, GERD and colon polyps. Past cataract surgery,  hysterectomy and cholecystectomy.   She presents to our office today as referred by Reginold Agent NP for further evaluation regarding N/V and  Diarrhea which started 08/01/2020 approximately to 2 to 3 hours after she ate egg phoo young. She vomited x 3. No hematemesis. She went to [place] on 08/07/2020 due to persistent fatigue and she was concerned that she was dehydrated with concerns of AKI. She wanted her labs checked (she was admitted to the hospital 01/27/2020 with N/V/D and AKI).    Labs in the ED 08/07/2020: Na 138. K 3.3. BUN 24. Cr. 1.21. Alk phos 3.7. AST 20. ALT 20. T. Bili 0.8. WBC 6.5. Hg 12.9. HCT 36.6. PLT 224. SARS Corona virus 2 negative. Her exam was unremarkable and she was discharged home.   She continued to have loose nonbloody stools a few times daily and her nausea persisted and she vomited again on 12/31 x 2, 1/3/ x 2 and 08/22/2020 x 2 episodes. No further vomiting since then. Diarrhea stopped around 08/27/2020. Infrequent small amounts of bright red blood on the toilet tissue. She took Pepto and Zofran as needed. She felt fatigued with chills for the next 2 weeks. Currently, she is feeling much better. No nausea for a few days. She is passing a normal formed BM once daily. No weight loss. GERD is well controlled on Omeprazole 64m QD.   As noted above, she was admitted to the hospital 01/24/2020 with fatigue, N/V and diarrhea. Labs in he ED showed AKI on CKD with a creatinine level of 4.09. She received IV fluids and her Cr level improved to 1.5. She was seen by nephrology, previously on Diclofenac and they advised no NSAIDs. CTAP was negative for any  acute intra abdominal/pelvic pathology. No diarrhea during this hospital admission therefore GI pathogen panel and C. Diff testing was not done. She was discharged home on 01/27/2020.   Colonoscopy 08/19/2016 Dr. PHenrene Pastor  - One 1 mm polyp in the proximal transverse colon, removed with a cold snare. Resected. - The examination was otherwise normal on direct and retroflexion views. - No meaningful tissue available for pathologic submission. - No further colonoscopies recommended due to age   Past Medical History:  Diagnosis Date  . Arthritis   . Asthma   . Bronchitis 03/2016  . Chronic kidney disease    stage 3  . Colon polyps    tubular adenoma  . Constipation   . Depression   . Diverticulosis   . GERD (gastroesophageal reflux disease)   . Hemorrhoids   . Hyperlipidemia   . Hypertension   . Hypothyroidism   . Shortness of breath    with exertion on occasion  . Sinusitis   . Skin cancer    Past Surgical History:  Procedure Laterality Date  . ABDOMINAL HYSTERECTOMY    . CARPOMETACARPAL (CMC) FUSION OF THUMB Bilateral   . CATARACT EXTRACTION, BILATERAL    . CHOLECYSTECTOMY    . COLONOSCOPY  02/12/2011  . HAMMER TOE SURGERY Right   . NASAL SINUS SURGERY    . POLYPECTOMY    . TOTAL HIP ARTHROPLASTY  12/10/2011   Procedure: TOTAL HIP ARTHROPLASTY ANTERIOR APPROACH;  Surgeon: Mcarthur Rossetti, MD;  Location: [facility];  Service: Orthopedics;  Laterality: Right;  Right Total Hip Arthroplasty, Anterior Approach   Social History:  She is widowed. She is retired. Past smoker, she quit smoking 19 years ago. No alcohol or drug use.   Family History: Mother with history of ovarian cancer.   Allergies  Allergen Reactions  . Codeine Nausea And Vomiting    REACTION: Lightheaded, nauses  . Demerol [Meperidine Hcl] Other (See Comments)    Dizziness   . Phenergan [Promethazine] Other (See Comments)    Feeling like heart attack      Outpatient Encounter Medications as of 09/09/2020   Medication Sig  . acetaminophen (TYLENOL) 500 MG tablet Take 1,000 mg by mouth every 6 (six) hours as needed for moderate pain.  Marland Kitchen albuterol (VENTOLIN HFA) 108 (90 Base) MCG/ACT inhaler Inhale 2 puffs into the lungs every 6 (six) hours as needed for wheezing or shortness of breath.   Marland Kitchen amitriptyline (ELAVIL) 25 MG tablet Take 75 mg by mouth at bedtime.   Marland Kitchen aspirin 81 MG tablet Take 81 mg by mouth daily with breakfast.  . cholecalciferol (VITAMIN D3) 25 MCG (1000 UNIT) tablet Take 1,000 Units by mouth daily.  . diclofenac (VOLTAREN) 75 MG EC tablet Take 75 mg by mouth 2 (two) times daily.  Marland Kitchen levothyroxine (SYNTHROID) 25 MCG tablet Take 1 tablet (25 mcg total) by mouth daily before breakfast.  . mometasone (NASONEX) 50 MCG/ACT nasal spray Place 2 sprays into the nose as needed.  . Multiple Vitamin (MULTIVITAMIN) tablet Take 1 tablet by mouth daily.  . Omega-3 Fatty Acids (FISH OIL) 1200 MG CAPS Take 1 capsule by mouth 2 (two) times daily.  Marland Kitchen omeprazole (PRILOSEC) 20 MG capsule Take 20 mg by mouth daily with breakfast.  . ondansetron (ZOFRAN-ODT) 4 MG disintegrating tablet Take 1 tablet (4 mg total) by mouth every 8 (eight) hours as needed for nausea or vomiting.  . traMADol (ULTRAM) 50 MG tablet Take by mouth as needed.  . verapamil (CALAN-SR) 240 MG CR tablet Take 240 mg by mouth at bedtime.  . VOLTAREN 1 % GEL Apply 2 g topically as needed.  Marland Kitchen atorvastatin (LIPITOR) 40 MG tablet Take 40 mg by mouth daily. (Patient not taking: Reported on 09/09/2020)  . losartan-hydrochlorothiazide (HYZAAR) 100-25 MG tablet Take 1 tablet by mouth daily. (Patient not taking: Reported on 09/09/2020)   No facility-administered encounter medications on file as of 09/09/2020.     REVIEW OF SYSTEMS:  Gen: See HPI. No weight loss.  CV: Denies chest pain, palpitations or edema. Resp: Denies cough, shortness of breath of hemoptysis.  GI: See HPI.  GU : Minor urinary leakage.  MS: Arthritis, muscle cramps less after  taking Magnesium.  Derm: Denies rash, itchiness, skin lesions or unhealing ulcers. Psych: Denies depression, anxiety or memory loss.  Heme: Denies bruising, bleeding. Neuro:  Denies headaches, dizziness or paresthesias. Endo:  Denies any problems with DM, thyroid or adrenal function.   PHYSICAL EXAM: BP (!) 164/80 (BP Location: Left Arm, Patient Position: Sitting, Cuff Size: Normal)   Pulse (!) 104   Ht 5' 3.25" (1.607 m) Comment: height measured without shoes  Wt 202 lb 8 oz (91.9 kg)   BMI 35.59 kg/m   Wt Readings from Last 3 Encounters:  09/09/20 202 lb 8 oz (91.9 kg)  08/07/20 205 lb (93 kg)  01/27/20 199 lb 8.3 oz (90.5 kg)   General: Well developed 75 year old female in no  acute distress. Head: Normocephalic and atraumatic. Eyes:  Sclerae non-icteric, conjunctive pink. Ears: Normal auditory acuity. Mouth: Dentition intact. No ulcers or lesions.  Neck: Supple, no lymphadenopathy or thyromegaly.  Lungs: Clear bilaterally to auscultation without wheezes, crackles or rhonchi. Heart: Regular rate and rhythm. No murmur, rub or gallop appreciated.  Abdomen: Soft, nontender, non distended. No masses. No hepatosplenomegaly. Normoactive bowel sounds x 4 quadrants.  Rectal: Deferred.  Musculoskeletal: Symmetrical with no gross deformities. Skin: Warm and dry. No rash or lesions on visible extremities. Extremities: No edema. Neurological: Alert oriented x 4, no focal deficits.  Psychological:  Alert and cooperative. Normal mood and affect.  ASSESSMENT AND PLAN:  61. 75 year old female with N/V/D 08/01/2020, prolonged course of gastroenteritis which was triggered a few hours after eating egg phoo young.  She was admitted to the hospital 01/2020 x 3 days with N/V/D and AKI which resolved. CTAP without contrast during this hospital admission was negative.  -Discussed probiotic of choice once daily to restore her colon bacteria flora  -Patient to call our office if N/V or diarrhea recurs   -Diet as tolerated. Push fluids, avoid dehydration. Avoid fatty foods.  -BMP -Follow up as needed.   2. GERD, stable.  -Continue Omeprazole 53m QD  3. History of tubular adenomatous colon polyps. Last colonoscopy 08/2016 one 141mpolyp was removed from the colon but tissue was not sufficient for biopsy.  -No further colonoscopies recommended unless medically necessary     CC:  ArBurnard BuntingMD

## 2020-09-10 LAB — TISSUE TRANSGLUTAMINASE ABS,IGG,IGA
(tTG) Ab, IgA: <1 U/mL
(tTG) Ab, IgG: <1 U/mL

## 2020-09-10 LAB — IGA: Immunoglobulin A: 103 mg/dL (ref 70–320)

## 2020-09-10 NOTE — Progress Notes (Signed)
Assessment and plan reviewed 

## 2020-10-13 DIAGNOSIS — R5383 Other fatigue: Secondary | ICD-10-CM | POA: Diagnosis not present

## 2020-10-13 DIAGNOSIS — D649 Anemia, unspecified: Secondary | ICD-10-CM | POA: Diagnosis not present

## 2020-10-13 DIAGNOSIS — R7989 Other specified abnormal findings of blood chemistry: Secondary | ICD-10-CM | POA: Diagnosis not present

## 2020-10-13 DIAGNOSIS — R5381 Other malaise: Secondary | ICD-10-CM | POA: Diagnosis not present

## 2020-10-13 DIAGNOSIS — I129 Hypertensive chronic kidney disease with stage 1 through stage 4 chronic kidney disease, or unspecified chronic kidney disease: Secondary | ICD-10-CM | POA: Diagnosis not present

## 2020-10-13 DIAGNOSIS — E039 Hypothyroidism, unspecified: Secondary | ICD-10-CM | POA: Diagnosis not present

## 2020-10-13 DIAGNOSIS — N1831 Chronic kidney disease, stage 3a: Secondary | ICD-10-CM | POA: Diagnosis not present

## 2020-10-13 DIAGNOSIS — R634 Abnormal weight loss: Secondary | ICD-10-CM | POA: Diagnosis not present

## 2020-10-13 LAB — CBC: RBC: 3.8 — AB (ref 3.87–5.11)

## 2020-10-13 LAB — COMPREHENSIVE METABOLIC PANEL
Albumin: 3.2 — AB (ref 3.5–5.0)
Calcium: 7.9 — AB (ref 8.7–10.7)
GFR calc Af Amer: 44.4
GFR calc non Af Amer: 36.7

## 2020-10-13 LAB — BASIC METABOLIC PANEL
BUN: 26 — AB (ref 4–21)
CO2: 21 (ref 13–22)
Chloride: 106 (ref 99–108)
Creatinine: 1.4 — AB (ref 0.5–1.1)
Glucose: 104
Potassium: 3.8 (ref 3.4–5.3)
Sodium: 141 (ref 137–147)

## 2020-10-13 LAB — CBC AND DIFFERENTIAL
HCT: 34 — AB (ref 36–46)
Hemoglobin: 11.4 — AB (ref 12.0–16.0)
WBC: 8.9

## 2020-10-13 LAB — IRON,TIBC AND FERRITIN PANEL
%SAT: 21
Ferritin: 429
Iron: 54
TIBC: 255
UIBC: 201

## 2020-10-13 LAB — HEPATIC FUNCTION PANEL
ALT: 21 (ref 7–35)
AST: 19 (ref 13–35)
Alkaline Phosphatase: 171 — AB (ref 25–125)
Bilirubin, Total: 0.8

## 2020-10-13 LAB — POCT ERYTHROCYTE SEDIMENTATION RATE, NON-AUTOMATED: Sed Rate: 60

## 2020-10-15 ENCOUNTER — Other Ambulatory Visit: Payer: Self-pay | Admitting: Internal Medicine

## 2020-10-15 ENCOUNTER — Other Ambulatory Visit (HOSPITAL_COMMUNITY): Payer: Self-pay | Admitting: Internal Medicine

## 2020-10-15 ENCOUNTER — Ambulatory Visit
Admission: RE | Admit: 2020-10-15 | Discharge: 2020-10-15 | Disposition: A | Discharge status: Home / Self Care | Payer: Medicare Other | Source: Ambulatory Visit | Attending: Internal Medicine | Admitting: Internal Medicine

## 2020-10-15 DIAGNOSIS — D649 Anemia, unspecified: Secondary | ICD-10-CM

## 2020-10-15 DIAGNOSIS — R5381 Other malaise: Secondary | ICD-10-CM

## 2020-10-15 DIAGNOSIS — R634 Abnormal weight loss: Secondary | ICD-10-CM

## 2020-10-15 DIAGNOSIS — R5383 Other fatigue: Secondary | ICD-10-CM | POA: Diagnosis not present

## 2020-10-15 DIAGNOSIS — I700 Atherosclerosis of aorta: Secondary | ICD-10-CM | POA: Diagnosis not present

## 2020-10-15 DIAGNOSIS — R42 Dizziness and giddiness: Secondary | ICD-10-CM

## 2020-10-15 DIAGNOSIS — K3189 Other diseases of stomach and duodenum: Secondary | ICD-10-CM | POA: Diagnosis not present

## 2020-10-15 DIAGNOSIS — R110 Nausea: Secondary | ICD-10-CM

## 2020-10-15 IMAGING — CT CT ABD-PELV W/ CM
2 of 5 series · 11 of 46 positions shown, 12 images · IV contrast (iopamidol)
Comparison: [DATE]

CLINICAL DATA: Unintentional weight loss, fatigue

EXAM:
CT ABDOMEN AND PELVIS WITH CONTRAST
TECHNIQUE: Multidetector CT imaging of the abdomen and pelvis was performed
using the standard protocol following bolus administration of
intravenous contrast.
CONTRAST:  80mL [ZX] IOPAMIDOL ([ZX]) INJECTION 61%

[Series 2: abd pelvis 5.00 br40 s3 axial · axial · 0.61mm/px · z∈[+1234,+1584]mm · 8 of 91 slices shown, 9 images]
[im 11/91  soft-tissue]
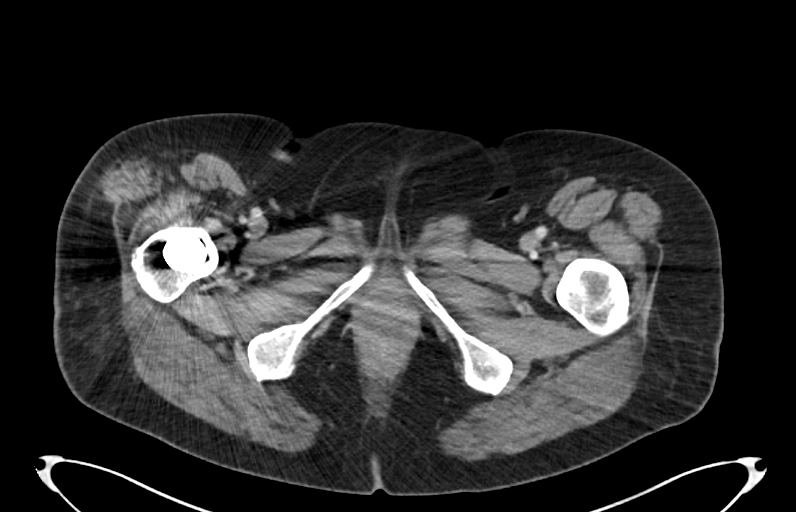
[im 11/91  bone]
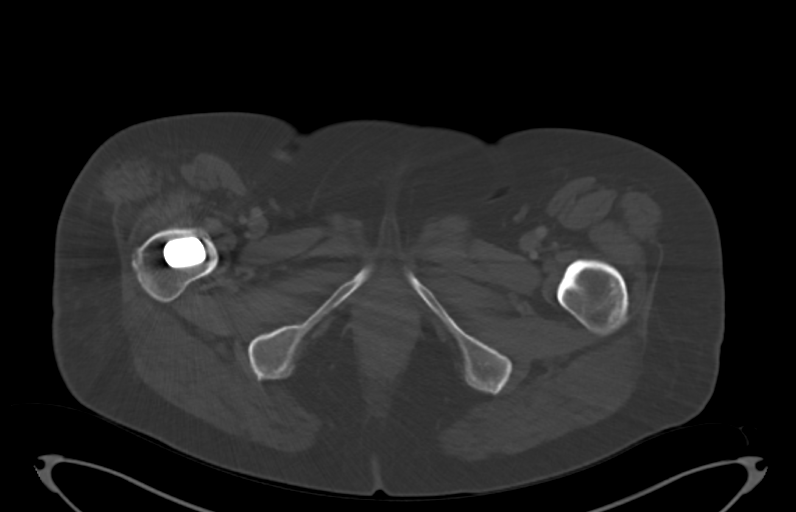
[im 21/91  soft-tissue]
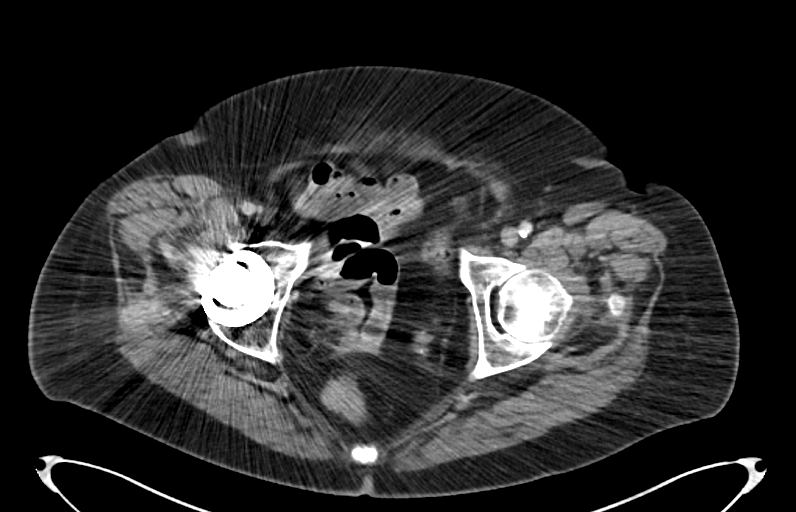
[im 31/91  soft-tissue]
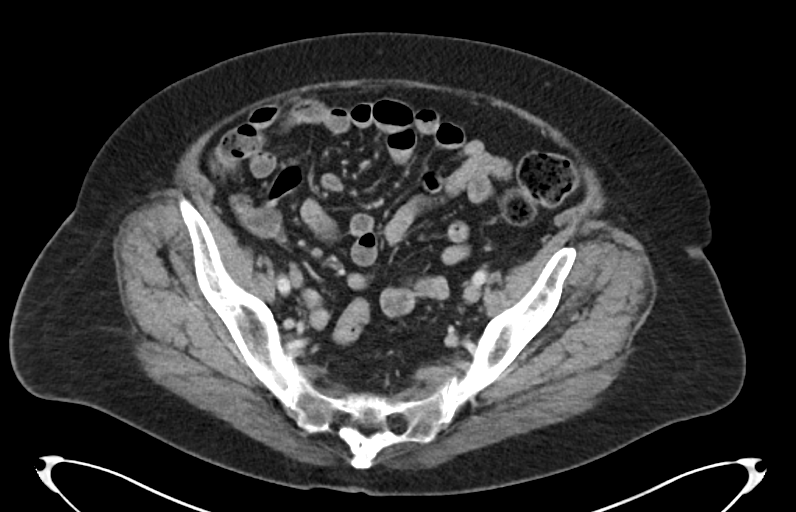
[im 41/91  soft-tissue]
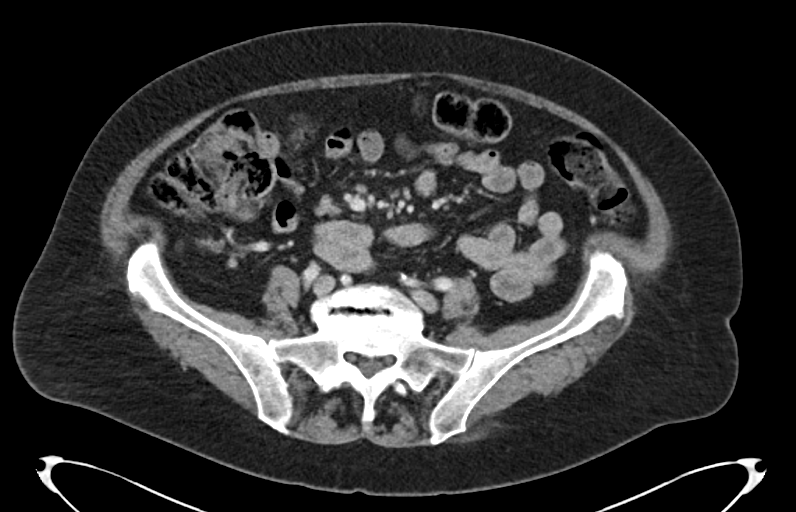
[im 51/91  soft-tissue]
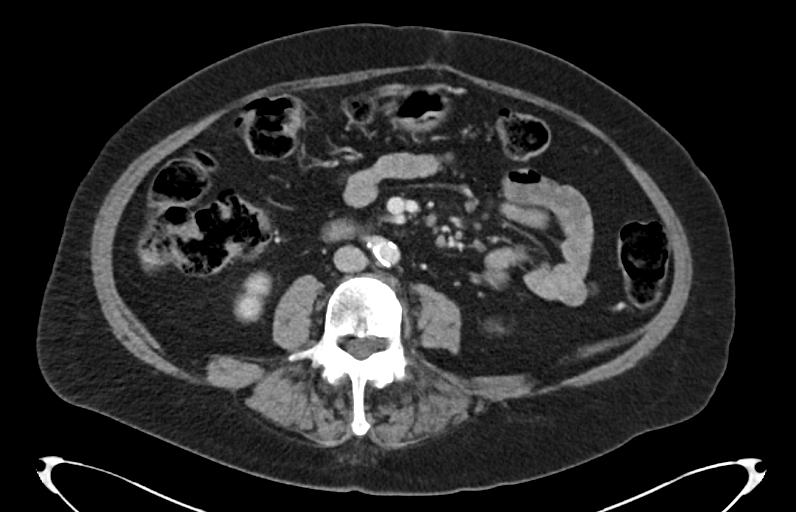
[im 61/91  soft-tissue]
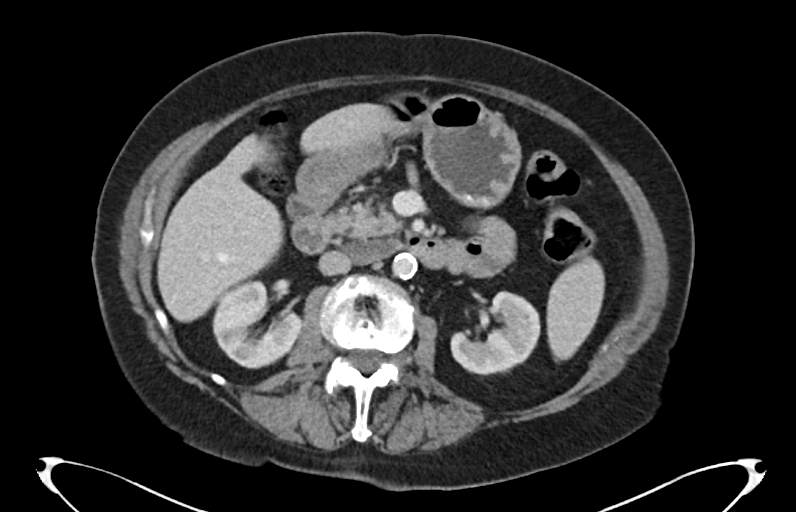
[im 71/91  soft-tissue]
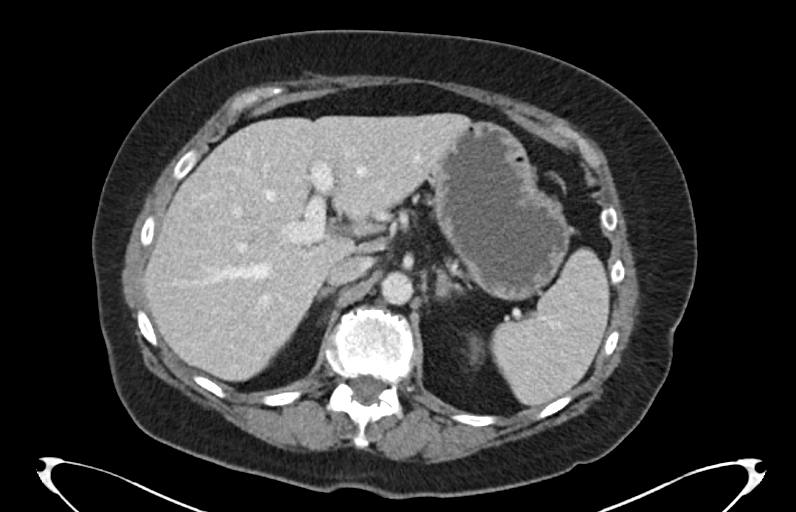
[im 81/91  soft-tissue]
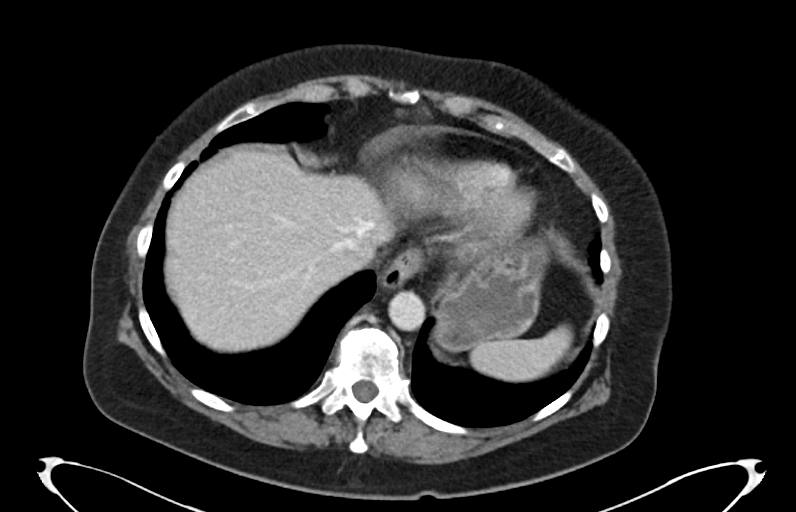

[Series 6: abd pelvis 2.00 br40 s3 cor · coronal · 0.89mm/px · 3 of 149 slices shown]
[im 50/149  soft-tissue]
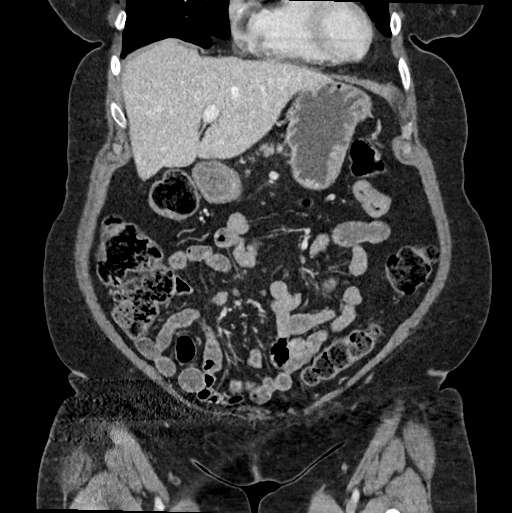
[im 66/149  soft-tissue]
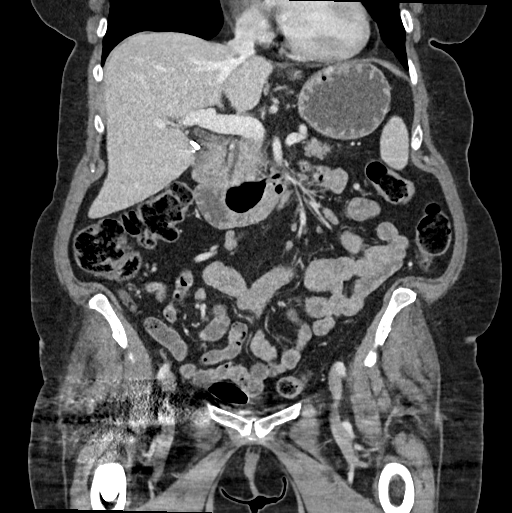
[im 83/149  soft-tissue]
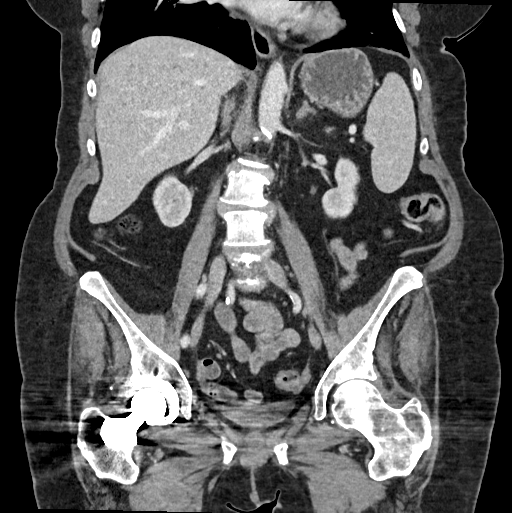

[11 of 46 positions shown; findings below may reference images not displayed]

FINDINGS: Lower chest: Scarring in the lung bases.  No acute abnormality.

Hepatobiliary: No focal liver abnormality is seen. Status post
cholecystectomy. No biliary dilatation.

Pancreas: No focal abnormality or ductal dilatation.

Spleen: No focal abnormality.  Normal size.

Adrenals/Urinary Tract: No adrenal abnormality. No focal renal
abnormality. No stones or hydronephrosis. Urinary bladder is
unremarkable.

Stomach/Bowel: Stomach and small bowel decompressed. The mid and
distal portions of the appendix are prominent measuring up to 12 mm.
This compares to 7 mm previously. This is nonspecific. No
surrounding inflammation. Large duodenal diverticulum along the 3rd
portion of the duodenum measuring 3.8 cm, stable since prior study.

Vascular/Lymphatic: Aortic atherosclerosis. No evidence of aneurysm
or adenopathy.

Reproductive: Prior hysterectomy.  No adnexal masses.

Other: No free fluid or free air.

Musculoskeletal: No acute bony abnormality.
IMPRESSION: Mildly prominent mid to distal appendix measuring up to 12 mm, in
increased in diameter since prior study. No surrounding
inflammation. Given the change since prior study, I would recommend
continued follow-up to exclude mucocele. Recommend follow-up CT in
3-6 months.

Aortic atherosclerosis.

## 2020-10-15 MED ORDER — IOPAMIDOL (ISOVUE-300) INJECTION 61%
80.0000 mL | Freq: Once | INTRAVENOUS | Status: AC | PRN
  Administered 2020-10-15: 80 mL via INTRAVENOUS

## 2020-10-22 ENCOUNTER — Ambulatory Visit (HOSPITAL_COMMUNITY)
Admission: RE | Admit: 2020-10-22 | Discharge: 2020-10-22 | Disposition: A | Discharge status: Home / Self Care | Payer: Medicare Other | Source: Ambulatory Visit | Attending: Internal Medicine | Admitting: Internal Medicine

## 2020-10-22 DIAGNOSIS — H748X3 Other specified disorders of middle ear and mastoid, bilateral: Secondary | ICD-10-CM | POA: Diagnosis not present

## 2020-10-22 DIAGNOSIS — M2548 Effusion, other site: Secondary | ICD-10-CM | POA: Diagnosis not present

## 2020-10-22 DIAGNOSIS — R110 Nausea: Secondary | ICD-10-CM | POA: Insufficient documentation

## 2020-10-22 DIAGNOSIS — R634 Abnormal weight loss: Secondary | ICD-10-CM | POA: Diagnosis not present

## 2020-10-22 DIAGNOSIS — R5381 Other malaise: Secondary | ICD-10-CM | POA: Insufficient documentation

## 2020-10-22 DIAGNOSIS — R42 Dizziness and giddiness: Secondary | ICD-10-CM | POA: Diagnosis not present

## 2020-10-22 IMAGING — MR MR HEAD WO/W CM
10 series · 48 of 48 positions shown · IV contrast (gadavist)
Comparison: Head CT [DATE]

CLINICAL DATA: Dizziness, dizziness.

EXAM:
MRI HEAD WITHOUT AND WITH CONTRAST
TECHNIQUE: Multiplanar, multiecho pulse sequences of the brain and surrounding
structures were obtained without and with intravenous contrast.
CONTRAST:  10mL GADAVIST GADOBUTROL 1 MMOL/ML IV SOLN

[Series 7: T1 · sagittal · 5.0mm · 0.75mm/px · 3 of 24 slices shown (1 of 2)]
[im 1/24]
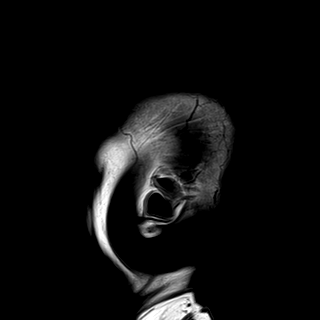
[im 12/24]
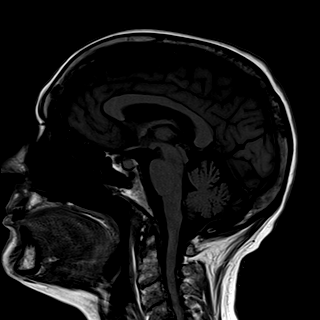
[im 24/24]
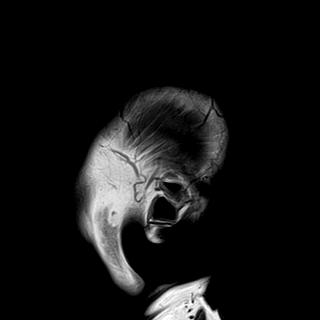

[Series 8: T2 · axial · 5.0mm · 0.62mm/px · z∈[-26,+133]mm · 2 of 26 slices shown]
[im 1/26]
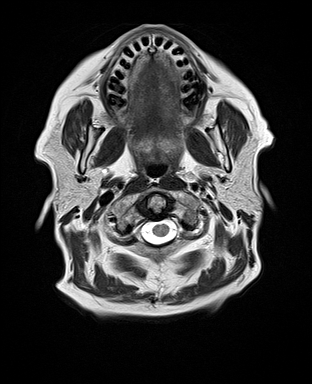
[im 26/26]
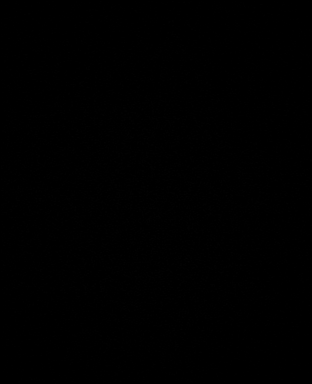

[Series 10: swi_images · axial · 3.0mm · 0.75mm/px · z∈[-51,+158]mm · 6 of 72 slices shown]
[im 1/72]
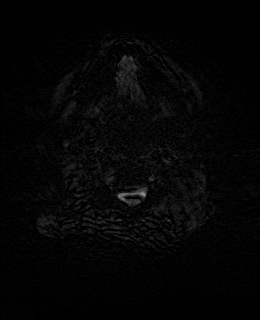
[im 15/72]
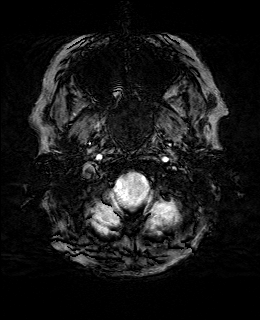
[im 29/72]
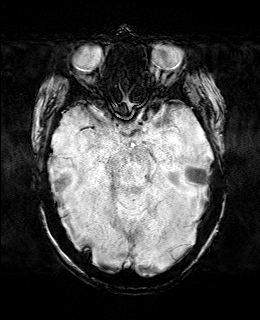
[im 43/72]
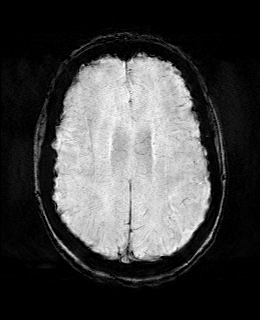
[im 57/72]
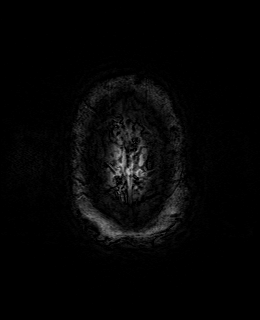
[im 72/72]
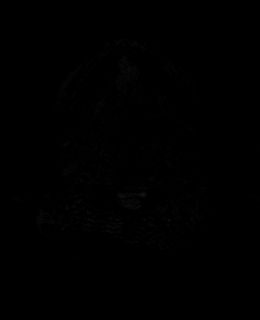

[Series 11: FLAIR · axial · 3.0mm · 0.75mm/px · z∈[-22,+129]mm · 4 of 52 slices shown]
[im 1/52]
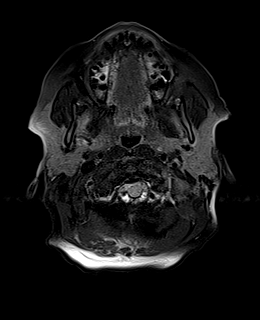
[im 18/52]
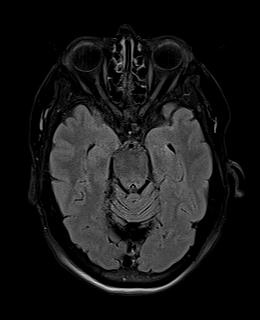
[im 35/52]
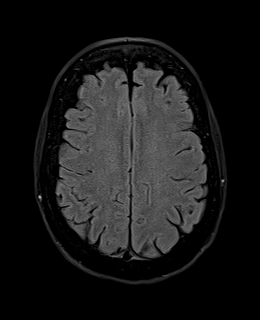
[im 52/52]
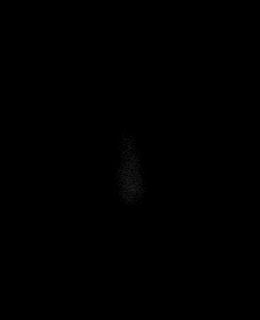

[Series 12: T1 · axial · 1.0mm · 0.94mm/px · z∈[-17,+124]mm · 11 of 144 slices shown (2 of 2)]
[im 1/144]
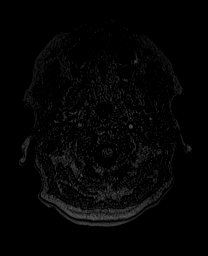
[im 15/144]
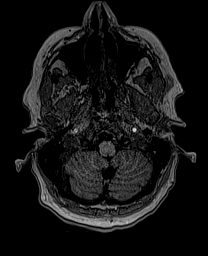
[im 29/144]
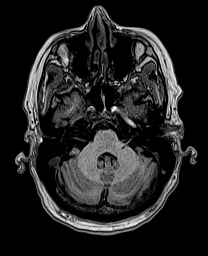
[im 43/144]
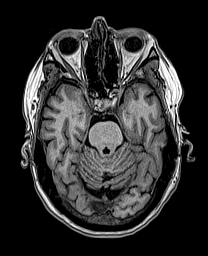
[im 58/144]
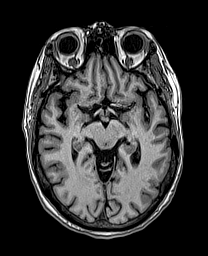
[im 72/144]
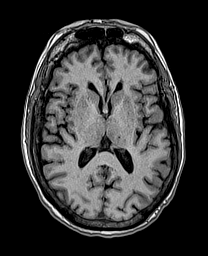
[im 86/144]
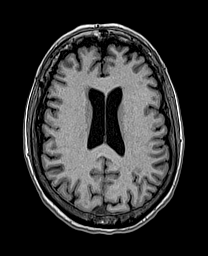
[im 101/144]
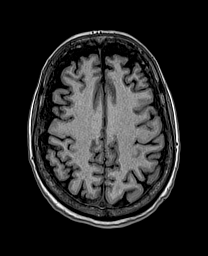
[im 115/144]
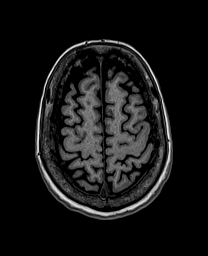
[im 129/144]
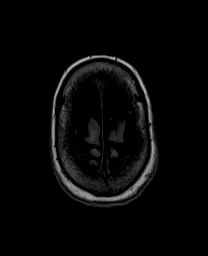
[im 144/144]
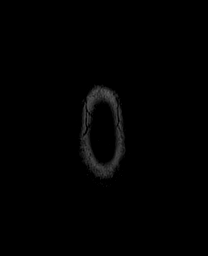

[Series 13: DWI · coronal · 5.0mm · 1.31mm/px · 5 of 64 slices shown (1 of 2)]
[im 1/64]
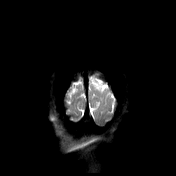
[im 16/64]
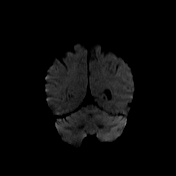
[im 32/64]
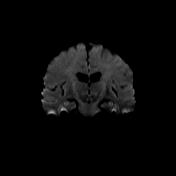
[im 48/64]
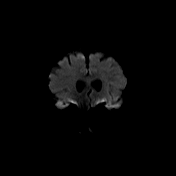
[im 64/64]
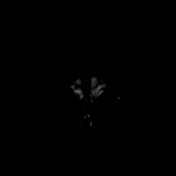

[Series 14: DWI · coronal · 5.0mm · 1.31mm/px · 2 of 32 slices shown (2 of 2)]
[im 1/32]
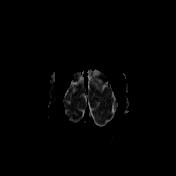
[im 32/32]
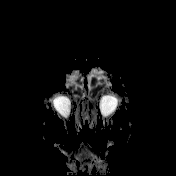

[Series 15: T2 post-contrast · coronal · 5.0mm · 0.57mm/px · 2 of 32 slices shown]
[im 1/32]
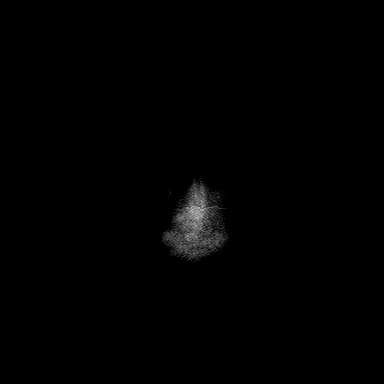
[im 32/32]
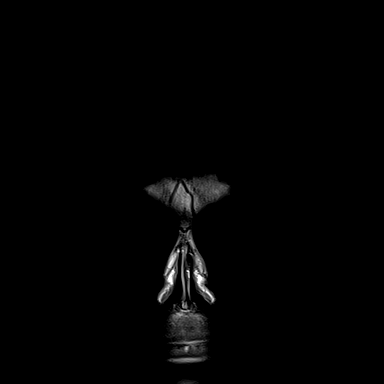

[Series 16: T1 post-contrast · axial · 1.0mm · 0.94mm/px · z∈[-17,+124]mm · 11 of 144 slices shown (1 of 2)]
[im 1/144]
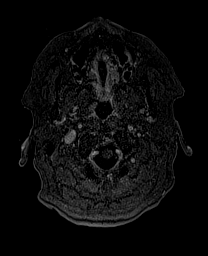
[im 15/144]
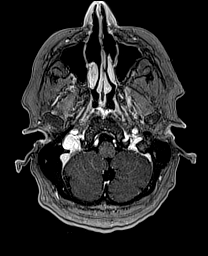
[im 29/144]
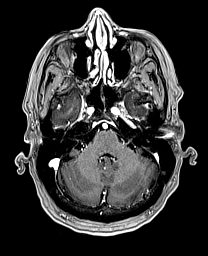
[im 43/144]
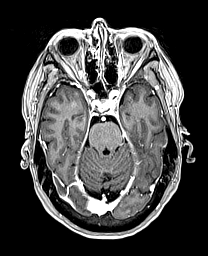
[im 58/144]
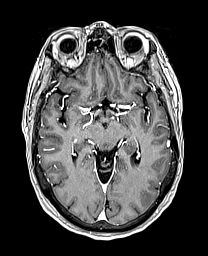
[im 72/144]
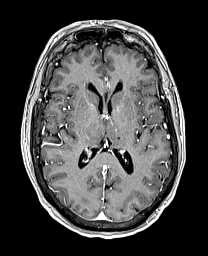
[im 86/144]
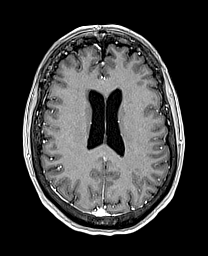
[im 101/144]
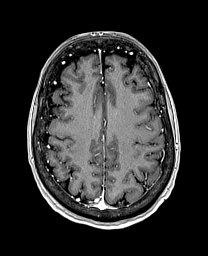
[im 115/144]
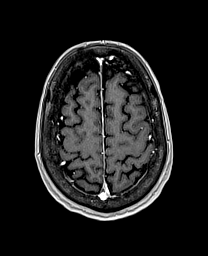
[im 129/144]
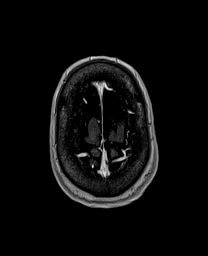
[im 144/144]
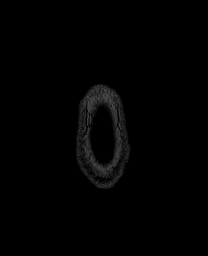

[Series 17: T1 post-contrast · coronal · 5.0mm · 0.43mm/px · 2 of 32 slices shown (2 of 2)]
[im 1/32]
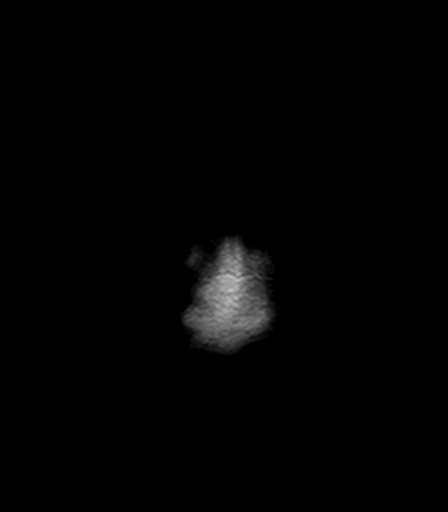
[im 32/32]
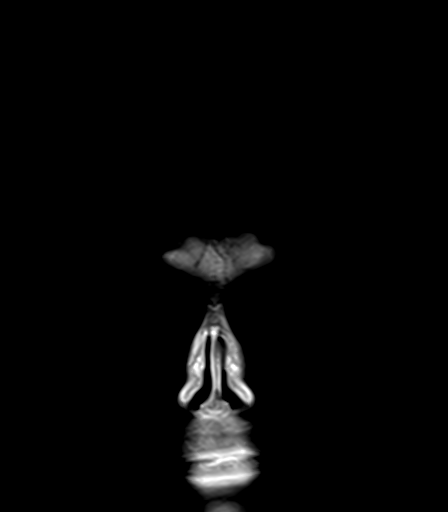

[48 of 48 positions shown; findings below may reference images not displayed]

FINDINGS: Brain: No acute infarction, hemorrhage, hydrocephalus, extra-axial
collection or mass lesion. No focus of abnormal contrast
enhancement.

Vascular: Normal flow voids.

Skull and upper cervical spine: Normal marrow signal.

Sinuses/Orbits: Paranasal sinuses are clear. Bilateral lens surgery.

Other: Minimal bilateral mastoid effusion.
IMPRESSION: Unremarkable MRI of the brain.

## 2020-10-22 MED ORDER — GADOBUTROL 1 MMOL/ML IV SOLN
10.0000 mL | Freq: Once | INTRAVENOUS | Status: AC | PRN
  Administered 2020-10-22: 10 mL via INTRAVENOUS

## 2020-11-14 ENCOUNTER — Other Ambulatory Visit: Payer: Self-pay

## 2020-11-14 ENCOUNTER — Ambulatory Visit: Payer: Medicare Other | Admitting: Nurse Practitioner

## 2020-11-24 ENCOUNTER — Ambulatory Visit: Payer: Medicare Other | Admitting: Internal Medicine

## 2020-11-27 ENCOUNTER — Ambulatory Visit (INDEPENDENT_AMBULATORY_CARE_PROVIDER_SITE_OTHER): Payer: Medicare Other | Admitting: Nurse Practitioner

## 2020-11-27 ENCOUNTER — Other Ambulatory Visit (INDEPENDENT_AMBULATORY_CARE_PROVIDER_SITE_OTHER): Payer: Medicare Other

## 2020-11-27 ENCOUNTER — Encounter: Payer: Self-pay | Admitting: Nurse Practitioner

## 2020-11-27 VITALS — BP 120/72 | HR 96 | Ht 63.25 in | Wt 190.2 lb

## 2020-11-27 DIAGNOSIS — K219 Gastro-esophageal reflux disease without esophagitis: Secondary | ICD-10-CM | POA: Diagnosis not present

## 2020-11-27 DIAGNOSIS — R748 Abnormal levels of other serum enzymes: Secondary | ICD-10-CM | POA: Diagnosis not present

## 2020-11-27 LAB — GAMMA GT: GGT: 128 U/L — ABNORMAL HIGH (ref 7–51)

## 2020-11-27 NOTE — Progress Notes (Signed)
11/27/2020 IONA STAY [id number] October 17, 1945   Chief Complaint: follow up nausea, vomiting and diarrhea   History of Present Illness:  Cassidy Garcia is a 75 year old female with a past medical history of depression, arthritis, asthma, hypertension, hyperlipidemia, CKD stage 3, GERD and colon polyps. Past cataract surgery,  hysterectomy and cholecystectomy.  I last saw Rehana Uncapher in office on 09/09/2020 following a visit to the ED with nausea, vomiting and diarrhea.  At that time, her symptoms abated and she requested repeat laboratory studies to check her kidney function she was concerned about possibly being dehydrated during her acute illness.  Laboratory studies done 09/09/2020 showed a normal BUN of 17 and creatinine 0.83.  She denies having any further nausea or vomiting.  No diarrhea.  She is passing normal formed brown bowel movement daily.  No rectal bleeding or black stools.  Her GERD is stable on Omeprazole 20 mg once daily.  However, if she eats within 2 hours of going to bed she develops acid reflux symptoms.  She lost about 15 pounds January into February 2022 but has gained back 5 pounds.  She underwent an abdominal/pelvic CT scan as ordered by Dr. Joya Salm on 10/15/2020 which showed a mildly prominent mid to distal appendix measuring up to 12 mm without surrounding inflammation.  The radiologist recommended a repeat CT in 3 to 6 months.  She underwent laboratory studies 10/13/2020 which showed a C-reactive protein level of 9.  Sed rate 60.  Ferritin 429.  Alk phos 171.  Total bili 0.8.  AST 19.  ALT 21.  WBC 8.90.  Hemoglobin 11.4.  Hematocrit 34.4.  MCV 90.1.  Platelet 259.   CTAP 10/15/2020: Lower chest: Scarring in the lung bases.  No acute abnormality. Hepatobiliary: No focal liver abnormality is seen. Status post cholecystectomy. No biliary dilatation. Pancreas: No focal abnormality or ductal dilatation. Spleen: No focal abnormality.  Normal size. Adrenals/Urinary Tract:  No adrenal abnormality. No focal renal abnormality. No stones or hydronephrosis. Urinary bladder is unremarkable. Stomach/Bowel: Stomach and small bowel decompressed. The mid and distal portions of the appendix are prominent measuring up to 12 mm. This compares to 7 mm previously. This is nonspecific. No surrounding inflammation. 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 Vascular/Lymphatic: Aortic atherosclerosis. No evidence of aneurysm or adenopathy. Reproductive: Prior hysterectomy.  No adnexal masses. Other: No free fluid or free air. Musculoskeletal: No acute bony abnormality. IMPRESSION: Mildly prominent mid to distal appendix measuring up to 12 mm, in increased in diameter since prior study. No surrounding inflammation.  Aortic atherosclerosis.   Colonoscopy 08/19/2016 Dr. Henrene Pastor:  - One 1 mm polyp in the proximal transverse colon, removed with a cold snare. Resected. - The examination was otherwise normal on direct and retroflexion views. - No meaningful tissue available for pathologic submission. - No further colonoscopies recommended due to age   Current Outpatient Medications on File Prior to Visit  Medication Sig Dispense Refill  . acetaminophen (TYLENOL) 500 MG tablet Take 1,000 mg by mouth every 6 (six) hours as needed for moderate pain.    Marland Kitchen albuterol (VENTOLIN HFA) 108 (90 Base) MCG/ACT inhaler Inhale 2 puffs into the lungs every 6 (six) hours as needed for wheezing or shortness of breath.     Marland Kitchen amitriptyline (ELAVIL) 25 MG tablet Take 75 mg by mouth at bedtime.     Marland Kitchen aspirin 81 MG tablet Take 81 mg by mouth  daily with breakfast.    . atorvastatin (LIPITOR) 40 MG tablet Take 40 mg by mouth daily.    . cholecalciferol (VITAMIN D3) 25 MCG (1000 UNIT) tablet Take 1,000 Units by mouth daily.    . diclofenac (VOLTAREN) 75 MG EC tablet Take 75 mg by mouth 2 (two) times daily.    Marland Kitchen levothyroxine  (SYNTHROID) 25 MCG tablet Take 1 tablet (25 mcg total) by mouth daily before breakfast. 30 tablet 0  . losartan-hydrochlorothiazide (HYZAAR) 100-25 MG tablet Take 1 tablet by mouth daily.    . mometasone (NASONEX) 50 MCG/ACT nasal spray Place 2 sprays into the nose as needed.    . Multiple Vitamin (MULTIVITAMIN) tablet Take 1 tablet by mouth daily.    . Omega-3 Fatty Acids (FISH OIL) 1200 MG CAPS Take 1 capsule by mouth 2 (two) times daily.    Marland Kitchen omeprazole (PRILOSEC) 20 MG capsule Take 20 mg by mouth daily with breakfast.    . ondansetron (ZOFRAN-ODT) 4 MG disintegrating tablet Take 1 tablet (4 mg total) by mouth every 8 (eight) hours as needed for nausea or vomiting. 20 tablet 0  . traMADol (ULTRAM) 50 MG tablet Take by mouth as needed.    . verapamil (CALAN-SR) 240 MG CR tablet Take 240 mg by mouth at bedtime.    . VOLTAREN 1 % GEL Apply 2 g topically as needed.     No current facility-administered medications on file prior to visit.   Allergies  Allergen Reactions  . Codeine Nausea And Vomiting    REACTION: Lightheaded, nauses  . Demerol [Meperidine Hcl] Other (See Comments)    Dizziness   . Phenergan [Promethazine] Other (See Comments)    Feeling like heart attack     Current Medications, Allergies, Past Medical History, Past Surgical History, Family History and Social History were reviewed in [employer] record.   Review of Systems:   Constitutional: Negative for fever, sweats, chills or weight loss.  Respiratory: Negative for shortness of breath.   Cardiovascular: Negative for chest pain, palpitations and leg swelling.  Gastrointestinal: See HPI.  Musculoskeletal: Negative for back pain or muscle aches.  Neurological: Negative for dizziness, headaches or paresthesias.    Physical Exam: BP 120/72 (BP Location: Left Arm, Patient Position: Sitting, Cuff Size: Normal)   Pulse 96   Ht 5' 3.25" (1.607 m)   Wt 190 lb 4 oz (86.3 kg)   BMI 33.44 kg/m   General: 75 year old female in no acute distress. Head: Normocephalic and atraumatic. Eyes: No scleral icterus. Conjunctiva pink . Ears: Normal auditory acuity. Lungs: Clear throughout to auscultation. Heart: Regular rate and rhythm, no murmur. Abdomen: Soft, nontender and nondistended. No masses or hepatomegaly. Normal bowel sounds x 4 quadrants.  Rectal: Musculoskeletal: Symmetrical with no gross deformities. Extremities: No edema. Neurological: Alert oriented x 4. No focal deficits.  Psychological: Alert and cooperative. Normal mood and affect  Assessment and Recommendations:  38.  75 year old female with GERD, stable.  -Continue Omeprazole 20 mg daily -Avoid eating within 3 to 4 hours before going to bed  2. Elevated Sed rate level of unclear etiology. No upper or lower abdominal pain. Normal BMs.   3. Elevated Alk phos level with normal total bili, AST and ALT levels. CTAP 10/15/2020 showed a normal liver.  -GGT, alk phos isoenzymes   4.  History of colon polyps. Last colonoscopy 08/2016 one 21m polyp was removed from the colon but tissue was not sufficient for biopsy.  -No further colonoscopies  recommended unless medically necessary   5. CTAP 10/15/2020 showed a mildly prominent mid to distal appendix measuring up to 12 mm, in increased in diameter since prior study.  -Patient to follow-up with Dr. Reynaldo Minium to discuss repeat CTAP in 3 to 4 months

## 2020-11-27 NOTE — Patient Instructions (Addendum)
If you are age 76 or older, your body mass index should be between 23-30. Your Body mass index is 33.44 kg/m. If this is out of the aforementioned range listed, please consider follow up with your Primary Care Provider.  LABS:  Lab work has been ordered for you today. Our lab is located in the basement. Press "B" on the elevator. The lab is located at the first door on the left as you exit the elevator.  HEALTHCARE LAWS AND MY CHART RESULTS: Due to recent changes in healthcare laws, you may see the results of your imaging and laboratory studies on MyChart before your provider has had a chance to review them.   We understand that in some cases there may be results that are confusing or concerning to you. Not all laboratory results come back in the same time frame and the provider may be waiting for multiple results in order to interpret others.  Please give [place] 48 hours in order for your provider to thoroughly review all the results before contacting the office for clarification of your results.   Follow up with your primary care provider regarding the CT scan which showed minor appendix abnormality. Consider repeating the CT in 3 months.   Continue Omeprazole 20 MG once a day.  It was great seeing you today! Thank you for entrusting me with your care and choosing [facility].  [name], CRNP

## 2020-11-28 NOTE — Progress Notes (Signed)
Noted  

## 2020-12-01 ENCOUNTER — Other Ambulatory Visit: Payer: Self-pay

## 2020-12-01 DIAGNOSIS — R748 Abnormal levels of other serum enzymes: Secondary | ICD-10-CM

## 2020-12-01 LAB — ALKALINE PHOSPHATASE, ISOENZYMES
Alkaline Phosphatase: 407 IU/L — ABNORMAL HIGH (ref 44–121)
BONE FRACTION: 24 % (ref 14–68)
INTESTINAL FRAC.: 2 % (ref 0–18)
LIVER FRACTION: 74 % (ref 18–85)

## 2020-12-01 NOTE — Telephone Encounter (Signed)
I spoke  to the patient, see lab notes for discussion and recommendations

## 2020-12-04 ENCOUNTER — Other Ambulatory Visit (INDEPENDENT_AMBULATORY_CARE_PROVIDER_SITE_OTHER): Payer: Medicare Other

## 2020-12-04 DIAGNOSIS — R748 Abnormal levels of other serum enzymes: Secondary | ICD-10-CM | POA: Diagnosis not present

## 2020-12-04 LAB — HEPATIC FUNCTION PANEL
ALT: 36 U/L — ABNORMAL HIGH (ref 0–35)
AST: 26 U/L (ref 0–37)
Albumin: 3.8 g/dL (ref 3.5–5.2)
Alkaline Phosphatase: 303 U/L — ABNORMAL HIGH (ref 39–117)
Bilirubin, Direct: 0.1 mg/dL (ref 0.0–0.3)
Total Bilirubin: 0.6 mg/dL (ref 0.2–1.2)
Total Protein: 6.4 g/dL (ref 6.0–8.3)

## 2020-12-07 LAB — ANTI-SMOOTH MUSCLE ANTIBODY, IGG: Smooth Muscle Ab: 11 Units (ref 0–19)

## 2020-12-07 LAB — NUCLEOTIDASE, 5', BLOOD: 5-Nucleotidase: 10 IU/L (ref 0–10)

## 2020-12-07 LAB — MITOCHONDRIAL ANTIBODIES: Mitochondrial Ab: <20 Units (ref 0.0–20.0)

## 2020-12-09 ENCOUNTER — Other Ambulatory Visit: Payer: Self-pay

## 2020-12-09 DIAGNOSIS — R748 Abnormal levels of other serum enzymes: Secondary | ICD-10-CM

## 2020-12-17 ENCOUNTER — Encounter: Payer: Self-pay | Admitting: Orthopaedic Surgery

## 2020-12-22 ENCOUNTER — Encounter: Payer: Self-pay | Admitting: Physician Assistant

## 2020-12-22 ENCOUNTER — Ambulatory Visit (INDEPENDENT_AMBULATORY_CARE_PROVIDER_SITE_OTHER): Payer: Medicare Other | Admitting: Physician Assistant

## 2020-12-22 ENCOUNTER — Ambulatory Visit: Payer: Self-pay

## 2020-12-22 DIAGNOSIS — Z23 Encounter for immunization: Secondary | ICD-10-CM | POA: Diagnosis not present

## 2020-12-22 DIAGNOSIS — M25551 Pain in right hip: Secondary | ICD-10-CM | POA: Diagnosis not present

## 2020-12-22 MED ORDER — LIDOCAINE HCL 1 % IJ SOLN
3.0000 mL | INTRAMUSCULAR | Status: AC | PRN
  Administered 2020-12-22: 3 mL

## 2020-12-22 MED ORDER — METHYLPREDNISOLONE ACETATE 40 MG/ML IJ SUSP
40.0000 mg | INTRAMUSCULAR | Status: AC | PRN
  Administered 2020-12-22: 40 mg via INTRA_ARTICULAR

## 2020-12-22 NOTE — Progress Notes (Addendum)
Office Visit Note   Patient: Cassidy Garcia           Date of Birth: 1946/04/10           MRN: [id number] Visit Date: 12/22/2020              Requested by: Burnard Bunting, MD [address] [place],  [place] [address] PCP: Burnard Bunting, MD   Assessment & Plan: Visit Diagnoses:  1. Pain in right hip     Plan: She will work on IT band stretching.  The area of chemical burn to the right lumbar paraspinous region is dressed with Xeroform and a Tegaderm.  She will leave this on for 2 days and then changed to supplies were given.  She has any concerns or complications she will follow-up with [place] otherwise follow-up as needed.  Questions were encouraged and answered.  Follow-Up Instructions: Return if symptoms worsen or fail to improve.   Orders:  Orders Placed This Encounter  Procedures  . Large Joint Inj: R greater trochanter  . XR HIP UNILAT W OR W/O PELVIS 2-3 VIEWS RIGHT   Meds ordered this encounter  Medications  . lidocaine (XYLOCAINE) 1 % (with pres) injection 3 mL  . methylPREDNISolone acetate (DEPO-MEDROL) injection 40 mg      Procedures: Large Joint Inj: R greater trochanter on 12/22/2020 1:57 PM Indications: pain Details: 22 G 1.5 in needle, lateral approach  Arthrogram: No  Medications: 3 mL lidocaine 1 %; 40 mg methylPREDNISolone acetate 40 MG/ML Outcome: tolerated well, no immediate complications Procedure, treatment alternatives, risks and benefits explained, specific risks discussed. Consent was given by the patient. Immediately prior to procedure a time out was called to verify the correct patient, procedure, equipment, support staff and site/side marked as required. Patient was prepped and draped in the usual sterile fashion.       Clinical Data: No additional findings.    Subjective: Chief Complaint  Patient presents with  . Right Hip - Pain    HPI Cassidy Garcia is well-known to Dr. Ninfa Linden service status post right total hip arthroplasty on  12/10/2011.  She states she has had pain lateral aspect of her right hip for last week no known injury.  She is having no radicular symptoms down the leg.  She did try an ice pack on her right hip.  The ice pack did rupture and she got some of the chemical on her skin suffered a chemical abrasion which she has been treating with a antibiotic cream.  No fevers or chills. Review of Systems See HPI  Objective: Vital Signs: There were no vitals taken for this visit.  Physical Exam General well-developed well-nourished female no acute distress. Ortho Exam Right hip good range of motion without pain.  Tenderness over the right trochanteric region.  Slight tenderness over the left trochanteric region.  Good range of motion of the left hip without pain.  Right lower lumbar paraspinous region area of chemical abrasion but no signs of infection.  Specialty Comments:  No specialty comments available.  Imaging: XR HIP UNILAT W OR W/O PELVIS 2-3 VIEWS RIGHT  Result Date: 12/22/2020 AP pelvis lateral view of the right hip: Bilateral hips well located.  Status post right total hip arthroplasty well-seated components no hardware failure.  No acute fractures.    PMFS History: Patient Active Problem List   Diagnosis Date Noted  . Gastroenteritis 09/09/2020  . Depression 01/24/2020  . Asthma 01/24/2020  . CKD (chronic kidney disease), stage  III ([place]) 01/24/2020  . Acute on chronic renal failure ([place]) 01/24/2020  . Pain in right hip 06/29/2017  . Trochanteric bursitis, right hip 06/29/2017  . 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 Great toe pain, left 12/04/2016  . Chronic right shoulder pain 12/04/2016  . Degenerative arthritis of hip 12/10/2011  . Chest pain 07/06/2011  . HTN (hypertension) 07/06/2011  . Hyperlipidemia 07/06/2011   Past Medical History:  Diagnosis Date  . Arthritis   . Asthma   . Bronchitis 03/2016  . Chronic kidney disease    stage 3  . Colon polyps     tubular adenoma  . Constipation   . Depression   . Diverticulosis   . GERD (gastroesophageal reflux disease)   . Hemorrhoids   . Hyperlipidemia   . Hypertension   . Hypothyroidism   . Shortness of breath    with exertion on occasion  . Sinusitis   . Skin cancer     Family History  Problem Relation Age of Onset  . Ovarian cancer Mother   . Anxiety disorder Mother   . Heart disease Paternal Grandmother   . Other Paternal Aunt        brain tumor  . Birth defects Sister        no iris  . Depression Sister   . Anxiety disorder Sister   . Colon cancer Neg Hx     Past Surgical History:  Procedure Laterality Date  . ABDOMINAL HYSTERECTOMY    . CARPOMETACARPAL (CMC) FUSION OF THUMB Bilateral   . CATARACT EXTRACTION, BILATERAL    . CHOLECYSTECTOMY    . COLONOSCOPY  02/12/2011  . HAMMER TOE SURGERY Right   . NASAL SINUS SURGERY    . POLYPECTOMY    . TOTAL HIP ARTHROPLASTY  12/10/2011   Procedure: TOTAL HIP ARTHROPLASTY ANTERIOR APPROACH;  Surgeon: Mcarthur Rossetti, MD;  Location: [facility];  Service: Orthopedics;  Laterality: Right;  Right Total Hip Arthroplasty, Anterior Approach   Social History   Occupational History  . Occupation: retired    Fish farm manager: RETIRED  Tobacco Use  . Smoking status: Former Smoker    Quit date: 08/16/2001    Years since quitting: 19.3  . Smokeless tobacco: Never Used  Vaping Use  . Vaping Use: Never used  Substance and Sexual Activity  . Alcohol use: Yes    Comment: rare  . Drug use: No  . Sexual activity: Not on file

## 2020-12-30 MED ORDER — ACETAMINOPHEN-CODEINE #3 300-30 MG PO TABS: 1.0000 | ORAL_TABLET | ORAL | 0 refills | Status: DC | PRN

## 2020-12-30 NOTE — Telephone Encounter (Signed)
Please advise 

## 2021-01-13 ENCOUNTER — Other Ambulatory Visit: Payer: Self-pay

## 2021-01-13 DIAGNOSIS — M25551 Pain in right hip: Secondary | ICD-10-CM

## 2021-01-13 NOTE — Telephone Encounter (Signed)
Order in chart. Do you mind faxing over?

## 2021-01-14 NOTE — Telephone Encounter (Signed)
PT order faxed to [facility]

## 2021-01-15 DIAGNOSIS — L570 Actinic keratosis: Secondary | ICD-10-CM | POA: Diagnosis not present

## 2021-01-15 DIAGNOSIS — D225 Melanocytic nevi of trunk: Secondary | ICD-10-CM | POA: Diagnosis not present

## 2021-01-15 DIAGNOSIS — D485 Neoplasm of uncertain behavior of skin: Secondary | ICD-10-CM | POA: Diagnosis not present

## 2021-01-15 DIAGNOSIS — C44519 Basal cell carcinoma of skin of other part of trunk: Secondary | ICD-10-CM | POA: Diagnosis not present

## 2021-01-15 DIAGNOSIS — Z85828 Personal history of other malignant neoplasm of skin: Secondary | ICD-10-CM | POA: Diagnosis not present

## 2021-01-15 DIAGNOSIS — L821 Other seborrheic keratosis: Secondary | ICD-10-CM | POA: Diagnosis not present

## 2021-01-22 DIAGNOSIS — M25551 Pain in right hip: Secondary | ICD-10-CM | POA: Diagnosis not present

## 2021-01-22 DIAGNOSIS — R2681 Unsteadiness on feet: Secondary | ICD-10-CM | POA: Diagnosis not present

## 2021-01-22 DIAGNOSIS — M545 Low back pain, unspecified: Secondary | ICD-10-CM | POA: Diagnosis not present

## 2021-01-27 DIAGNOSIS — M25551 Pain in right hip: Secondary | ICD-10-CM | POA: Diagnosis not present

## 2021-01-27 DIAGNOSIS — M545 Low back pain, unspecified: Secondary | ICD-10-CM | POA: Diagnosis not present

## 2021-01-27 DIAGNOSIS — R2681 Unsteadiness on feet: Secondary | ICD-10-CM | POA: Diagnosis not present

## 2021-01-29 DIAGNOSIS — M545 Low back pain, unspecified: Secondary | ICD-10-CM | POA: Diagnosis not present

## 2021-01-29 DIAGNOSIS — M25551 Pain in right hip: Secondary | ICD-10-CM | POA: Diagnosis not present

## 2021-01-29 DIAGNOSIS — R2681 Unsteadiness on feet: Secondary | ICD-10-CM | POA: Diagnosis not present

## 2021-02-02 DIAGNOSIS — R2681 Unsteadiness on feet: Secondary | ICD-10-CM | POA: Diagnosis not present

## 2021-02-02 DIAGNOSIS — M545 Low back pain, unspecified: Secondary | ICD-10-CM | POA: Diagnosis not present

## 2021-02-02 DIAGNOSIS — M25551 Pain in right hip: Secondary | ICD-10-CM | POA: Diagnosis not present

## 2021-02-04 DIAGNOSIS — M25551 Pain in right hip: Secondary | ICD-10-CM | POA: Diagnosis not present

## 2021-02-04 DIAGNOSIS — M545 Low back pain, unspecified: Secondary | ICD-10-CM | POA: Diagnosis not present

## 2021-02-04 DIAGNOSIS — R2681 Unsteadiness on feet: Secondary | ICD-10-CM | POA: Diagnosis not present

## 2021-02-09 DIAGNOSIS — R2681 Unsteadiness on feet: Secondary | ICD-10-CM | POA: Diagnosis not present

## 2021-02-09 DIAGNOSIS — M545 Low back pain, unspecified: Secondary | ICD-10-CM | POA: Diagnosis not present

## 2021-02-09 DIAGNOSIS — M25551 Pain in right hip: Secondary | ICD-10-CM | POA: Diagnosis not present

## 2021-02-11 DIAGNOSIS — R2681 Unsteadiness on feet: Secondary | ICD-10-CM | POA: Diagnosis not present

## 2021-02-11 DIAGNOSIS — M545 Low back pain, unspecified: Secondary | ICD-10-CM | POA: Diagnosis not present

## 2021-02-11 DIAGNOSIS — M25551 Pain in right hip: Secondary | ICD-10-CM | POA: Diagnosis not present

## 2021-02-17 DIAGNOSIS — R2681 Unsteadiness on feet: Secondary | ICD-10-CM | POA: Diagnosis not present

## 2021-02-17 DIAGNOSIS — M25551 Pain in right hip: Secondary | ICD-10-CM | POA: Diagnosis not present

## 2021-02-17 DIAGNOSIS — M545 Low back pain, unspecified: Secondary | ICD-10-CM | POA: Diagnosis not present

## 2021-02-19 DIAGNOSIS — M545 Low back pain, unspecified: Secondary | ICD-10-CM | POA: Diagnosis not present

## 2021-02-19 DIAGNOSIS — R2681 Unsteadiness on feet: Secondary | ICD-10-CM | POA: Diagnosis not present

## 2021-02-19 DIAGNOSIS — M25551 Pain in right hip: Secondary | ICD-10-CM | POA: Diagnosis not present

## 2021-02-25 DIAGNOSIS — M25551 Pain in right hip: Secondary | ICD-10-CM | POA: Diagnosis not present

## 2021-02-25 DIAGNOSIS — M545 Low back pain, unspecified: Secondary | ICD-10-CM | POA: Diagnosis not present

## 2021-02-25 DIAGNOSIS — R2681 Unsteadiness on feet: Secondary | ICD-10-CM | POA: Diagnosis not present

## 2021-03-03 DIAGNOSIS — R2681 Unsteadiness on feet: Secondary | ICD-10-CM | POA: Diagnosis not present

## 2021-03-03 DIAGNOSIS — M545 Low back pain, unspecified: Secondary | ICD-10-CM | POA: Diagnosis not present

## 2021-03-03 DIAGNOSIS — M25551 Pain in right hip: Secondary | ICD-10-CM | POA: Diagnosis not present

## 2021-03-05 DIAGNOSIS — M25551 Pain in right hip: Secondary | ICD-10-CM | POA: Diagnosis not present

## 2021-03-05 DIAGNOSIS — R2681 Unsteadiness on feet: Secondary | ICD-10-CM | POA: Diagnosis not present

## 2021-03-05 DIAGNOSIS — M545 Low back pain, unspecified: Secondary | ICD-10-CM | POA: Diagnosis not present

## 2021-03-10 ENCOUNTER — Other Ambulatory Visit: Payer: Self-pay | Admitting: Nurse Practitioner

## 2021-03-10 DIAGNOSIS — R748 Abnormal levels of other serum enzymes: Secondary | ICD-10-CM

## 2021-03-12 DIAGNOSIS — M545 Low back pain, unspecified: Secondary | ICD-10-CM | POA: Diagnosis not present

## 2021-03-12 DIAGNOSIS — R2681 Unsteadiness on feet: Secondary | ICD-10-CM | POA: Diagnosis not present

## 2021-03-12 DIAGNOSIS — M25551 Pain in right hip: Secondary | ICD-10-CM | POA: Diagnosis not present

## 2021-03-14 DIAGNOSIS — R748 Abnormal levels of other serum enzymes: Secondary | ICD-10-CM

## 2021-03-17 DIAGNOSIS — M25551 Pain in right hip: Secondary | ICD-10-CM | POA: Diagnosis not present

## 2021-03-17 DIAGNOSIS — R2681 Unsteadiness on feet: Secondary | ICD-10-CM | POA: Diagnosis not present

## 2021-03-17 DIAGNOSIS — M545 Low back pain, unspecified: Secondary | ICD-10-CM | POA: Diagnosis not present

## 2021-03-19 ENCOUNTER — Other Ambulatory Visit (INDEPENDENT_AMBULATORY_CARE_PROVIDER_SITE_OTHER): Payer: Medicare Other

## 2021-03-19 DIAGNOSIS — R748 Abnormal levels of other serum enzymes: Secondary | ICD-10-CM

## 2021-03-19 DIAGNOSIS — M545 Low back pain, unspecified: Secondary | ICD-10-CM | POA: Diagnosis not present

## 2021-03-19 DIAGNOSIS — R2681 Unsteadiness on feet: Secondary | ICD-10-CM | POA: Diagnosis not present

## 2021-03-19 DIAGNOSIS — M25551 Pain in right hip: Secondary | ICD-10-CM | POA: Diagnosis not present

## 2021-03-19 LAB — HEPATIC FUNCTION PANEL
ALT: 27 U/L (ref 0–35)
AST: 19 U/L (ref 0–37)
Albumin: 4.2 g/dL (ref 3.5–5.2)
Alkaline Phosphatase: 133 U/L — ABNORMAL HIGH (ref 39–117)
Bilirubin, Direct: 0.1 mg/dL (ref 0.0–0.3)
Total Bilirubin: 0.6 mg/dL (ref 0.2–1.2)
Total Protein: 6.5 g/dL (ref 6.0–8.3)

## 2021-03-26 ENCOUNTER — Telehealth: Payer: Self-pay | Admitting: Nurse Practitioner

## 2021-03-26 ENCOUNTER — Other Ambulatory Visit: Payer: Self-pay | Admitting: *Deleted

## 2021-03-26 DIAGNOSIS — K529 Noninfective gastroenteritis and colitis, unspecified: Secondary | ICD-10-CM

## 2021-03-26 NOTE — Telephone Encounter (Signed)
Please see prior encounter.  

## 2021-03-27 ENCOUNTER — Other Ambulatory Visit: Payer: Self-pay | Admitting: Physician Assistant

## 2021-03-30 ENCOUNTER — Other Ambulatory Visit: Payer: Self-pay | Admitting: Physician Assistant

## 2021-03-30 MED ORDER — ACETAMINOPHEN-CODEINE #3 300-30 MG PO TABS: 1.0000 | ORAL_TABLET | ORAL | 0 refills | Status: DC | PRN

## 2021-03-31 DIAGNOSIS — M25551 Pain in right hip: Secondary | ICD-10-CM | POA: Diagnosis not present

## 2021-03-31 DIAGNOSIS — R2681 Unsteadiness on feet: Secondary | ICD-10-CM | POA: Diagnosis not present

## 2021-03-31 DIAGNOSIS — M545 Low back pain, unspecified: Secondary | ICD-10-CM | POA: Diagnosis not present

## 2021-04-14 DIAGNOSIS — R2681 Unsteadiness on feet: Secondary | ICD-10-CM | POA: Diagnosis not present

## 2021-04-14 DIAGNOSIS — M545 Low back pain, unspecified: Secondary | ICD-10-CM | POA: Diagnosis not present

## 2021-04-14 DIAGNOSIS — M25551 Pain in right hip: Secondary | ICD-10-CM | POA: Diagnosis not present

## 2021-04-21 DIAGNOSIS — M545 Low back pain, unspecified: Secondary | ICD-10-CM | POA: Diagnosis not present

## 2021-04-21 DIAGNOSIS — R2681 Unsteadiness on feet: Secondary | ICD-10-CM | POA: Diagnosis not present

## 2021-04-21 DIAGNOSIS — M25551 Pain in right hip: Secondary | ICD-10-CM | POA: Diagnosis not present

## 2021-04-28 DIAGNOSIS — R2681 Unsteadiness on feet: Secondary | ICD-10-CM | POA: Diagnosis not present

## 2021-04-28 DIAGNOSIS — M545 Low back pain, unspecified: Secondary | ICD-10-CM | POA: Diagnosis not present

## 2021-04-28 DIAGNOSIS — M25551 Pain in right hip: Secondary | ICD-10-CM | POA: Diagnosis not present

## 2021-05-06 DIAGNOSIS — M25551 Pain in right hip: Secondary | ICD-10-CM | POA: Diagnosis not present

## 2021-05-06 DIAGNOSIS — R2681 Unsteadiness on feet: Secondary | ICD-10-CM | POA: Diagnosis not present

## 2021-05-06 DIAGNOSIS — M545 Low back pain, unspecified: Secondary | ICD-10-CM | POA: Diagnosis not present

## 2021-05-12 DIAGNOSIS — M545 Low back pain, unspecified: Secondary | ICD-10-CM | POA: Diagnosis not present

## 2021-05-12 DIAGNOSIS — M25551 Pain in right hip: Secondary | ICD-10-CM | POA: Diagnosis not present

## 2021-05-12 DIAGNOSIS — R2681 Unsteadiness on feet: Secondary | ICD-10-CM | POA: Diagnosis not present

## 2021-05-20 DIAGNOSIS — M545 Low back pain, unspecified: Secondary | ICD-10-CM | POA: Diagnosis not present

## 2021-05-20 DIAGNOSIS — M25551 Pain in right hip: Secondary | ICD-10-CM | POA: Diagnosis not present

## 2021-05-20 DIAGNOSIS — R2681 Unsteadiness on feet: Secondary | ICD-10-CM | POA: Diagnosis not present

## 2021-05-23 DIAGNOSIS — Z23 Encounter for immunization: Secondary | ICD-10-CM | POA: Diagnosis not present

## 2021-05-26 DIAGNOSIS — M545 Low back pain, unspecified: Secondary | ICD-10-CM | POA: Diagnosis not present

## 2021-05-26 DIAGNOSIS — R2681 Unsteadiness on feet: Secondary | ICD-10-CM | POA: Diagnosis not present

## 2021-05-26 DIAGNOSIS — M25551 Pain in right hip: Secondary | ICD-10-CM | POA: Diagnosis not present

## 2021-06-03 DIAGNOSIS — M25551 Pain in right hip: Secondary | ICD-10-CM | POA: Diagnosis not present

## 2021-06-03 DIAGNOSIS — R2681 Unsteadiness on feet: Secondary | ICD-10-CM | POA: Diagnosis not present

## 2021-06-03 DIAGNOSIS — M545 Low back pain, unspecified: Secondary | ICD-10-CM | POA: Diagnosis not present

## 2021-06-26 ENCOUNTER — Other Ambulatory Visit: Payer: Self-pay

## 2021-06-26 ENCOUNTER — Ambulatory Visit (HOSPITAL_COMMUNITY)
Admission: RE | Admit: 2021-06-26 | Discharge: 2021-06-26 | Disposition: A | Discharge status: Home / Self Care | Payer: Medicare Other | Source: Ambulatory Visit | Attending: Internal Medicine | Admitting: Internal Medicine

## 2021-06-26 DIAGNOSIS — K529 Noninfective gastroenteritis and colitis, unspecified: Secondary | ICD-10-CM

## 2021-06-26 DIAGNOSIS — R109 Unspecified abdominal pain: Secondary | ICD-10-CM | POA: Diagnosis not present

## 2021-06-26 LAB — POCT I-STAT CREATININE: Creatinine, Ser: 1.4 mg/dL — ABNORMAL HIGH (ref 0.44–1.00)

## 2021-06-26 IMAGING — CT CT ABD-PELV W/ CM
2 of 5 series · 17 of 46 positions shown, 19 images · IV contrast (OMNIPAQUE)
Comparison: [DATE] from [HOSPITAL]

CLINICAL DATA: Abdominal pain.

EXAM:
CT ABDOMEN AND PELVIS WITH CONTRAST
TECHNIQUE: Multidetector CT imaging of the abdomen and pelvis was performed
using the standard protocol following bolus administration of
intravenous contrast.
CONTRAST:  65mL OMNIPAQUE IOHEXOL 350 MG/ML SOLN

[Series 2: axial st · axial · 0.98mm/px · z∈[+1009,+1399]mm · 14 of 92 slices shown, 16 images]
[im 7/92  soft-tissue]
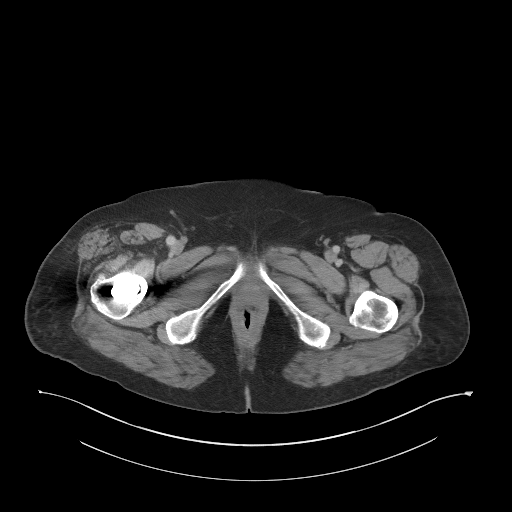
[im 7/92  bone]
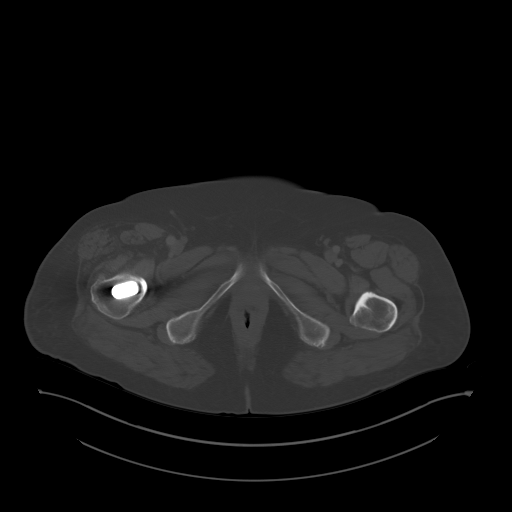
[im 13/92  soft-tissue]
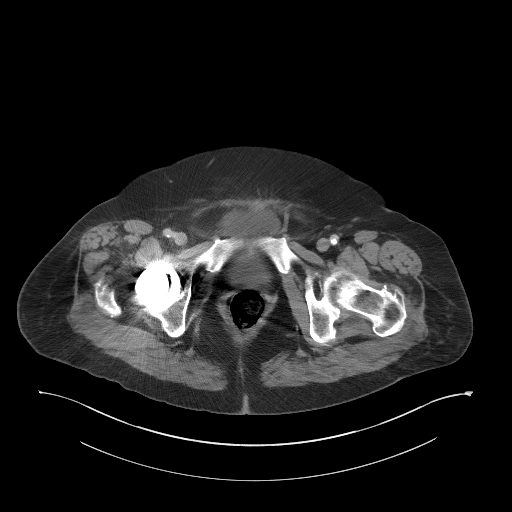
[im 19/92  soft-tissue]
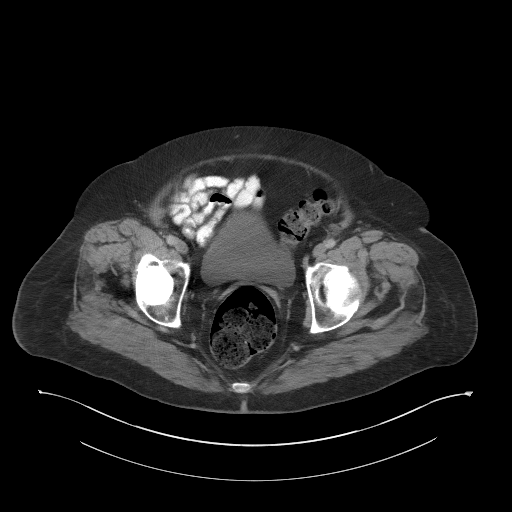
[im 25/92  soft-tissue]
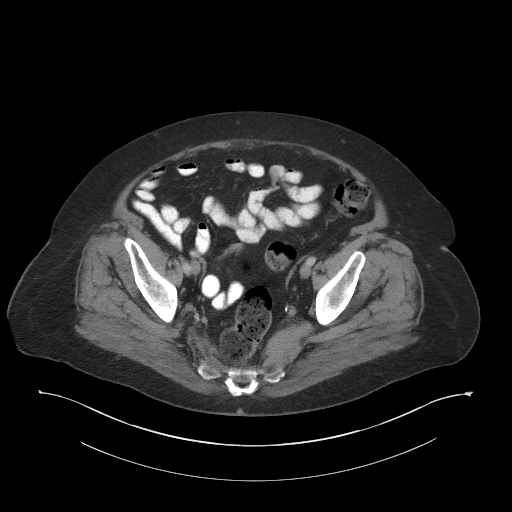
[im 31/92  soft-tissue]
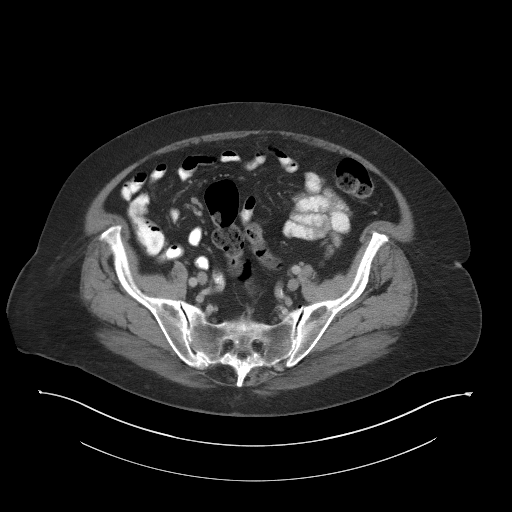
[im 37/92  soft-tissue]
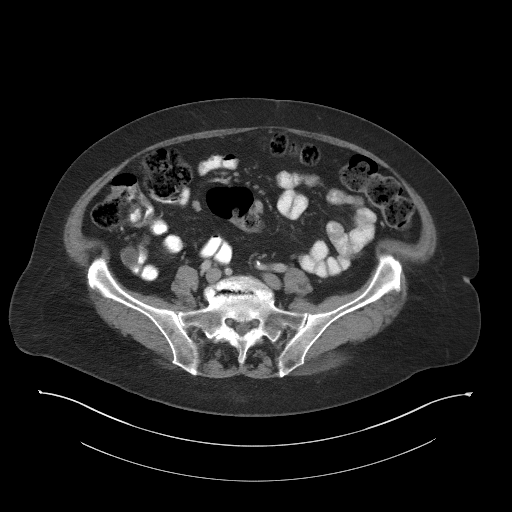
[im 43/92  soft-tissue]
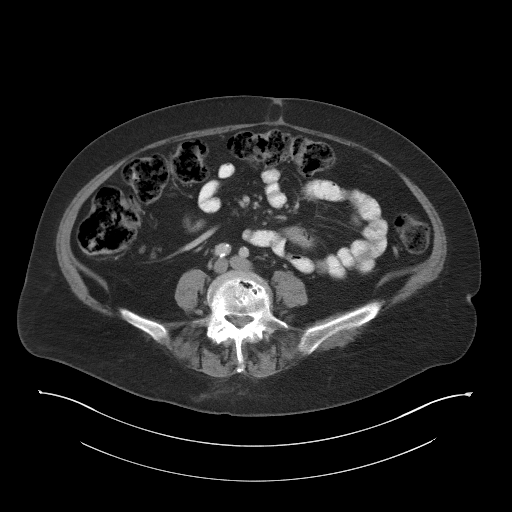
[im 49/92  soft-tissue]
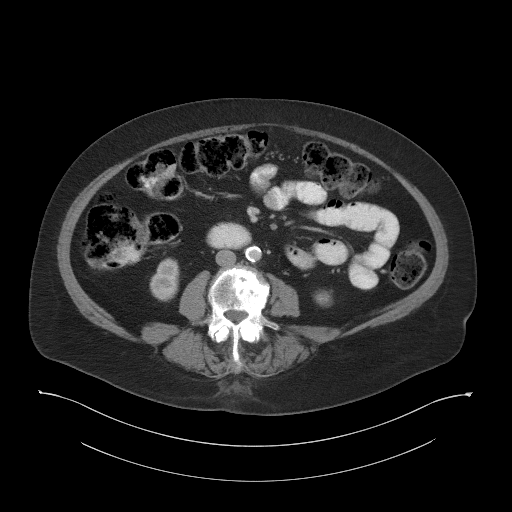
[im 55/92  soft-tissue]
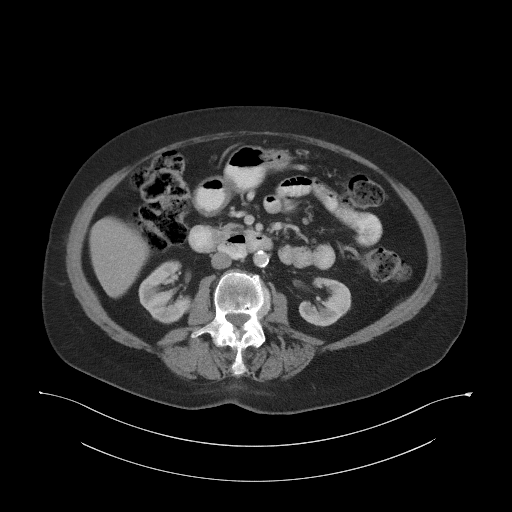
[im 55/92  bone]
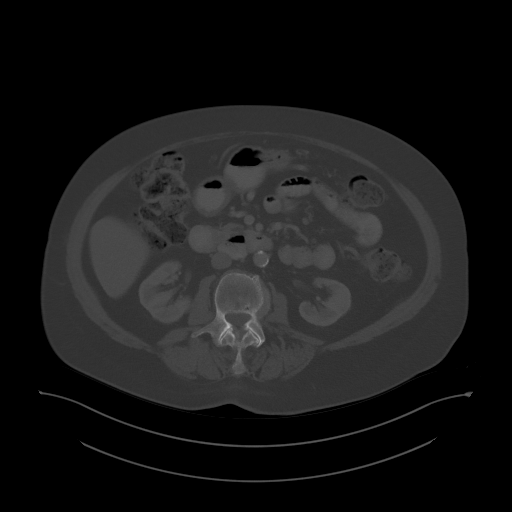
[im 61/92  soft-tissue]
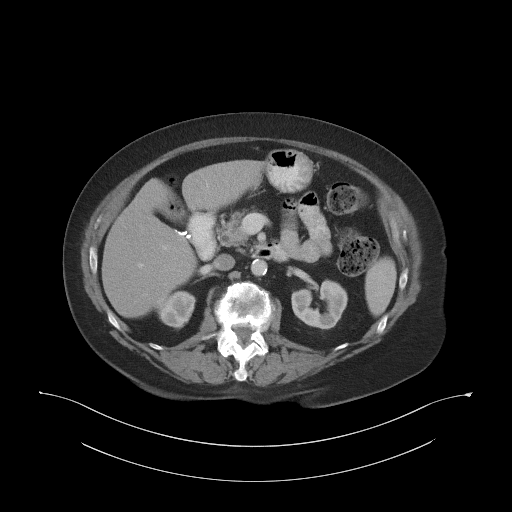
[im 67/92  soft-tissue]
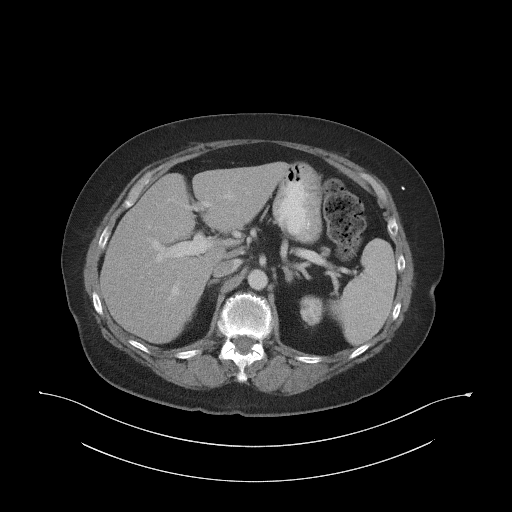
[im 73/92  soft-tissue]
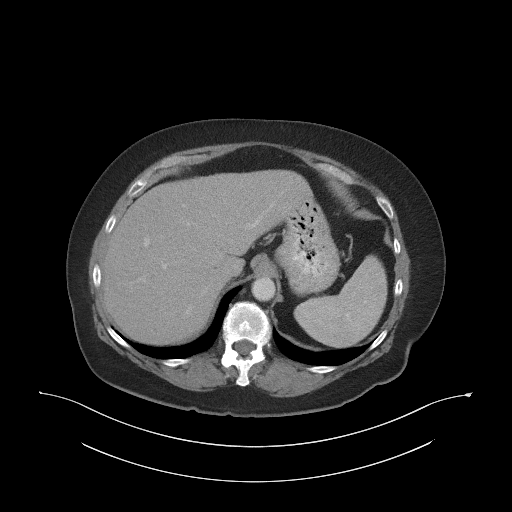
[im 79/92  soft-tissue]
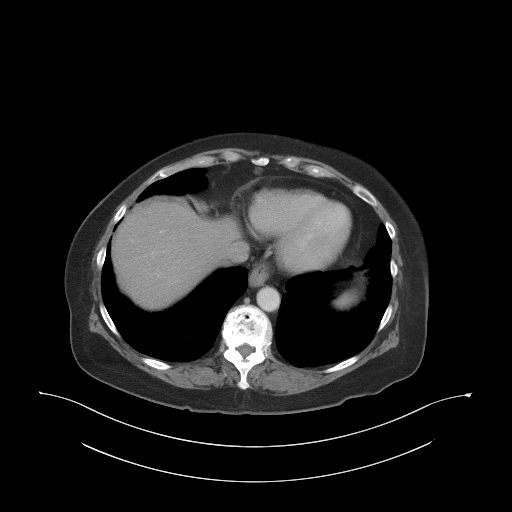
[im 85/92  soft-tissue]
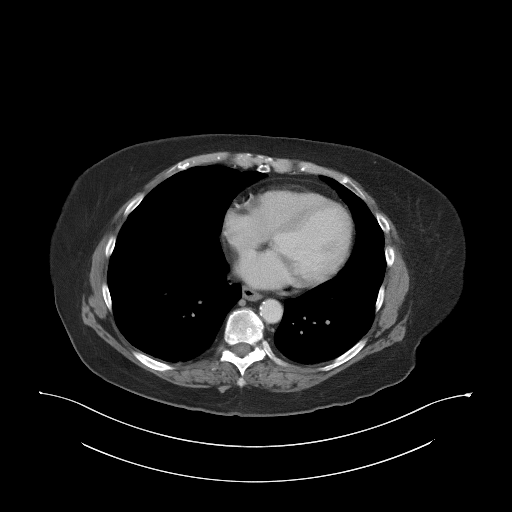

[Series 5: coronal st · coronal · 0.91mm/px · 3 of 101 slices shown]
[im 34/101  soft-tissue]
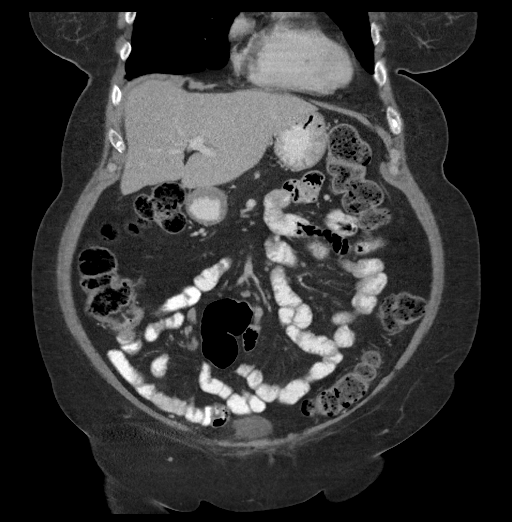
[im 45/101  soft-tissue]
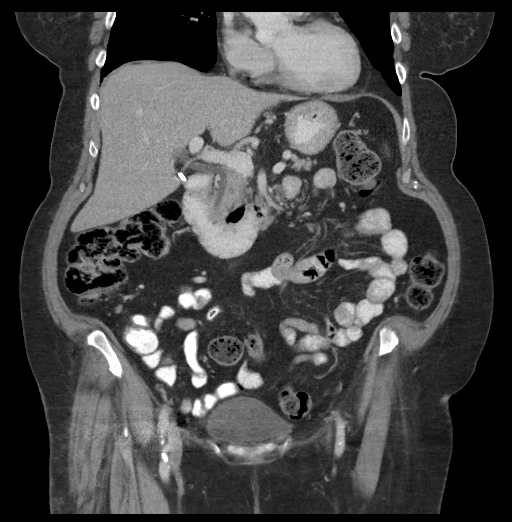
[im 56/101  soft-tissue]
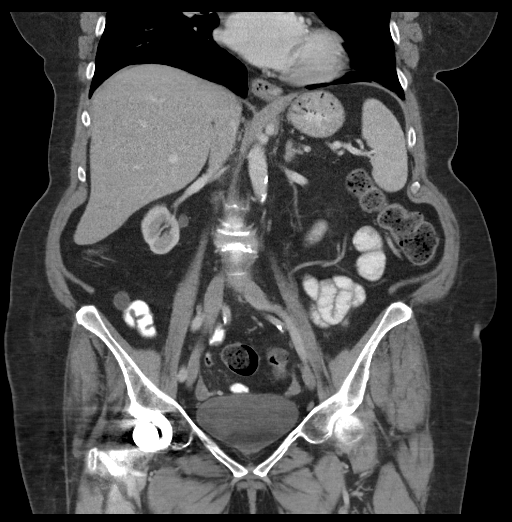

[17 of 46 positions shown; findings below may reference images not displayed]

FINDINGS: Lower Chest: No acute findings.

Hepatobiliary: No hepatic masses identified. Prior cholecystectomy.
No evidence of biliary obstruction.

Pancreas:  No mass or inflammatory changes.

Spleen: Within normal limits in size and appearance.

Adrenals/Urinary Tract: No masses identified. No evidence of
ureteral calculi or hydronephrosis.

Stomach/Bowel: No evidence of obstruction, inflammatory process or
abnormal fluid collections. Mildly dilated fluid-filled appendix is
seen. This now measures 17 mm in diameter, compared to 12 mm
previously. There is no evidence of periappendiceal inflammatory
changes. This is consistent with a mucocele of the appendix.

Vascular/Lymphatic: No pathologically enlarged lymph nodes. No acute
vascular findings. Aortic atherosclerotic calcification noted.

Reproductive: Prior hysterectomy noted. Adnexal regions are
unremarkable in appearance.

Other: No evidence of peritoneal nodularity or ascites. Tiny
umbilical hernia is seen, which contains only fat.

Musculoskeletal: No suspicious bone lesions identified. Right hip
prosthesis again noted.
IMPRESSION: Mild enlargement of mucocele of the appendix. Surgical consultation
is recommended.

No radiographic evidence of appendicitis or other acute findings.

Aortic Atherosclerosis ([V0]-[V0]).

## 2021-06-26 MED ORDER — IOHEXOL 350 MG/ML SOLN
80.0000 mL | Freq: Once | INTRAVENOUS | Status: AC | PRN
  Administered 2021-06-26: 65 mL via INTRAVENOUS

## 2021-06-26 MED ORDER — DIATRIZOATE MEGLUMINE & SODIUM 66-10 % PO SOLN
30.0000 mL | Freq: Once | ORAL | Status: AC
  Administered 2021-06-26: 30 mL via ORAL

## 2021-07-08 ENCOUNTER — Telehealth: Payer: Self-pay

## 2021-07-08 NOTE — Telephone Encounter (Signed)
Page Spiro Naef to [facility] (supporting [name], NP)     9:00 AM Re enlarged appendix on scan:  I've been on the internet  (you are permitted a small eye roll here) and am somewhat concerned.    I feel the need for more information.   -- I assume that since I was not called last week for an appointment with the surgeon, this is not  a situation in which time is of the essence.  Am I right? -- Has the surgeon taken over my treatment for this condition?  I'm sort of surprised that a surgeon is apparently the one to explain the situation to me, rather than a gastroenterologist. -- I assume I will need to have a  new colonoscopy at some point.  Is the decision whether to do this going to be based on the appendix findings, or would it be done before or during the surgery? Thanks for any information you are able to give me   Cassidy Garcia 1945-12-18  Let pt know again Dr. Pearletha Furl recommendation for appendectomy due to possible cancer formation.(See CT result note). Pt also wanting to know if she needs another Colon. Please advise.

## 2021-07-11 NOTE — Telephone Encounter (Signed)
The patient saw Jaclyn Shaggy. The CT was follow up from earlier this year. An enlarging mucocele is a surgical matter. The appendix is not visualized during colonoscopy. Her last colonoscopy was 2018. A repeat exam is not needed. My prior comments (below), stand. The surgical office should have been given her information and they call the patient with an appointment. One of my nurses can check on this for you. Thanks for reaching out.  "Vaughan Basta, Please let the patient know that she has an enlarging mucocele of the appendix. There is a risk of malignancy is some cases, thus they should be removed (appendectomy) surgically. Please arrange surgical consultation. Thanks"  Docia Chuck. Geri Seminole., M.D. [facility] Division of Gastroenterology

## 2021-07-13 ENCOUNTER — Ambulatory Visit: Payer: Self-pay | Admitting: Surgery

## 2021-07-13 DIAGNOSIS — R739 Hyperglycemia, unspecified: Secondary | ICD-10-CM

## 2021-07-13 DIAGNOSIS — Z6833 Body mass index (BMI) 33.0-33.9, adult: Secondary | ICD-10-CM | POA: Diagnosis not present

## 2021-07-13 DIAGNOSIS — Z86010 Personal history of colonic polyps: Secondary | ICD-10-CM | POA: Insufficient documentation

## 2021-07-13 DIAGNOSIS — K388 Other specified diseases of appendix: Secondary | ICD-10-CM | POA: Diagnosis not present

## 2021-07-13 NOTE — Telephone Encounter (Signed)
Left message on machine to call back  

## 2021-07-15 NOTE — Telephone Encounter (Signed)
Pt aware of results and recommendations per Dr. Henrene Pastor, she thanked [place] for the call.

## 2021-07-27 DIAGNOSIS — E039 Hypothyroidism, unspecified: Secondary | ICD-10-CM | POA: Diagnosis not present

## 2021-07-27 DIAGNOSIS — I10 Essential (primary) hypertension: Secondary | ICD-10-CM | POA: Diagnosis not present

## 2021-07-27 DIAGNOSIS — E785 Hyperlipidemia, unspecified: Secondary | ICD-10-CM | POA: Diagnosis not present

## 2021-07-27 DIAGNOSIS — R5383 Other fatigue: Secondary | ICD-10-CM | POA: Diagnosis not present

## 2021-08-03 DIAGNOSIS — R82998 Other abnormal findings in urine: Secondary | ICD-10-CM | POA: Diagnosis not present

## 2021-08-03 DIAGNOSIS — M1990 Unspecified osteoarthritis, unspecified site: Secondary | ICD-10-CM | POA: Diagnosis not present

## 2021-08-03 DIAGNOSIS — Z0000 Encounter for general adult medical examination without abnormal findings: Secondary | ICD-10-CM | POA: Diagnosis not present

## 2021-08-03 DIAGNOSIS — I129 Hypertensive chronic kidney disease with stage 1 through stage 4 chronic kidney disease, or unspecified chronic kidney disease: Secondary | ICD-10-CM | POA: Diagnosis not present

## 2021-08-03 DIAGNOSIS — E669 Obesity, unspecified: Secondary | ICD-10-CM | POA: Diagnosis not present

## 2021-08-03 DIAGNOSIS — I10 Essential (primary) hypertension: Secondary | ICD-10-CM | POA: Diagnosis not present

## 2021-08-03 DIAGNOSIS — Z1212 Encounter for screening for malignant neoplasm of rectum: Secondary | ICD-10-CM | POA: Diagnosis not present

## 2021-08-03 DIAGNOSIS — E039 Hypothyroidism, unspecified: Secondary | ICD-10-CM | POA: Diagnosis not present

## 2021-08-03 DIAGNOSIS — N1831 Chronic kidney disease, stage 3a: Secondary | ICD-10-CM | POA: Diagnosis not present

## 2021-08-03 DIAGNOSIS — E785 Hyperlipidemia, unspecified: Secondary | ICD-10-CM | POA: Diagnosis not present

## 2021-08-03 DIAGNOSIS — Z1339 Encounter for screening examination for other mental health and behavioral disorders: Secondary | ICD-10-CM | POA: Diagnosis not present

## 2021-08-03 DIAGNOSIS — Z1331 Encounter for screening for depression: Secondary | ICD-10-CM | POA: Diagnosis not present

## 2021-08-05 ENCOUNTER — Other Ambulatory Visit (HOSPITAL_COMMUNITY): Payer: Self-pay

## 2021-08-05 DIAGNOSIS — Z1231 Encounter for screening mammogram for malignant neoplasm of breast: Secondary | ICD-10-CM | POA: Diagnosis not present

## 2021-08-11 NOTE — Patient Instructions (Signed)
DUE TO COVID-19 ONLY ONE VISITOR IS ALLOWED TO COME WITH YOU AND STAY IN THE WAITING ROOM ONLY DURING PRE OP AND PROCEDURE.   **NO VISITORS ARE ALLOWED IN THE SHORT STAY AREA OR RECOVERY ROOM!!**  IF YOU WILL BE ADMITTED INTO THE HOSPITAL YOU ARE ALLOWED ONLY TWO SUPPORT PEOPLE DURING VISITATION HOURS ONLY (7 AM -8PM)   The support person(s) must pass our screening, gel in and out, and wear a mask at all times, including in the patients room. Patients must also wear a mask when staff or their support person are in the room. Visitors GUEST BADGE MUST BE WORN VISIBLY  One adult visitor may remain with you overnight and MUST be in the room by 8 P.M.  No visitors under the age of 16. Any visitor under the age of 70 must be accompanied by an adult.    COVID SWAB TESTING MUST BE COMPLETED ON:  08/24/21 @ 9:30 AM at [facility] in through main entrance. Be sit in the lobby area to the right as you come in the main entrance. Dial [phone] give them your name and let them know you are here for COVID testing.    Your procedure is scheduled on: 08/26/21   Report to [facility] Main Entrance    Report to admitting at: 6:15 AM   Call this number if you have problems the morning of surgery [phone]    DRINK 2 PRESURGERY ENSURE DRINKS THE NIGHT BEFORE SURGERY AT  1000 PM AND 1 PRESURGERY DRINK THE DAY OF THE PROCEDURE 3 HOURS PRIOR TO SCHEDULED SURGERY. NO SOLIDS AFTER MIDNIGHT THE DAY PRIOR TO THE SURGERY. NOTHING BY MOUTH EXCEPT CLEAR LIQUIDS UNTIL THREE HOURS PRIOR TO SCHEDULED SURGERY. PLEASE FINISH PRESURGERY ENSURE DRINK PER SURGEON ORDER 3 HOURS PRIOR TO SCHEDULED SURGERY TIME WHICH NEEDS TO BE COMPLETED AT: 5:30 AM.   CLEAR LIQUID DIET : STARTING THE DAY BEFORE SURGERY UNTIL 5:30 AM.  Foods Allowed                                                                     Foods Excluded  Water, Black Coffee and tea, regular and decaf                             liquids  that you cannot  Plain Jell-O in any flavor  (No red)                                           see through such as: Fruit ices (not with fruit pulp)                                     milk, soups, orange juice              Iced Popsicles (No red)  All solid food                                   Apple juices Sports drinks like Gatorade (No red) Lightly seasoned clear broth or consume(fat free) Sugar  Sample Menu Breakfast                                Lunch                                     Supper Cranberry juice                    Beef broth                            Chicken broth Jell-O                                     Grape juice                           Apple juice Coffee or tea                        Jell-O                                      Popsicle                                                Coffee or tea                        Coffee or tea    Oral Hygiene is also important to reduce your risk of infection.                                    Remember - BRUSH YOUR TEETH THE MORNING OF SURGERY WITH YOUR REGULAR TOOTHPASTE   Do NOT smoke after Midnight   Take these medicines the morning of surgery with A SIP OF WATER: elavil,verapamil,synthroid,famotidine,omeprazole.Use inhalers as usual.  DO NOT TAKE ANY ORAL DIABETIC MEDICATIONS DAY OF YOUR SURGERY                              You may not have any metal on your body including hair pins, jewelry, and body piercing             Do not wear make-up, lotions, powders, perfumes/cologne, or deodorant  Do not wear nail polish including gel and S&S, artificial/acrylic nails, or any other type of covering on natural nails including finger and toenails. If you have artificial nails, gel coating, etc. that needs to be removed by a nail salon please have this removed prior to surgery  or surgery may need to be canceled/ delayed if the surgeon/ anesthesia feels like they are unable to be safely  monitored.   Do not shave  48 hours prior to surgery.    Do not bring valuables to the hospital. [name].   Contacts, dentures or bridgework may not be worn into surgery.   Bring small overnight bag day of surgery.    Patients discharged on the day of surgery will not be allowed to drive home.   Special Instructions: Bring a copy of your healthcare power of attorney and living will documents         the day of surgery if you haven't scanned them before.              Please read over the following fact sheets you were given: IF YOU HAVE QUESTIONS ABOUT YOUR PRE-OP INSTRUCTIONS PLEASE CALL [phone]     [facility] - Preparing for Surgery Before surgery, you can play an important role.  Because skin is not sterile, your skin needs to be as free of germs as possible.  You can reduce the number of germs on your skin by washing with CHG (chlorahexidine gluconate) soap before surgery.  CHG is an antiseptic cleaner which kills germs and bonds with the skin to continue killing germs even after washing. Please DO NOT use if you have an allergy to CHG or antibacterial soaps.  If your skin becomes reddened/irritated stop using the CHG and inform your nurse when you arrive at Short Stay. Do not shave (including legs and underarms) for at least 48 hours prior to the first CHG shower.  You may shave your face/neck. Please follow these instructions carefully:  1.  Shower with CHG Soap the night before surgery and the  morning of Surgery.  2.  If you choose to wash your hair, wash your hair first as usual with your  normal  shampoo.  3.  After you shampoo, rinse your hair and body thoroughly to remove the  shampoo.                           4.  Use CHG as you would any other liquid soap.  You can apply chg directly  to the skin and wash                       Gently with a scrungie or clean washcloth.  5.  Apply the CHG Soap to your body ONLY FROM THE  NECK DOWN.   Do not use on face/ open                           Wound or open sores. Avoid contact with eyes, ears mouth and genitals (private parts).                       Wash face,  Genitals (private parts) with your normal soap.             6.  Wash thoroughly, paying special attention to the area where your surgery  will be performed.  7.  Thoroughly rinse your body with warm water from the neck down.  8.  DO NOT shower/wash with your normal soap after using and rinsing off  the  CHG Soap.                9.  Pat yourself dry with a clean towel.            10.  Wear clean pajamas.            11.  Place clean sheets on your bed the night of your first shower and do not  sleep with pets. Day of Surgery : Do not apply any lotions/deodorants the morning of surgery.  Please wear clean clothes to the hospital/surgery center.  FAILURE TO FOLLOW THESE INSTRUCTIONS MAY RESULT IN THE CANCELLATION OF YOUR SURGERY PATIENT SIGNATURE_________________________________  NURSE SIGNATURE__________________________________  ________________________________________________________________________   Cassidy Garcia  An incentive spirometer is a tool that can help keep your lungs clear and active. This tool measures how well you are filling your lungs with each breath. Taking long deep breaths may help reverse or decrease the chance of developing breathing (pulmonary) problems (especially infection) following: A long period of time when you are unable to move or be active. BEFORE THE PROCEDURE  If the spirometer includes an indicator to show your best effort, your nurse or respiratory therapist will set it to a desired goal. If possible, sit up straight or lean slightly forward. Try not to slouch. Hold the incentive spirometer in an upright position. INSTRUCTIONS FOR USE  Sit on the edge of your bed if possible, or sit up as far as you can in bed or on a chair. Hold the incentive spirometer in an  upright position. Breathe out normally. Place the mouthpiece in your mouth and seal your lips tightly around it. Breathe in slowly and as deeply as possible, raising the piston or the ball toward the top of the column. Hold your breath for 3-5 seconds or for as long as possible. Allow the piston or ball to fall to the bottom of the column. Remove the mouthpiece from your mouth and breathe out normally. Rest for a few seconds and repeat Steps 1 through 7 at least 10 times every 1-2 hours when you are awake. Take your time and take a few normal breaths between deep breaths. The spirometer may include an indicator to show your best effort. Use the indicator as a goal to work toward during each repetition. After each set of 10 deep breaths, practice coughing to be sure your lungs are clear. If you have an incision (the cut made at the time of surgery), support your incision when coughing by placing a pillow or rolled up towels firmly against it. Once you are able to get out of bed, walk around indoors and cough well. You may stop using the incentive spirometer when instructed by your caregiver.  RISKS AND COMPLICATIONS Take your time so you do not get dizzy or light-headed. If you are in pain, you may need to take or ask for pain medication before doing incentive spirometry. It is harder to take a deep breath if you are having pain. AFTER USE Rest and breathe slowly and easily. It can be helpful to keep track of a log of your progress. Your caregiver can provide you with a simple table to help with this. If you are using the spirometer at home, follow these instructions: [place] IF:  You are having difficultly using the spirometer. You have trouble using the spirometer as often as instructed. Your pain medication is not giving enough relief while using the spirometer. You develop fever of 100.5 F (38.1  C) or higher. SEEK IMMEDIATE MEDICAL CARE IF:  You cough up bloody sputum that had  not been present before. You develop fever of 102 F (38.9 C) or greater. You develop worsening pain at or near the incision site. MAKE SURE YOU:  Understand these instructions. Will watch your condition. Will get help right away if you are not doing well or get worse. Document Released: 12/13/2006 Document Revised: 10/25/2011 Document Reviewed: 02/13/2007 [facility] Patient Information 2014 [place], [place].   ________________________________________________________________________

## 2021-08-12 ENCOUNTER — Other Ambulatory Visit: Payer: Self-pay

## 2021-08-12 ENCOUNTER — Encounter (HOSPITAL_COMMUNITY): Payer: Self-pay

## 2021-08-12 ENCOUNTER — Encounter (HOSPITAL_COMMUNITY)
Admission: RE | Admit: 2021-08-12 | Discharge: 2021-08-12 | Disposition: A | Discharge status: Home / Self Care | Payer: Medicare Other | Source: Ambulatory Visit | Attending: Surgery | Admitting: Surgery

## 2021-08-12 VITALS — BP 141/73 | HR 91 | Temp 98.3°F | Resp 18 | Ht 65.0 in | Wt 201.5 lb

## 2021-08-12 DIAGNOSIS — Z01818 Encounter for other preprocedural examination: Secondary | ICD-10-CM | POA: Insufficient documentation

## 2021-08-12 DIAGNOSIS — I10 Essential (primary) hypertension: Secondary | ICD-10-CM | POA: Insufficient documentation

## 2021-08-12 DIAGNOSIS — R739 Hyperglycemia, unspecified: Secondary | ICD-10-CM | POA: Diagnosis not present

## 2021-08-12 LAB — CBC
HCT: 36.1 % (ref 36.0–46.0)
Hemoglobin: 11.8 g/dL — ABNORMAL LOW (ref 12.0–15.0)
MCH: 31.8 pg (ref 26.0–34.0)
MCHC: 32.7 g/dL (ref 30.0–36.0)
MCV: 97.3 fL (ref 80.0–100.0)
Platelets: 186 10*3/uL (ref 150–400)
RBC: 3.71 MIL/uL — ABNORMAL LOW (ref 3.87–5.11)
RDW: 12.7 % (ref 11.5–15.5)
WBC: 6.1 10*3/uL (ref 4.0–10.5)
nRBC: 0 % (ref 0.0–0.2)

## 2021-08-12 LAB — SURGICAL PCR SCREEN
MRSA, PCR: NEGATIVE
Staphylococcus aureus: NEGATIVE

## 2021-08-12 LAB — BASIC METABOLIC PANEL
Anion gap: 8 (ref 5–15)
BUN: 34 mg/dL — ABNORMAL HIGH (ref 8–23)
CO2: 23 mmol/L (ref 22–32)
Calcium: 8.6 mg/dL — ABNORMAL LOW (ref 8.9–10.3)
Chloride: 109 mmol/L (ref 98–111)
Creatinine, Ser: 1.3 mg/dL — ABNORMAL HIGH (ref 0.44–1.00)
GFR, Estimated: 43 mL/min — ABNORMAL LOW (ref >60)
Glucose, Bld: 116 mg/dL — ABNORMAL HIGH (ref 70–99)
Potassium: 4.5 mmol/L (ref 3.5–5.1)
Sodium: 140 mmol/L (ref 135–145)

## 2021-08-12 LAB — HEMOGLOBIN A1C
Hgb A1c MFr Bld: 5.3 % (ref 4.8–5.6)
Mean Plasma Glucose: 105.41 mg/dL

## 2021-08-12 NOTE — Progress Notes (Signed)
COVID Vaccine Completed: Yes Date COVID Vaccine completed: 05/05/21 x 5 COVID vaccine manufacturer: [place] test: 08/24/21  PCP - Dr. Burnard Bunting Cardiologist -   Chest x-ray -  EKG -  Stress Test -  ECHO -  Cardiac Cath -  Pacemaker/ICD device last checked:  Sleep Study -  CPAP -   Fasting Blood Sugar -  Checks Blood Sugar _____ times a day  Blood Thinner Instructions: Aspirin Instructions: Last Dose:  Anesthesia review: Hx: HTN,CKD(III)  Patient denies shortness of breath, fever, cough and chest pain at PAT appointment   Patient verbalized understanding of instructions that were given to them at the PAT appointment. Patient was also instructed that they will need to review over the PAT instructions again at home before surgery.

## 2021-08-24 ENCOUNTER — Other Ambulatory Visit: Payer: Self-pay

## 2021-08-24 ENCOUNTER — Encounter (HOSPITAL_COMMUNITY)
Admission: RE | Admit: 2021-08-24 | Discharge: 2021-08-24 | Disposition: A | Discharge status: Home / Self Care | Payer: Medicare Other | Source: Ambulatory Visit | Attending: Surgery | Admitting: Surgery

## 2021-08-24 DIAGNOSIS — Z01812 Encounter for preprocedural laboratory examination: Secondary | ICD-10-CM | POA: Diagnosis not present

## 2021-08-24 DIAGNOSIS — Z20822 Contact with and (suspected) exposure to covid-19: Secondary | ICD-10-CM | POA: Insufficient documentation

## 2021-08-24 DIAGNOSIS — Z01818 Encounter for other preprocedural examination: Secondary | ICD-10-CM

## 2021-08-24 LAB — SARS CORONAVIRUS 2 (TAT 6-24 HRS): SARS Coronavirus 2: NEGATIVE

## 2021-08-26 ENCOUNTER — Ambulatory Visit (HOSPITAL_COMMUNITY)
Admission: RE | Admit: 2021-08-26 | Discharge: 2021-08-26 | Disposition: A | Discharge status: Home / Self Care | Payer: Medicare Other | Attending: Surgery | Admitting: Surgery

## 2021-08-26 ENCOUNTER — Inpatient Hospital Stay (HOSPITAL_COMMUNITY): Payer: Medicare Other | Admitting: Physician Assistant

## 2021-08-26 ENCOUNTER — Encounter (HOSPITAL_COMMUNITY)
Admission: RE | Disposition: A | Discharge status: Home / Self Care | Payer: Self-pay | Source: Home / Self Care | Attending: Surgery

## 2021-08-26 ENCOUNTER — Inpatient Hospital Stay (HOSPITAL_COMMUNITY): Payer: Medicare Other | Admitting: Certified Registered"

## 2021-08-26 ENCOUNTER — Encounter (HOSPITAL_COMMUNITY): Payer: Self-pay | Admitting: Surgery

## 2021-08-26 DIAGNOSIS — M5431 Sciatica, right side: Secondary | ICD-10-CM | POA: Diagnosis not present

## 2021-08-26 DIAGNOSIS — E039 Hypothyroidism, unspecified: Secondary | ICD-10-CM | POA: Insufficient documentation

## 2021-08-26 DIAGNOSIS — Z87891 Personal history of nicotine dependence: Secondary | ICD-10-CM | POA: Diagnosis not present

## 2021-08-26 DIAGNOSIS — M25569 Pain in unspecified knee: Secondary | ICD-10-CM | POA: Diagnosis not present

## 2021-08-26 DIAGNOSIS — Z9049 Acquired absence of other specified parts of digestive tract: Secondary | ICD-10-CM | POA: Insufficient documentation

## 2021-08-26 DIAGNOSIS — K219 Gastro-esophageal reflux disease without esophagitis: Secondary | ICD-10-CM | POA: Diagnosis not present

## 2021-08-26 DIAGNOSIS — K388 Other specified diseases of appendix: Secondary | ICD-10-CM | POA: Diagnosis not present

## 2021-08-26 DIAGNOSIS — C181 Malignant neoplasm of appendix: Secondary | ICD-10-CM | POA: Diagnosis not present

## 2021-08-26 DIAGNOSIS — K3580 Unspecified acute appendicitis: Secondary | ICD-10-CM | POA: Diagnosis not present

## 2021-08-26 DIAGNOSIS — F32A Depression, unspecified: Secondary | ICD-10-CM | POA: Insufficient documentation

## 2021-08-26 DIAGNOSIS — Z90710 Acquired absence of both cervix and uterus: Secondary | ICD-10-CM | POA: Insufficient documentation

## 2021-08-26 DIAGNOSIS — J45909 Unspecified asthma, uncomplicated: Secondary | ICD-10-CM | POA: Insufficient documentation

## 2021-08-26 DIAGNOSIS — M1990 Unspecified osteoarthritis, unspecified site: Secondary | ICD-10-CM | POA: Insufficient documentation

## 2021-08-26 DIAGNOSIS — K660 Peritoneal adhesions (postprocedural) (postinfection): Secondary | ICD-10-CM | POA: Diagnosis not present

## 2021-08-26 DIAGNOSIS — Z01818 Encounter for other preprocedural examination: Secondary | ICD-10-CM

## 2021-08-26 DIAGNOSIS — D490 Neoplasm of unspecified behavior of digestive system: Secondary | ICD-10-CM | POA: Diagnosis not present

## 2021-08-26 DIAGNOSIS — I10 Essential (primary) hypertension: Secondary | ICD-10-CM | POA: Insufficient documentation

## 2021-08-26 DIAGNOSIS — R7989 Other specified abnormal findings of blood chemistry: Secondary | ICD-10-CM | POA: Diagnosis not present

## 2021-08-26 DIAGNOSIS — Z86010 Personal history of colonic polyps: Secondary | ICD-10-CM | POA: Diagnosis not present

## 2021-08-26 HISTORY — PX: LAPAROSCOPIC APPENDECTOMY: SHX408

## 2021-08-26 LAB — TYPE AND SCREEN
ABO/RH(D): B POS
Antibody Screen: NEGATIVE

## 2021-08-26 SURGERY — APPENDECTOMY, LAPAROSCOPIC
Anesthesia: General | Site: Abdomen

## 2021-08-26 MED ORDER — SUGAMMADEX SODIUM 200 MG/2ML IV SOLN
INTRAVENOUS | Status: DC | PRN
  Administered 2021-08-26: 200 mg via INTRAVENOUS

## 2021-08-26 MED ORDER — ACETAMINOPHEN 160 MG/5ML PO SOLN: 325.0000 mg | ORAL | Status: DC | PRN

## 2021-08-26 MED ORDER — BUPIVACAINE-EPINEPHRINE (PF) 0.25% -1:200000 IJ SOLN
INTRAMUSCULAR | Status: AC
  Filled 2021-08-26: qty 60

## 2021-08-26 MED ORDER — DEXAMETHASONE SODIUM PHOSPHATE 10 MG/ML IJ SOLN
INTRAMUSCULAR | Status: DC | PRN
  Administered 2021-08-26: 4 mg via INTRAVENOUS

## 2021-08-26 MED ORDER — LACTATED RINGERS IV SOLN: INTRAVENOUS | Status: DC

## 2021-08-26 MED ORDER — PROPOFOL 10 MG/ML IV BOLUS
INTRAVENOUS | Status: DC | PRN
  Administered 2021-08-26: 130 mg via INTRAVENOUS

## 2021-08-26 MED ORDER — STERILE WATER FOR IRRIGATION IR SOLN
Status: DC | PRN
  Administered 2021-08-26: 1000 mL

## 2021-08-26 MED ORDER — ENOXAPARIN SODIUM 40 MG/0.4ML IJ SOSY
40.0000 mg | PREFILLED_SYRINGE | Freq: Once | INTRAMUSCULAR | Status: AC
  Administered 2021-08-26: 40 mg via SUBCUTANEOUS
  Filled 2021-08-26: qty 0.4

## 2021-08-26 MED ORDER — PROMETHAZINE HCL 25 MG/ML IJ SOLN: 6.2500 mg | INTRAMUSCULAR | Status: DC | PRN

## 2021-08-26 MED ORDER — LIDOCAINE 2% (20 MG/ML) 5 ML SYRINGE
INTRAMUSCULAR | Status: AC
  Filled 2021-08-26: qty 5

## 2021-08-26 MED ORDER — FENTANYL CITRATE PF 50 MCG/ML IJ SOSY: 25.0000 ug | PREFILLED_SYRINGE | INTRAMUSCULAR | Status: DC | PRN

## 2021-08-26 MED ORDER — FENTANYL CITRATE (PF) 100 MCG/2ML IJ SOLN
INTRAMUSCULAR | Status: AC
  Filled 2021-08-26: qty 2

## 2021-08-26 MED ORDER — FENTANYL CITRATE (PF) 250 MCG/5ML IJ SOLN
INTRAMUSCULAR | Status: DC | PRN
Start: 2021-08-26 — End: 2021-08-26
  Administered 2021-08-26 (×4): 50 ug via INTRAVENOUS

## 2021-08-26 MED ORDER — OXYCODONE HCL 5 MG PO TABS: 5.0000 mg | ORAL_TABLET | Freq: Once | ORAL | Status: DC | PRN

## 2021-08-26 MED ORDER — LIDOCAINE 2% (20 MG/ML) 5 ML SYRINGE
INTRAMUSCULAR | Status: DC | PRN
  Administered 2021-08-26: 40 mg via INTRAVENOUS

## 2021-08-26 MED ORDER — BUPIVACAINE LIPOSOME 1.3 % IJ SUSP: 20.0000 mL | Freq: Once | INTRAMUSCULAR | Status: DC

## 2021-08-26 MED ORDER — ONDANSETRON HCL 4 MG/2ML IJ SOLN
INTRAMUSCULAR | Status: AC
  Filled 2021-08-26: qty 2

## 2021-08-26 MED ORDER — ONDANSETRON HCL 4 MG/2ML IJ SOLN
INTRAMUSCULAR | Status: DC | PRN
  Administered 2021-08-26: 4 mg via INTRAVENOUS

## 2021-08-26 MED ORDER — ENSURE PRE-SURGERY PO LIQD
592.0000 mL | Freq: Once | ORAL | Status: AC
  Administered 2021-08-26: 592 mL via ORAL
  Filled 2021-08-26: qty 592

## 2021-08-26 MED ORDER — BUPIVACAINE LIPOSOME 1.3 % IJ SUSP
INTRAMUSCULAR | Status: DC | PRN
  Administered 2021-08-26: 20 mL

## 2021-08-26 MED ORDER — BUPIVACAINE LIPOSOME 1.3 % IJ SUSP
INTRAMUSCULAR | Status: AC
  Filled 2021-08-26: qty 20

## 2021-08-26 MED ORDER — CHLORHEXIDINE GLUCONATE 0.12 % MT SOLN: 15.0000 mL | Freq: Once | OROMUCOSAL | Status: AC

## 2021-08-26 MED ORDER — PROPOFOL 10 MG/ML IV BOLUS
INTRAVENOUS | Status: AC
  Filled 2021-08-26: qty 20

## 2021-08-26 MED ORDER — ALVIMOPAN 12 MG PO CAPS
12.0000 mg | ORAL_CAPSULE | ORAL | Status: AC
  Administered 2021-08-26: 12 mg via ORAL
  Filled 2021-08-26: qty 1

## 2021-08-26 MED ORDER — ACETAMINOPHEN 500 MG PO TABS
1000.0000 mg | ORAL_TABLET | ORAL | Status: AC
  Administered 2021-08-26: 1000 mg via ORAL
  Filled 2021-08-26: qty 2

## 2021-08-26 MED ORDER — METRONIDAZOLE 500 MG PO TABS: 1000.0000 mg | ORAL_TABLET | ORAL | Status: DC

## 2021-08-26 MED ORDER — ACETAMINOPHEN 325 MG PO TABS: 325.0000 mg | ORAL_TABLET | ORAL | Status: DC | PRN

## 2021-08-26 MED ORDER — GABAPENTIN 300 MG PO CAPS
300.0000 mg | ORAL_CAPSULE | ORAL | Status: AC
  Administered 2021-08-26: 300 mg via ORAL
  Filled 2021-08-26: qty 1

## 2021-08-26 MED ORDER — ACETAMINOPHEN 10 MG/ML IV SOLN: 1000.0000 mg | Freq: Once | INTRAVENOUS | Status: DC | PRN

## 2021-08-26 MED ORDER — SODIUM CHLORIDE 0.9 % IV SOLN
2.0000 g | INTRAVENOUS | Status: AC
  Administered 2021-08-26: 2 g via INTRAVENOUS
  Filled 2021-08-26: qty 2

## 2021-08-26 MED ORDER — NEOMYCIN SULFATE 500 MG PO TABS: 1000.0000 mg | ORAL_TABLET | ORAL | Status: DC

## 2021-08-26 MED ORDER — DEXAMETHASONE SODIUM PHOSPHATE 10 MG/ML IJ SOLN
INTRAMUSCULAR | Status: AC
  Filled 2021-08-26: qty 1

## 2021-08-26 MED ORDER — ROCURONIUM BROMIDE 10 MG/ML (PF) SYRINGE
PREFILLED_SYRINGE | INTRAVENOUS | Status: AC
  Filled 2021-08-26: qty 10

## 2021-08-26 MED ORDER — BISACODYL 5 MG PO TBEC
20.0000 mg | DELAYED_RELEASE_TABLET | Freq: Once | ORAL | Status: AC
  Administered 2021-08-26: 20 mg via ORAL

## 2021-08-26 MED ORDER — POLYETHYLENE GLYCOL 3350 17 GM/SCOOP PO POWD: 1.0000 | Freq: Once | ORAL | Status: DC

## 2021-08-26 MED ORDER — ORAL CARE MOUTH RINSE
15.0000 mL | Freq: Once | OROMUCOSAL | Status: AC
  Administered 2021-08-26: 15 mL via OROMUCOSAL

## 2021-08-26 MED ORDER — 0.9 % SODIUM CHLORIDE (POUR BTL) OPTIME
TOPICAL | Status: DC | PRN
  Administered 2021-08-26: 1000 mL

## 2021-08-26 MED ORDER — BUPIVACAINE-EPINEPHRINE 0.25% -1:200000 IJ SOLN
INTRAMUSCULAR | Status: DC | PRN
  Administered 2021-08-26: 60 mL

## 2021-08-26 MED ORDER — LIDOCAINE 20MG/ML (2%) 15 ML SYRINGE OPTIME
INTRAMUSCULAR | Status: DC | PRN
  Administered 2021-08-26: 1.5 mg/kg/h via INTRAVENOUS

## 2021-08-26 MED ORDER — AMISULPRIDE (ANTIEMETIC) 5 MG/2ML IV SOLN
INTRAVENOUS | Status: AC
  Filled 2021-08-26: qty 4

## 2021-08-26 MED ORDER — AMISULPRIDE (ANTIEMETIC) 5 MG/2ML IV SOLN
10.0000 mg | Freq: Once | INTRAVENOUS | Status: AC | PRN
  Administered 2021-08-26: 10 mg via INTRAVENOUS

## 2021-08-26 MED ORDER — ROCURONIUM BROMIDE 10 MG/ML (PF) SYRINGE
PREFILLED_SYRINGE | INTRAVENOUS | Status: DC | PRN
  Administered 2021-08-26: 60 mg via INTRAVENOUS

## 2021-08-26 MED ORDER — HYDROCODONE-ACETAMINOPHEN 5-325 MG PO TABS: 1.0000 | ORAL_TABLET | Freq: Four times a day (QID) | ORAL | 0 refills | Status: DC | PRN

## 2021-08-26 MED ORDER — LACTATED RINGERS IR SOLN
Status: DC | PRN
  Administered 2021-08-26: 1000 mL

## 2021-08-26 MED ORDER — OXYCODONE HCL 5 MG/5ML PO SOLN: 5.0000 mg | Freq: Once | ORAL | Status: DC | PRN

## 2021-08-26 MED ORDER — LIDOCAINE HCL 2 % IJ SOLN
INTRAMUSCULAR | Status: AC
  Filled 2021-08-26: qty 20

## 2021-08-26 MED ORDER — ENSURE PRE-SURGERY PO LIQD: 296.0000 mL | Freq: Once | ORAL | Status: DC

## 2021-08-26 SURGICAL SUPPLY — 91 items
APL PRP STRL LF DISP 70% ISPRP (MISCELLANEOUS)
APPLIER CLIP 5 13 M/L LIGAMAX5 (MISCELLANEOUS)
APPLIER CLIP ROT 10 11.4 M/L (STAPLE)
APR CLP MED LRG 11.4X10 (STAPLE)
APR CLP MED LRG 5 ANG JAW (MISCELLANEOUS)
BAG COUNTER SPONGE SURGICOUNT (BAG) IMPLANT
BAG DECANTER FOR FLEXI CONT (MISCELLANEOUS) IMPLANT
BAG SPEC RTRVL 10 TROC 200 (ENDOMECHANICALS) ×2
BAG SPNG CNTER NS LX DISP (BAG)
BLADE EXTENDED COATED 6.5IN (ELECTRODE) IMPLANT
CABLE HIGH FREQUENCY MONO STRZ (ELECTRODE) ×1 IMPLANT
CELLS DAT CNTRL 66122 CELL SVR (MISCELLANEOUS) IMPLANT
CHLORAPREP W/TINT 26 (MISCELLANEOUS) ×1 IMPLANT
CLIP APPLIE 5 13 M/L LIGAMAX5 (MISCELLANEOUS) IMPLANT
CLIP APPLIE ROT 10 11.4 M/L (STAPLE) IMPLANT
COUNTER NEEDLE 20 DBL MAG RED (NEEDLE) ×1 IMPLANT
COVER MAYO STAND STRL (DRAPES) ×3 IMPLANT
COVER SURGICAL LIGHT HANDLE (MISCELLANEOUS) ×3 IMPLANT
DECANTER SPIKE VIAL GLASS SM (MISCELLANEOUS) ×1 IMPLANT
DEVICE TROCAR PUNCTURE CLOSURE (ENDOMECHANICALS) IMPLANT
DRAIN CHANNEL 19F RND (DRAIN) IMPLANT
DRAPE LAPAROSCOPIC ABDOMINAL (DRAPES) ×3 IMPLANT
DRAPE SHEET LG 3/4 BI-LAMINATE (DRAPES) IMPLANT
DRAPE WARM FLUID 44X44 (DRAPES) ×3 IMPLANT
DRSG OPSITE POSTOP 4X10 (GAUZE/BANDAGES/DRESSINGS) IMPLANT
DRSG OPSITE POSTOP 4X6 (GAUZE/BANDAGES/DRESSINGS) IMPLANT
DRSG OPSITE POSTOP 4X8 (GAUZE/BANDAGES/DRESSINGS) IMPLANT
DRSG TEGADERM 2-3/8X2-3/4 SM (GAUZE/BANDAGES/DRESSINGS) ×6 IMPLANT
DRSG TEGADERM 4X4.75 (GAUZE/BANDAGES/DRESSINGS) ×3 IMPLANT
ELECT REM PT RETURN 15FT ADLT (MISCELLANEOUS) ×3 IMPLANT
ENDOLOOP SUT PDS II  0 18 (SUTURE)
ENDOLOOP SUT PDS II 0 18 (SUTURE) IMPLANT
EVACUATOR SILICONE 100CC (DRAIN) IMPLANT
GAUZE 4X4 16PLY ~~LOC~~+RFID DBL (SPONGE) IMPLANT
GAUZE SPONGE 2X2 8PLY STRL LF (GAUZE/BANDAGES/DRESSINGS) ×2 IMPLANT
GAUZE SPONGE 4X4 12PLY STRL (GAUZE/BANDAGES/DRESSINGS) ×3 IMPLANT
GLOVE SURG NEOPR MICRO LF SZ8 (GLOVE) ×6 IMPLANT
GLOVE SURG UNDER LTX SZ8 (GLOVE) ×6 IMPLANT
GOWN STRL REUS W/TWL XL LVL3 (GOWN DISPOSABLE) ×12 IMPLANT
IRRIG SUCT STRYKERFLOW 2 WTIP (MISCELLANEOUS) ×3
IRRIGATION SUCT STRKRFLW 2 WTP (MISCELLANEOUS) ×2 IMPLANT
KIT BASIN OR (CUSTOM PROCEDURE TRAY) ×3 IMPLANT
KIT TURNOVER KIT A (KITS) ×2 IMPLANT
LEGGING LITHOTOMY PAIR STRL (DRAPES) IMPLANT
NDL INSUFFLATION 14GA 120MM (NEEDLE) IMPLANT
NEEDLE INSUFFLATION 14GA 120MM (NEEDLE) ×3 IMPLANT
PACK COLON (CUSTOM PROCEDURE TRAY) ×3 IMPLANT
PAD POSITIONING PINK XL (MISCELLANEOUS) ×3 IMPLANT
PENCIL SMOKE EVACUATOR (MISCELLANEOUS) IMPLANT
POUCH RETRIEVAL ECOSAC 10 (ENDOMECHANICALS) ×2 IMPLANT
POUCH RETRIEVAL ECOSAC 10MM (ENDOMECHANICALS) ×3
RELOAD STAPLE 60 3.6 BLU REG (STAPLE) IMPLANT
RELOAD STAPLE 60 4.1 GRN THCK (STAPLE) IMPLANT
RELOAD STAPLER BLUE 60MM (STAPLE) ×2 IMPLANT
RELOAD STAPLER GREEN 60MM (STAPLE) IMPLANT
RETRACTOR WND ALEXIS 18 MED (MISCELLANEOUS) IMPLANT
RTRCTR WOUND ALEXIS 18CM MED (MISCELLANEOUS)
SCISSORS LAP 5X35 DISP (ENDOMECHANICALS) ×3 IMPLANT
SEALER TISSUE G2 STRG ARTC 35C (ENDOMECHANICALS) ×2 IMPLANT
SET TUBE SMOKE EVAC HIGH FLOW (TUBING) ×3 IMPLANT
SHEARS HARMONIC ACE PLUS 36CM (ENDOMECHANICALS) IMPLANT
SLEEVE XCEL OPT CAN 5 100 (ENDOMECHANICALS) ×3 IMPLANT
SPONGE GAUZE 2X2 STER 10/PKG (GAUZE/BANDAGES/DRESSINGS) ×1
SPONGE T-LAP 18X18 ~~LOC~~+RFID (SPONGE) ×2 IMPLANT
STAPLE ECHEON FLEX 60 POW ENDO (STAPLE) ×2 IMPLANT
STAPLER RELOAD BLUE 60MM (STAPLE) ×3
STAPLER RELOAD GREEN 60MM (STAPLE)
STAPLER VISISTAT 35W (STAPLE) ×1 IMPLANT
SUT MNCRL AB 4-0 PS2 18 (SUTURE) ×3 IMPLANT
SUT PDS AB 0 CT1 36 (SUTURE) IMPLANT
SUT PDS AB 1 CT1 27 (SUTURE) IMPLANT
SUT PDS AB 1 CTX 36 (SUTURE) ×2 IMPLANT
SUT PDS AB 1 TP1 96 (SUTURE) IMPLANT
SUT PROLENE 0 CT 2 (SUTURE) ×1 IMPLANT
SUT PROLENE 2 0 SH DA (SUTURE) IMPLANT
SUT SILK 2 0 (SUTURE) ×3
SUT SILK 2 0 SH (SUTURE) IMPLANT
SUT SILK 2 0 SH CR/8 (SUTURE) ×1 IMPLANT
SUT SILK 2-0 18XBRD TIE 12 (SUTURE) ×2 IMPLANT
SUT SILK 3 0 (SUTURE) ×3
SUT SILK 3 0 SH CR/8 (SUTURE) ×1 IMPLANT
SUT SILK 3-0 18XBRD TIE 12 (SUTURE) ×2 IMPLANT
SUT VICRYL 0 UR6 27IN ABS (SUTURE) IMPLANT
TAPE UMBILICAL 1/8 X36 TWILL (MISCELLANEOUS) ×1 IMPLANT
TOWEL OR 17X26 10 PK STRL BLUE (TOWEL DISPOSABLE) ×3 IMPLANT
TOWEL OR NON WOVEN STRL DISP B (DISPOSABLE) ×2 IMPLANT
TRAY FOLEY MTR SLVR 14FR STAT (SET/KITS/TRAYS/PACK) ×3 IMPLANT
TRAY FOLEY MTR SLVR 16FR STAT (SET/KITS/TRAYS/PACK) IMPLANT
TRAY LAPAROSCOPIC (CUSTOM PROCEDURE TRAY) ×3 IMPLANT
TROCAR BLADELESS OPT 5 100 (ENDOMECHANICALS) ×3 IMPLANT
TROCAR XCEL 12X100 BLDLESS (ENDOMECHANICALS) ×3 IMPLANT

## 2021-08-26 NOTE — Anesthesia Preprocedure Evaluation (Addendum)
Anesthesia Evaluation  Patient identified by MRN, date of birth, ID band Patient awake    Reviewed: Allergy & Precautions, NPO status , Patient's Chart, lab work & pertinent test results  Airway Mallampati: II  TM Distance: >3 FB Neck ROM: Full    Dental  (+) Teeth Intact, Dental Advisory Given   Pulmonary asthma , former smoker,    breath sounds clear to auscultation       Cardiovascular hypertension,  Rhythm:Regular Rate:Normal     Neuro/Psych PSYCHIATRIC DISORDERS Depression    GI/Hepatic Neg liver ROS, GERD  ,  Endo/Other  Hypothyroidism   Renal/GU CRFRenal disease     Musculoskeletal  (+) Arthritis ,   Abdominal Normal abdominal exam  (+)   Peds  Hematology   Anesthesia Other Findings   Reproductive/Obstetrics                            Anesthesia Physical Anesthesia Plan  ASA: 2  Anesthesia Plan: General   Post-op Pain Management:    Induction: Intravenous  PONV Risk Score and Plan: 4 or greater and Ondansetron and Treatment may vary due to age or medical condition  Airway Management Planned: Oral ETT  Additional Equipment: None  Intra-op Plan:   Post-operative Plan: Extubation in OR  Informed Consent: I have reviewed the patients History and Physical, chart, labs and discussed the procedure including the risks, benefits and alternatives for the proposed anesthesia with the patient or authorized representative who has indicated his/her understanding and acceptance.     Dental advisory given  Plan Discussed with: CRNA  Anesthesia Plan Comments:        Anesthesia Quick Evaluation

## 2021-08-26 NOTE — Anesthesia Postprocedure Evaluation (Signed)
Anesthesia Post Note  Patient: YATZIRI WAINWRIGHT  Procedure(s) Performed: APPENDECTOMY LAPAROSCOPIC WITH LYSIS OF ADHESIONS (Abdomen)     Patient location during evaluation: PACU Anesthesia Type: General Level of consciousness: awake and alert Pain management: pain level controlled Vital Signs Assessment: post-procedure vital signs reviewed and stable Respiratory status: spontaneous breathing, nonlabored ventilation, respiratory function stable and patient connected to nasal cannula oxygen Cardiovascular status: blood pressure returned to baseline and stable Postop Assessment: no apparent nausea or vomiting Anesthetic complications: no   No notable events documented.  Last Vitals:  Vitals:   08/26/21 1045 08/26/21 1100  BP: (!) 162/89 (!) 150/78  Pulse: 77 73  Resp: 18 20  Temp:  36.4 C  SpO2: 98% 90%    Last Pain:  Vitals:   08/26/21 1100  TempSrc:   PainSc: 0-No pain                 Effie Berkshire

## 2021-08-26 NOTE — Anesthesia Procedure Notes (Signed)
Procedure Name: Intubation Date/Time: 08/26/2021 8:42 AM Performed by: Eben Burow, CRNA Pre-anesthesia Checklist: Patient identified, Emergency Drugs available, Suction available, Patient being monitored and Timeout performed Patient Re-evaluated:Patient Re-evaluated prior to induction Oxygen Delivery Method: Circle system utilized Preoxygenation: Pre-oxygenation with 100% oxygen Induction Type: IV induction Ventilation: Mask ventilation without difficulty Laryngoscope Size: Mac and 4 Grade View: Grade I Tube type: Oral Tube size: 7.0 mm Number of attempts: 1 Airway Equipment and Method: Stylet Placement Confirmation: ETT inserted through vocal cords under direct vision, positive ETCO2 and breath sounds checked- equal and bilateral Secured at: 22 cm Tube secured with: Tape Dental Injury: Teeth and Oropharynx as per pre-operative assessment

## 2021-08-26 NOTE — Interval H&P Note (Signed)
History and Physical Interval Note:  08/26/2021 7:58 AM  Cassidy Garcia  has presented today for surgery, with the diagnosis of [place].  The various methods of treatment have been discussed with the patient and family. After consideration of risks, benefits and other options for treatment, the patient has consented to  Procedure(s): APPENDECTOMY LAPAROSCOPIC (N/A) POSSIBLE PROXIMAL RIGHT COLECTOMY (Right) as a surgical intervention.  The patient's history has been reviewed, patient examined, no change in status, stable for surgery.  I have reviewed the patient's chart and labs.  Questions were answered to the patient's satisfaction.    I have re-reviewed the the patient's records, history, medications, and allergies.  I have re-examined the patient.  I again discussed intraoperative plans and goals of post-operative recovery.  The patient agrees to proceed.  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   Great toe pain, left 12/04/2016   Chronic right shoulder pain 12/04/2016   Degenerative arthritis of hip 12/10/2011   Chest pain 07/06/2011   HTN (hypertension) 07/06/2011   Hyperlipidemia 07/06/2011    Past Medical History:  Diagnosis Date   Arthritis    Asthma    Bronchitis 03/2016   Chronic kidney disease    stage 3   Colon polyps    tubular adenoma   Constipation    Depression    Diverticulosis    GERD (gastroesophageal reflux disease)    Hemorrhoids    Hyperlipidemia    Hypertension    Hypothyroidism    Shortness of  breath    with exertion on occasion   Sinusitis    Skin cancer     Past Surgical History:  Procedure Laterality Date   ABDOMINAL HYSTERECTOMY     CARPOMETACARPAL ([place]) FUSION OF THUMB Bilateral    CATARACT EXTRACTION, BILATERAL     CHOLECYSTECTOMY     COLONOSCOPY  02/12/2011   HAMMER TOE SURGERY Right    NASAL SINUS SURGERY     POLYPECTOMY     TOTAL HIP ARTHROPLASTY  12/10/2011   Procedure: TOTAL HIP ARTHROPLASTY ANTERIOR APPROACH;  Surgeon: Mcarthur Rossetti, MD;  Location: [facility];  Service: Orthopedics;  Laterality: Right;  Right Total Hip Arthroplasty, Anterior Approach    Social History   Socioeconomic History   Marital status: Widowed    Spouse name: Not on file   Number of children: 0   Years of education: Not on file   Highest education level: Not on file  Occupational History   Occupation: retired    Fish farm manager: RETIRED  Tobacco Use   Smoking status: Former    Types: Cigarettes    Quit date: 08/16/2001    Years since quitting: 20.0   Smokeless tobacco: Never  Vaping Use   Vaping Use: Never used  Substance and Sexual Activity   Alcohol use: Not Currently    Comment: rare   Drug use: No   Sexual activity: Not on file  Other Topics Concern   Not on file  Social History Narrative   Lives: alone   Employment:  retired.   Social Determinants of Health   Financial Resource Strain: Not on file  Food Insecurity: Not on file  Transportation Needs: Not on file  Physical Activity: Not on file  Stress: Not on file  Social Connections: Not on file  Intimate Partner Violence: Not on file    Family History  Problem Relation Age of Onset   Ovarian cancer Mother    Anxiety disorder Mother    Heart disease Paternal Grandmother    Other Paternal 61        brain tumor   Birth defects Sister        no iris   Depression Sister    Anxiety disorder Sister    Colon cancer Neg Hx     Medications Prior to Admission  Medication Sig Dispense Refill Last Dose    amitriptyline (ELAVIL) 25 MG tablet Take 75 mg by mouth at bedtime.    08/25/2021   aspirin 81 MG tablet Take 81 mg by mouth daily with breakfast.   Past Week   atorvastatin (LIPITOR) 40 MG tablet Take 40 mg by mouth daily.   08/25/2021   cholecalciferol (VITAMIN D3) 25 MCG (1000 UNIT) tablet Take 1,000 Units by mouth daily.   Past Week   diclofenac (VOLTAREN) 75 MG EC tablet Take 75 mg by mouth daily.   Past Week   famotidine (PEPCID) 20 MG tablet Take 20 mg by mouth daily as needed for heartburn or indigestion.   08/25/2021   levothyroxine (SYNTHROID) 25 MCG tablet Take 1 tablet (25 mcg total) by mouth daily before breakfast. 30 tablet 0 08/25/2021   lidocaine (LIDODERM) 5 % Place 1 patch onto the skin 3 (three) times a week. Remove & Discard patch within 12 hours or as directed by MD      losartan-hydrochlorothiazide (HYZAAR) 100-25 MG tablet Take 1 tablet by mouth daily.   08/25/2021   MAGNESIUM PO Take 1 tablet by mouth 2 (two) times daily. Gummie   Past Week   Multiple Vitamin (MULTIVITAMIN) tablet Take 1 tablet by mouth daily.   Past Week   omeprazole (PRILOSEC) 20 MG capsule Take 20 mg by mouth daily with breakfast.   08/26/2021 at 0500   oxymetazoline (AFRIN) 0.05 % nasal spray Place 1 spray into both nostrils daily as needed for congestion.   08/25/2021   Potassium Gluconate 2.5 MEQ TABS Take 650 mg by mouth daily.   08/25/2021   verapamil (CALAN-SR) 240 MG CR tablet Take 240 mg by mouth at bedtime.   08/25/2021   acetaminophen-codeine (TYLENOL #3) 300-30 MG tablet Take 1 tablet by mouth every 4 (four) hours as needed for moderate pain. 20 tablet 0 More than a month   albuterol (VENTOLIN HFA) 108 (90 Base) MCG/ACT inhaler Inhale 2 puffs into the lungs every 6 (six) hours as needed for wheezing or shortness of breath.    More than a month   HYDROcodone-acetaminophen (NORCO/VICODIN) 5-325 MG tablet Take 1 tablet by mouth daily as needed.      ondansetron (ZOFRAN-ODT) 4 MG disintegrating tablet Take  1 tablet (4 mg total) by mouth every 8 (eight) hours as needed for nausea or vomiting. 20 tablet 0 More than a month   VOLTAREN 1 % GEL Apply 2 g topically daily as needed (pain).   More than a month    Current Facility-Administered Medications  Medication Dose Route Frequency Provider Last Rate Last Admin   bupivacaine liposome (EXPAREL) 1.3 % injection 266 mg  20 mL Infiltration  Once Michael Boston, MD       cefoTEtan (CEFOTAN) 2 g in sodium chloride 0.9 % 100 mL IVPB  2 g Intravenous On Call to OR Michael Boston, MD       lactated ringers infusion   Intravenous Continuous Audry Pili, MD 10 mL/hr at 08/26/21 0650 New Bag at 08/26/21 0650     Allergies  Allergen Reactions   Codeine Nausea And Vomiting    REACTION: Lightheaded, nauses Was a high dose   Demerol [Meperidine Hcl] Other (See Comments)    Dizziness    Phenergan [Promethazine] Other (See Comments)    Feeling like heart attack    BP 126/80    Pulse 81    Temp 98.1 F (36.7 C) (Oral)    Resp 18    Ht 5\' 5"  (1.651 m)    Wt 91.4 kg    SpO2 98%    BMI 33.53 kg/m   Labs: No results found for this or any previous visit (from the past 48 hour(s)).  Imaging / Studies: No results found.   Adin Hector, M.D., F.A.C.S. Gastrointestinal and Minimally Invasive Surgery [facility] [address] [place], [place] [address] [phone] Main / Paging  08/26/2021 7:58 AM    Adin Hector

## 2021-08-26 NOTE — Transfer of Care (Signed)
Immediate Anesthesia Transfer of Care Note  Patient: Cassidy Garcia  Procedure(s) Performed: APPENDECTOMY LAPAROSCOPIC WITH LYSIS OF ADHESIONS (Abdomen)  Patient Location: PACU  Anesthesia Type:General  Level of Consciousness: drowsy and patient cooperative  Airway & Oxygen Therapy: Patient Spontanous Breathing and Patient connected to face mask oxygen  Post-op Assessment: Report given to RN and Post -op Vital signs reviewed and stable  Post vital signs: Reviewed and stable  Last Vitals:  Vitals Value Taken Time  BP 161/71 08/26/21 1002  Temp    Pulse 88 08/26/21 1004  Resp 15 08/26/21 1004  SpO2 99 % 08/26/21 1004  Vitals shown include unvalidated device data.  Last Pain:  Vitals:   08/26/21 0700  TempSrc: Oral         Complications: No notable events documented.

## 2021-08-26 NOTE — Op Note (Addendum)
PATIENT:  Cassidy Garcia  76 y.o. female  Patient Care Team: Burnard Bunting, MD as PCP - General (Internal Medicine) Michael Boston, MD as Consulting Physician (General Surgery) Irene Shipper, MD as Consulting Physician (Gastroenterology)  PRE-OPERATIVE DIAGNOSIS:  MUCOCELE OF THE APPENDIX  POST-OPERATIVE DIAGNOSIS:   MUCOCELE OF THE APPENDIX  PROCEDURE:   LAPAROSCOPIC APPENDECTOMY  WITH PARTIAL CECECTOMY LAPAROSCOPIC LYSIS OF ADHESIONS  SURGEON:  Adin Hector, MD  ASSISTANT:  Dr Nadeen Landau  ANESTHESIA:   local and general  EBL:  Total I/O In: 638 [I.V.:713; IV Piggyback:100] Out: 70 [Urine:60; Blood:10]  Delay start of Pharmacological VTE agent (>24hrs) due to surgical blood loss or risk of bleeding:  no  DRAINS: none   SPECIMEN:   APPENDIX  DISPOSITION OF SPECIMEN:  PATHOLOGY  COUNTS:  YES  PLAN OF CARE: Discharge to home after PACU  PATIENT DISPOSITION:  PACU - hemodynamically stable.   INDICATIONS: Patient found to have enlarged appendix on CT scan.  Prior colonoscopy 4 years ago underwhelming.  Possible mucocele.  I recommended diagnostic laparoscopy with extended appendectomy and possible ileocolectomy.    The anatomy & physiology of the digestive tract was discussed.  The pathophysiology of the colon was discussed.  Natural history risks without surgery was discussed.   I feel the risks of no intervention will lead to serious problems that outweigh the operative risks; therefore, I recommended a partial colectomy to remove the pathology.  Minimally invasive (Robotic/Laparoscopic) & open techniques were discussed.   Risks such as bleeding, infection, abscess, leak, reoperation, injury to other organs, need for repair of tissues / organs, possible ostomy, hernia, heart attack, stroke, death, and other risks were discussed.  I noted a good likelihood this will help address the problem.   Goals of post-operative recovery were discussed as well.   Need for  adequate nutrition, daily bowel regimen and healthy physical activity, to optimize recovery was noted as well. We will work to minimize complications.  Educational materials were available as well.  Questions were answered.  The patient expresses understanding & wishes to proceed with surgery.  OR FINDINGS: Dilatation of the mid to distal appendix consistent with mucocele.  Proximal third of the appendix underwhelming.  No evidence of any mucinous deposits or perforation.  Moderate interloop adhesions of ileum down into the pelvis with some hairpin turns.  Lysis of adhesions done and small bowel and interloop adhesions freed up.  Question of possible partial obstruction in this region.  No perforation.  CASE DATA:  Type of patient?: Elective [facility] Private Case  Status of Case? Elective Scheduled  Infection Present At Time Of Surgery (PATOS)?  NO  DESCRIPTION:   Informed consent was confirmed.  The patient underwent general anaesthesia without difficulty.  The patient was positioned appropriately.  VTE prevention in place.  The patient's abdomen was clipped, prepped, & draped in a sterile fashion.  Surgical timeout confirmed our plan.  Peritoneal entry with a laparoscopic port was obtained using Va[place] spring needle entry technique in the left upper abdomen as the patient was positioned in reverse Trendelenburg.  I induced carbon dioxide insufflation.  No change in end tidal CO2 measurements.  Full symmetrical abdominal distention.  Initial port was carefully placed using optical entry technique.  Camera inspection revealed no injury.  Extra ports were carefully placed under direct laparoscopic visualization.  Upon entering the abdomen (organ space), I encountered no evidence of peritonitis.  Patient had some moderate small bowel adhesions to the anterior  and deep pelvis.  I used laparoscopic lysed lesions using cold scissors to eventually free all the small bowel out of the pelvis and straighten it  out.  Without I could follow and find the ileocecal region.  I mobilized the terminal ileum to proximal ascending colon in a lateral to medial fashion.  I took care to avoid injuring any retroperitoneal structures.  I cannot identify an appendix with some mid dilatation consistent with the suspected mucocele.  There is no evidence of any perforation.  I see no evidence of any nodularity nor mucin.  I decided to proceed with appendectomy since the base of the appendix was normal caliber and not abnormal.   I freed the appendix off its attachments to the ascending colon and cecal mesentery and transected the mesoappendix.  I held on the mesoappendix to avoid grabbing the appendix itself to minimize risk of perforation.  I stapled the appendix off the cecum using a laparoscopic stapler. I took a healthy 3-4  cuff of viable cecum.  I placed the appendix inside an EcoSac bag and removed out the 50mm stapler port.  I did inspection.  I freed off a few remaining interloop adhesions of ileum.  I ran from the mid colon to the mid small bowel to confirm all adhesions have been freed off with no evidence of any serosal injury or other abnormality.  Hemostasis was good   Dr Dema Severin agreed.  I closed the 12 mm stapler port site with a #1 PDS stitch using a suture passer under direct laparoscopic visualization.   I aspirated the carbon dioxide. I removed the ports.  I closed skin using 4-0 monocryl stitch.  Sterile dressings applied.  Patient was extubated and sent to the recovery room.  I discussed the operative findings with the patient's friend, Darlis Loan.   The case in case went well requiring appendectomy/partial cecectomy only, I find it is reasonable for this to be an outpatient surgery and she can go home later today if she meets goals.  Patient's friend due to stay with her at least tonight.  Questions answered.  She expressed understanding appreciation.   Adin Hector, M.D., F.A.C.S. Gastrointestinal and  Minimally Invasive Surgery [facility] [address] [place], [place] [address] [phone] Main / Paging  08/26/2021 10:20 AM

## 2021-08-26 NOTE — Discharge Instructions (Signed)
SURGERY: POST OP INSTRUCTIONS (Surgery for small bowel obstruction, colon resection, etc)   ######################################################################  EAT Gradually transition to a high fiber diet with a fiber supplement over the next few days after discharge  WALK Walk an hour a day.  Control your pain to do that.    CONTROL PAIN Control pain so that you can walk, sleep, tolerate sneezing/coughing, go up/down stairs.  HAVE A BOWEL MOVEMENT DAILY Keep your bowels regular to avoid problems.  OK to try a laxative to override constipation.  OK to use an antidairrheal to slow down diarrhea.  Call if not better after 2 tries  CALL IF YOU HAVE PROBLEMS/CONCERNS Call if you are still struggling despite following these instructions. Call if you have concerns not answered by these instructions  ######################################################################   DIET Follow a light diet the first few days at home.  Start with a bland diet such as soups, liquids, starchy foods, low fat foods, etc.  If you feel full, bloated, or constipated, stay on a ful liquid or pureed/blenderized diet for a few days until you feel better and no longer constipated. Be sure to drink plenty of fluids every day to avoid getting dehydrated (feeling dizzy, not urinating, etc.). Gradually add a fiber supplement to your diet over the next week.  Gradually get back to a regular solid diet.  Avoid fast food or heavy meals the first week as you are more likely to get nauseated. It is expected for your digestive tract to need a few months to get back to normal.  It is common for your bowel movements and stools to be irregular.  You will have occasional bloating and cramping that should eventually fade away.  Until you are eating solid food normally, off all pain medications, and back to regular activities; your bowels will not be normal. Focus on eating a low-fat, high fiber diet the rest of your life  (See Getting to [place], below).  CARE of your INCISION or WOUND It is good for closed incision and even open wounds to be washed every day.  Shower every day.  Short baths are fine.  Wash the incisions and wounds clean with soap & water.     If you have a closed incision(s), wash the incision with soap & water every day.  You may leave closed incisions open to air if it is dry.   You may cover the incision with clean gauze & replace it after your daily shower for comfort.  It is good for closed incisions and even open wounds to be washed every day.  Shower every day.  Short baths are fine.  Wash the incisions and wounds clean with soap & water.    You may leave closed incisions open to air if it is dry.   You may cover the incision with clean gauze & replace it after your daily shower for comfort.  TEGADERM:  You have clear gauze band-aid dressings over your closed incision(s).  Remove the dressings 3 days after surgery.= 14 January = Saturday   If you have an open wound with a wound vac, see wound vac care instructions.     ACTIVITIES as tolerated Start light daily activities --- self-care, walking, climbing stairs-- beginning the day after surgery.  Gradually increase activities as tolerated.  Control your pain to be active.  Stop when you are tired.  Ideally, walk several times a day, eventually an hour a day.   Most people are back to  most day-to-day activities in a few weeks.  It takes 4-8 weeks to get back to unrestricted, intense activity. If you can walk 30 minutes without difficulty, it is safe to try more intense activity such as jogging, treadmill, bicycling, low-impact aerobics, swimming, etc. Save the most intensive and strenuous activity for last (Usually 4-8 weeks after surgery) such as sit-ups, heavy lifting, contact sports, etc.  Refrain from any intense heavy lifting or straining until you are off narcotics for pain control.  You will have off days, but things  should improve week-by-week. DO NOT PUSH THROUGH PAIN.  Let pain be your guide: If it hurts to do something, don't do it.  Pain is your body warning you to avoid that activity for another week until the pain goes down. You may drive when you are no longer taking narcotic prescription pain medication, you can comfortably wear a seatbelt, and you can safely make sudden turns/stops to protect yourself without hesitating due to pain. You may have sexual intercourse when it is comfortable. If it hurts to do something, stop.  MEDICATIONS Take your usually prescribed home medications unless otherwise directed.   Blood thinners:  Usually you can restart any strong blood thinners after the second postoperative day.  It is OK to take aspirin right away.     If you are on strong blood thinners (warfarin/Coumadin, Plavix, Xerelto, Eliquis, Pradaxa, etc), discuss with your surgeon, medicine PCP, and/or cardiologist for instructions on when to restart the blood thinner & if blood monitoring is needed (PT/INR blood check, etc).     PAIN CONTROL Pain after surgery or related to activity is often due to strain/injury to muscle, tendon, nerves and/or incisions.  This pain is usually short-term and will improve in a few months.  To help speed the process of healing and to get back to regular activity more quickly, DO THE FOLLOWING THINGS TOGETHER: Increase activity gradually.  DO NOT PUSH THROUGH PAIN Use Ice and/or Heat Try Gentle Massage and/or Stretching Take over the counter pain medication Take Narcotic prescription pain medication for more severe pain  Good pain control = faster recovery.  It is better to take more medicine to be more active than to stay in bed all day to avoid medications.  Increase activity gradually Avoid heavy lifting at first, then increase to lifting as tolerated over the next 6 weeks. Do not push through the pain.  Listen to your body and avoid positions and maneuvers than  reproduce the pain.  Wait a few days before trying something more intense Walking an hour a day is encouraged to help your body recover faster and more safely.  Start slowly and stop when getting sore.  If you can walk 30 minutes without stopping or pain, you can try more intense activity (running, jogging, aerobics, cycling, swimming, treadmill, sex, sports, weightlifting, etc.) Remember: If it hurts to do it, then dont do it! Use Ice and/or Heat You will have swelling and bruising around the incisions.  This will take several weeks to resolve. Ice packs or heating pads (6-8 times a day, 30-60 minutes at a time) will help sooth soreness & bruising. Some people prefer to use ice alone, heat alone, or alternate between ice & heat.  Experiment and see what works best for you.  Consider trying ice for the first few days to help decrease swelling and bruising; then, switch to heat to help relax sore spots and speed recovery. Shower every day.  Short baths are fine.  It feels good!  Keep the incisions and wounds clean with soap & water.   Try Gentle Massage and/or Stretching Massage at the area of pain many times a day Stop if you feel pain - do not overdo it Take over the counter pain medication This helps the muscle and nerve tissues become less irritable and calm down faster Choose ONE of the following over-the-counter anti-inflammatory medications: Acetaminophen 500mg  tabs (Tylenol) 1-2 pills with every meal and just before bedtime (avoid if you have liver problems or if you have acetaminophen in you narcotic prescription) Naproxen 220mg  tabs (ex. Aleve, Naprosyn) 1-2 pills twice a day (avoid if you have kidney, stomach, IBD, or bleeding problems) Ibuprofen 200mg  tabs (ex. Advil, Motrin) 3-4 pills with every meal and just before bedtime (avoid if you have kidney, stomach, IBD, or bleeding problems) Take with food/snack several times a day as directed for at least 2 weeks to help keep pain / soreness  down & more manageable. Take Narcotic prescription pain medication for more severe pain A prescription for strong pain control is often given to you upon discharge (for example: oxycodone/Percocet, hydrocodone/Norco/Vicodin, or tramadol/Ultram) Take your pain medication as prescribed. Be mindful that most narcotic prescriptions contain Tylenol (acetaminophen) as well - avoid taking too much Tylenol. If you are having problems/concerns with the prescription medicine (does not control pain, nausea, vomiting, rash, itching, etc.), please call us [phone] to see if we need to switch you to a different pain medicine that will work better for you and/or control your side effects better. If you need a refill on your pain medication, you must call the office before 4 pm and on weekdays only.  By federal law, prescriptions for narcotics cannot be called into a pharmacy.  They must be filled out on paper & picked up from our office by the patient or authorized caretaker.  Prescriptions cannot be filled after 4 pm nor on weekends.    WHEN TO CALL us [phone] Severe uncontrolled or worsening pain  Fever over 101 F (38.5 C) Concerns with the incision: Worsening pain, redness, rash/hives, swelling, bleeding, or drainage Reactions / problems with new medications (itching, rash, hives, nausea, etc.) Nausea and/or vomiting Difficulty urinating Difficulty breathing Worsening fatigue, dizziness, lightheadedness, blurred vision Other concerns If you are not getting better after two weeks or are noticing you are getting worse, contact our office [phone] for further advice.  We may need to adjust your medications, re-evaluate you in the office, send you to the emergency room, or see what other things we can do to help. The clinic staff is available to answer your questions during regular business hours (8:30am-5pm).  Please dont hesitate to call and ask to speak to one of our nurses for clinical  concerns.    A surgeon from [facility] is always on call at the hospitals 24 hours/day If you have a medical emergency, go to the nearest emergency room or call 911.  FOLLOW UP in our office One the day of your discharge from the hospital (or the next business weekday), please call [facility] to set up or confirm an appointment to see your surgeon in the office for a follow-up appointment.  Usually it is 2-3 weeks after your surgery.   If you have skin staples at your incision(s), let the office know so we can set up a time in the office for the nurse to remove them (usually around 10 days after surgery). Make sure  that you call for appointments the day of discharge (or the next business weekday) from the hospital to ensure a convenient appointment time. IF YOU HAVE DISABILITY OR FAMILY LEAVE FORMS, BRING THEM TO THE OFFICE FOR PROCESSING.  DO NOT GIVE THEM TO YOUR DOCTOR.  [facility] [address], [place], [place], [place]  [id number] ? [phone] - Main [phone] - [place],  [phone] - Fax [email or web address]  GETTING TO GOOD BOWEL HEALTH. It is expected for your digestive tract to need a few months to get back to normal.  It is common for your bowel movements and stools to be irregular.  You will have occasional bloating and cramping that should eventually fade away.  Until you are eating solid food normally, off all pain medications, and back to regular activities; your bowels will not be normal.   Avoiding constipation The goal: ONE SOFT BOWEL MOVEMENT A DAY!    Drink plenty of fluids.  Choose water first. TAKE A FIBER SUPPLEMENT EVERY DAY THE REST OF YOUR LIFE During your first week back home, gradually add back a fiber supplement every day Experiment which form you can tolerate.   There are many forms such as powders, tablets, wafers, gummies, etc Psyllium bran (Metamucil), methylcellulose (Citrucel),  Miralax or Glycolax, Benefiber, Flax Seed.  Adjust the dose week-by-week (1/2 dose/day to 6 doses a day) until you are moving your bowels 1-2 times a day.  Cut back the dose or try a different fiber product if it is giving you problems such as diarrhea or bloating. Sometimes a laxative is needed to help jump-start bowels if constipated until the fiber supplement can help regulate your bowels.  If you are tolerating eating & you are farting, it is okay to try a gentle laxative such as double dose MiraLax, prune juice, or Milk of Magnesia.  Avoid using laxatives too often. Stool softeners can sometimes help counteract the constipating effects of narcotic pain medicines.  It can also cause diarrhea, so avoid using for too long. If you are still constipated despite taking fiber daily, eating solids, and a few doses of laxatives, call our office. Controlling diarrhea Try drinking liquids and eating bland foods for a few days to avoid stressing your intestines further. Avoid dairy products (especially milk & ice cream) for a short time.  The intestines often can lose the ability to digest lactose when stressed. Avoid foods that cause gassiness or bloating.  Typical foods include beans and other legumes, cabbage, broccoli, and dairy foods.  Avoid greasy, spicy, fast foods.  Every person has some sensitivity to other foods, so listen to your body and avoid those foods that trigger problems for you. Probiotics (such as active yogurt, Align, etc) may help repopulate the intestines and colon with normal bacteria and calm down a sensitive digestive tract Adding a fiber supplement gradually can help thicken stools by absorbing excess fluid and retrain the intestines to act more normally.  Slowly increase the dose over a few weeks.  Too much fiber too soon can backfire and cause cramping & bloating. It is okay to try and slow down diarrhea with a few doses of antidiarrheal medicines.   Bismuth subsalicylate (ex.  Kayopectate, Pepto Bismol) for a few doses can help control diarrhea.  Avoid if pregnant.   Loperamide (Imodium) can slow down diarrhea.  Start with one tablet (2mg ) first.  Avoid if you are having fevers or severe pain.  ILEOSTOMY PATIENTS WILL HAVE CHRONIC DIARRHEA  since their colon is not in use.    Drink plenty of liquids.  You will need to drink even more glasses of water/liquid a day to avoid getting dehydrated. Record output from your ileostomy.  Expect to empty the bag every 3-4 hours at first.  Most people with a permanent ileostomy empty their bag 4-6 times at the least.   Use antidiarrheal medicine (especially Imodium) several times a day to avoid getting dehydrated.  Start with a dose at bedtime & breakfast.  Adjust up or down as needed.  Increase antidiarrheal medications as directed to avoid emptying the bag more than 8 times a day (every 3 hours). Work with your wound ostomy nurse to learn care for your ostomy.  See ostomy care instructions. TROUBLESHOOTING IRREGULAR BOWELS 1) Start with a soft & bland diet. No spicy, greasy, or fried foods.  2) Avoid gluten/wheat or dairy products from diet to see if symptoms improve. 3) Miralax 17gm or flax seed mixed in [place]. water or juice-daily. May use 2-4 times a day as needed. 4) Gas-X, Phazyme, etc. as needed for gas & bloating.  5) Prilosec (omeprazole) over-the-counter as needed 6)  Consider probiotics (Align, Activa, etc) to help calm the bowels down  Call your doctor if you are getting worse or not getting better.  Sometimes further testing (cultures, endoscopy, X-ray studies, CT scans, bloodwork, etc.) may be needed to help diagnose and treat the cause of the diarrhea. [facility], [place], [place], [place], [place]  [address] [phone] - Main.    [phone]  - Toll Free.   [phone] - Fax [email or web address]

## 2021-08-26 NOTE — H&P (Signed)
08/26/2021    REFERRING PHYSICIAN: Eustace Quail., MD  Patient Care Team: Geoffery Lyons, MD as PCP - General (Internal Medicine) Johney Maine, Adrian Saran, MD as Consulting Provider (General Surgery) Eustace Quail., MD (Gastroenterology)  PROVIDER: Hollace Kinnier, MD  [facility] MRN: [id number] DOB: [birth year] DATE OF ENCOUNTER: 07/13/2021  SUBJECTIVE   Chief Complaint: Mucocele of appendix   History of Present Illness: Cassidy Garcia is a 76 y.o. female  who is seen today as an office consultation  at the request of Dr. Henrene Pastor  for evaluation of Mucocele of appendix .   Pleasant woman. Has had colonoscopies in the past. Had 1 adenomatous polyp a while ago but relatively clear. Last colonoscopy 2018 underwhelming. She had some slightly abnormal liver function tests earlier this year. Had a CAT scan as part of the work-up. Appendix mildly dilated. Recommended follow-up CT scan. That happened last month. Appendix more dilated. Mucocele suspected. Dr. Henrene Pastor with gastrology recommended surgical consultation.  Patient today with her self. She usually lives by herself. She usually moves her bowels every day. She does have some issues with right-sided sciatica and knee pain but can walk somewhat well. No exertional chest pain or shortness of breath. She has not smoked since 1990. No diabetes. No sleep apnea. She had a hysterectomy in 1980. She had a laparoscopic cholecystectomy in the 1990s. No reported bouts of constipation or diarrhea. No family history of bowel problems that she is aware of.  Medical History:  Past Medical History:  Diagnosis Date   Arthritis   Asthma, unspecified asthma severity, unspecified whether complicated, unspecified whether persistent   Chronic kidney disease   GERD (gastroesophageal reflux disease)   Hypertension   Thyroid disease   Patient Active Problem List  Diagnosis   Mucocele of appendix   BMI 33.0-33.9,adult   History of colonic polyps    Past Surgical History:  Procedure Laterality Date   CHOLECYSTECTOMY   HYSTERECTOMY   JOINT REPLACEMENT    Allergies  Allergen Reactions   Codeine Nausea And Vomiting  REACTION: Lightheaded, nauses   Current Outpatient Medications on File Prior to Visit  Medication Sig Dispense Refill   amitriptyline (ELAVIL) 25 MG tablet TAKE 3 TABLETS AT BEDTIME 90   aspirin 81 MG EC tablet Take 1 tablet (81 mg total) by mouth once daily   atorvastatin (LIPITOR) 40 MG tablet Take 1 tablet (40 mg total) by mouth once daily   cholecalciferol (VITAMIN D3) 1000 unit tablet Take by mouth   diclofenac (VOLTAREN) 75 MG EC tablet Take 1 tablet (75 mg total) by mouth 2 (two) times daily as needed   levothyroxine (SYNTHROID) 25 MCG tablet TAKE 1 TABLET IN THE MORNING ON AN EMPTY STOMACH   losartan-hydrochlorothiazide (HYZAAR) 100-25 mg tablet   magnesium citrate 100 mg Tab Take by mouth 2 (two) times daily   multivitamin tablet Take 1 tablet by mouth once daily   omeprazole (PRILOSEC) 20 MG DR capsule Take by mouth   potassium gluconate 600 mg (99 mg) Tab Take by mouth   verapamiL (VERELAN) 240 MG SR capsule   No current facility-administered medications on file prior to visit.   Family History  Problem Relation Age of Onset   Ovarian cancer Mother   Obesity Sister    Social History   Tobacco Use  Smoking Status Former   Types: Cigarettes   Quit date: 1990   Years since quitting: 32.9  Smokeless Tobacco Never    Social History  Socioeconomic History   Marital status: Widowed  Tobacco Use   Smoking status: Former  Types: Cigarettes  Quit date: 1990  Years since quitting: 32.9   Smokeless tobacco: Never  Substance and Sexual Activity   Alcohol use: Never   Drug use: Never   ############################################################  Review of Systems: A complete review of systems (ROS) was obtained from the patient. I have reviewed this information and discussed as  appropriate with the patient. See HPI as well for other pertinent ROS.  Constitutional: No fevers, chills, sweats. Weight stable Eyes: No vision changes, No discharge HENT: No sore throats, nasal drainage Lymph: No neck swelling, No bruising easily Pulmonary: No cough, productive sputum CV: No orthopnea, PND Patient walks 30 minutes for about 1 miles without difficulty. No exertional chest/neck/shoulder/arm pain.  GI: No personal nor family history of GI/colon cancer, inflammatory bowel disease, irritable bowel syndrome, allergy such as Celiac Sprue, dietary/dairy problems, colitis, ulcers nor gastritis. No recent sick contacts/gastroenteritis. No travel outside the country. No changes in diet.  Renal: No UTIs, No hematuria Genital: No drainage, bleeding, masses Musculoskeletal: No severe joint pain. Good ROM major joints Skin: No sores or lesions Heme/Lymph: No easy bleeding. No swollen lymph nodes  OBJECTIVE   Vitals:  07/13/21 1531  BP: 132/82  Pulse: 88  Weight: 92.1 kg (203 lb)  Height: 165.1 cm (5\' 5" )    08/26/2021 BP 126/80    Pulse 81    Temp 98.1 F (36.7 C) (Oral)    Resp 18    Ht 5\' 5"  (1.651 m)    Wt 91.4 kg    SpO2 98%    BMI 33.53 kg/m    Body mass index is 33.78 kg/m.  PHYSICAL EXAM:  Constitutional: Not cachectic. Hygeine adequate. Vitals signs as above.  Eyes: Pupils reactive, normal extraocular movements. Sclera nonicteric Neuro: CN II-XII intact. No major focal sensory defects. No major motor deficits. Lymph: No head/neck/groin lymphadenopathy Psych: No severe agitation. No severe anxiety. Judgment & insight Adequate, Oriented x4, HENT: Normocephalic, Mucus membranes moist. No thrush.  Neck: Supple, No tracheal deviation. No obvious thyromegaly Chest: No pain to chest wall compression. Good respiratory excursion. No audible wheezing CV: Pulses intact. Regular rhythm. No major extremity edema Ext: No obvious deformity or contracture. Edema: Not  present. No cyanosis Skin: No major subcutaneous nodules. Warm and dry Musculoskeletal: Severe joint rigidity not present. No obvious clubbing. No digital petechiae.   Abdomen:  Obese Hernia: Not present. Diastasis recti: Mild supraumbilical midline.  Soft. Nondistended. Nontender. No hepatomegaly. No splenomegaly  Genital/Pelvic: Inguinal hernia: Not present. Inguinal lymph nodes: without lymphadenopathy.   Rectal: (Deferred)    ###################################################################  Labs, Imaging and Diagnostic Testing:  Located in 'Care Everywhere' section of Epic EMR chart  PRIOR [facility] CLINIC NOTES:  Not applicable  SURGERY NOTES:  Not applicable  PATHOLOGY:  Not applicable  Assessment and Plan:  DIAGNOSES:  Diagnoses and all orders for this visit:  Mucocele of appendix  BMI 33.0-33.9,adult  History of colonic polyps  Other orders - polyethylene glycol (MIRALAX) powder; Take 233.75 g by mouth once for 1 dose Take according to your procedure prep instructions. - bisacodyL (DULCOLAX) 5 mg EC tablet; Take 4 tablets (20 mg total) by mouth once daily as needed for Constipation for up to 1 dose - metroNIDAZOLE (FLAGYL) 500 MG tablet; Take 2 tablets (1,000 mg total) by mouth 3 (three) times daily for 3 doses SEE BOWEL PREP INSTRUCTIONS: Take 2 tablets at 2pm, 3pm, and 10pm  the day prior to your colon operation. - neomycin 500 mg tablet; Take 2 tablets (1,000 mg total) by mouth 3 (three) times daily for 3 doses SEE BOWEL PREP INSTRUCTIONS: Take 2 tablets at 2pm, 3pm, and 10pm the day prior to your colon operation.    ASSESSMENT/PLAN  Pleasant woman with Incidental enlarging appendix with hypoattenuated content most likely consistent with mucocele. It is definitely larger compared to earlier this year. No evidence of carcinomatosis or mucin. No mass or tumor.history of 1-2 colon polyps in her lifetime. Last colonoscopy 2018 underwhelming.   I recommended  laparoscopic extended appendectomy/partial cecectomy, possible need for ileocecectomy but hopefully not too severe. No major bulging into the cecum. We will see. Even then if I have to convert to a more significant resection, hopefully a good chance of a minimally invasive approach with immediate anastomosis. Intracorporeal. Perhaps outpatient surgery versus overnight stay if just appendix. Longer if she has a cecectomy.   The anatomy & physiology of the digestive tract was discussed. The pathophysiology of the colon was discussed. Natural history risks without surgery was discussed. I feel the risks of no intervention will lead to serious problems that outweigh the operative risks; therefore, I recommended an extended appendectomy/partial cecetomy versus ileocolectomy to remove the pathology. Minimally invasive (Robotic/Laparoscopic) & open techniques were discussed.   Risks such as bleeding, infection, abscess, leak, reoperation, injury to other organs, need for repair of tissues / organs, possible ostomy, hernia, heart attack, stroke, death, and other risks were discussed. I noted a good likelihood this will help address the problem. Goals of post-operative recovery were discussed as well. Need for adequate nutrition, daily bowel regimen and healthy physical activity, to optimize recovery was noted as well. We will work to minimize complications. Educational materials were available as well. Questions were answered. The patient expresses understanding & wishes to proceed with surgery.   I worked to address her concerns. Noted I can give no guarantees but hopefully are catching this early. We will try and get this done within the next month or so. She asked many appropriate questions. I believe I satisfied them. She has had rather underwhelming colonoscopies for the past few times, so I do not feel strongly we need to repeat one at this time. Neither did Dr. Laurice Record, MD, FACS,  M[facility] Esophageal, Gastrointestinal & Colorectal Surgery Robotic and Minimally Invasive Surgery  [facility], [place]  [place]. [address] [place], [place] [address] [phone] Fax [phone] Main  CONTACT INFORMATION:  Weekday (9AM-5PM): Call [facility] main office at [phone]  Weeknight (5PM-9AM) or Weekend/Holiday: Check [email or web address] (password " [email or web address]") for General Surgery [facility] coverage  (Please, do not use SecureChat as it is not reliable communication to operating surgeons for immediate patient care)    08/26/2021

## 2021-08-27 ENCOUNTER — Encounter (HOSPITAL_COMMUNITY): Payer: Self-pay | Admitting: Surgery

## 2021-08-27 LAB — SURGICAL PATHOLOGY

## 2021-08-31 ENCOUNTER — Other Ambulatory Visit (HOSPITAL_COMMUNITY): Payer: Self-pay

## 2021-09-15 DIAGNOSIS — K625 Hemorrhage of anus and rectum: Secondary | ICD-10-CM | POA: Insufficient documentation

## 2021-11-16 DIAGNOSIS — H04123 Dry eye syndrome of bilateral lacrimal glands: Secondary | ICD-10-CM | POA: Diagnosis not present

## 2021-11-16 DIAGNOSIS — H52203 Unspecified astigmatism, bilateral: Secondary | ICD-10-CM | POA: Diagnosis not present

## 2021-11-16 DIAGNOSIS — H26491 Other secondary cataract, right eye: Secondary | ICD-10-CM | POA: Diagnosis not present

## 2021-11-16 DIAGNOSIS — Z961 Presence of intraocular lens: Secondary | ICD-10-CM | POA: Diagnosis not present

## 2021-12-02 ENCOUNTER — Ambulatory Visit (INDEPENDENT_AMBULATORY_CARE_PROVIDER_SITE_OTHER): Payer: Medicare Other

## 2021-12-02 ENCOUNTER — Other Ambulatory Visit: Payer: Self-pay

## 2021-12-02 ENCOUNTER — Ambulatory Visit (INDEPENDENT_AMBULATORY_CARE_PROVIDER_SITE_OTHER): Payer: Medicare Other | Admitting: Orthopaedic Surgery

## 2021-12-02 DIAGNOSIS — G8929 Other chronic pain: Secondary | ICD-10-CM

## 2021-12-02 DIAGNOSIS — M5441 Lumbago with sciatica, right side: Secondary | ICD-10-CM

## 2021-12-02 DIAGNOSIS — R110 Nausea: Secondary | ICD-10-CM | POA: Insufficient documentation

## 2021-12-02 MED ORDER — METHYLPREDNISOLONE 4 MG PO TABS: ORAL_TABLET | ORAL | 0 refills | Status: DC

## 2021-12-02 MED ORDER — METHOCARBAMOL 500 MG PO TABS: 500.0000 mg | ORAL_TABLET | Freq: Every day | ORAL | 1 refills | Status: DC

## 2021-12-02 NOTE — Progress Notes (Signed)
? ?Office Visit Note ?  ?Patient: Cassidy Garcia           ?Date of Birth: 04-10-1946           ?MRN: [id number] ?Visit Date: 12/02/2021 ?             ?Requested by: Burnard Bunting, MD ?[address] ?[place],  [place] [address] ?PCP: Burnard Bunting, MD ? ? ?Assessment & Plan: ?Visit Diagnoses:  ?1. Chronic right-sided low back pain with right-sided sciatica   ? ? ?Plan: We will send her to formal physical therapy for core strengthening, stretching, back exercises, home exercise program and modalities.  She is placed on a Medrol Dosepak no NSAIDs while on the Medrol Dosepak.  Also gave her Robaxin to take at night.  She will follow-up with [place] in 4 weeks to see how she is doing overall.  Questions were encouraged and answered by Dr. Ninfa Linden and myself.  She may benefit from epidural steroid injection lumbar spine in the future and will require an MRI prior to the injection. ? ?Follow-Up Instructions: Return in about 4 weeks (around 12/30/2021).  ? ?Orders:  ?Orders Placed This Encounter  ?Procedures  ? XR Lumbar Spine 2-3 Views  ? ?Meds ordered this encounter  ?Medications  ? methylPREDNISolone (MEDROL) 4 MG tablet  ?  Sig: Take as directed  ?  Dispense:  21 tablet  ?  Refill:  0  ? methocarbamol (ROBAXIN) 500 MG tablet  ?  Sig: Take 1 tablet (500 mg total) by mouth at bedtime.  ?  Dispense:  40 tablet  ?  Refill:  1  ? ? ? ? Procedures: ?No procedures performed ? ? ?Clinical Data: ?No additional findings. ? ? ?Subjective: ?Chief Complaint  ?Patient presents with  ? Lower Back - Pain  ? ? ?HPI ?Mrs. Cassidy Garcia is well-known to Dr. Delilah Shan service comes in today for low back pain and buttocks pain.  States the pains been ongoing for some time getting worse.  Ranks her pain to be 8-9 out of 10 pain that radiates into the right buttocks region with prolonged standing and walking.  She also notes she is having some numbness tingling numbing her right foot.  Occasional odd feeling down the lateral aspect of the right thigh that  goes down to just below her right knee.  She denies any saddle anesthesia like symptoms, bowel or bladder dysfunction or lumbar waking pain.  She has had no injury to her back.  She has a history of right total hip arthroplasty which is doing well.  She has been using lidocaine patches she does take diclofenac orally and is unsure if the diclofenac helps with her back pain or not the lidocaine patches do seem to help some. ?Review of Systems ?See HPI otherwise negative ? ?Objective: ?Vital Signs: There were no vitals taken for this visit. ? ?Physical Exam ?Constitutional:   ?   Appearance: She is not ill-appearing or diaphoretic.  ?Pulmonary:  ?   Effort: Pulmonary effort is normal.  ?Neurological:  ?   Mental Status: She is alert and oriented to person, place, and time.  ?Psychiatric:     ?   Mood and Affect: Mood normal.  ? ? ?Ortho Exam ?Bilateral hips good range of motion slight tenderness over the trochanteric region left hip.  Lower extremities 5 out of 5 strength throughout against resistance.  Negative straight leg raise bilaterally.  Tight hamstrings bilaterally.  Full flexion of the lumbar spine is able  to touch the floor with her hands.  She has good extension without pain with lumbar spine.  Tenderness over the right lower lumbar sacral paraspinous region ?Specialty Comments:  ?No specialty comments available. ? ?Imaging: ?XR Lumbar Spine 2-3 Views ? ?Result Date: 12/02/2021 ?Lumbar spine: No acute fractures loss of lordotic curvature.  Slight scoliosis.  Severe degenerative changes throughout with loss of disc space at L1-L2 3 L3-4 and L4-5 S1.  Grade 1 anterior spinal listhesis at L3-4 and L4-5 arthrosclerosis aorta.Significant lower lumbar facet changes.   ? ? ?PMFS History: ?Patient Active Problem List  ? Diagnosis Date Noted  ? Gastroenteritis 09/09/2020  ? Depression 01/24/2020  ? Asthma 01/24/2020  ? CKD (chronic kidney disease), stage III ([place]) 01/24/2020  ? Acute on chronic renal failure ([place])  01/24/2020  ? Pain in right hip 06/29/2017  ? Trochanteric bursitis, right hip 06/29/2017  ? 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 Great toe pain, left 12/04/2016  ? Chronic right shoulder pain 12/04/2016  ? Degenerative arthritis of hip 12/10/2011  ? Chest pain 07/06/2011  ? HTN (hypertension) 07/06/2011  ? Hyperlipidemia 07/06/2011  ? ?Past Medical History:  ?Diagnosis Date  ? Arthritis   ? Asthma   ? Bronchitis 03/2016  ? Chronic kidney disease   ? stage 3  ? Colon polyps   ? tubular adenoma  ? Constipation   ? Depression   ? Diverticulosis   ? GERD (gastroesophageal reflux disease)   ? Hemorrhoids   ? Hyperlipidemia   ? Hypertension   ? Hypothyroidism   ? Shortness of breath   ? with exertion on occasion  ? Sinusitis   ? Skin cancer   ?  ?Family History  ?Problem Relation Age of Onset  ? Ovarian cancer Mother   ? Anxiety disorder Mother   ? Heart disease Paternal Grandmother   ? Other Paternal Aunt   ?     brain tumor  ? Birth defects Sister   ?     no iris  ? Depression Sister   ? Anxiety disorder Sister   ? Colon cancer Neg Hx   ?  ?Past Surgical History:  ?Procedure Laterality Date  ? ABDOMINAL HYSTERECTOMY    ? CARPOMETACARPAL ([facility]) FUSION OF THUMB Bilateral   ? CATARACT EXTRACTION, BILATERAL    ? CHOLECYSTECTOMY    ? COLONOSCOPY  02/12/2011  ? HAMMER TOE SURGERY Right   ? LAPAROSCOPIC APPENDECTOMY N/A 08/26/2021  ? Procedure: APPENDECTOMY LAPAROSCOPIC WITH LYSIS OF ADHESIONS;  Surgeon: Michael Boston, MD;  Location: [facility];  Service: General;  Laterality: N/A;  ? NASAL SINUS SURGERY    ? POLYPECTOMY    ? TOTAL HIP ARTHROPLASTY  12/10/2011  ? Procedure: TOTAL HIP ARTHROPLASTY ANTERIOR APPROACH;  Surgeon: Mcarthur Rossetti, MD;  Location: [facility];  Service: Orthopedics;  Laterality: Right;  Right Total Hip Arthroplasty, Anterior Approach  ? ?Social History  ? ?Occupational History  ? Occupation: retired  ?  Employer: RETIRED  ?Tobacco Use  ? Smoking status: Former  ?  Types: Cigarettes  ?   Quit date: 08/16/2001  ?  Years since quitting: 20.3  ? Smokeless tobacco: Never  ?Vaping Use  ? Vaping Use: Never used  ?Substance and Sexual Activity  ? Alcohol use: Not Currently  ?  Comment: rare  ? Drug use: No  ? Sexual activity: Not on file  ? ? ? ? ? ? ?

## 2021-12-03 ENCOUNTER — Ambulatory Visit (INDEPENDENT_AMBULATORY_CARE_PROVIDER_SITE_OTHER): Payer: Medicare Other | Admitting: Nurse Practitioner

## 2021-12-03 ENCOUNTER — Other Ambulatory Visit (INDEPENDENT_AMBULATORY_CARE_PROVIDER_SITE_OTHER): Payer: Medicare Other

## 2021-12-03 ENCOUNTER — Encounter: Payer: Self-pay | Admitting: Nurse Practitioner

## 2021-12-03 VITALS — BP 120/70 | HR 93 | Ht 65.0 in | Wt 205.0 lb

## 2021-12-03 DIAGNOSIS — K648 Other hemorrhoids: Secondary | ICD-10-CM | POA: Diagnosis not present

## 2021-12-03 DIAGNOSIS — N184 Chronic kidney disease, stage 4 (severe): Secondary | ICD-10-CM

## 2021-12-03 DIAGNOSIS — D649 Anemia, unspecified: Secondary | ICD-10-CM

## 2021-12-03 DIAGNOSIS — K625 Hemorrhage of anus and rectum: Secondary | ICD-10-CM | POA: Diagnosis not present

## 2021-12-03 LAB — COMPREHENSIVE METABOLIC PANEL
ALT: 47 U/L — ABNORMAL HIGH (ref 0–35)
AST: 28 U/L (ref 0–37)
Albumin: 4.4 g/dL (ref 3.5–5.2)
Alkaline Phosphatase: 108 U/L (ref 39–117)
BUN: 44 mg/dL — ABNORMAL HIGH (ref 6–23)
CO2: 26 mEq/L (ref 19–32)
Calcium: 9.1 mg/dL (ref 8.4–10.5)
Chloride: 107 mEq/L (ref 96–112)
Creatinine, Ser: 1.61 mg/dL — ABNORMAL HIGH (ref 0.40–1.20)
GFR: 30.92 mL/min — ABNORMAL LOW (ref >60.00)
Glucose, Bld: 130 mg/dL — ABNORMAL HIGH (ref 70–99)
Potassium: 4.6 mEq/L (ref 3.5–5.1)
Sodium: 142 mEq/L (ref 135–145)
Total Bilirubin: 0.7 mg/dL (ref 0.2–1.2)
Total Protein: 6.2 g/dL (ref 6.0–8.3)

## 2021-12-03 LAB — CBC
HCT: 37.4 % (ref 36.0–46.0)
Hemoglobin: 12.6 g/dL (ref 12.0–15.0)
MCHC: 33.8 g/dL (ref 30.0–36.0)
MCV: 94.7 fl (ref 78.0–100.0)
Platelets: 201 10*3/uL (ref 150.0–400.0)
RBC: 3.94 Mil/uL (ref 3.87–5.11)
RDW: 13.4 % (ref 11.5–15.5)
WBC: 7.3 10*3/uL (ref 4.0–10.5)

## 2021-12-03 LAB — IBC + FERRITIN
Ferritin: 242.7 ng/mL (ref 10.0–291.0)
Iron: 99 ug/dL (ref 42–145)
Saturation Ratios: 30.2 % (ref 20.0–50.0)
TIBC: 327.6 ug/dL (ref 250.0–450.0)
Transferrin: 234 mg/dL (ref 212.0–360.0)

## 2021-12-03 MED ORDER — HYDROCORTISONE (PERIANAL) 2.5 % EX CREA: 1.0000 "application " | TOPICAL_CREAM | Freq: Two times a day (BID) | CUTANEOUS | 1 refills | Status: AC

## 2021-12-03 NOTE — Patient Instructions (Addendum)
Purchase Preparation H suppositories over the counter.  Take a pea sized amount of the Anusol cream, apply to the suppository and insert rectally for 5 nights. ? ?Pick up the Anusol cream from the pharmacy. ? ?Continue Colace daily. ? ?Please proceed to the basement level for lab work before leaving today. Press "B" on the elevator. The lab is located at the first door on the left as you exit the elevator. ? ?HEALTHCARE LAWS AND MY CHART RESULTS:  ? ?Due to recent changes in healthcare laws, you may see results of your imaging and/or laboratory studies on MyChart before I have had a chance to review them.  I understand that in some cases there may be results that are confusing or concerning to you. Please understand that not all results are received at the same time and often I may need to interpret multiple results in order to provide you with the best plan of care or course of treatment. Therefore, I ask that you please give me 48 hours to thoroughly review all your results before contacting my office for clarification.  ? ?Thank you for trusting me with your gastrointestinal care!   ? ?[name], CRNP ? ? ? ?BMI: ? ?If you are age 4 or older, your body mass index should be between 23-30. Your Body mass index is 34.11 kg/m?Marland Kitchen If this is out of the aforementioned range listed, please consider follow up with your Primary Care Provider. ? ?If you are age 8 or younger, your body mass index should be between 19-25. Your Body mass index is 34.11 kg/m?Marland Kitchen If this is out of the aformentioned range listed, please consider follow up with your Primary Care Provider.  ? ?MY CHART: ? ?The [place] providers would like to encourage you to use [facility] to communicate with providers for non-urgent requests or questions.  Due to long hold times on the telephone, sending your provider a message by [facility] may be a faster and more efficient way to get a response.  Please allow 48 business hours for a response.  Please  remember that this is for non-urgent requests.  ? ?

## 2021-12-03 NOTE — Progress Notes (Signed)
? ? ? ?12/03/2021 ?Cassidy Garcia ?[id number] ?1946/06/02 ? ? ?Chief Complaint: Hemorrhoids ? ?History of Present Illness: Cassidy Garcia is a 76 year old female with a past medical history of depression, arthritis, asthma, hypertension, hyperlipidemia, CKD stage 3, chronic lower back pain with sciatica, GERD and colon polyps. Past cataract surgery, hysterectomy and cholecystectomy. 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 S/P laparoscopic appendectomy with partial cecectomy and lysis of adhesions 08/26/2021. ? ?She presents to our office today for further evaluation regarding hemorrhoids. She developed rectal bleeding 3 months ago. She passed a normal formed stool with a moderate amount of bright red blood which occurred x 1 episode daily x 2 days. On the third day, she developed anorectal pain with less blood with a BM. Since then, her rectal bleeding has significantly reduced and no further anorectal pain  for the past few days. She started taking Colace '250mg'$  once daily about one month ago.  She reported completing FOBT as ordered by her PCP 07/2021 which was positive but she noted having hemorrhoidal bleeding at that time.  She takes diclofenac daily for arthritis, she stopped taking it 2 days ago.  Her most recent colonoscopy by Dr. Henrene Pastor was 08/19/2016 which identified a 1 mm polyp which was removed from the transverse colon, no meaningful tissue available for pathologic submission.  No further colon polyp surveillance colonoscopies were recommended due to age.  No GERD symptoms. ?  ? ?  Latest Ref Rng & Units 08/12/2021  ? 11:24 AM 10/13/2020  ? 12:00 AM 08/07/2020  ?  3:54 PM  ?CBC  ?WBC 4.0 - 10.5 K/uL 6.1   8.9      6.5    ?Hemoglobin 12.0 - 15.0 g/dL 11.8   11.4      12.9    ?Hematocrit 36.0 - 46.0 % 36.1   34      36.6    ?Platelets 150 - 400 K/uL 186    224    ?  ? This result is from an external source.  ?  ? ?  Latest Ref Rng & Units 08/12/2021  ? 11:24 AM 06/26/2021  ? 10:54 AM 03/19/2021  ? 12:52 PM   ?CMP  ?Glucose 70 - 99 mg/dL 116      ?BUN 8 - 23 mg/dL 34      ?Creatinine 0.44 - 1.00 mg/dL 1.30   1.40     ?Sodium 135 - 145 mmol/L 140      ?Potassium 3.5 - 5.1 mmol/L 4.5      ?Chloride 98 - 111 mmol/L 109      ?CO2 22 - 32 mmol/L 23      ?Calcium 8.9 - 10.3 mg/dL 8.6      ?Total Protein 6.0 - 8.3 g/dL   6.5    ?Total Bilirubin 0.2 - 1.2 mg/dL   0.6    ?Alkaline Phos 39 - 117 U/L   133    ?AST 0 - 37 U/L   19    ?ALT 0 - 35 U/L   27    ?  ? ?Colonoscopy 08/19/2016 Dr. Henrene Pastor:  ?- One 1 mm polyp in the proximal transverse colon, removed with a cold snare. Resected. ?- The examination was otherwise normal on direct and retroflexion views. ?- No meaningful tissue available for pathologic submission. ?- No further colonoscopies recommended due to age   ? ?Colonoscopy 12/23/2007: ?-One 26m tubular adenomatous polyp removed from the ascending colon ?-Mild diverticulosis ?-Small internal hemorrhoids  ? ?  Current Outpatient Medications on File Prior to Visit  ?Medication Sig Dispense Refill  ? acetaminophen-codeine (TYLENOL #3) 300-30 MG tablet Take 1 tablet by mouth every 4 (four) hours as needed for moderate pain. 20 tablet 0  ? albuterol (VENTOLIN HFA) 108 (90 Base) MCG/ACT inhaler Inhale 2 puffs into the lungs every 6 (six) hours as needed for wheezing or shortness of breath.     ? amitriptyline (ELAVIL) 25 MG tablet Take 75 mg by mouth at bedtime.     ? aspirin 81 MG tablet Take 81 mg by mouth daily with breakfast.    ? atorvastatin (LIPITOR) 40 MG tablet Take 40 mg by mouth daily.    ? Cholecalciferol (VITAMIN D3) 25 MCG (1000 UT) CAPS 1,000 Units daily.    ? diclofenac (VOLTAREN) 75 MG EC tablet Take 75 mg by mouth daily.    ? docusate sodium (COLACE) 250 MG capsule Take 250 mg by mouth daily.    ? famotidine (PEPCID) 20 MG tablet Take 20 mg by mouth daily as needed for heartburn or indigestion.    ? HYDROcodone-acetaminophen (NORCO/VICODIN) 5-325 MG tablet Take 1 tablet by mouth every 6 (six) hours as needed. 20  tablet 0  ? levothyroxine (SYNTHROID) 25 MCG tablet Take 1 tablet (25 mcg total) by mouth daily before breakfast. 30 tablet 0  ? lidocaine (LIDODERM) 5 % Place 1 patch onto the skin 3 (three) times a week. Remove & Discard patch within 12 hours or as directed by MD    ? losartan-hydrochlorothiazide (HYZAAR) 100-25 MG tablet Take 1 tablet by mouth daily.    ? Magnesium 200 MG CHEW 200 mg See admin instructions.    ? methocarbamol (ROBAXIN) 500 MG tablet Take 1 tablet (500 mg total) by mouth at bedtime. 40 tablet 1  ? methylPREDNISolone (MEDROL) 4 MG tablet Take as directed 21 tablet 0  ? Multiple Vitamin (MULTIVITAMIN) tablet Take 1 tablet by mouth daily.    ? omeprazole (PRILOSEC) 20 MG capsule Take 20 mg by mouth daily with breakfast.    ? oxymetazoline (AFRIN) 0.05 % nasal spray Place 1 spray into both nostrils daily as needed for congestion.    ? Potassium 99 MG TABS 99 mg.    ? Potassium Gluconate 2.5 MEQ TABS Take 650 mg by mouth daily.    ? Psyllium (METAMUCIL) 28.3 % POWD     ? verapamil (CALAN-SR) 240 MG CR tablet Take 240 mg by mouth at bedtime.    ? VOLTAREN 1 % GEL Apply 2 g topically daily as needed (pain).    ? MAGNESIUM PO Take 1 tablet by mouth 2 (two) times daily. Gummie    ? ?No current facility-administered medications on file prior to visit.  ? ?Allergies  ?Allergen Reactions  ? Codeine Nausea And Vomiting  ?  REACTION: Lightheaded, nauses ?Was a high dose  ? Demerol [Meperidine Hcl] Other (See Comments)  ?  Dizziness   ? Phenergan [Promethazine] Other (See Comments)  ?  Feeling like heart attack  ? ?Current Medications, Allergies, Past Medical History, Past Surgical History, Family History and Social History were reviewed in [employer] record. ? ?Review of Systems:   ?Constitutional: Active sweat glands since teenage years.  ?Respiratory: Negative for shortness of breath.   ?Cardiovascular: Negative for chest pain, palpitations and leg swelling.  ?Gastrointestinal: See  HPI.  ?Musculoskeletal: Negative for back pain or muscle aches.  ?Neurological: Negative for dizziness, headaches or paresthesias.  ? ?Physical Exam: ?BP 120/70  Pulse 93   Ht '5\' 5"'$  (1.651 m)   Wt 205 lb (93 kg)   BMI 34.11 kg/m?  ? ?General: 76 year old female in no acute distress. ?Head: Normocephalic and atraumatic. ?Eyes: No scleral icterus. Conjunctiva pink . ?Ears: Normal auditory acuity. ?Mouth: Dentition intact. No ulcers or lesions.  ?Lungs: Clear throughout to auscultation. ?Heart: Regular rate and rhythm, no murmur. ?Abdomen: Soft, nontender and nondistended. No masses or hepatomegaly. Normal bowel sounds x 4 quadrants.  ?Rectal: Anal hemorrhoids inflamed without active bleeding or prolapse.  No mass within the region on exam.  Melissa CMA present during exam. ?Musculoskeletal: Symmetrical with no gross deformities. ?Extremities: No edema. ?Neurological: Alert oriented x 4. No focal deficits.  ?Psychological: Alert and cooperative. Normal mood and affect ? ?Assessment and Recommendations: ? ?84) 76 year old female with rectal bleeding and anorectal pain.  Nonbleeding anal hemorrhoids on exam.  Her most recent colonoscopy 08/19/2016 identified 1 mm polyp which was removed from the transverse colon.  Hemoglobin level 11.8 on 08/12/2021. ?-CBC, iron, TIBC and ferritin level ?-Preparation H suppository Q HS and Hydrocortisone 2.5% cream apply a small amount into the anal rectum nightly x 5 nights ?-I discussed scheduling a diagnostic colonoscopy secondary to rectal bleeding and anorectal pain, to discuss further with Dr. Henrene Pastor prior to scheduling.  ? -Ms. Rakes wishes to schedule a colonoscopy to alleviate any worry about having colorectal cancer ?-Continue Colace daily ? ?2) GERD, stable  ?-Continue Famotidine 20 mg as needed ? ?3) CKD ?-Patient requested labs to check her kidney function ?-CMP ?

## 2021-12-03 NOTE — Progress Notes (Signed)
Reviewed. ?If she is medically fit without contraindication, we could perform colonoscopy to evaluate rectal bleeding. ?Thanks ?Dr. Henrene Pastor ? ? ?

## 2021-12-04 ENCOUNTER — Other Ambulatory Visit: Payer: Self-pay

## 2021-12-04 DIAGNOSIS — K625 Hemorrhage of anus and rectum: Secondary | ICD-10-CM

## 2021-12-04 MED ORDER — NA SULFATE-K SULFATE-MG SULF 17.5-3.13-1.6 GM/177ML PO SOLN: ORAL | 0 refills | Status: DC

## 2021-12-04 NOTE — Progress Notes (Signed)
Cassidy Garcia, pls contact patient and schedule her for a colonoscopy with Dr. Henrene Pastor. Her physical exam was stable and she underwent surgery to remove her appendix/cecectomy and lysis of adhesions 08/2021 without any post operative complications. I discussed the colonoscopy procedure benefits and risks benefits and risks discussed including risk with sedation, risk of bleeding, perforation and infection with her at the time of her office visit 12/03/2021. Procedure was not schedule then as I was waiting for Dr. Blanch Media input before scheduling. THX. ? ? ? ?

## 2021-12-04 NOTE — Progress Notes (Signed)
Pt scheduled for colonoscopy at [facility] on 12/17/21 @ 4pm, arrival time 3pm. Amb referral placed for auth purposes. Prep instructions sent to pt via My Chart per her request. Rx sent to pt preferred pharmacy for [place]. ?

## 2021-12-17 ENCOUNTER — Telehealth: Payer: Self-pay | Admitting: Orthopaedic Surgery

## 2021-12-17 ENCOUNTER — Ambulatory Visit (AMBULATORY_SURGERY_CENTER): Payer: Medicare Other | Admitting: Internal Medicine

## 2021-12-17 ENCOUNTER — Encounter: Payer: Self-pay | Admitting: Internal Medicine

## 2021-12-17 VITALS — BP 115/70 | HR 92 | Temp 95.1°F | Resp 13 | Ht 65.0 in | Wt 205.0 lb

## 2021-12-17 DIAGNOSIS — Z86010 Personal history of colonic polyps: Secondary | ICD-10-CM

## 2021-12-17 DIAGNOSIS — K648 Other hemorrhoids: Secondary | ICD-10-CM | POA: Diagnosis not present

## 2021-12-17 DIAGNOSIS — K625 Hemorrhage of anus and rectum: Secondary | ICD-10-CM | POA: Diagnosis not present

## 2021-12-17 MED ORDER — SODIUM CHLORIDE 0.9 % IV SOLN: 500.0000 mL | Freq: Once | INTRAVENOUS | Status: DC

## 2021-12-17 NOTE — Op Note (Signed)
[place] ?Patient Name: Cassidy Garcia ?Procedure Date: 12/17/2021 3:11 PM ?MRN: [id number] ?Endoscopist: Docia Chuck. Henrene Pastor , MD ?Age: 76 ?Referring MD:  ?Date of Birth: 12-Aug-1946 ?Gender: Female ?Account #: [id number] ?Procedure:                Colonoscopy ?Indications:              Rectal bleeding. Also history of colon polyps.  ?                          Previous examinations 2004, 2009, 2012, 2018 ?Medicines:                Monitored Anesthesia Care ?Procedure:                Pre-Anesthesia Assessment: ?                          - Prior to the procedure, a History and Physical  ?                          was performed, and patient medications and  ?                          allergies were reviewed. The patient's tolerance of  ?                          previous anesthesia was also reviewed. The risks  ?                          and benefits of the procedure and the sedation  ?                          options and risks were discussed with the patient.  ?                          All questions were answered, and informed consent  ?                          was obtained. Prior Anticoagulants: The patient has  ?                          taken no previous anticoagulant or antiplatelet  ?                          agents. ASA Grade Assessment: II - A patient with  ?                          mild systemic disease. After reviewing the risks  ?                          and benefits, the patient was deemed in  ?                          satisfactory condition to undergo the procedure. ?  After obtaining informed consent, the colonoscope  ?                          was passed under direct vision. Throughout the  ?                          procedure, the patient's blood pressure, pulse, and  ?                          oxygen saturations were monitored continuously. The  ?                          Olympus CF-HQ190L (Serial# [id number]) Colonoscope was  ?                          introduced through the  anus and advanced to the the  ?                          cecum, identified by appendiceal orifice and  ?                          ileocecal valve. The ileocecal valve, appendiceal  ?                          orifice, and rectum were photographed. The quality  ?                          of the bowel preparation was excellent. The  ?                          colonoscopy was performed without difficulty. The  ?                          patient tolerated the procedure well. The bowel  ?                          preparation used was SUPREP via split dose  ?                          instruction. ?Scope In: 3:25:25 PM ?Scope Out: 3:39:05 PM ?Scope Withdrawal Time: 0 hours 10 minutes 9 seconds  ?Total Procedure Duration: 0 hours 13 minutes 40 seconds  ?Findings:                 External and internal hemorrhoids were found during  ?                          retroflexion. The hemorrhoids were small. ?                          The exam was otherwise without abnormality on  ?                          direct and retroflexion views. ?Complications:  No immediate complications. Estimated blood loss:  ?                          None. ?Estimated Blood Loss:     Estimated blood loss: none. ?Impression:               - External and internal hemorrhoids. ?                          - The examination was otherwise normal on direct  ?                          and retroflexion views. ?                          - No specimens collected. ?Recommendation:           - Repeat colonoscopy is not recommended for  ?                          surveillance. ?                          - Patient has a contact number available for  ?                          emergencies. The signs and symptoms of potential  ?                          delayed complications were discussed with the  ?                          patient. Return to normal activities tomorrow.  ?                          Written discharge instructions were provided to the  ?                           patient. ?                          - Resume previous diet. ?                          - Continue present medications. ?Docia Chuck. Henrene Pastor, MD ?12/17/2021 3:43:13 PM ?This report has been signed electronically. ?

## 2021-12-17 NOTE — Progress Notes (Signed)
Pt non-responsive, VVS, Report to RN  °

## 2021-12-17 NOTE — Patient Instructions (Signed)

## 2021-12-17 NOTE — Progress Notes (Signed)
VS  DT ? ?Pt's states no medical or surgical changes since previsit or office visit. ? ?

## 2021-12-17 NOTE — Progress Notes (Signed)
Chief Complaint: Hemorrhoids ?  ?History of Present Illness: Cassidy Garcia is a 76 year old female with a past medical history of depression, arthritis, asthma, hypertension, hyperlipidemia, CKD stage 3, chronic lower back pain with sciatica, GERD and colon polyps. Past cataract surgery, hysterectomy and cholecystectomy. 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 S/P laparoscopic appendectomy with partial cecectomy and lysis of adhesions 08/26/2021. ?  ?She presents to our office today for further evaluation regarding hemorrhoids. She developed rectal bleeding 3 months ago. She passed a normal formed stool with a moderate amount of bright red blood which occurred x 1 episode daily x 2 days. On the third day, she developed anorectal pain with less blood with a BM. Since then, her rectal bleeding has significantly reduced and no further anorectal pain  for the past few days. She started taking Colace '250mg'$  once daily about one month ago.  She reported completing FOBT as ordered by her PCP 07/2021 which was positive but she noted having hemorrhoidal bleeding at that time.  She takes diclofenac daily for arthritis, she stopped taking it 2 days ago.  Her most recent colonoscopy by Dr. Henrene Pastor was 08/19/2016 which identified a 1 mm polyp which was removed from the transverse colon, no meaningful tissue available for pathologic submission.  No further colon polyp surveillance colonoscopies were recommended due to age.  No GERD symptoms. ?  ?  ?  Latest Ref Rng & Units 08/12/2021  ? 11:24 AM 10/13/2020  ? 12:00 AM 08/07/2020  ?  3:54 PM  ?CBC  ?WBC 4.0 - 10.5 K/uL 6.1   8.9      6.5    ?Hemoglobin 12.0 - 15.0 g/dL 11.8   11.4      12.9    ?Hematocrit 36.0 - 46.0 % 36.1   34      36.6    ?Platelets 150 - 400 K/uL 186     224    ?  ? This result is from an external source.  ?  ?  ?  Latest Ref Rng & Units 08/12/2021  ? 11:24 AM 06/26/2021  ? 10:54 AM 03/19/2021  ? 12:52 PM  ?CMP  ?Glucose 70 - 99 mg/dL 116        ?BUN 8 - 23  mg/dL 34        ?Creatinine 0.44 - 1.00 mg/dL 1.30   1.40      ?Sodium 135 - 145 mmol/L 140        ?Potassium 3.5 - 5.1 mmol/L 4.5        ?Chloride 98 - 111 mmol/L 109        ?CO2 22 - 32 mmol/L 23        ?Calcium 8.9 - 10.3 mg/dL 8.6        ?Total Protein 6.0 - 8.3 g/dL     6.5    ?Total Bilirubin 0.2 - 1.2 mg/dL     0.6    ?Alkaline Phos 39 - 117 U/L     133    ?AST 0 - 37 U/L     19    ?ALT 0 - 35 U/L     27    ?  ?  ?Colonoscopy 08/19/2016 Dr. Henrene Pastor:  ?- One 1 mm polyp in the proximal transverse colon, removed with a cold snare. Resected. ?- The examination was otherwise normal on direct and retroflexion views. ?- No meaningful tissue available for pathologic submission. ?- No further colonoscopies recommended due to age   ?  ?  Colonoscopy 12/23/2007: ?-One 83m tubular adenomatous polyp removed from the ascending colon ?-Mild diverticulosis ?-Small internal hemorrhoids  ?  ?      ?Current Outpatient Medications on File Prior to Visit  ?Medication Sig Dispense Refill  ? acetaminophen-codeine (TYLENOL #3) 300-30 MG tablet Take 1 tablet by mouth every 4 (four) hours as needed for moderate pain. 20 tablet 0  ? albuterol (VENTOLIN HFA) 108 (90 Base) MCG/ACT inhaler Inhale 2 puffs into the lungs every 6 (six) hours as needed for wheezing or shortness of breath.       ? amitriptyline (ELAVIL) 25 MG tablet Take 75 mg by mouth at bedtime.       ? aspirin 81 MG tablet Take 81 mg by mouth daily with breakfast.      ? atorvastatin (LIPITOR) 40 MG tablet Take 40 mg by mouth daily.      ? Cholecalciferol (VITAMIN D3) 25 MCG (1000 UT) CAPS 1,000 Units daily.      ? diclofenac (VOLTAREN) 75 MG EC tablet Take 75 mg by mouth daily.      ? docusate sodium (COLACE) 250 MG capsule Take 250 mg by mouth daily.      ? famotidine (PEPCID) 20 MG tablet Take 20 mg by mouth daily as needed for heartburn or indigestion.      ? HYDROcodone-acetaminophen (NORCO/VICODIN) 5-325 MG tablet Take 1 tablet by mouth every 6 (six) hours as needed. 20  tablet 0  ? levothyroxine (SYNTHROID) 25 MCG tablet Take 1 tablet (25 mcg total) by mouth daily before breakfast. 30 tablet 0  ? lidocaine (LIDODERM) 5 % Place 1 patch onto the skin 3 (three) times a week. Remove & Discard patch within 12 hours or as directed by MD      ? losartan-hydrochlorothiazide (HYZAAR) 100-25 MG tablet Take 1 tablet by mouth daily.      ? Magnesium 200 MG CHEW 200 mg See admin instructions.      ? methocarbamol (ROBAXIN) 500 MG tablet Take 1 tablet (500 mg total) by mouth at bedtime. 40 tablet 1  ? methylPREDNISolone (MEDROL) 4 MG tablet Take as directed 21 tablet 0  ? Multiple Vitamin (MULTIVITAMIN) tablet Take 1 tablet by mouth daily.      ? omeprazole (PRILOSEC) 20 MG capsule Take 20 mg by mouth daily with breakfast.      ? oxymetazoline (AFRIN) 0.05 % nasal spray Place 1 spray into both nostrils daily as needed for congestion.      ? Potassium 99 MG TABS 99 mg.      ? Potassium Gluconate 2.5 MEQ TABS Take 650 mg by mouth daily.      ? Psyllium (METAMUCIL) 28.3 % POWD        ? verapamil (CALAN-SR) 240 MG CR tablet Take 240 mg by mouth at bedtime.      ? VOLTAREN 1 % GEL Apply 2 g topically daily as needed (pain).      ? MAGNESIUM PO Take 1 tablet by mouth 2 (two) times daily. Gummie      ?  ?No current facility-administered medications on file prior to visit.  ?  ?     ?Allergies  ?Allergen Reactions  ? Codeine Nausea And Vomiting  ?    REACTION: Lightheaded, nauses ?Was a high dose  ? Demerol [Meperidine Hcl] Other (See Comments)  ?    Dizziness   ? Phenergan [Promethazine] Other (See Comments)  ?    Feeling like heart attack  ?  ?Current  Medications, Allergies, Past Medical History, Past Surgical History, Family History and Social History were reviewed in [employer] record. ?  ?Review of Systems:   ?Constitutional: Active sweat glands since teenage years.  ?Respiratory: Negative for shortness of breath.   ?Cardiovascular: Negative for chest pain, palpitations and  leg swelling.  ?Gastrointestinal: See HPI.  ?Musculoskeletal: Negative for back pain or muscle aches.  ?Neurological: Negative for dizziness, headaches or paresthesias.  ?  ?Physical Exam: ?BP 120/70   Pulse 93   Ht '5\' 5"'$  (1.651 m)   Wt 205 lb (93 kg)   BMI 34.11 kg/m?  ?  ?General: 76 year old female in no acute distress. ?Head: Normocephalic and atraumatic. ?Eyes: No scleral icterus. Conjunctiva pink . ?Ears: Normal auditory acuity. ?Mouth: Dentition intact. No ulcers or lesions.  ?Lungs: Clear throughout to auscultation. ?Heart: Regular rate and rhythm, no murmur. ?Abdomen: Soft, nontender and nondistended. No masses or hepatomegaly. Normal bowel sounds x 4 quadrants.  ?Rectal: Anal hemorrhoids inflamed without active bleeding or prolapse.  No mass within the region on exam.  Melissa CMA present during exam. ?Musculoskeletal: Symmetrical with no gross deformities. ?Extremities: No edema. ?Neurological: Alert oriented x 4. No focal deficits.  ?Psychological: Alert and cooperative. Normal mood and affect ?  ?Assessment and Recommendations: ?  ?30) 76 year old female with rectal bleeding and anorectal pain.  Nonbleeding anal hemorrhoids on exam.  Her most recent colonoscopy 08/19/2016 identified 1 mm polyp which was removed from the transverse colon.  Hemoglobin level 11.8 on 08/12/2021. ?-CBC, iron, TIBC and ferritin level ?-Preparation H suppository Q HS and Hydrocortisone 2.5% cream apply a small amount into the anal rectum nightly x 5 nights ?-I discussed scheduling a diagnostic colonoscopy secondary to rectal bleeding and anorectal pain, to discuss further with Dr. Henrene Pastor prior to scheduling.  ? -Ms. Patty wishes to schedule a colonoscopy to alleviate any worry about having colorectal cancer ?-Continue Colace daily ?  ?2) GERD, stable  ?-Continue Famotidine 20 mg as needed ?  ?3) CKD ?-Patient requested labs to check her kidney function ?

## 2021-12-21 ENCOUNTER — Telehealth: Payer: Self-pay | Admitting: *Deleted

## 2021-12-21 ENCOUNTER — Encounter: Payer: Self-pay | Admitting: Physician Assistant

## 2021-12-21 ENCOUNTER — Ambulatory Visit (INDEPENDENT_AMBULATORY_CARE_PROVIDER_SITE_OTHER): Payer: Medicare Other | Admitting: Physician Assistant

## 2021-12-21 DIAGNOSIS — M5441 Lumbago with sciatica, right side: Secondary | ICD-10-CM | POA: Diagnosis not present

## 2021-12-21 DIAGNOSIS — G8929 Other chronic pain: Secondary | ICD-10-CM | POA: Diagnosis not present

## 2021-12-21 NOTE — Progress Notes (Signed)
? ?Office Visit Note ?  ?Patient: Cassidy Garcia           ?Date of Birth: 18-Jul-1946           ?MRN: [id number] ?Visit Date: 12/21/2021 ?             ?Requested by: Burnard Bunting, MD ?[address] ?[place],  [place] [address] ?PCP: Burnard Bunting, MD ? ?Chief Complaint  ?Patient presents with  ? Right Leg - Follow-up  ? ? ? ? ?HPI: ?Patient is a pleasant 76 year old woman with a chief complaint of numbness in her right leg.  She denies any calf pain.  She has had some baseline numbness in her feet.  She has been followed by Dr. Ninfa Linden and Benita Stabile for lower back pain.  She is due to begin physical therapy this week and just wants to make sure nothing else needs to be changed.  She admits the numbness in her right leg has gone away ? ?Assessment & Plan: ?Visit Diagnoses:  ?1. Chronic right-sided low back pain with right-sided sciatica   ? ? ?Plan: Patient has good strength today.  She has at her baseline with no numbness in the right leg just some sensation alteration in her feet that have been present for a while.  I think she can begin physical therapy and follow-up in 6 weeks with Gordy Levan and Dr. Ninfa Linden.  She was given strict return precautions such as legs giving way numbness that persists or gets worse or in retractable pain. ? ?Follow-Up Instructions: 6 weeks ? ?Ortho Exam ? ?Patient is alert, oriented, no adenopathy, well-dressed, normal affect, normal respiratory effort. ?Examination she has some altered sensation in her toes sensation is intact in her lower legs she has 5 out of 5 strength with resisted plantarflexion dorsiflexion of her ankles resisted flexion extension of her knees and hips. ? ?Imaging: ?No results found. ?No images are attached to the encounter. ? ?Labs: ?Lab Results  ?Component Value Date  ? HGBA1C 5.3 08/12/2021  ? ESRSEDRATE 60 10/13/2020  ? REPTSTATUS 01/25/2020 FINAL 01/24/2020  ? CULT  01/24/2020  ?  NO GROWTH ?Performed at [facility], [place] [address] [place],  [place] [address] ?  ? ? ? ?Lab Results  ?Component Value Date  ? ALBUMIN 4.4 12/03/2021  ? ALBUMIN 4.2 03/19/2021  ? ALBUMIN 3.8 12/04/2020  ? ? ?Lab Results  ?Component Value Date  ? MG 2.0 01/27/2020  ? MG 1.5 (L) 01/26/2020  ? MG 0.7 (LL) 01/25/2020  ? ?No results found for: VD25OH ? ?No results found for: PREALBUMIN ? ?  Latest Ref Rng & Units 12/03/2021  ? 11:37 AM 08/12/2021  ? 11:24 AM 10/13/2020  ? 12:00 AM  ?CBC EXTENDED  ?WBC 4.0 - 10.5 K/uL 7.3   6.1   8.9       ?RBC 3.87 - 5.11 Mil/uL 3.94   3.71   3.8       ?Hemoglobin 12.0 - 15.0 g/dL 12.6   11.8   11.4       ?HCT 36.0 - 46.0 % 37.4   36.1   34       ?Platelets 150.0 - 400.0 K/uL 201.0   186     ?  ? This result is from an external source.  ? ? ? ?There is no height or weight on file to calculate BMI. ? ?Orders:  ?No orders of the defined types were placed in this encounter. ? ?No orders of  the defined types were placed in this encounter. ? ? ? Procedures: ?No procedures performed ? ?Clinical Data: ?No additional findings. ? ?ROS: ? ?All other systems negative, except as noted in the HPI. ?Review of Systems ? ?Objective: ?Vital Signs: There were no vitals taken for this visit. ? ?Specialty Comments:  ?No specialty comments available. ? ?PMFS History: ?Patient Active Problem List  ? Diagnosis Date Noted  ? Nausea 12/02/2021  ? Bright red rectal bleeding 09/15/2021  ? History of colonic polyps 07/13/2021  ? Gastroenteritis 09/09/2020  ? Anemia 03/06/2020  ? Hypothyroidism 02/04/2020  ? Depression 01/24/2020  ? Asthma 01/24/2020  ? CKD (chronic kidney disease), stage III ([place]) 01/24/2020  ? Acute on chronic renal failure ([place]) 01/24/2020  ? Constipation 12/20/2018  ? Pain in right hip 06/29/2017  ? Trochanteric bursitis, right hip 06/29/2017  ? 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 Hemorrhoids 03/01/2017  ? Great toe pain, left 12/04/2016  ? Chronic right shoulder pain 12/04/2016  ? Non-toxic multinodular goiter 05/14/2013  ? Degenerative  arthritis of hip 12/10/2011  ? HTN (hypertension) 07/06/2011  ? Hyperlipidemia 07/06/2011  ? Gastro-esophageal reflux disease without esophagitis 09/23/2009  ? ?Past Medical History:  ?Diagnosis Date  ? Arthritis   ? Asthma   ? Bronchitis 03/2016  ? Chronic kidney disease   ? stage 3  ? Colon polyps   ? tubular adenoma  ? Constipation   ? Depression   ? Diverticulosis   ? GERD (gastroesophageal reflux disease)   ? Hemorrhoids   ? Hyperlipidemia   ? Hypertension   ? Hypothyroidism   ? Shortness of breath   ? with exertion on occasion  ? Sinusitis   ? Skin cancer   ?  ?Family History  ?Problem Relation Age of Onset  ? Ovarian cancer Mother   ? Anxiety disorder Mother   ? Heart disease Paternal Grandmother   ? Other Paternal Aunt   ?     brain tumor  ? Birth defects Sister   ?     no iris  ? Depression Sister   ? Anxiety disorder Sister   ? Colon cancer Neg Hx   ?  ?Past Surgical History:  ?Procedure Laterality Date  ? ABDOMINAL HYSTERECTOMY    ? CARPOMETACARPAL (CMC) FUSION OF THUMB Bilateral   ? CATARACT EXTRACTION, BILATERAL    ? CHOLECYSTECTOMY    ? COLONOSCOPY  02/12/2011  ? HAMMER TOE SURGERY Right   ? LAPAROSCOPIC APPENDECTOMY N/A 08/26/2021  ? Procedure: APPENDECTOMY LAPAROSCOPIC WITH LYSIS OF ADHESIONS;  Surgeon: Michael Boston, MD;  Location: [facility];  Service: General;  Laterality: N/A;  ? NASAL SINUS SURGERY    ? POLYPECTOMY    ? TOTAL HIP ARTHROPLASTY  12/10/2011  ? Procedure: TOTAL HIP ARTHROPLASTY ANTERIOR APPROACH;  Surgeon: Mcarthur Rossetti, MD;  Location: [facility];  Service: Orthopedics;  Laterality: Right;  Right Total Hip Arthroplasty, Anterior Approach  ? ?Social History  ? ?Occupational History  ? Occupation: retired  ?  Employer: RETIRED  ?Tobacco Use  ? Smoking status: Former  ?  Types: Cigarettes  ?  Quit date: 08/16/2001  ?  Years since quitting: 20.3  ? Smokeless tobacco: Never  ?Vaping Use  ? Vaping Use: Never used  ?Substance and Sexual Activity  ? Alcohol use: Not Currently  ?  Comment: rare   ? Drug use: No  ? Sexual activity: Not on file  ? ? ? ? ? ?

## 2021-12-21 NOTE — Telephone Encounter (Signed)
First attempt for follow up phone call. No answer at number given.  Left message on voicemail.   ?

## 2021-12-21 NOTE — Telephone Encounter (Signed)
?  Follow up Call- ? ? ?  12/17/2021  ?  2:30 PM  ?Call back number  ?Post procedure Call Back phone  # [phone]  ?Permission to leave phone message Yes  ?  ? ?Patient questions: ? ?Do you have a fever, pain , or abdominal swelling? No. ?Pain Score  0 * ? ?Have you tolerated food without any problems? Yes.   ? ?Have you been able to return to your normal activities? Yes.   ? ?Do you have any questions about your discharge instructions: ?Diet   No. ?Medications  No. ?Follow up visit  No. ? ?Do you have questions or concerns about your Care? No. ? ?Actions: ?* If pain score is 4 or above: ?No action needed, pain <4. ? ? ?

## 2021-12-23 DIAGNOSIS — M25551 Pain in right hip: Secondary | ICD-10-CM | POA: Diagnosis not present

## 2021-12-23 DIAGNOSIS — R2681 Unsteadiness on feet: Secondary | ICD-10-CM | POA: Diagnosis not present

## 2021-12-23 DIAGNOSIS — M545 Low back pain, unspecified: Secondary | ICD-10-CM | POA: Diagnosis not present

## 2021-12-30 ENCOUNTER — Encounter: Payer: Self-pay | Admitting: Orthopaedic Surgery

## 2021-12-30 ENCOUNTER — Other Ambulatory Visit: Payer: Self-pay

## 2021-12-30 ENCOUNTER — Ambulatory Visit (INDEPENDENT_AMBULATORY_CARE_PROVIDER_SITE_OTHER): Payer: Medicare Other | Admitting: Orthopaedic Surgery

## 2021-12-30 DIAGNOSIS — G8929 Other chronic pain: Secondary | ICD-10-CM

## 2021-12-30 DIAGNOSIS — M545 Low back pain, unspecified: Secondary | ICD-10-CM | POA: Diagnosis not present

## 2021-12-30 DIAGNOSIS — R2681 Unsteadiness on feet: Secondary | ICD-10-CM | POA: Diagnosis not present

## 2021-12-30 DIAGNOSIS — M5441 Lumbago with sciatica, right side: Secondary | ICD-10-CM

## 2021-12-30 DIAGNOSIS — M25551 Pain in right hip: Secondary | ICD-10-CM | POA: Diagnosis not present

## 2021-12-30 NOTE — Progress Notes (Signed)
The patient comes in today with continued right-sided low back pain with sciatica and radicular symptoms going down her right leg.  We have seen her for this before.  She has tried anti-inflammatories and is an active physical therapy now.  She is still having the same symptoms.  Plain films of her lumbar spine show significant degenerative changes throughout the lumbar spine. ? ?She does have subjective pain in the sciatic region on the right side and a positive straight leg raise on the right side.  She has subjective numbness on the lateral aspect of her right leg that is not present on the left leg. ? ?At this point given the failure of all conservative treatment measures for over 6 weeks including physical therapy, a MRI of the lumbar spine is warranted to assess for nerve compression and stenosis to help guide additional intervention such as epidural steroid injections.  She agrees with this treatment plan she is a very active 76 year old female. ?

## 2022-01-05 ENCOUNTER — Other Ambulatory Visit: Payer: Self-pay | Admitting: Physician Assistant

## 2022-01-05 DIAGNOSIS — R2681 Unsteadiness on feet: Secondary | ICD-10-CM | POA: Diagnosis not present

## 2022-01-05 DIAGNOSIS — M545 Low back pain, unspecified: Secondary | ICD-10-CM | POA: Diagnosis not present

## 2022-01-05 DIAGNOSIS — M25551 Pain in right hip: Secondary | ICD-10-CM | POA: Diagnosis not present

## 2022-01-06 ENCOUNTER — Other Ambulatory Visit: Payer: Self-pay | Admitting: Physician Assistant

## 2022-01-06 MED ORDER — ACETAMINOPHEN-CODEINE 300-30 MG PO TABS: 1.0000 | ORAL_TABLET | Freq: Four times a day (QID) | ORAL | 0 refills | Status: DC | PRN

## 2022-01-07 DIAGNOSIS — R2681 Unsteadiness on feet: Secondary | ICD-10-CM | POA: Diagnosis not present

## 2022-01-07 DIAGNOSIS — M25551 Pain in right hip: Secondary | ICD-10-CM | POA: Diagnosis not present

## 2022-01-07 DIAGNOSIS — M545 Low back pain, unspecified: Secondary | ICD-10-CM | POA: Diagnosis not present

## 2022-01-12 DIAGNOSIS — M545 Low back pain, unspecified: Secondary | ICD-10-CM | POA: Diagnosis not present

## 2022-01-12 DIAGNOSIS — M25551 Pain in right hip: Secondary | ICD-10-CM | POA: Diagnosis not present

## 2022-01-12 DIAGNOSIS — R2681 Unsteadiness on feet: Secondary | ICD-10-CM | POA: Diagnosis not present

## 2022-01-14 DIAGNOSIS — M25551 Pain in right hip: Secondary | ICD-10-CM | POA: Diagnosis not present

## 2022-01-14 DIAGNOSIS — R2681 Unsteadiness on feet: Secondary | ICD-10-CM | POA: Diagnosis not present

## 2022-01-14 DIAGNOSIS — M545 Low back pain, unspecified: Secondary | ICD-10-CM | POA: Diagnosis not present

## 2022-01-19 DIAGNOSIS — M545 Low back pain, unspecified: Secondary | ICD-10-CM | POA: Diagnosis not present

## 2022-01-19 DIAGNOSIS — R2681 Unsteadiness on feet: Secondary | ICD-10-CM | POA: Diagnosis not present

## 2022-01-19 DIAGNOSIS — M25551 Pain in right hip: Secondary | ICD-10-CM | POA: Diagnosis not present

## 2022-01-20 ENCOUNTER — Ambulatory Visit
Admission: RE | Admit: 2022-01-20 | Discharge: 2022-01-20 | Disposition: A | Discharge status: Home / Self Care | Payer: Medicare Other | Source: Ambulatory Visit | Attending: Orthopaedic Surgery | Admitting: Orthopaedic Surgery

## 2022-01-20 DIAGNOSIS — M48061 Spinal stenosis, lumbar region without neurogenic claudication: Secondary | ICD-10-CM | POA: Diagnosis not present

## 2022-01-20 DIAGNOSIS — G8929 Other chronic pain: Secondary | ICD-10-CM

## 2022-01-20 DIAGNOSIS — M545 Low back pain, unspecified: Secondary | ICD-10-CM | POA: Diagnosis not present

## 2022-01-20 DIAGNOSIS — M5136 Other intervertebral disc degeneration, lumbar region: Secondary | ICD-10-CM | POA: Diagnosis not present

## 2022-01-20 IMAGING — MR MR LUMBAR SPINE W/O CM
4 of 5 series · 26 of 48 positions shown · non-contrast
Comparison: Radiographs [DATE].

CLINICAL DATA: Chronic right-sided low back pain with right-sided
sciatica M54.41, [9S] ([9S]-CM).

EXAM:
MRI LUMBAR SPINE WITHOUT CONTRAST
TECHNIQUE: Multiplanar, multisequence MR imaging of the lumbar spine was
performed. No intravenous contrast was administered.

[Series 2: T2 · sagittal · 4.0mm · 1.09mm/px · 6 of 17 slices shown (1 of 2)]
[im 1/17]
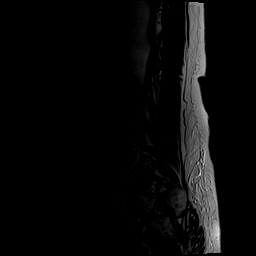
[im 4/17]
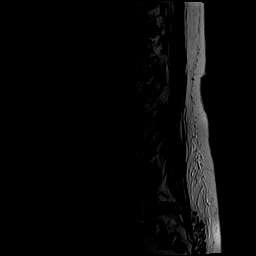
[im 7/17]
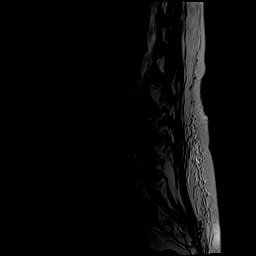
[im 10/17]
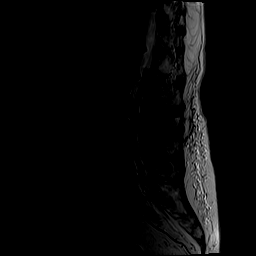
[im 13/17]
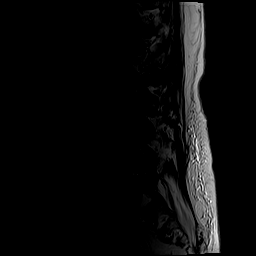
[im 17/17]
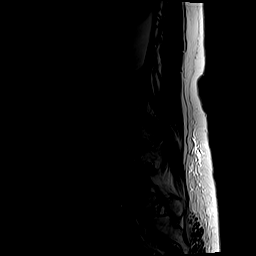

[Series 4: T1 · sagittal · 4.0mm · 1.09mm/px · 5 of 17 slices shown (1 of 2)]
[im 1/17]
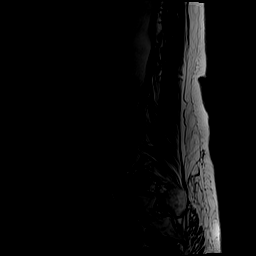
[im 5/17]
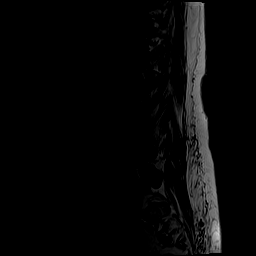
[im 9/17]
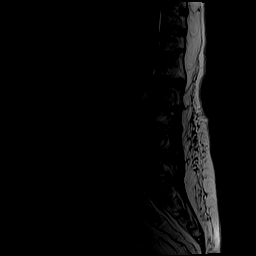
[im 13/17]
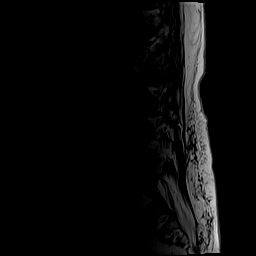
[im 17/17]
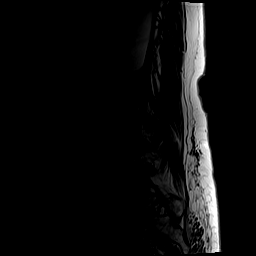

[Series 5: T2 · axial · 3.0mm · 0.39mm/px · z∈[-106,+85]mm · 10 of 50 slices shown (2 of 2)]
[im 4/50]
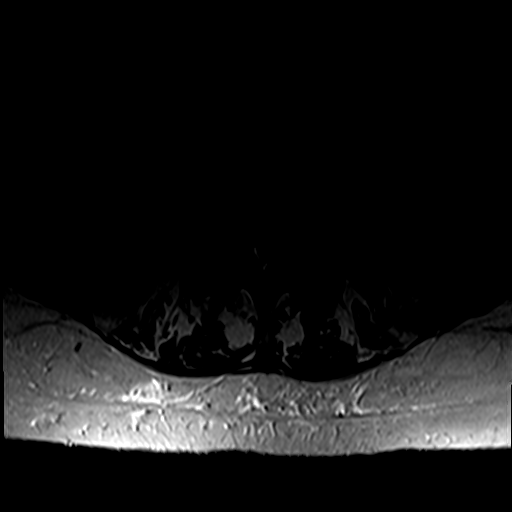
[im 7/50]
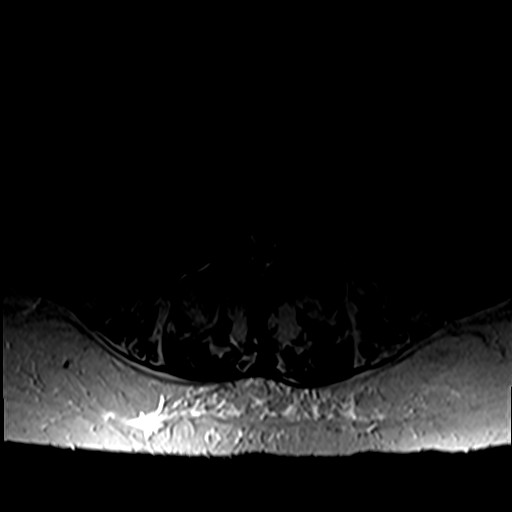
[im 10/50]
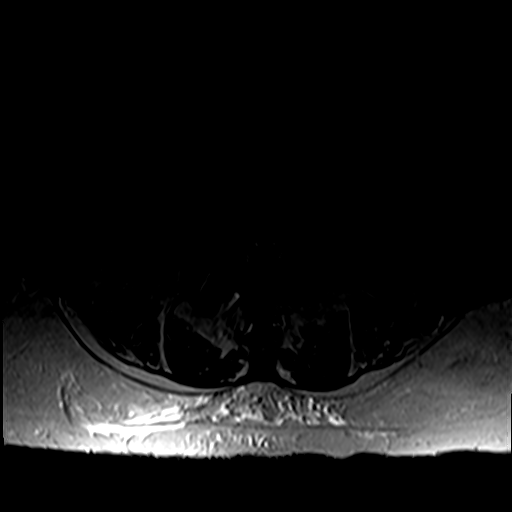
[im 17/50]
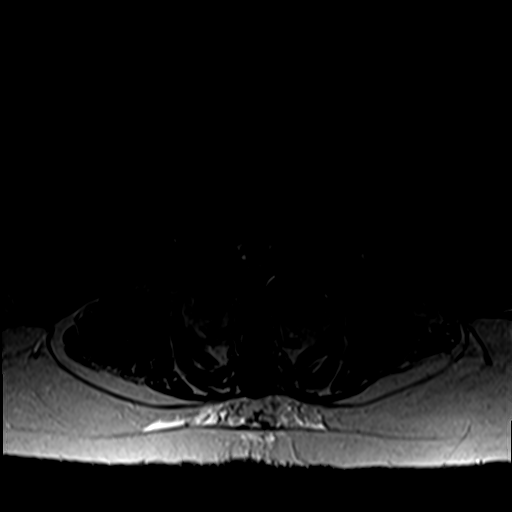
[im 23/50]
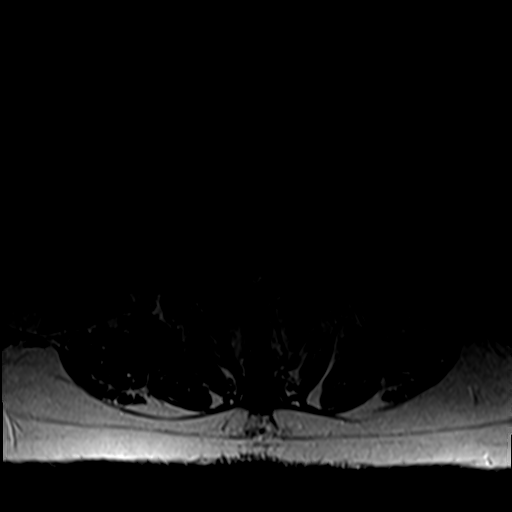
[im 27/50]
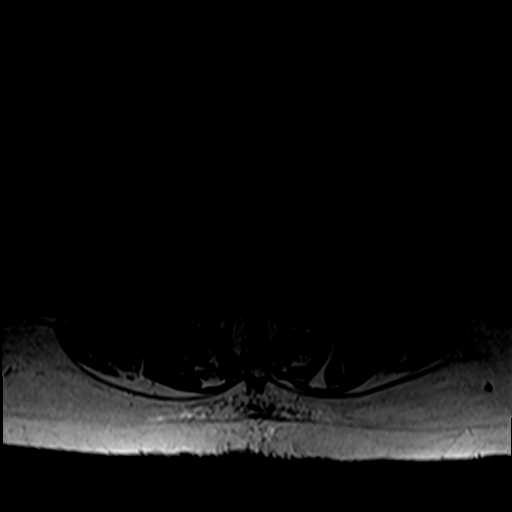
[im 30/50]
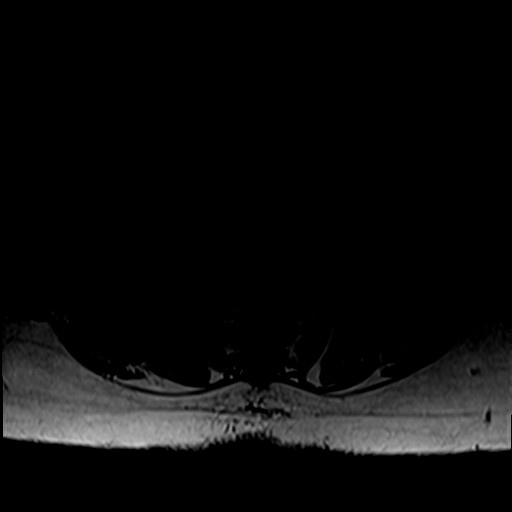
[im 36/50]
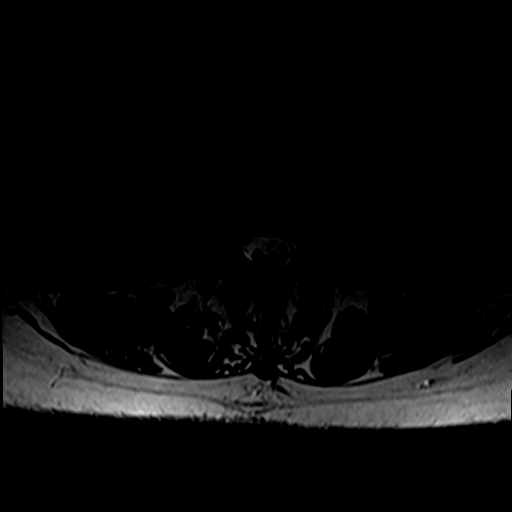
[im 43/50]
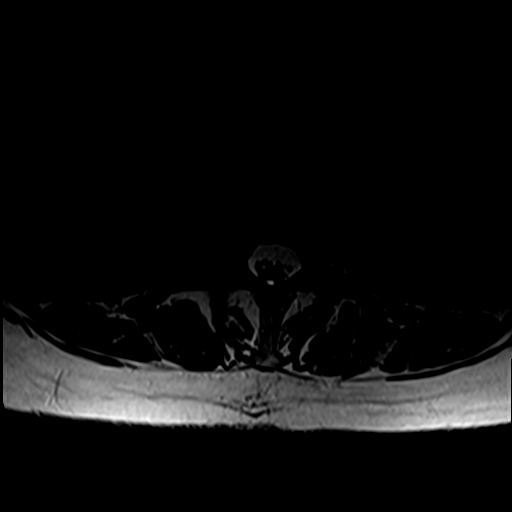
[im 50/50]
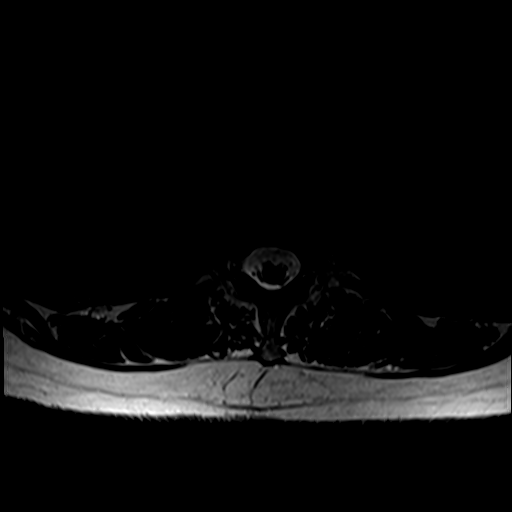

[Series 6: T1 · axial · 3.0mm · 0.39mm/px · z∈[-106,+60]mm · 5 of 50 slices shown (2 of 2)]
[im 4/50]
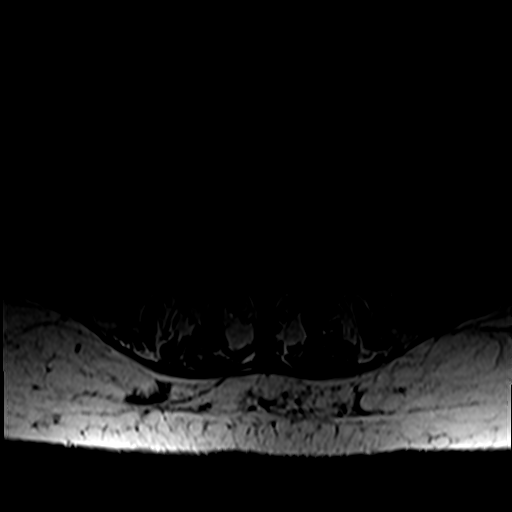
[im 7/50]
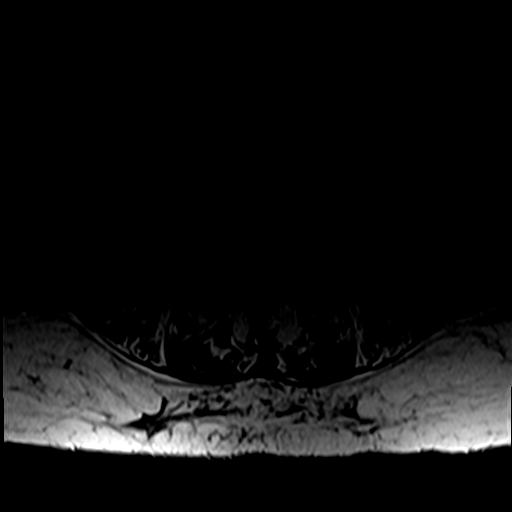
[im 10/50]
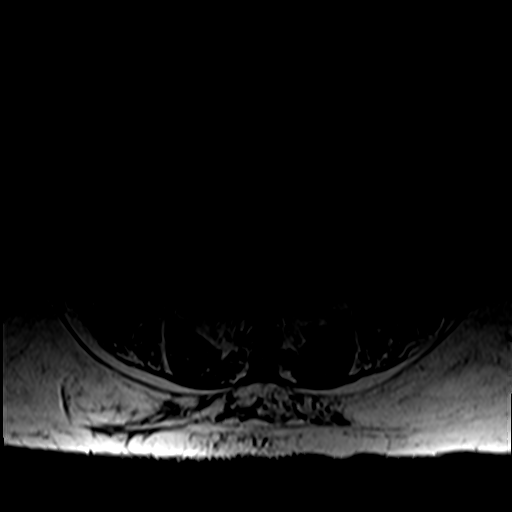
[im 27/50]
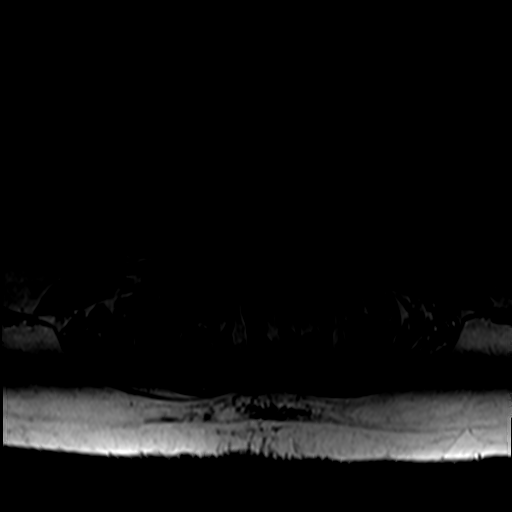
[im 43/50]
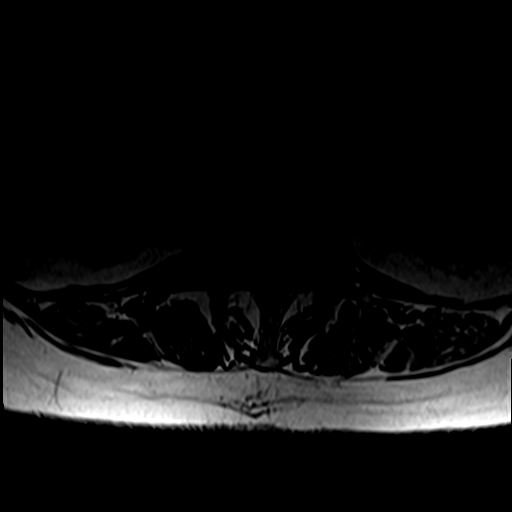

[26 of 48 positions shown; findings below may reference images not displayed]

FINDINGS: Segmentation:  Standard.

Alignment: Dextroconvex scoliosis. Small anterolisthesis at L3-4 and
L4-5. Small retrolisthesis at L1-2, L2-3 and L5-S1.

Vertebrae: No fracture, evidence of discitis, or bone lesion.
Endplate degenerative changes with loss of disc height and Schmorl
nodes throughout the lumbar spine with associated marrow edema at
L3-4 and L4-5.

Conus medullaris and cauda equina: Conus extends to the L1 level.
Conus and cauda equina appear normal.

Paraspinal and other soft tissues: Negative.

Disc levels:

T12-L1: Disc bulge and moderate facet degenerative changes without
significant spinal canal or neural foraminal stenosis.

L1-2: Disc bulge, moderate facet degenerative changes and ligamentum
flavum redundancy resulting in mild spinal canal stenosis with
narrowing of the bilateral subarticular zones and mild bilateral
foraminal.

L2-3: Disc bulge, moderate hypertrophic facet degenerative changes
and ligamentum flavum redundancy resulting in mild spinal canal
stenosis, mild right and moderate left neural foraminal narrowing.

L3-4: Disc bulge, prominent hypertrophic facet degenerative changes
with bilateral joint effusion and ligamentum flavum redundancy
resulting in severe spinal canal stenosis, mild right and severe
left neural foraminal.

L4-5: Disc bulge, prominent hypertrophic facet degenerative changes
with bilateral joint effusion and ligamentum flavum redundancy
resulting in severe spinal canal stenosis, mild right and moderate
left neural foraminal narrowing.

L5-S1: Disc bulge, prominent hypertrophic facet degenerative changes
with bilateral joint effusion and ligamentum flavum redundancy
resulting in severe spinal canal stenosis and mild-to-moderate
bilateral foraminal narrowing.
IMPRESSION: 1. Advanced degenerative changes of the lumbar spine with severe
spinal canal stenosis at L3-4 and L4-5.
2. Multilevel neural foraminal narrowing severe on the left at L3-4
and moderate on the left at L2-3 and L4-5.

## 2022-01-21 DIAGNOSIS — M545 Low back pain, unspecified: Secondary | ICD-10-CM | POA: Diagnosis not present

## 2022-01-21 DIAGNOSIS — R2681 Unsteadiness on feet: Secondary | ICD-10-CM | POA: Diagnosis not present

## 2022-01-21 DIAGNOSIS — M25551 Pain in right hip: Secondary | ICD-10-CM | POA: Diagnosis not present

## 2022-01-25 ENCOUNTER — Ambulatory Visit (INDEPENDENT_AMBULATORY_CARE_PROVIDER_SITE_OTHER): Payer: Medicare Other | Admitting: Orthopaedic Surgery

## 2022-01-25 ENCOUNTER — Other Ambulatory Visit: Payer: Self-pay

## 2022-01-25 ENCOUNTER — Encounter: Payer: Self-pay | Admitting: Orthopaedic Surgery

## 2022-01-25 DIAGNOSIS — M5441 Lumbago with sciatica, right side: Secondary | ICD-10-CM | POA: Diagnosis not present

## 2022-01-25 DIAGNOSIS — M25551 Pain in right hip: Secondary | ICD-10-CM | POA: Diagnosis not present

## 2022-01-25 DIAGNOSIS — R2681 Unsteadiness on feet: Secondary | ICD-10-CM | POA: Diagnosis not present

## 2022-01-25 DIAGNOSIS — G8929 Other chronic pain: Secondary | ICD-10-CM

## 2022-01-25 DIAGNOSIS — M545 Low back pain, unspecified: Secondary | ICD-10-CM | POA: Diagnosis not present

## 2022-01-25 NOTE — Progress Notes (Signed)
The patient does come in today to go over an MRI of her lumbar spine.  She is an active 76 year old female has been going through physical therapy for her back.  Most of her pain is across the low back but does radiate into the sciatic area on both sides but does not go down her legs.  The MRIs reviewed with her and it does show severe stenosis centrally at L3-L4 and L4-L5.  There is more severe foraminal stenosis to the left than the right but she certainly has symptoms on both sides.  She feels like some of it may be more muscle and tendon in nature.  On exam both hips move smoothly and fluidly.  There is minimal pain over the trochanteric area on the right side but I still provided a steroid injection around the point of maximal tenderness there.  I would like to send her to Dr. Ernestina Patches to consider bilateral L4-L5 ESI's.  She may end up needing other types of injections by Dr. Ernestina Patches but this will be a start try to get her feeling better.  She will continue physical therapy as well.

## 2022-01-27 DIAGNOSIS — R2681 Unsteadiness on feet: Secondary | ICD-10-CM | POA: Diagnosis not present

## 2022-01-27 DIAGNOSIS — M545 Low back pain, unspecified: Secondary | ICD-10-CM | POA: Diagnosis not present

## 2022-01-27 DIAGNOSIS — M25551 Pain in right hip: Secondary | ICD-10-CM | POA: Diagnosis not present

## 2022-02-01 DIAGNOSIS — I129 Hypertensive chronic kidney disease with stage 1 through stage 4 chronic kidney disease, or unspecified chronic kidney disease: Secondary | ICD-10-CM | POA: Diagnosis not present

## 2022-02-01 DIAGNOSIS — M1990 Unspecified osteoarthritis, unspecified site: Secondary | ICD-10-CM | POA: Diagnosis not present

## 2022-02-01 DIAGNOSIS — R32 Unspecified urinary incontinence: Secondary | ICD-10-CM | POA: Diagnosis not present

## 2022-02-01 DIAGNOSIS — N1831 Chronic kidney disease, stage 3a: Secondary | ICD-10-CM | POA: Diagnosis not present

## 2022-02-01 DIAGNOSIS — E669 Obesity, unspecified: Secondary | ICD-10-CM | POA: Diagnosis not present

## 2022-02-01 DIAGNOSIS — E039 Hypothyroidism, unspecified: Secondary | ICD-10-CM | POA: Diagnosis not present

## 2022-02-01 DIAGNOSIS — M48062 Spinal stenosis, lumbar region with neurogenic claudication: Secondary | ICD-10-CM | POA: Diagnosis not present

## 2022-02-01 DIAGNOSIS — E785 Hyperlipidemia, unspecified: Secondary | ICD-10-CM | POA: Diagnosis not present

## 2022-02-02 DIAGNOSIS — M25551 Pain in right hip: Secondary | ICD-10-CM | POA: Diagnosis not present

## 2022-02-02 DIAGNOSIS — R2681 Unsteadiness on feet: Secondary | ICD-10-CM | POA: Diagnosis not present

## 2022-02-02 DIAGNOSIS — M545 Low back pain, unspecified: Secondary | ICD-10-CM | POA: Diagnosis not present

## 2022-02-04 DIAGNOSIS — R2681 Unsteadiness on feet: Secondary | ICD-10-CM | POA: Diagnosis not present

## 2022-02-04 DIAGNOSIS — M545 Low back pain, unspecified: Secondary | ICD-10-CM | POA: Diagnosis not present

## 2022-02-04 DIAGNOSIS — M25551 Pain in right hip: Secondary | ICD-10-CM | POA: Diagnosis not present

## 2022-02-09 DIAGNOSIS — M545 Low back pain, unspecified: Secondary | ICD-10-CM | POA: Diagnosis not present

## 2022-02-09 DIAGNOSIS — R2681 Unsteadiness on feet: Secondary | ICD-10-CM | POA: Diagnosis not present

## 2022-02-09 DIAGNOSIS — M25551 Pain in right hip: Secondary | ICD-10-CM | POA: Diagnosis not present

## 2022-02-11 DIAGNOSIS — M545 Low back pain, unspecified: Secondary | ICD-10-CM | POA: Diagnosis not present

## 2022-02-11 DIAGNOSIS — R2681 Unsteadiness on feet: Secondary | ICD-10-CM | POA: Diagnosis not present

## 2022-02-11 DIAGNOSIS — M25551 Pain in right hip: Secondary | ICD-10-CM | POA: Diagnosis not present

## 2022-03-03 ENCOUNTER — Encounter: Payer: Self-pay | Admitting: Physical Medicine and Rehabilitation

## 2022-03-03 ENCOUNTER — Ambulatory Visit: Payer: Self-pay

## 2022-03-03 ENCOUNTER — Ambulatory Visit (INDEPENDENT_AMBULATORY_CARE_PROVIDER_SITE_OTHER): Payer: Medicare Other | Admitting: Physical Medicine and Rehabilitation

## 2022-03-03 DIAGNOSIS — M5416 Radiculopathy, lumbar region: Secondary | ICD-10-CM

## 2022-03-03 MED ORDER — METHYLPREDNISOLONE ACETATE 80 MG/ML IJ SUSP
80.0000 mg | Freq: Once | INTRAMUSCULAR | Status: AC
  Administered 2022-03-03: 80 mg

## 2022-03-03 NOTE — Patient Instructions (Signed)

## 2022-03-03 NOTE — Progress Notes (Signed)
Pt state lower back pain that travels down to her buttocks and both hips. Pt state walking and standing makes the pain worse. Pt state she takes pain patches and over the counter pain meds to help ease her pain.  Numeric Pain Rating Scale and Functional Assessment Average Pain 5   In the last MONTH (on 0-10 scale) has pain interfered with the following?  1. General activity like being  able to carry out your everyday physical activities such as walking, climbing stairs, carrying groceries, or moving a chair?  Rating(9)   +Driver, -BT, -Dye Allergies.

## 2022-03-08 ENCOUNTER — Ambulatory Visit (INDEPENDENT_AMBULATORY_CARE_PROVIDER_SITE_OTHER): Payer: Medicare Other | Admitting: Orthopaedic Surgery

## 2022-03-08 ENCOUNTER — Encounter: Payer: Self-pay | Admitting: Orthopaedic Surgery

## 2022-03-08 DIAGNOSIS — M5416 Radiculopathy, lumbar region: Secondary | ICD-10-CM | POA: Diagnosis not present

## 2022-03-08 NOTE — Progress Notes (Signed)
The patient is following up less than a week after having bilateral epidural steroid injections by Dr. Ernestina Patches.  She says that the severe pain is gone and that is helped her quite a bit but she still having some right-sided sciatica again.  She is already been through physical therapy with [facility] physical therapy.  She is an active 76 year old female.  She does look better overall she is not having to take regular pain medications.  She gets up easily from a sitting position.  Both her hips move smoothly and fluidly.  Her pain seems to be in the sciatic area and ischium on both sides and some in the low back on both sides.  It does not radiate past these areas.  I do feel that she would benefit from potentially even massage therapist to try.  She agrees with this and will look 1 up and see whether insurance will potentially cover.  I will see her back in 6 weeks to see how she is doing overall.  No x-rays are needed.

## 2022-03-15 NOTE — Procedures (Signed)
Lumbosacral Transforaminal Epidural Steroid Injection - Sub-Pedicular Approach with Fluoroscopic Guidance  Patient: Cassidy Garcia      Date of Birth: [birth year] MRN: [id number] PCP: Burnard Bunting, MD      Visit Date: 03/03/2022   Universal Protocol:    Date/Time: 03/03/2022  Consent Given By: the patient  Position: PRONE  Additional Comments: Vital signs were monitored before and after the procedure. Patient was prepped and draped in the usual sterile fashion. The correct patient, procedure, and site was verified.   Injection Procedure Details:   Procedure diagnoses: Lumbar radiculopathy [M54.16]    Meds Administered:  Meds ordered this encounter  Medications   methylPREDNISolone acetate (DEPO-MEDROL) injection 80 mg    Laterality: Bilateral  Location/Site: L4  Needle:5.0 in., 22 ga.  Short bevel or Quincke spinal needle  Needle Placement: Transforaminal  Findings:    -Comments: Excellent flow of contrast along the nerve, nerve root and into the epidural space.  Procedure Details: After squaring off the end-plates to get a true AP view, the C-arm was positioned so that an oblique view of the foramen as noted above was visualized. The target area is just inferior to the "nose of the scotty dog" or sub pedicular. The soft tissues overlying this structure were infiltrated with 2-3 ml. of 1% Lidocaine without Epinephrine.  The spinal needle was inserted toward the target using a "trajectory" view along the fluoroscope beam.  Under AP and lateral visualization, the needle was advanced so it did not puncture dura and was located close the 6 O'Clock position of the pedical in AP tracterory. Biplanar projections were used to confirm position. Aspiration was confirmed to be negative for CSF and/or blood. A 1-2 ml. volume of Isovue-250 was injected and flow of contrast was noted at each level. Radiographs were obtained for documentation purposes.   After attaining the desired  flow of contrast documented above, a 0.5 to 1.0 ml test dose of 0.25% Marcaine was injected into each respective transforaminal space.  The patient was observed for 90 seconds post injection.  After no sensory deficits were reported, and normal lower extremity motor function was noted,   the above injectate was administered so that equal amounts of the injectate were placed at each foramen (level) into the transforaminal epidural space.   Additional Comments:  The patient tolerated the procedure well Dressing: 2 x 2 sterile gauze and Band-Aid    Post-procedure details: Patient was observed during the procedure. Post-procedure instructions were reviewed.  Patient left the clinic in stable condition.

## 2022-03-15 NOTE — Progress Notes (Signed)
LARENDA Garcia - 76 y.o. female MRN [id number]  Date of birth: 1946/03/13  Office Visit Note: Visit Date: 03/03/2022 PCP: Burnard Bunting, MD Referred by: Burnard Bunting, MD  Subjective: Chief Complaint  Patient presents with   Lower Back - Pain   Right Hip - Pain   Left Hip - Pain   HPI:  Cassidy Garcia is a 76 y.o. female who comes in today at the request of Dr. Jean Rosenthal for planned Bilateral L4-5 Lumbar Transforaminal epidural steroid injection with fluoroscopic guidance.  The patient has failed conservative care including home exercise, medications, time and activity modification.  This injection will be diagnostic and hopefully therapeutic.  Please see requesting physician notes for further details and justification.   ROS Otherwise per HPI.  Assessment & Plan: Visit Diagnoses:    ICD-10-CM   1. Lumbar radiculopathy  M54.16 XR C-ARM NO REPORT    Epidural Steroid injection    methylPREDNISolone acetate (DEPO-MEDROL) injection 80 mg      Plan: No additional findings.   Meds & Orders:  Meds ordered this encounter  Medications   methylPREDNISolone acetate (DEPO-MEDROL) injection 80 mg    Orders Placed This Encounter  Procedures   XR C-ARM NO REPORT   Epidural Steroid injection    Follow-up: Return for visit to requesting provider as needed.   Procedures: No procedures performed  Lumbosacral Transforaminal Epidural Steroid Injection - Sub-Pedicular Approach with Fluoroscopic Guidance  Patient: Cassidy Garcia      Date of Birth: 1945-10-30 MRN: [id number] PCP: Burnard Bunting, MD      Visit Date: 03/03/2022   Universal Protocol:    Date/Time: 03/03/2022  Consent Given By: the patient  Position: PRONE  Additional Comments: Vital signs were monitored before and after the procedure. Patient was prepped and draped in the usual sterile fashion. The correct patient, procedure, and site was verified.   Injection Procedure Details:   Procedure  diagnoses: Lumbar radiculopathy [M54.16]    Meds Administered:  Meds ordered this encounter  Medications   methylPREDNISolone acetate (DEPO-MEDROL) injection 80 mg    Laterality: Bilateral  Location/Site: L4  Needle:5.0 in., 22 ga.  Short bevel or Quincke spinal needle  Needle Placement: Transforaminal  Findings:    -Comments: Excellent flow of contrast along the nerve, nerve root and into the epidural space.  Procedure Details: After squaring off the end-plates to get a true AP view, the C-arm was positioned so that an oblique view of the foramen as noted above was visualized. The target area is just inferior to the "nose of the scotty dog" or sub pedicular. The soft tissues overlying this structure were infiltrated with 2-3 ml. of 1% Lidocaine without Epinephrine.  The spinal needle was inserted toward the target using a "trajectory" view along the fluoroscope beam.  Under AP and lateral visualization, the needle was advanced so it did not puncture dura and was located close the 6 O'Clock position of the pedical in AP tracterory. Biplanar projections were used to confirm position. Aspiration was confirmed to be negative for CSF and/or blood. A 1-2 ml. volume of Isovue-250 was injected and flow of contrast was noted at each level. Radiographs were obtained for documentation purposes.   After attaining the desired flow of contrast documented above, a 0.5 to 1.0 ml test dose of 0.25% Marcaine was injected into each respective transforaminal space.  The patient was observed for 90 seconds post injection.  After no sensory deficits were reported, and normal lower extremity  motor function was noted,   the above injectate was administered so that equal amounts of the injectate were placed at each foramen (level) into the transforaminal epidural space.   Additional Comments:  The patient tolerated the procedure well Dressing: 2 x 2 sterile gauze and Band-Aid    Post-procedure  details: Patient was observed during the procedure. Post-procedure instructions were reviewed.  Patient left the clinic in stable condition.    Clinical History: MRI LUMBAR SPINE WITHOUT CONTRAST   TECHNIQUE: Multiplanar, multisequence MR imaging of the lumbar spine was performed. No intravenous contrast was administered.   COMPARISON:  Radiographs December 02, 2021.   FINDINGS: Segmentation:  Standard.   Alignment: Dextroconvex scoliosis. Small anterolisthesis at L3-4 and L4-5. Small retrolisthesis at L1-2, L2-3 and L5-S1.   Vertebrae: No fracture, evidence of discitis, or bone lesion. Endplate degenerative changes with loss of disc height and Schmorl nodes throughout the lumbar spine with associated marrow edema at L3-4 and L4-5.   Conus medullaris and cauda equina: Conus extends to the L1 level. Conus and cauda equina appear normal.   Paraspinal and other soft tissues: Negative.   Disc levels:   T12-L1: Disc bulge and moderate facet degenerative changes without significant spinal canal or neural foraminal stenosis.   L1-2: Disc bulge, moderate facet degenerative changes and ligamentum flavum redundancy resulting in mild spinal canal stenosis with narrowing of the bilateral subarticular zones and mild bilateral foraminal.   L2-3: Disc bulge, moderate hypertrophic facet degenerative changes and ligamentum flavum redundancy resulting in mild spinal canal stenosis, mild right and moderate left neural foraminal narrowing.   L3-4: Disc bulge, prominent hypertrophic facet degenerative changes with bilateral joint effusion and ligamentum flavum redundancy resulting in severe spinal canal stenosis, mild right and severe left neural foraminal.   L4-5: Disc bulge, prominent hypertrophic facet degenerative changes with bilateral joint effusion and ligamentum flavum redundancy resulting in severe spinal canal stenosis, mild right and moderate left neural foraminal  narrowing.   L5-S1: Disc bulge, prominent hypertrophic facet degenerative changes with bilateral joint effusion and ligamentum flavum redundancy resulting in severe spinal canal stenosis and mild-to-moderate bilateral foraminal narrowing.   IMPRESSION: 1. 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 2. Multilevel neural foraminal narrowing severe on the left at L3-4 and moderate on the left at L2-3 and L4-5.     Electronically Signed   By: Pedro Earls M.D.   On: 01/21/2022 16:03     Objective:  VS:  HT:    WT:   BMI:     BP:   HR: bpm  TEMP: ( )  RESP:  Physical Exam Vitals and nursing note reviewed.  Constitutional:      General: She is not in acute distress.    Appearance: Normal appearance. She is not ill-appearing.  HENT:     Head: Normocephalic and atraumatic.     Right Ear: External ear normal.     Left Ear: External ear normal.  Eyes:     Extraocular Movements: Extraocular movements intact.  Cardiovascular:     Rate and Rhythm: Normal rate.     Pulses: Normal pulses.  Pulmonary:     Effort: Pulmonary effort is normal. No respiratory distress.  Abdominal:     General: There is no distension.     Palpations: Abdomen is soft.  Musculoskeletal:        General: Tenderness present.     Cervical back: Neck supple.  Right lower leg: No edema.     Left lower leg: No edema.     Comments: Patient has good distal strength with no pain over the greater trochanters.  No clonus or focal weakness.  Skin:    Findings: No erythema, lesion or rash.  Neurological:     General: No focal deficit present.     Mental Status: She is alert and oriented to person, place, and time.     Sensory: No sensory deficit.     Motor: No weakness or abnormal muscle tone.     Coordination: Coordination normal.  Psychiatric:        Mood and Affect: Mood normal.        Behavior: Behavior normal.       Imaging: No results found.

## 2022-03-22 DIAGNOSIS — L722 Steatocystoma multiplex: Secondary | ICD-10-CM | POA: Diagnosis not present

## 2022-03-22 DIAGNOSIS — D1801 Hemangioma of skin and subcutaneous tissue: Secondary | ICD-10-CM | POA: Diagnosis not present

## 2022-03-22 DIAGNOSIS — Z85828 Personal history of other malignant neoplasm of skin: Secondary | ICD-10-CM | POA: Diagnosis not present

## 2022-03-22 DIAGNOSIS — D2272 Melanocytic nevi of left lower limb, including hip: Secondary | ICD-10-CM | POA: Diagnosis not present

## 2022-03-22 DIAGNOSIS — D225 Melanocytic nevi of trunk: Secondary | ICD-10-CM | POA: Diagnosis not present

## 2022-03-22 DIAGNOSIS — L821 Other seborrheic keratosis: Secondary | ICD-10-CM | POA: Diagnosis not present

## 2022-03-22 DIAGNOSIS — D485 Neoplasm of uncertain behavior of skin: Secondary | ICD-10-CM | POA: Diagnosis not present

## 2022-03-22 DIAGNOSIS — D2271 Melanocytic nevi of right lower limb, including hip: Secondary | ICD-10-CM | POA: Diagnosis not present

## 2022-03-24 ENCOUNTER — Encounter: Payer: Self-pay | Admitting: Physical Medicine and Rehabilitation

## 2022-03-24 ENCOUNTER — Ambulatory Visit (INDEPENDENT_AMBULATORY_CARE_PROVIDER_SITE_OTHER): Payer: Medicare Other | Admitting: Physical Medicine and Rehabilitation

## 2022-03-24 VITALS — BP 126/80 | HR 74

## 2022-03-24 DIAGNOSIS — M47816 Spondylosis without myelopathy or radiculopathy, lumbar region: Secondary | ICD-10-CM

## 2022-03-24 DIAGNOSIS — M48062 Spinal stenosis, lumbar region with neurogenic claudication: Secondary | ICD-10-CM

## 2022-03-24 DIAGNOSIS — M4726 Other spondylosis with radiculopathy, lumbar region: Secondary | ICD-10-CM

## 2022-03-24 DIAGNOSIS — M5416 Radiculopathy, lumbar region: Secondary | ICD-10-CM

## 2022-03-24 NOTE — Progress Notes (Signed)
Cassidy Garcia - 76 y.o. female MRN [id number]  Date of birth: 1946/03/19  Office Visit Note: Visit Date: 03/24/2022 PCP: Burnard Bunting, MD Referred by: Burnard Bunting, MD  Subjective: No chief complaint on file.  HPI: Cassidy Garcia is a 76 y.o. female who comes in today for evaluation of chronic, worsening and severe bilateral lower back pain radiating to buttocks and hips. Pain ongoing for several months and is exacerbated by prolonged standing and walking. She describes pain as sore, sharp and aching sensation, currently rates as 8 out of 10. She reports some relief of pain with home exercise regimen, rest and use of medications. Patient does take Tylenol #3 and Norco intermittently as needed for moderate/severe pain. Patient reports history of physical therapy at [facility] and reports minimal relief of pain. Recent lumbar MRI imaging exhibits advanced degenerative changes of the lumbar spine with severe spinal canal stenosis at L3-L4, L4-L5 and L5-S1. Patient recently underwent bilateral L4 transforaminal epidural steroid injection in our office on 03/03/2022 and reports complete resolution of bilateral lower back pain, however bilateral buttock and hip pain remains. Patient states her severe pain is negatively impacting her daily life. Patient denies focal weakness, numbness and tingling. Patient denies recent trauma or falls.    Review of Systems  Musculoskeletal:  Positive for back pain.  Neurological:  Negative for tingling, sensory change, focal weakness and weakness.  All other systems reviewed and are negative.  Otherwise per HPI.  Assessment & Plan: Visit Diagnoses:    ICD-10-CM   1. Lumbar radiculopathy  M54.16 Ambulatory referral to Physical Medicine Rehab    2. Spinal stenosis of lumbar region with neurogenic claudication  M48.062 Ambulatory referral to Physical Medicine Rehab    3. Other spondylosis with radiculopathy, lumbar region  M47.26 Ambulatory referral to  Physical Medicine Rehab    4. Facet arthropathy, lumbar  M47.816 Ambulatory referral to Physical Medicine Rehab       Plan: Findings:  Chronic, worsening and severe bilateral lower back pain radiating to buttocks and hips. Complete resolution of bilateral lower back pain with recent lumbar epidural steroid injection, however bilateral buttock and hip pain remains. Patient continues to have severe pain despite good conservative therapies such as home exercise regimen, rest and use of medications. I did discuss recent lumbar MRI using imaging and spine model. There is severe spinal canal stenosis at the levels of L3-L4, L4-L5 and L5-S1. Next step is to perform diagnostic and hopefully therapeutic bilateral L5 transforaminal epidural steroid injection under fluoroscopic guidance. Patient encouraged to remain active as tolerated. No red flag symptoms noted upon exam today.     Meds & Orders: No orders of the defined types were placed in this encounter.   Orders Placed This Encounter  Procedures   Ambulatory referral to Physical Medicine Rehab    Follow-up: Return for Bilateral L5 transforaminal epidural steroid injection.   Procedures: No procedures performed      Clinical History: MRI LUMBAR SPINE WITHOUT CONTRAST   TECHNIQUE: Multiplanar, multisequence MR imaging of the lumbar spine was performed. No intravenous contrast was administered.   COMPARISON:  Radiographs December 02, 2021.   FINDINGS: Segmentation:  Standard.   Alignment: Dextroconvex scoliosis. Small anterolisthesis at L3-4 and L4-5. Small retrolisthesis at L1-2, L2-3 and L5-S1.   Vertebrae: No fracture, evidence of discitis, or bone lesion. Endplate degenerative changes with loss of disc height and Schmorl nodes throughout the lumbar spine with associated marrow edema at L3-4 and L4-5.  Conus medullaris and cauda equina: Conus extends to the L1 level. Conus and cauda equina appear normal.   Paraspinal and other  soft tissues: Negative.   Disc levels:   T12-L1: Disc bulge and moderate facet degenerative changes without significant spinal canal or neural foraminal stenosis.   L1-2: Disc bulge, moderate facet degenerative changes and ligamentum flavum redundancy resulting in mild spinal canal stenosis with narrowing of the bilateral subarticular zones and mild bilateral foraminal.   L2-3: Disc bulge, moderate hypertrophic facet degenerative changes and ligamentum flavum redundancy resulting in mild spinal canal stenosis, mild right and moderate left neural foraminal narrowing.   L3-4: Disc bulge, prominent hypertrophic facet degenerative changes with bilateral joint effusion and ligamentum flavum redundancy resulting in severe spinal canal stenosis, mild right and severe left neural foraminal.   L4-5: Disc bulge, prominent hypertrophic facet degenerative changes with bilateral joint effusion and ligamentum flavum redundancy resulting in severe spinal canal stenosis, mild right and moderate left neural foraminal narrowing.   L5-S1: Disc bulge, prominent hypertrophic facet degenerative changes with bilateral joint effusion and ligamentum flavum redundancy resulting in severe spinal canal stenosis and mild-to-moderate bilateral foraminal narrowing.   IMPRESSION: 1. 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 2. Multilevel neural foraminal narrowing severe on the left at L3-4 and moderate on the left at L2-3 and L4-5.     Electronically Signed   By: Pedro Earls M.D.   On: 01/21/2022 16:03   She reports that she quit smoking about 20 years ago. Her smoking use included cigarettes. She has never used smokeless tobacco.  Recent Labs    08/12/21 1124  HGBA1C 5.3    Objective:  VS:  HT:    WT:   BMI:     BP:126/80  HR:74bpm  TEMP: ( )  RESP:  Physical Exam Vitals and nursing note reviewed.  HENT:      Head: Normocephalic and atraumatic.     Right Ear: External ear normal.     Left Ear: External ear normal.     Nose: Nose normal.     Mouth/Throat:     Mouth: Mucous membranes are moist.  Eyes:     Extraocular Movements: Extraocular movements intact.  Cardiovascular:     Rate and Rhythm: Normal rate.     Pulses: Normal pulses.  Pulmonary:     Effort: Pulmonary effort is normal.  Abdominal:     General: Abdomen is flat. There is no distension.  Musculoskeletal:        General: Tenderness present.     Cervical back: Normal range of motion.     Comments: Pt rises from seated position to standing without difficulty. Good lumbar range of motion. Strong distal strength without clonus, no pain upon palpation of greater trochanters. Sensation intact bilaterally. Walks independently, gait steady.   Skin:    General: Skin is warm and dry.     Capillary Refill: Capillary refill takes less than 2 seconds.  Neurological:     General: No focal deficit present.     Mental Status: She is alert and oriented to person, place, and time.  Psychiatric:        Mood and Affect: Mood normal.        Behavior: Behavior normal.     Ortho Exam  Imaging: No results found.  Past Medical/Family/Surgical/Social History: Medications & Allergies reviewed per EMR, new medications updated. 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   Hemorrhoids 03/01/2017   Great toe pain, left 12/04/2016   Chronic right  shoulder pain 12/04/2016   Non-toxic multinodular goiter 05/14/2013   Degenerative arthritis of hip 12/10/2011   HTN (hypertension) 07/06/2011   Hyperlipidemia 07/06/2011   Gastro-esophageal reflux disease without esophagitis 09/23/2009   Past Medical History:  Diagnosis Date   Arthritis    Asthma    Bronchitis 03/2016   Chronic kidney disease    stage 3   Colon polyps    tubular adenoma   Constipation    Depression    Diverticulosis    GERD (gastroesophageal reflux disease)    Hemorrhoids    Hyperlipidemia    Hypertension    Hypothyroidism    Shortness of breath    with exertion on occasion   Sinusitis    Skin cancer    Family History  Problem Relation Age of Onset   Ovarian cancer Mother    Anxiety disorder Mother    Heart disease Paternal Grandmother    Other Paternal Aunt        brain tumor   Birth defects Sister        no iris   Depression Sister    Anxiety disorder Sister    Colon cancer Neg Hx    Past Surgical History:  Procedure Laterality Date   ABDOMINAL HYSTERECTOMY     CARPOMETACARPAL ([place]) FUSION OF THUMB Bilateral    CATARACT EXTRACTION, BILATERAL     CHOLECYSTECTOMY     COLONOSCOPY  02/12/2011   HAMMER TOE SURGERY Right    LAPAROSCOPIC APPENDECTOMY N/A 08/26/2021   Procedure: APPENDECTOMY LAPAROSCOPIC WITH LYSIS OF ADHESIONS;  Surgeon: Michael Boston, MD;  Location: [facility];  Service: General;  Laterality: N/A;   NASAL SINUS SURGERY     POLYPECTOMY     TOTAL HIP ARTHROPLASTY  12/10/2011   Procedure: TOTAL HIP ARTHROPLASTY ANTERIOR APPROACH;  Surgeon: Mcarthur Rossetti, MD;  Location: [facility];  Service: Orthopedics;  Laterality: Right;  Right Total Hip Arthroplasty, Anterior Approach   Social History   Occupational History   Occupation: retired    Fish farm manager: RETIRED  Tobacco Use   Smoking status: Former    Types: Cigarettes    Quit date: 08/16/2001    Years since quitting: 20.6   Smokeless tobacco: Never  Vaping Use   Vaping Use: Never  used  Substance and Sexual Activity   Alcohol use: Not Currently    Comment: rare   Drug use: No   Sexual activity: Not on file

## 2022-03-24 NOTE — Progress Notes (Signed)
Pt has hx of inj on 03/03/22 pt state it help her back pain but she has pain in her buttocks and hips. Pt state walking and standing makes the pain worse. Pt state she takes over the counter pain meds to help ease her pain.  Numeric Pain Rating Scale and Functional Assessment Average Pain 8 Pain Right Now 1 My pain is constant, sharp, stabbing, and aching Pain is worse with: walking, standing, and some activites Pain improves with: rest, medication, and injections   In the last MONTH (on 0-10 scale) has pain interfered with the following?  1. General activity like being  able to carry out your everyday physical activities such as walking, climbing stairs, carrying groceries, or moving a chair?  Rating(5)  2. Relation with others like being able to carry out your usual social activities and roles such as  activities at home, at work and in your community. Rating(6)  3. Enjoyment of life such that you have  been bothered by emotional problems such as feeling anxious, depressed or irritable?  Rating(7)

## 2022-04-01 ENCOUNTER — Ambulatory Visit (INDEPENDENT_AMBULATORY_CARE_PROVIDER_SITE_OTHER): Payer: Medicare Other | Admitting: Physical Medicine and Rehabilitation

## 2022-04-01 ENCOUNTER — Encounter: Payer: Self-pay | Admitting: Physical Medicine and Rehabilitation

## 2022-04-01 ENCOUNTER — Ambulatory Visit: Payer: Medicare Other

## 2022-04-01 VITALS — BP 128/82 | HR 83

## 2022-04-01 DIAGNOSIS — M5416 Radiculopathy, lumbar region: Secondary | ICD-10-CM

## 2022-04-01 DIAGNOSIS — M48062 Spinal stenosis, lumbar region with neurogenic claudication: Secondary | ICD-10-CM | POA: Diagnosis not present

## 2022-04-01 MED ORDER — METHYLPREDNISOLONE ACETATE 80 MG/ML IJ SUSP
80.0000 mg | Freq: Once | INTRAMUSCULAR | Status: AC
  Administered 2022-04-01: 80 mg

## 2022-04-01 NOTE — Progress Notes (Signed)
Pt state lower back pain that travels to her buttocks and hips. Pt state walking and standing makes the pain worse. Pt state she takes over the counter pain meds to help ease her pain.  Numeric Pain Rating Scale and Functional Assessment Average Pain 1   In the last MONTH (on 0-10 scale) has pain interfered with the following?  1. General activity like being  able to carry out your everyday physical activities such as walking, climbing stairs, carrying groceries, or moving a chair?  Rating(8)   +Driver, -BT, -Dye Allergies.

## 2022-04-01 NOTE — Patient Instructions (Signed)

## 2022-04-02 ENCOUNTER — Other Ambulatory Visit: Payer: Self-pay | Admitting: Physician Assistant

## 2022-04-13 DIAGNOSIS — N1831 Chronic kidney disease, stage 3a: Secondary | ICD-10-CM | POA: Diagnosis not present

## 2022-04-13 DIAGNOSIS — R197 Diarrhea, unspecified: Secondary | ICD-10-CM | POA: Diagnosis not present

## 2022-04-13 DIAGNOSIS — N179 Acute kidney failure, unspecified: Secondary | ICD-10-CM | POA: Diagnosis not present

## 2022-04-13 DIAGNOSIS — R5383 Other fatigue: Secondary | ICD-10-CM | POA: Diagnosis not present

## 2022-04-14 NOTE — Progress Notes (Signed)
[name] - 76 y.o. female MRN [id number]  Date of birth: [birth year]  Office Visit Note: Visit Date: 04/01/2022 PCP: Burnard Bunting, MD Referred by: Burnard Bunting, MD  Subjective: Chief Complaint  Patient presents with   Lower Back - Pain   Right Hip - Pain   Left Hip - Pain   HPI:  Cassidy Garcia is a 76 y.o. female who comes in today at the request of Barnet Pall, FNP for planned Bilateral L5-S1 Lumbar Transforaminal epidural steroid injection with fluoroscopic guidance.  The patient has failed conservative care including home exercise, medications, time and activity modification.  This injection will be diagnostic and hopefully therapeutic.  Please see requesting physician notes for further details and justification.   ROS Otherwise per HPI.  Assessment & Plan: Visit Diagnoses:    ICD-10-CM   1. Lumbar radiculopathy  M54.16 XR C-ARM NO REPORT    Epidural Steroid injection    methylPREDNISolone acetate (DEPO-MEDROL) injection 80 mg    2. Spinal stenosis of lumbar region with neurogenic claudication  M48.062 XR C-ARM NO REPORT    Epidural Steroid injection    methylPREDNISolone acetate (DEPO-MEDROL) injection 80 mg      Plan: No additional findings.   Meds & Orders:  Meds ordered this encounter  Medications   methylPREDNISolone acetate (DEPO-MEDROL) injection 80 mg    Orders Placed This Encounter  Procedures   XR C-ARM NO REPORT   Epidural Steroid injection    Follow-up: Return for visit to requesting provider as needed.   Procedures: No procedures performed  Lumbosacral Transforaminal Epidural Steroid Injection - Sub-Pedicular Approach with Fluoroscopic Guidance  Patient: Cassidy Garcia      Date of Birth: 1946-02-16 MRN: [id number] PCP: Burnard Bunting, MD      Visit Date: 04/01/2022   Universal Protocol:    Date/Time: 04/01/2022  Consent Given By: the patient  Position: PRONE  Additional Comments: Vital signs were monitored before and  after the procedure. Patient was prepped and draped in the usual sterile fashion. The correct patient, procedure, and site was verified.   Injection Procedure Details:   Procedure diagnoses: Lumbar radiculopathy [M54.16]    Meds Administered:  Meds ordered this encounter  Medications   methylPREDNISolone acetate (DEPO-MEDROL) injection 80 mg    Laterality: Bilateral  Location/Site: L5  Needle:5.0 in., 22 ga.  Short bevel or Quincke spinal needle  Needle Placement: Transforaminal  Findings:    -Comments: Excellent flow of contrast along the nerve, nerve root and into the epidural space.  Procedure Details: After squaring off the end-plates to get a true AP view, the C-arm was positioned so that an oblique view of the foramen as noted above was visualized. The target area is just inferior to the "nose of the scotty dog" or sub pedicular. The soft tissues overlying this structure were infiltrated with 2-3 ml. of 1% Lidocaine without Epinephrine.  The spinal needle was inserted toward the target using a "trajectory" view along the fluoroscope beam.  Under AP and lateral visualization, the needle was advanced so it did not puncture dura and was located close the 6 O'Clock position of the pedical in AP tracterory. Biplanar projections were used to confirm position. Aspiration was confirmed to be negative for CSF and/or blood. A 1-2 ml. volume of Isovue-250 was injected and flow of contrast was noted at each level. Radiographs were obtained for documentation purposes.   After attaining the desired flow of contrast documented above, a 0.5 to 1.0 ml  test dose of 0.25% Marcaine was injected into each respective transforaminal space.  The patient was observed for 90 seconds post injection.  After no sensory deficits were reported, and normal lower extremity motor function was noted,   the above injectate was administered so that equal amounts of the injectate were placed at each foramen (level)  into the transforaminal epidural space.   Additional Comments:  The patient tolerated the procedure well Dressing: 2 x 2 sterile gauze and Band-Aid    Post-procedure details: Patient was observed during the procedure. Post-procedure instructions were reviewed.  Patient left the clinic in stable condition.     Clinical History: MRI LUMBAR SPINE WITHOUT CONTRAST   TECHNIQUE: Multiplanar, multisequence MR imaging of the lumbar spine was performed. No intravenous contrast was administered.   COMPARISON:  Radiographs December 02, 2021.   FINDINGS: Segmentation:  Standard.   Alignment: Dextroconvex scoliosis. Small anterolisthesis at L3-4 and L4-5. Small retrolisthesis at L1-2, L2-3 and L5-S1.   Vertebrae: No fracture, evidence of discitis, or bone lesion. Endplate degenerative changes with loss of disc height and Schmorl nodes throughout the lumbar spine with associated marrow edema at L3-4 and L4-5.   Conus medullaris and cauda equina: Conus extends to the L1 level. Conus and cauda equina appear normal.   Paraspinal and other soft tissues: Negative.   Disc levels:   T12-L1: Disc bulge and moderate facet degenerative changes without significant spinal canal or neural foraminal stenosis.   L1-2: Disc bulge, moderate facet degenerative changes and ligamentum flavum redundancy resulting in mild spinal canal stenosis with narrowing of the bilateral subarticular zones and mild bilateral foraminal.   L2-3: Disc bulge, moderate hypertrophic facet degenerative changes and ligamentum flavum redundancy resulting in mild spinal canal stenosis, mild right and moderate left neural foraminal narrowing.   L3-4: Disc bulge, prominent hypertrophic facet degenerative changes with bilateral joint effusion and ligamentum flavum redundancy resulting in severe spinal canal stenosis, mild right and severe left neural foraminal.   L4-5: Disc bulge, prominent hypertrophic facet degenerative  changes with bilateral joint effusion and ligamentum flavum redundancy resulting in severe spinal canal stenosis, mild right and moderate left neural foraminal narrowing.   L5-S1: Disc bulge, prominent hypertrophic facet degenerative changes with bilateral joint effusion and ligamentum flavum redundancy resulting in severe spinal canal stenosis and mild-to-moderate bilateral foraminal narrowing.   IMPRESSION: 1. 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 2. Multilevel neural foraminal narrowing severe on the left at L3-4 and moderate on the left at L2-3 and L4-5.     Electronically Signed   By: Pedro Earls M.D.   On: 01/21/2022 16:03     Objective:  VS:  HT:    WT:   BMI:     BP:128/82  HR:83bpm  TEMP: ( )  RESP:  Physical Exam Vitals and nursing note reviewed.  Constitutional:      General: She is not in acute distress.    Appearance: Normal appearance. She is not ill-appearing.  HENT:     Head: Normocephalic and atraumatic.     Right Ear: External ear normal.     Left Ear: External ear normal.  Eyes:     Extraocular Movements: Extraocular movements intact.  Cardiovascular:     Rate and Rhythm: Normal rate.     Pulses: Normal pulses.  Pulmonary:     Effort: Pulmonary effort is normal. No respiratory distress.  Abdominal:     General: There  is no distension.     Palpations: Abdomen is soft.  Musculoskeletal:        General: Tenderness present.     Cervical back: Neck supple.     Right lower leg: No edema.     Left lower leg: No edema.     Comments: Patient has good distal strength with no pain over the greater trochanters.  No clonus or focal weakness.  Skin:    Findings: No erythema, lesion or rash.  Neurological:     General: No focal deficit present.     Mental Status: She is alert and oriented to person, place, and time.     Sensory: No sensory deficit.     Motor: No weakness  or abnormal muscle tone.     Coordination: Coordination normal.  Psychiatric:        Mood and Affect: Mood normal.        Behavior: Behavior normal.      Imaging: No results found.

## 2022-04-14 NOTE — Procedures (Signed)
Lumbosacral Transforaminal Epidural Steroid Injection - Sub-Pedicular Approach with Fluoroscopic Guidance  Patient: Cassidy Garcia      Date of Birth: 08/21/1945 MRN: [id number] PCP: Burnard Bunting, MD      Visit Date: 04/01/2022   Universal Protocol:    Date/Time: 04/01/2022  Consent Given By: the patient  Position: PRONE  Additional Comments: Vital signs were monitored before and after the procedure. Patient was prepped and draped in the usual sterile fashion. The correct patient, procedure, and site was verified.   Injection Procedure Details:   Procedure diagnoses: Lumbar radiculopathy [M54.16]    Meds Administered:  Meds ordered this encounter  Medications   methylPREDNISolone acetate (DEPO-MEDROL) injection 80 mg    Laterality: Bilateral  Location/Site: L5  Needle:5.0 in., 22 ga.  Short bevel or Quincke spinal needle  Needle Placement: Transforaminal  Findings:    -Comments: Excellent flow of contrast along the nerve, nerve root and into the epidural space.  Procedure Details: After squaring off the end-plates to get a true AP view, the C-arm was positioned so that an oblique view of the foramen as noted above was visualized. The target area is just inferior to the "nose of the scotty dog" or sub pedicular. The soft tissues overlying this structure were infiltrated with 2-3 ml. of 1% Lidocaine without Epinephrine.  The spinal needle was inserted toward the target using a "trajectory" view along the fluoroscope beam.  Under AP and lateral visualization, the needle was advanced so it did not puncture dura and was located close the 6 O'Clock position of the pedical in AP tracterory. Biplanar projections were used to confirm position. Aspiration was confirmed to be negative for CSF and/or blood. A 1-2 ml. volume of Isovue-250 was injected and flow of contrast was noted at each level. Radiographs were obtained for documentation purposes.   After attaining the desired  flow of contrast documented above, a 0.5 to 1.0 ml test dose of 0.25% Marcaine was injected into each respective transforaminal space.  The patient was observed for 90 seconds post injection.  After no sensory deficits were reported, and normal lower extremity motor function was noted,   the above injectate was administered so that equal amounts of the injectate were placed at each foramen (level) into the transforaminal epidural space.   Additional Comments:  The patient tolerated the procedure well Dressing: 2 x 2 sterile gauze and Band-Aid    Post-procedure details: Patient was observed during the procedure. Post-procedure instructions were reviewed.  Patient left the clinic in stable condition.

## 2022-04-16 DIAGNOSIS — N1831 Chronic kidney disease, stage 3a: Secondary | ICD-10-CM | POA: Diagnosis not present

## 2022-04-16 DIAGNOSIS — I10 Essential (primary) hypertension: Secondary | ICD-10-CM | POA: Diagnosis not present

## 2022-04-16 DIAGNOSIS — R7989 Other specified abnormal findings of blood chemistry: Secondary | ICD-10-CM | POA: Diagnosis not present

## 2022-04-21 ENCOUNTER — Ambulatory Visit: Payer: Medicare Other | Admitting: Orthopaedic Surgery

## 2022-04-29 DIAGNOSIS — N189 Chronic kidney disease, unspecified: Secondary | ICD-10-CM | POA: Diagnosis not present

## 2022-04-29 DIAGNOSIS — N2581 Secondary hyperparathyroidism of renal origin: Secondary | ICD-10-CM | POA: Diagnosis not present

## 2022-04-29 DIAGNOSIS — N390 Urinary tract infection, site not specified: Secondary | ICD-10-CM | POA: Diagnosis not present

## 2022-04-29 DIAGNOSIS — N1832 Chronic kidney disease, stage 3b: Secondary | ICD-10-CM | POA: Diagnosis not present

## 2022-04-29 DIAGNOSIS — D631 Anemia in chronic kidney disease: Secondary | ICD-10-CM | POA: Diagnosis not present

## 2022-04-29 DIAGNOSIS — I129 Hypertensive chronic kidney disease with stage 1 through stage 4 chronic kidney disease, or unspecified chronic kidney disease: Secondary | ICD-10-CM | POA: Diagnosis not present

## 2022-05-04 ENCOUNTER — Ambulatory Visit (INDEPENDENT_AMBULATORY_CARE_PROVIDER_SITE_OTHER): Payer: Medicare Other | Admitting: Nurse Practitioner

## 2022-05-04 ENCOUNTER — Other Ambulatory Visit: Payer: Medicare Other

## 2022-05-04 ENCOUNTER — Encounter: Payer: Self-pay | Admitting: Nurse Practitioner

## 2022-05-04 VITALS — BP 130/68 | HR 74 | Ht 64.0 in | Wt 200.0 lb

## 2022-05-04 DIAGNOSIS — R143 Flatulence: Secondary | ICD-10-CM | POA: Diagnosis not present

## 2022-05-04 DIAGNOSIS — R197 Diarrhea, unspecified: Secondary | ICD-10-CM | POA: Diagnosis not present

## 2022-05-04 DIAGNOSIS — R194 Change in bowel habit: Secondary | ICD-10-CM

## 2022-05-04 NOTE — Progress Notes (Signed)
Chief Complaint:  flatulence, bowel changes   Assessment &  Plan   # 76 yo female with one month history of bowel changes. Started out with increased flatulence and flagrant diarrhea. No immediate improvement after a week of Flagyl but now some improvement in frequency, consistency and urgency of bowel movements. Also less flatulence. Sounds like she had / has infectious process. Non-toxic appearing, non-tender abdomen. Normal diagnostic colonoscopy in May 2023, done for rectal bleeding  Stool for C-diff, GI pathogen and lactoferrin Okay to continue BID Lomotil. Cautioned about potential to constipate so use with caution. Continue to stay hydrated Will contact her with stool study results and to get a condition update in a few days   HPI   Cassidy Garcia is a 76 y.o. female known to Dr. Henrene Pastor with a past medical history significant for arthritis, depression, asthma, hypertension, HLD, CKD stage III, chronic lower back pain, GERD, colon polyps, hysterectomy, cholecystectomy, appendectomy with partial cecectomy. See [place] /PSH for additional history   Patient was last seen in the office April 2023 by Cottie Banda, PA for evaluation of rectal bleeding.  Subsequently had a colonoscopy with findings of small hemorrhoids.   Interval History:  Patient worked in today for bowel changes and gas. A month ago she developed increased flatulence and urgent, non-bloody diarrhea. She was having diarrhea about every hours. Dr. Reynaldo Minium gave her a course of Flagyl .  She took it for a week without any improvement so she stopped it.    No medication changes preceding diarrhea except two steroid injections in her back.  She has been on Mg+ for cramps for about a year but it has never caused diarrhea. She has been taking fiber for a year or more. She had antibiotics several months ago ( ? March) for sinus infection. No dietary changes. She doesn't use artifical sweeteners.  No out of country travel.   She is  staying hydrated and taking lomotil BID. Overall she is improving. The flatulence has decreased. Stools still not formed but no longer watery and urgent ( more mushy). Averaging 3 loose Bms a day on bid lomotil.    Her weight is stable.   She had a bout of similar symptoms in 2021 and ended up hospitalized with AKi. This time she is trying hard to stay hydrated. In between these episodes her bowel movements have been normal.   Labs:     Latest Ref Rng & Units 12/03/2021   11:37 AM 08/12/2021   11:24 AM 10/13/2020   12:00 AM  CBC  WBC 4.0 - 10.5 K/uL 7.3  6.1  8.9      Hemoglobin 12.0 - 15.0 g/dL 12.6  11.8  11.4      Hematocrit 36.0 - 46.0 % 37.4  36.1  34      Platelets 150.0 - 400.0 K/uL 201.0  186       This result is from an external source.       Latest Ref Rng & Units 12/03/2021   11:37 AM 03/19/2021   12:52 PM 12/04/2020    3:10 PM  Hepatic Function  Total Protein 6.0 - 8.3 g/dL 6.2  6.5  6.4   Albumin 3.5 - 5.2 g/dL 4.4  4.2  3.8   AST 0 - 37 U/L '28  19  26   ' ALT 0 - 35 U/L 47  27  36   Alk Phosphatase 39 - 117 U/L 108  133  303  Total Bilirubin 0.2 - 1.2 mg/dL 0.7  0.6  0.6   Bilirubin, Direct 0.0 - 0.3 mg/dL  0.1  0.1      Past Medical History:  Diagnosis Date   Arthritis    Asthma    Bronchitis 03/2016   Chronic kidney disease    stage 3   Colon polyps    tubular adenoma   Constipation    Depression    Diverticulosis    GERD (gastroesophageal reflux disease)    Hemorrhoids    Hyperlipidemia    Hypertension    Hypothyroidism    Shortness of breath    with exertion on occasion   Sinusitis    Skin cancer     Past Surgical History:  Procedure Laterality Date   ABDOMINAL HYSTERECTOMY     CARPOMETACARPAL ([place]) FUSION OF THUMB Bilateral    CATARACT EXTRACTION, BILATERAL     CHOLECYSTECTOMY     COLONOSCOPY  02/12/2011   HAMMER TOE SURGERY Right    LAPAROSCOPIC APPENDECTOMY N/A 08/26/2021   Procedure: APPENDECTOMY LAPAROSCOPIC WITH LYSIS OF ADHESIONS;   Surgeon: Michael Boston, MD;  Location: [facility];  Service: General;  Laterality: N/A;   NASAL SINUS SURGERY     POLYPECTOMY     TOTAL HIP ARTHROPLASTY  12/10/2011   Procedure: TOTAL HIP ARTHROPLASTY ANTERIOR APPROACH;  Surgeon: Mcarthur Rossetti, MD;  Location: [facility];  Service: Orthopedics;  Laterality: Right;  Right Total Hip Arthroplasty, Anterior Approach    Current Medications, Allergies, Family History and Social History were reviewed in [employer] record.     Current Outpatient Medications  Medication Sig Dispense Refill   acetaminophen-codeine (TYLENOL #3) 300-30 MG tablet Take 1 tablet by mouth every 4 (four) hours as needed for moderate pain. 20 tablet 0   acetaminophen-codeine (TYLENOL #3) 300-30 MG tablet TAKE 1 TABLET BY MOUTH EVERY 6 HOURS AS NEEDED FOR MODERATE PAIN 30 tablet 0   albuterol (VENTOLIN HFA) 108 (90 Base) MCG/ACT inhaler Inhale 2 puffs into the lungs every 6 (six) hours as needed for wheezing or shortness of breath.     amitriptyline (ELAVIL) 25 MG tablet Take 75 mg by mouth at bedtime.      aspirin 81 MG tablet Take 81 mg by mouth daily with breakfast.     atorvastatin (LIPITOR) 40 MG tablet Take 40 mg by mouth daily.     Cholecalciferol (VITAMIN D3) 25 MCG (1000 UT) CAPS 1,000 Units daily.     docusate sodium (COLACE) 250 MG capsule Take 250 mg by mouth daily.     famotidine (PEPCID) 20 MG tablet Take 20 mg by mouth daily as needed for heartburn or indigestion.     HYDROcodone-acetaminophen (NORCO/VICODIN) 5-325 MG tablet Take 1 tablet by mouth every 6 (six) hours as needed. 20 tablet 0   hydrocortisone (ANUSOL-HC) 2.5 % rectal cream Place 1 application. rectally 2 (two) times daily. 30 g 1   levothyroxine (SYNTHROID) 25 MCG tablet Take 1 tablet (25 mcg total) by mouth daily before breakfast. 30 tablet 0   lidocaine (LIDODERM) 5 % Place 1 patch onto the skin 3 (three) times a week. Remove & Discard patch within 12 hours or as directed by  MD     losartan-hydrochlorothiazide (HYZAAR) 100-25 MG tablet Take 1 tablet by mouth daily.     Magnesium 200 MG CHEW 200 mg See admin instructions.     methocarbamol (ROBAXIN) 500 MG tablet Take 1 tablet (500 mg total) by mouth at bedtime. [place]  tablet 1   Multiple Vitamin (MULTIVITAMIN) tablet Take 1 tablet by mouth daily.     omeprazole (PRILOSEC) 20 MG capsule Take 20 mg by mouth daily with breakfast.     oxymetazoline (AFRIN) 0.05 % nasal spray Place 1 spray into both nostrils daily as needed for congestion.     Potassium 99 MG TABS 99 mg.     Psyllium (METAMUCIL) 28.3 % POWD      verapamil (CALAN-SR) 240 MG CR tablet Take 240 mg by mouth at bedtime.     No current facility-administered medications for this visit.    Review of Systems: No chest pain. No shortness of breath. No urinary complaints.    Physical Exam  Wt Readings from Last 3 Encounters:  12/17/21 205 lb (93 kg)  12/03/21 205 lb (93 kg)  08/26/21 201 lb 8 oz (91.4 kg)    BP 130/68   Pulse 74   Ht '5\' 4"'  (1.626 m)   Wt 200 lb (90.7 kg)   BMI 34.33 kg/m  Constitutional:  Generally well appearing female in no acute distress. Psychiatric: Pleasant. Normal mood and affect. Behavior is normal. EENT: Pupils normal.  Conjunctivae are normal. No scleral icterus. Neck supple.  Cardiovascular: Normal rate, regular rhythm. No edema Pulmonary/chest: Effort normal and breath sounds normal. No wheezing, rales or rhonchi. Abdominal: Soft, nondistended, nontender. Bowel sounds active throughout. There are no masses palpable. No hepatomegaly. Neurological: Alert and oriented to person place and time. Skin: Skin is warm and dry. No rashes noted.  Tye Savoy, NP  05/04/2022, 1:22 PM

## 2022-05-04 NOTE — Patient Instructions (Addendum)
_______________________________________________________  If you are age 77 or older, your body mass index should be between 23-30. Your Body mass index is 34.33 kg/m. If this is out of the aforementioned range listed, please consider follow up with your Primary Care Provider.  If you are age 96 or younger, your body mass index should be between 19-25. Your Body mass index is 34.33 kg/m. If this is out of the aformentioned range listed, please consider follow up with your Primary Care Provider.   ________________________________________________________  The [facility] providers would like to encourage you to use [facility] to communicate with providers for non-urgent requests or questions.  Due to long hold times on the telephone, sending your provider a message by [facility] may be a faster and more efficient way to get a response.  Please allow 48 business hours for a response.  Please remember that this is for non-urgent requests.  _______________________________________________________  Your provider has requested that you go to the basement level for lab work before leaving today. Press "B" on the elevator. The lab is located at the first door on the left as you exit the elevator.  Due to recent changes in healthcare laws, you may see the results of your imaging and laboratory studies on MyChart before your provider has had a chance to review them.  We understand that in some cases there may be results that are confusing or concerning to you. Not all laboratory results come back in the same time frame and the provider may be waiting for multiple results in order to interpret others.  Please give [place] 48 hours in order for your provider to thoroughly review all the results before contacting the office for clarification of your results.   Avoid diary products for now.   It was a pleasure to see you today!  Thank you for trusting me with your gastrointestinal care!

## 2022-05-06 ENCOUNTER — Other Ambulatory Visit: Payer: Medicare Other

## 2022-05-06 DIAGNOSIS — R143 Flatulence: Secondary | ICD-10-CM

## 2022-05-06 DIAGNOSIS — R194 Change in bowel habit: Secondary | ICD-10-CM

## 2022-05-06 DIAGNOSIS — R197 Diarrhea, unspecified: Secondary | ICD-10-CM

## 2022-05-06 NOTE — Progress Notes (Signed)
Assessment and plans reviewed  

## 2022-05-07 ENCOUNTER — Telehealth: Payer: Self-pay

## 2022-05-07 ENCOUNTER — Other Ambulatory Visit: Payer: Self-pay

## 2022-05-07 LAB — FECAL LACTOFERRIN, QUANT
Fecal Lactoferrin: POSITIVE — AB
MICRO NUMBER:: 13950137
SPECIMEN QUALITY:: ADEQUATE

## 2022-05-07 LAB — CLOSTRIDIUM DIFFICILE TOXIN B, QUALITATIVE, REAL-TIME PCR: Toxigenic C. Difficile by PCR: DETECTED — AB

## 2022-05-07 MED ORDER — FIDAXOMICIN 200 MG PO TABS
200.0000 mg | ORAL_TABLET | Freq: Two times a day (BID) | ORAL | 0 refills | Status: DC
  Filled 2022-05-07: qty 20, 10d supply, fill #0

## 2022-05-07 NOTE — Telephone Encounter (Signed)
Called pt to let her know results and recommendations. Fidaxomicin sent to [facility] outpatient pharmacy. Pt verbalized understanding and had no other concerns at end of call.

## 2022-05-07 NOTE — Telephone Encounter (Signed)
Received call from [place] stating that patient's C-diff test result was positive. Dr. Lorenso Courier as DOD please advise.

## 2022-05-08 ENCOUNTER — Other Ambulatory Visit (HOSPITAL_COMMUNITY): Payer: Self-pay

## 2022-05-09 LAB — GI PROFILE, STOOL, PCR

## 2022-05-10 ENCOUNTER — Ambulatory Visit (INDEPENDENT_AMBULATORY_CARE_PROVIDER_SITE_OTHER): Payer: Medicare Other | Admitting: Orthopaedic Surgery

## 2022-05-10 ENCOUNTER — Encounter: Payer: Self-pay | Admitting: Orthopaedic Surgery

## 2022-05-10 DIAGNOSIS — M48062 Spinal stenosis, lumbar region with neurogenic claudication: Secondary | ICD-10-CM | POA: Diagnosis not present

## 2022-05-10 DIAGNOSIS — M5416 Radiculopathy, lumbar region: Secondary | ICD-10-CM

## 2022-05-10 NOTE — Progress Notes (Signed)
The patient is about 5 to 6 weeks out from bilateral L5 injections by Dr. Ernestina Patches.  This was on August 17.  Earlier in the summer she had other injections by him.  She says this combination of injections have helped her greatly combined with physical therapy.  She did a lot of steps yesterday.  She is 76 years old and says right now things are maintaining well for her.  Both her hips move smoothly and fluidly.  Her pain seems to be in the lower aspect of the lumbar spine and radiates more right than left but nothing goes down either leg.  She is not needing to walk with any assistive device either and her legs seem strong.  From my standpoint follow-up is as needed.  If she ends up needing repeat injections she can even try to contact Dr. Ernestina Patches section first since she has done well with the ones that he is providing for her.

## 2022-05-13 ENCOUNTER — Telehealth: Payer: Self-pay | Admitting: Nurse Practitioner

## 2022-05-13 NOTE — Telephone Encounter (Signed)
Pt states she is halfway through with [facility], symptoms have resolved, and wanted to know if she could go to dentist appointment today. Let pt know she can go to appointment and reminded pt to wash hands with soap and water after using the restroom. Pt verbalized understanding and had no other concerns at end of call.

## 2022-05-13 NOTE — Telephone Encounter (Signed)
Inbound call from patient stating she has a dentist appt today at 2:00pm and would like to know if she is able to go to that appt since she has c-diff. Please advise.

## 2022-05-20 DIAGNOSIS — Z23 Encounter for immunization: Secondary | ICD-10-CM | POA: Diagnosis not present

## 2022-05-28 DIAGNOSIS — I129 Hypertensive chronic kidney disease with stage 1 through stage 4 chronic kidney disease, or unspecified chronic kidney disease: Secondary | ICD-10-CM | POA: Diagnosis not present

## 2022-05-28 DIAGNOSIS — D631 Anemia in chronic kidney disease: Secondary | ICD-10-CM | POA: Diagnosis not present

## 2022-05-28 DIAGNOSIS — N2581 Secondary hyperparathyroidism of renal origin: Secondary | ICD-10-CM | POA: Diagnosis not present

## 2022-05-28 DIAGNOSIS — N1832 Chronic kidney disease, stage 3b: Secondary | ICD-10-CM | POA: Diagnosis not present

## 2022-06-08 DIAGNOSIS — Z23 Encounter for immunization: Secondary | ICD-10-CM | POA: Diagnosis not present

## 2022-06-18 ENCOUNTER — Telehealth: Payer: Self-pay | Admitting: Physical Medicine and Rehabilitation

## 2022-06-18 NOTE — Telephone Encounter (Signed)
Pt called requesting a call back to set an back injection appt. Please call pt at [phone].

## 2022-06-21 NOTE — Telephone Encounter (Signed)
Spoke with patient and scheduled injection for 06/28/22

## 2022-06-22 ENCOUNTER — Other Ambulatory Visit: Payer: Self-pay

## 2022-06-22 DIAGNOSIS — M5416 Radiculopathy, lumbar region: Secondary | ICD-10-CM

## 2022-06-23 ENCOUNTER — Telehealth: Payer: Self-pay | Admitting: Physical Medicine and Rehabilitation

## 2022-06-23 NOTE — Telephone Encounter (Signed)
Spoke with patient and rescheduled for 07/01/22

## 2022-06-23 NOTE — Telephone Encounter (Signed)
Patient called needing to reschedule her appointment. The number to contact patient is [phone]

## 2022-06-28 ENCOUNTER — Encounter: Payer: Medicare Other | Admitting: Physical Medicine and Rehabilitation

## 2022-07-01 ENCOUNTER — Ambulatory Visit: Payer: Medicare Other

## 2022-07-01 ENCOUNTER — Ambulatory Visit (INDEPENDENT_AMBULATORY_CARE_PROVIDER_SITE_OTHER): Payer: Medicare Other | Admitting: Physical Medicine and Rehabilitation

## 2022-07-01 DIAGNOSIS — M5416 Radiculopathy, lumbar region: Secondary | ICD-10-CM | POA: Diagnosis not present

## 2022-07-01 MED ORDER — METHYLPREDNISOLONE ACETATE 80 MG/ML IJ SUSP
80.0000 mg | Freq: Once | INTRAMUSCULAR | Status: AC
  Administered 2022-07-01: 80 mg

## 2022-07-01 NOTE — Progress Notes (Signed)
  Numeric Pain Rating Scale and Functional Assessment Average Pain 5   In the last MONTH (on 0-10 scale) has pain interfered with the following?  1. General activity like being  able to carry out your everyday physical activities such as walking, climbing stairs, carrying groceries, or moving a chair?  Rating(8)   +Driver, -BT, -Dye Allergies.  

## 2022-07-01 NOTE — Patient Instructions (Signed)

## 2022-07-02 ENCOUNTER — Ambulatory Visit (INDEPENDENT_AMBULATORY_CARE_PROVIDER_SITE_OTHER): Payer: Medicare Other | Admitting: Physician Assistant

## 2022-07-02 ENCOUNTER — Encounter: Payer: Self-pay | Admitting: Physician Assistant

## 2022-07-02 ENCOUNTER — Ambulatory Visit: Payer: Medicare Other

## 2022-07-02 DIAGNOSIS — M7581 Other shoulder lesions, right shoulder: Secondary | ICD-10-CM | POA: Diagnosis not present

## 2022-07-02 DIAGNOSIS — M25511 Pain in right shoulder: Secondary | ICD-10-CM | POA: Diagnosis not present

## 2022-07-02 DIAGNOSIS — G8929 Other chronic pain: Secondary | ICD-10-CM

## 2022-07-02 NOTE — Progress Notes (Signed)
Office Visit Note   Patient: Cassidy Garcia           Date of Birth: 1946/04/12           MRN: [id number] Visit Date: 07/02/2022              Requested by: Burnard Bunting, MD [address] [place],  [place] [address] PCP: Burnard Bunting, MD  Chief Complaint  Patient presents with   Right Shoulder - Pain      HPI: Cassidy Garcia is a pleasant 76 year old woman with a 48-monthhistory right shoulder pain.  She denies any particular injury and believes she has had trouble with the shoulder in the past.  Is somewhat limited with kidney disease and cannot really take anti-inflammatories.  She finds it particularly uncomfortable when she is trying to sleep at night.  Assessment & Plan: Visit Diagnoses:  1. Chronic right shoulder pain   2. Rotator cuff tendonitis, right     Plan: Exam consistent with rotator cuff tendinitis.  She also has some cystic changes in her humeral head.  We talked about options.  She has had steroid injections into her back and would be fine going forward with 1 in her shoulder today.  She also would like to meet with physical therapy and I have given her some self-guided exercises as well for rotator cuff stretching and strengthening  Follow-Up Instructions: Return if symptoms worsen or fail to improve.   Ortho Exam  Patient is alert, oriented, no adenopathy, well-dressed, normal affect, normal respiratory effort. Right shoulder no redness no erythema no swelling.  She has some pain with forward elevation but is able to hold her arm above her head.  Some discomfort with internal rotation behind her back.  She has excellent strength with resisted abduction external and internal rotation.  She has a positive empty can test negative speeds test.  No pain with range of motion of her neck no radicular findings she is neurovascular intact  Imaging: XR C-ARM NO REPORT  Result Date: 07/01/2022 Please see Notes tab for imaging impression.  No images are attached to  the encounter.  Labs: Lab Results  Component Value Date   HGBA1C 5.3 08/12/2021   ESRSEDRATE 60 10/13/2020   REPTSTATUS 01/25/2020 FINAL 01/24/2020   CULT  01/24/2020    NO GROWTH Performed at [facility] [id number], [place] [address]     Lab Results  Component Value Date   ALBUMIN 4.4 12/03/2021   ALBUMIN 4.2 03/19/2021   ALBUMIN 3.8 12/04/2020    Lab Results  Component Value Date   MG 2.0 01/27/2020   MG 1.5 (L) 01/26/2020   MG 0.7 (LL) 01/25/2020   No results found for: "VD25OH"  No results found for: "PREALBUMIN"    Latest Ref Rng & Units 12/03/2021   11:37 AM 08/12/2021   11:24 AM 10/13/2020   12:00 AM  CBC EXTENDED  WBC 4.0 - 10.5 K/uL 7.3  6.1  8.9      RBC 3.87 - 5.11 Mil/uL 3.94  3.71  3.8      Hemoglobin 12.0 - 15.0 g/dL 12.6  11.8  11.4      HCT 36.0 - 46.0 % 37.4  36.1  34      Platelets 150.0 - 400.0 K/uL 201.0  186       This result is from an external source.     There is no height or weight on file to  calculate BMI.  Orders:  Orders Placed This Encounter  Procedures   XR Shoulder Right   Ambulatory referral to Physical Therapy   No orders of the defined types were placed in this encounter.    Procedures: No procedures performed  Clinical Data: No additional findings.  ROS:  All other systems negative, except as noted in the HPI. Review of Systems  Objective: Vital Signs: There were no vitals taken for this visit.  Specialty Comments:  MRI LUMBAR SPINE WITHOUT CONTRAST   TECHNIQUE: Multiplanar, multisequence MR imaging of the lumbar spine was performed. No intravenous contrast was administered.   COMPARISON:  Radiographs December 02, 2021.   FINDINGS: Segmentation:  Standard.   Alignment: Dextroconvex scoliosis. Small anterolisthesis at L3-4 and L4-5. Small retrolisthesis at L1-2, L2-3 and L5-S1.   Vertebrae: No fracture, evidence of discitis, or bone lesion. Endplate degenerative changes with loss  of disc height and Schmorl nodes throughout the lumbar spine with associated marrow edema at L3-4 and L4-5.   Conus medullaris and cauda equina: Conus extends to the L1 level. Conus and cauda equina appear normal.   Paraspinal and other soft tissues: Negative.   Disc levels:   T12-L1: Disc bulge and moderate facet degenerative changes without significant spinal canal or neural foraminal stenosis.   L1-2: Disc bulge, moderate facet degenerative changes and ligamentum flavum redundancy resulting in mild spinal canal stenosis with narrowing of the bilateral subarticular zones and mild bilateral foraminal.   L2-3: Disc bulge, moderate hypertrophic facet degenerative changes and ligamentum flavum redundancy resulting in mild spinal canal stenosis, mild right and moderate left neural foraminal narrowing.   L3-4: Disc bulge, prominent hypertrophic facet degenerative changes with bilateral joint effusion and ligamentum flavum redundancy resulting in severe spinal canal stenosis, mild right and severe left neural foraminal.   L4-5: Disc bulge, prominent hypertrophic facet degenerative changes with bilateral joint effusion and ligamentum flavum redundancy resulting in severe spinal canal stenosis, mild right and moderate left neural foraminal narrowing.   L5-S1: Disc bulge, prominent hypertrophic facet degenerative changes with bilateral joint effusion and ligamentum flavum redundancy resulting in severe spinal canal stenosis and mild-to-moderate bilateral foraminal narrowing.   IMPRESSION: 1. 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 2. Multilevel neural foraminal narrowing severe on the left at L3-4 and moderate on the left at L2-3 and L4-5.     Electronically Signed   By: Cassidy Garcia M.D.   On: 01/21/2022 16:03  PMFS History: Patient Active Problem List   Diagnosis Date Noted   Rotator cuff  tendonitis, right 07/02/2022   Nausea 12/02/2021   Bright red rectal bleeding 09/15/2021   History of colonic polyps 07/13/2021   Gastroenteritis 09/09/2020   Anemia 03/06/2020   Hypothyroidism 02/04/2020   Depression 01/24/2020   Asthma 01/24/2020   CKD (chronic kidney disease), stage III ([place]) 01/24/2020   Acute on chronic renal failure (HCC) 01/24/2020   Constipation 12/20/2018   Pain in right hip 06/29/2017   Trochanteric bursitis, right hip 06/29/2017   History of total replacement of right hip 06/29/2017   Hemorrhoids 03/01/2017   Great toe pain, left 12/04/2016   Chronic right shoulder pain 12/04/2016   Non-toxic multinodular goiter 05/14/2013   Degenerative arthritis of hip 12/10/2011   HTN (hypertension) 07/06/2011   Hyperlipidemia 07/06/2011   Gastro-esophageal reflux disease without esophagitis 09/23/2009   Past Medical History:  Diagnosis Date   Arthritis    Asthma  Bronchitis 03/2016   Chronic kidney disease    stage 3   Colon polyps    tubular adenoma   Constipation    Depression    Diverticulosis    GERD (gastroesophageal reflux disease)    Hemorrhoids    Hyperlipidemia    Hypertension    Hypothyroidism    Shortness of breath    with exertion on occasion   Sinusitis    Skin cancer     Family History  Problem Relation Age of Onset   Ovarian cancer Mother    Anxiety disorder Mother    Heart disease Paternal Grandmother    Other Paternal 36        brain tumor   Birth defects Sister        no iris   Depression Sister    Anxiety disorder Sister    Colon cancer Neg Hx     Past Surgical History:  Procedure Laterality Date   ABDOMINAL HYSTERECTOMY     CARPOMETACARPAL ([place]) FUSION OF THUMB Bilateral    CATARACT EXTRACTION, BILATERAL     CHOLECYSTECTOMY     COLONOSCOPY  02/12/2011   HAMMER TOE SURGERY Right    LAPAROSCOPIC APPENDECTOMY N/A 08/26/2021   Procedure: APPENDECTOMY LAPAROSCOPIC WITH LYSIS OF ADHESIONS;  Surgeon: Michael Boston, MD;   Location: [facility];  Service: General;  Laterality: N/A;   NASAL SINUS SURGERY     POLYPECTOMY     TOTAL HIP ARTHROPLASTY  12/10/2011   Procedure: TOTAL HIP ARTHROPLASTY ANTERIOR APPROACH;  Surgeon: Mcarthur Rossetti, MD;  Location: [facility];  Service: Orthopedics;  Laterality: Right;  Right Total Hip Arthroplasty, Anterior Approach   Social History   Occupational History   Occupation: retired    Fish farm manager: RETIRED  Tobacco Use   Smoking status: Former    Types: Cigarettes    Quit date: 08/16/2001    Years since quitting: 20.8   Smokeless tobacco: Never  Vaping Use   Vaping Use: Never used  Substance and Sexual Activity   Alcohol use: Not Currently    Comment: rare   Drug use: No   Sexual activity: Not on file

## 2022-07-07 NOTE — Progress Notes (Signed)
Cassidy Garcia - 76 y.o. female MRN [id number]  Date of birth: April 29, 1946  Office Visit Note: Visit Date: 07/01/2022 PCP: Burnard Bunting, MD Referred by: Burnard Bunting, MD  Subjective: Chief Complaint  Patient presents with   Lower Back - Pain    Bilateral L5 transforaminal epidural steroid injection   HPI:  Cassidy Garcia is a 76 y.o. female who comes in today at the request of Dr. Jean Rosenthal and Barnet Pall, FNP for planned Bilateral L5-S1 Lumbar Transforaminal epidural steroid injection with fluoroscopic guidance.  The patient has failed conservative care including home exercise, medications, time and activity modification.  This injection will be diagnostic and hopefully therapeutic.  Please see requesting physician notes for further details and justification.   ROS Otherwise per HPI.  Assessment & Plan: Visit Diagnoses:    ICD-10-CM   1. Lumbar radiculopathy  M54.16 XR C-ARM NO REPORT    Epidural Steroid injection    methylPREDNISolone acetate (DEPO-MEDROL) injection 80 mg      Plan: No additional findings.   Meds & Orders:  Meds ordered this encounter  Medications   methylPREDNISolone acetate (DEPO-MEDROL) injection 80 mg    Orders Placed This Encounter  Procedures   XR C-ARM NO REPORT   Epidural Steroid injection    Follow-up: Return for visit to requesting provider as needed.   Procedures: No procedures performed  Lumbosacral Transforaminal Epidural Steroid Injection - Sub-Pedicular Approach with Fluoroscopic Guidance  Patient: Cassidy Garcia      Date of Birth: [birth year] MRN: [id number] PCP: Burnard Bunting, MD      Visit Date: 07/01/2022   Universal Protocol:    Date/Time: 07/01/2022  Consent Given By: the patient  Position: PRONE  Additional Comments: Vital signs were monitored before and after the procedure. Patient was prepped and draped in the usual sterile fashion. The correct patient, procedure, and site was  verified.   Injection Procedure Details:   Procedure diagnoses: Lumbar radiculopathy [M54.16]    Meds Administered:  Meds ordered this encounter  Medications   methylPREDNISolone acetate (DEPO-MEDROL) injection 80 mg    Laterality: Bilateral  Location/Site: L5  Needle:5.0 in., 22 ga.  Short bevel or Quincke spinal needle  Needle Placement: Transforaminal  Findings:    -Comments: Excellent flow of contrast along the nerve, nerve root and into the epidural space.  Procedure Details: After squaring off the end-plates to get a true AP view, the C-arm was positioned so that an oblique view of the foramen as noted above was visualized. The target area is just inferior to the "nose of the scotty dog" or sub pedicular. The soft tissues overlying this structure were infiltrated with 2-3 ml. of 1% Lidocaine without Epinephrine.  The spinal needle was inserted toward the target using a "trajectory" view along the fluoroscope beam.  Under AP and lateral visualization, the needle was advanced so it did not puncture dura and was located close the 6 O'Clock position of the pedical in AP tracterory. Biplanar projections were used to confirm position. Aspiration was confirmed to be negative for CSF and/or blood. A 1-2 ml. volume of Isovue-250 was injected and flow of contrast was noted at each level. Radiographs were obtained for documentation purposes.   After attaining the desired flow of contrast documented above, a 0.5 to 1.0 ml test dose of 0.25% Marcaine was injected into each respective transforaminal space.  The patient was observed for 90 seconds post injection.  After no sensory deficits were reported, and normal lower  extremity motor function was noted,   the above injectate was administered so that equal amounts of the injectate were placed at each foramen (level) into the transforaminal epidural space.   Additional Comments:  No complications occurred Dressing: 2 x 2 sterile gauze and  Band-Aid    Post-procedure details: Patient was observed during the procedure. Post-procedure instructions were reviewed.  Patient left the clinic in stable condition.    Clinical History: MRI LUMBAR SPINE WITHOUT CONTRAST   TECHNIQUE: Multiplanar, multisequence MR imaging of the lumbar spine was performed. No intravenous contrast was administered.   COMPARISON:  Radiographs December 02, 2021.   FINDINGS: Segmentation:  Standard.   Alignment: Dextroconvex scoliosis. Small anterolisthesis at L3-4 and L4-5. Small retrolisthesis at L1-2, L2-3 and L5-S1.   Vertebrae: No fracture, evidence of discitis, or bone lesion. Endplate degenerative changes with loss of disc height and Schmorl nodes throughout the lumbar spine with associated marrow edema at L3-4 and L4-5.   Conus medullaris and cauda equina: Conus extends to the L1 level. Conus and cauda equina appear normal.   Paraspinal and other soft tissues: Negative.   Disc levels:   T12-L1: Disc bulge and moderate facet degenerative changes without significant spinal canal or neural foraminal stenosis.   L1-2: Disc bulge, moderate facet degenerative changes and ligamentum flavum redundancy resulting in mild spinal canal stenosis with narrowing of the bilateral subarticular zones and mild bilateral foraminal.   L2-3: Disc bulge, moderate hypertrophic facet degenerative changes and ligamentum flavum redundancy resulting in mild spinal canal stenosis, mild right and moderate left neural foraminal narrowing.   L3-4: Disc bulge, prominent hypertrophic facet degenerative changes with bilateral joint effusion and ligamentum flavum redundancy resulting in severe spinal canal stenosis, mild right and severe left neural foraminal.   L4-5: Disc bulge, prominent hypertrophic facet degenerative changes with bilateral joint effusion and ligamentum flavum redundancy resulting in severe spinal canal stenosis, mild right and  moderate left neural foraminal narrowing.   L5-S1: Disc bulge, prominent hypertrophic facet degenerative changes with bilateral joint effusion and ligamentum flavum redundancy resulting in severe spinal canal stenosis and mild-to-moderate bilateral foraminal narrowing.   IMPRESSION: 1. 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 2. Multilevel neural foraminal narrowing severe on the left at L3-4 and moderate on the left at L2-3 and L4-5.     Electronically Signed   By: Pedro Earls M.D.   On: 01/21/2022 16:03     Objective:  VS:  HT:    WT:   BMI:     BP:   HR: bpm  TEMP: ( )  RESP:  Physical Exam Vitals and nursing note reviewed.  Constitutional:      General: She is not in acute distress.    Appearance: Normal appearance. She is not ill-appearing.  HENT:     Head: Normocephalic and atraumatic.     Right Ear: External ear normal.     Left Ear: External ear normal.  Eyes:     Extraocular Movements: Extraocular movements intact.  Cardiovascular:     Rate and Rhythm: Normal rate.     Pulses: Normal pulses.  Pulmonary:     Effort: Pulmonary effort is normal. No respiratory distress.  Abdominal:     General: There is no distension.     Palpations: Abdomen is soft.  Musculoskeletal:        General: Tenderness present.     Cervical back: Neck supple.  Right lower leg: No edema.     Left lower leg: No edema.     Comments: Patient has good distal strength with no pain over the greater trochanters.  No clonus or focal weakness.  Skin:    Findings: No erythema, lesion or rash.  Neurological:     General: No focal deficit present.     Mental Status: She is alert and oriented to person, place, and time.     Sensory: No sensory deficit.     Motor: No weakness or abnormal muscle tone.     Coordination: Coordination normal.  Psychiatric:        Mood and Affect: Mood normal.        Behavior:  Behavior normal.      Imaging: No results found.

## 2022-07-07 NOTE — Procedures (Signed)
Lumbosacral Transforaminal Epidural Steroid Injection - Sub-Pedicular Approach with Fluoroscopic Guidance  Patient: Cassidy Garcia      Date of Birth: [birth year] MRN: [id number] PCP: Burnard Bunting, MD      Visit Date: 07/01/2022   Universal Protocol:    Date/Time: 07/01/2022  Consent Given By: the patient  Position: PRONE  Additional Comments: Vital signs were monitored before and after the procedure. Patient was prepped and draped in the usual sterile fashion. The correct patient, procedure, and site was verified.   Injection Procedure Details:   Procedure diagnoses: Lumbar radiculopathy [M54.16]    Meds Administered:  Meds ordered this encounter  Medications   methylPREDNISolone acetate (DEPO-MEDROL) injection 80 mg    Laterality: Bilateral  Location/Site: L5  Needle:5.0 in., 22 ga.  Short bevel or Quincke spinal needle  Needle Placement: Transforaminal  Findings:    -Comments: Excellent flow of contrast along the nerve, nerve root and into the epidural space.  Procedure Details: After squaring off the end-plates to get a true AP view, the C-arm was positioned so that an oblique view of the foramen as noted above was visualized. The target area is just inferior to the "nose of the scotty dog" or sub pedicular. The soft tissues overlying this structure were infiltrated with 2-3 ml. of 1% Lidocaine without Epinephrine.  The spinal needle was inserted toward the target using a "trajectory" view along the fluoroscope beam.  Under AP and lateral visualization, the needle was advanced so it did not puncture dura and was located close the 6 O'Clock position of the pedical in AP tracterory. Biplanar projections were used to confirm position. Aspiration was confirmed to be negative for CSF and/or blood. A 1-2 ml. volume of Isovue-250 was injected and flow of contrast was noted at each level. Radiographs were obtained for documentation purposes.   After attaining the desired  flow of contrast documented above, a 0.5 to 1.0 ml test dose of 0.25% Marcaine was injected into each respective transforaminal space.  The patient was observed for 90 seconds post injection.  After no sensory deficits were reported, and normal lower extremity motor function was noted,   the above injectate was administered so that equal amounts of the injectate were placed at each foramen (level) into the transforaminal epidural space.   Additional Comments:  No complications occurred Dressing: 2 x 2 sterile gauze and Band-Aid    Post-procedure details: Patient was observed during the procedure. Post-procedure instructions were reviewed.  Patient left the clinic in stable condition.

## 2022-07-13 NOTE — Therapy (Signed)
OUTPATIENT PHYSICAL THERAPY EVALUATION   Patient Name: Cassidy Garcia MRN: [id number] DOB:[birth year], 76 y.o., female Today's Date: 07/14/2022  END OF SESSION:  PT End of Session - 07/14/22 1423     Visit Number 1    Number of Visits 20    Date for PT Re-Evaluation 09/22/22    Authorization Type Medicare and BCBS Federal    Progress Note Due on Visit 10    PT Start Time 1435    PT Stop Time 1510    PT Time Calculation (min) 35 min    Activity Tolerance Patient tolerated treatment well    Behavior During Therapy WFL for tasks assessed/performed             Past Medical History:  Diagnosis Date   Arthritis    Asthma    Bronchitis 03/2016   Chronic kidney disease    stage 3   Colon polyps    tubular adenoma   Constipation    Depression    Diverticulosis    GERD (gastroesophageal reflux disease)    Hemorrhoids    Hyperlipidemia    Hypertension    Hypothyroidism    Shortness of breath    with exertion on occasion   Sinusitis    Skin cancer    Past Surgical History:  Procedure Laterality Date   ABDOMINAL HYSTERECTOMY     CARPOMETACARPAL ([place]) FUSION OF THUMB Bilateral    CATARACT EXTRACTION, BILATERAL     CHOLECYSTECTOMY     COLONOSCOPY  02/12/2011   HAMMER TOE SURGERY Right    LAPAROSCOPIC APPENDECTOMY N/A 08/26/2021   Procedure: APPENDECTOMY LAPAROSCOPIC WITH LYSIS OF ADHESIONS;  Surgeon: Jaleah Lefevre Boston, MD;  Location: [facility];  Service: General;  Laterality: N/A;   NASAL SINUS SURGERY     POLYPECTOMY     TOTAL HIP ARTHROPLASTY  12/10/2011   Procedure: TOTAL HIP ARTHROPLASTY ANTERIOR APPROACH;  Surgeon: Mcarthur Rossetti, MD;  Location: [facility];  Service: Orthopedics;  Laterality: Right;  Right Total Hip Arthroplasty, Anterior Approach   Patient Active Problem List   Diagnosis Date Noted   Rotator cuff tendonitis, right 07/02/2022   Nausea 12/02/2021   Bright red rectal bleeding 09/15/2021   History of colonic polyps 07/13/2021   Gastroenteritis  09/09/2020   Anemia 03/06/2020   Hypothyroidism 02/04/2020   Depression 01/24/2020   Asthma 01/24/2020   CKD (chronic kidney disease), stage III ([place]) 01/24/2020   Acute on chronic renal failure (HCC) 01/24/2020   Constipation 12/20/2018   Pain in right hip 06/29/2017   Trochanteric bursitis, right hip 06/29/2017   History of total replacement of right hip 06/29/2017   Hemorrhoids 03/01/2017   Great toe pain, left 12/04/2016   Chronic right shoulder pain 12/04/2016   Non-toxic multinodular goiter 05/14/2013   Degenerative arthritis of hip 12/10/2011   HTN (hypertension) 07/06/2011   Hyperlipidemia 07/06/2011   Gastro-esophageal reflux disease without esophagitis 09/23/2009    PCP: Burnard Bunting MD  REFERRING PROVIDER: Persons, Bevely Palmer, [place]  REFERRING DIAG: [phone] (ICD-10-CM) - Chronic right shoulder pain  THERAPY DIAG:  Chronic right shoulder pain  Muscle weakness (generalized)  Rationale for Evaluation and Treatment: Rehabilitation  ONSET DATE: Sept 2023  SUBJECTIVE:  SUBJECTIVE STATEMENT: Pt reported insidious onset of symptoms approx. 2 months ago.  Pt indicated difficulty with lifting/carrying and occasional movements.  Pt indicated lying down has been troublesome as well due to pain complaints.  Pt indicated sleeping sitting up at times.   PERTINENT HISTORY: CKD, history of chronic back pain.  History of physical therapy for back/hip.   PAIN:  NPRS scale: at current at rest 0/10, 8-9/10 Pain location: Rt shoulder  (anterior, upper arm, back of shoulder) Pain description: stabbing at times,  Aggravating factors: lifting items, carrying Relieving factors: Voltaren gel, patches, tylenol  PRECAUTIONS: None  WEIGHT BEARING RESTRICTIONS: No  FALLS:  Has patient fallen in  last 6 months? No  LIVING ENVIRONMENT: Lives in: House/apartment Stairs: has stairs at home   OCCUPATION: No work  PLOF: Independent, Rt hand dominant, read, puzzles, household activity, no yard work.   PATIENT GOALS:Reduce pain, being able to lift  Next MD visit:  none listed  OBJECTIVE:   PATIENT SURVEYS:  07/14/2022 FOTO intake:  54   predicted:  62  COGNITION: 07/14/2022 Overall cognitive status: WFL     SENSATION: 07/14/2022 WFL  POSTURE: 07/14/2022 Mild scapular protraction/FHP  UPPER EXTREMITY ROM:   07/14/2022:  Against gravity end range pain Rt shoulder in flexion/abduction, WFL.No limits in supine and WFL in all directions.    ROM Right 07/14/2022 Left 07/14/2022  Shoulder flexion    Shoulder extension    Shoulder abduction    Shoulder adduction    Shoulder internal rotation    Shoulder external rotation    Elbow flexion    Elbow extension    Wrist flexion    Wrist extension    Wrist ulnar deviation    Wrist radial deviation    Wrist pronation    Wrist supination    (Blank rows = not tested)  UPPER EXTREMITY MMT:  MMT Right 07/14/2022 Left 07/14/2022  Shoulder flexion 4/5 c pain 5/5  Shoulder extension    Shoulder abduction 4/5 c pain 5/5  Shoulder adduction    Shoulder internal rotation 5/5 5/5  Shoulder external rotation 4/5 c pain 5/5  Middle trapezius    Lower trapezius    Elbow flexion    Elbow extension    Wrist flexion    Wrist extension    Wrist ulnar deviation    Wrist radial deviation    Wrist pronation    Wrist supination    Grip strength (lbs)    (Blank rows = not tested)  SHOULDER SPECIAL TESTS: 07/14/2022 (-) Rt empty can, painful arc  JOINT MOBILITY TESTING:  07/14/2022 No restriction  PALPATION:  07/14/2022 Trigger point   TODAY'S TREATMENT:                                                                                                                           DATE:07/14/2022 Therex:    HEP  instruction/performance c cues for techniques, handout provided.  Trial set performed of each for  comprehension and symptom assessment.  See below for exercise list   PATIENT EDUCATION: 07/14/2022 Education details: HEP, POC Person educated: Patient Education method: Explanation, Demonstration, Verbal cues, and Handouts Education comprehension: verbalized understanding, returned demonstration, and verbal cues required  HOME EXERCISE PROGRAM: Access Code: [email or web address] URL: [email or web address] Date: 07/14/2022 Prepared by: Scot Jun  Exercises - Seated Scapular Retraction  - 3-5 x daily - 7 x weekly - 1 sets - 10 reps - 3-5 hold - Standing Isometric Shoulder External Rotation with Doorway (Mirrored)  - 2 x daily - 7 x weekly - 1 sets - 10 reps - 5 hold - Standing Shoulder Posterior Capsule Stretch (Mirrored)  - 2 x daily - 7 x weekly - 1 sets - 3-5 reps - 15 hold - Seated 55 Gallon Barrel Hug Stretch  - 2 x daily - 7 x weekly - 1 sets - 10 reps - 5 hold  ASSESSMENT:  CLINICAL IMPRESSION: Patient is a 76 y.o. who comes to clinic with complaints of Rt shoulder pain with mobility, strength and movement coordination deficits that impair their ability to perform usual daily and recreational functional activities without increase difficulty/symptoms at this time.  Patient to benefit from skilled PT services to address impairments and limitations to improve to previous level of function without restriction secondary to condition.   OBJECTIVE IMPAIRMENTS: decreased activity tolerance, decreased coordination, decreased endurance, decreased strength, increased fascial restrictions, impaired perceived functional ability, improper body mechanics, and pain.   ACTIVITY LIMITATIONS: carrying, lifting, reach over head, and hygiene/grooming  PARTICIPATION LIMITATIONS: meal prep, cleaning, laundry, interpersonal relationship, and community activity  PERSONAL FACTORS:  no specific  factors  are affecting patient's functional outcome.   REHAB POTENTIAL: Good  CLINICAL DECISION MAKING: Stable/uncomplicated  EVALUATION COMPLEXITY: Low   GOALS: Goals reviewed with patient? Yes  SHORT TERM GOALS: (target date for Short term goals are 3 weeks 08/03/2022)  1.Patient will demonstrate independent use of home exercise program to maintain progress from in clinic treatments. Goal status: New  LONG TERM GOALS: (target dates for all long term goals are 10 weeks 09/21/2022 )   1. Patient will demonstrate/report pain at worst less than or equal to 2/10 to facilitate minimal limitation in daily activity secondary to pain symptoms. Goal status: New   2. Patient will demonstrate independent use of home exercise program to facilitate ability to maintain/progress functional gains from skilled physical therapy services. Goal status: New   3. Patient will demonstrate FOTO outcome > or = 62 % to indicate reduced disability due to condition. Goal status: New   4.  Patient will demonstrate Rt UE MMT 5/5 throughout to facilitate lifting, reaching, carrying at [facility] in daily activity.   Goal status: New   5.  Patient will demonstrate Rt GH joint AROM WFL s symptoms to facilitate usual overhead reaching, self care, dressing at PLOF.    Goal status: New   6.  Patient will demonstrate/report ability to sleep s restriction. Goal status: New   PLAN:  PT FREQUENCY: 1-2x/week  PT DURATION: 10 weeks  PLANNED INTERVENTIONS: Therapeutic exercises, Therapeutic activity, Neuro Muscular re-education, Balance training, Gait training, Patient/Family education, Joint mobilization, Stair training, DME instructions, Dry Needling, Electrical stimulation, Traction, Cryotherapy, vasopneumatic device Moist heat, Taping, Ultrasound, Ionotophoresis '4mg'$ /ml Dexamethasone, and Manual therapy.  All included unless contraindicated  PLAN FOR NEXT SESSION: Review HEP knowledge/results.  Early strengthening  for ER and scapular strength.    Scot Jun, PT, DPT, OCS, ATC 07/14/22  4:00 PM

## 2022-07-14 ENCOUNTER — Encounter: Payer: Self-pay | Admitting: Rehabilitative and Restorative Service Providers"

## 2022-07-14 ENCOUNTER — Ambulatory Visit (INDEPENDENT_AMBULATORY_CARE_PROVIDER_SITE_OTHER): Payer: Medicare Other | Admitting: Rehabilitative and Restorative Service Providers"

## 2022-07-14 ENCOUNTER — Other Ambulatory Visit: Payer: Self-pay

## 2022-07-14 DIAGNOSIS — M6281 Muscle weakness (generalized): Secondary | ICD-10-CM | POA: Diagnosis not present

## 2022-07-14 DIAGNOSIS — G8929 Other chronic pain: Secondary | ICD-10-CM | POA: Diagnosis not present

## 2022-07-14 DIAGNOSIS — M25511 Pain in right shoulder: Secondary | ICD-10-CM | POA: Diagnosis not present

## 2022-07-28 ENCOUNTER — Encounter: Payer: Self-pay | Admitting: Physical Therapy

## 2022-07-28 ENCOUNTER — Ambulatory Visit (INDEPENDENT_AMBULATORY_CARE_PROVIDER_SITE_OTHER): Payer: Medicare Other | Admitting: Physical Therapy

## 2022-07-28 DIAGNOSIS — E785 Hyperlipidemia, unspecified: Secondary | ICD-10-CM | POA: Diagnosis not present

## 2022-07-28 DIAGNOSIS — D649 Anemia, unspecified: Secondary | ICD-10-CM | POA: Diagnosis not present

## 2022-07-28 DIAGNOSIS — G8929 Other chronic pain: Secondary | ICD-10-CM | POA: Diagnosis not present

## 2022-07-28 DIAGNOSIS — M25511 Pain in right shoulder: Secondary | ICD-10-CM | POA: Diagnosis not present

## 2022-07-28 DIAGNOSIS — Z1212 Encounter for screening for malignant neoplasm of rectum: Secondary | ICD-10-CM | POA: Diagnosis not present

## 2022-07-28 DIAGNOSIS — E039 Hypothyroidism, unspecified: Secondary | ICD-10-CM | POA: Diagnosis not present

## 2022-07-28 DIAGNOSIS — I10 Essential (primary) hypertension: Secondary | ICD-10-CM | POA: Diagnosis not present

## 2022-07-28 NOTE — Therapy (Signed)
OUTPATIENT PHYSICAL THERAPY TREATMENT NOTE   Patient Name: Cassidy Garcia MRN: [id number] DOB:[birth year], 76 y.o., female Today's Date: 07/28/2022  END OF SESSION:   PT End of Session - 07/28/22 1345     Visit Number 2    Number of Visits 20    Date for PT Re-Evaluation 09/22/22    Authorization Type Medicare and [employer]    Progress Note Due on Visit 10    PT Start Time 1345    PT Stop Time 1424    PT Time Calculation (min) 39 min    Activity Tolerance Patient tolerated treatment well    Behavior During Therapy WFL for tasks assessed/performed             Past Medical History:  Diagnosis Date   Arthritis    Asthma    Bronchitis 03/2016   Chronic kidney disease    stage 3   Colon polyps    tubular adenoma   Constipation    Depression    Diverticulosis    GERD (gastroesophageal reflux disease)    Hemorrhoids    Hyperlipidemia    Hypertension    Hypothyroidism    Shortness of breath    with exertion on occasion   Sinusitis    Skin cancer    Past Surgical History:  Procedure Laterality Date   ABDOMINAL HYSTERECTOMY     CARPOMETACARPAL ([place]) FUSION OF THUMB Bilateral    CATARACT EXTRACTION, BILATERAL     CHOLECYSTECTOMY     COLONOSCOPY  02/12/2011   HAMMER TOE SURGERY Right    LAPAROSCOPIC APPENDECTOMY N/A 08/26/2021   Procedure: APPENDECTOMY LAPAROSCOPIC WITH LYSIS OF ADHESIONS;  Surgeon: Michael Boston, MD;  Location: [facility];  Service: General;  Laterality: N/A;   NASAL SINUS SURGERY     POLYPECTOMY     TOTAL HIP ARTHROPLASTY  12/10/2011   Procedure: TOTAL HIP ARTHROPLASTY ANTERIOR APPROACH;  Surgeon: Mcarthur Rossetti, MD;  Location: [facility];  Service: Orthopedics;  Laterality: Right;  Right Total Hip Arthroplasty, Anterior Approach   Patient Active Problem List   Diagnosis Date Noted   Rotator cuff tendonitis, right 07/02/2022   Nausea 12/02/2021   Bright red rectal bleeding 09/15/2021   History of colonic polyps 07/13/2021   Gastroenteritis  09/09/2020   Anemia 03/06/2020   Hypothyroidism 02/04/2020   Depression 01/24/2020   Asthma 01/24/2020   CKD (chronic kidney disease), stage III ([place]) 01/24/2020   Acute on chronic renal failure (HCC) 01/24/2020   Constipation 12/20/2018   Pain in right hip 06/29/2017   Trochanteric bursitis, right hip 06/29/2017   History of total replacement of right hip 06/29/2017   Hemorrhoids 03/01/2017   Great toe pain, left 12/04/2016   Chronic right shoulder pain 12/04/2016   Non-toxic multinodular goiter 05/14/2013   Degenerative arthritis of hip 12/10/2011   HTN (hypertension) 07/06/2011   Hyperlipidemia 07/06/2011   Gastro-esophageal reflux disease without esophagitis 09/23/2009     THERAPY DIAG:  Chronic right shoulder pain   PCP: Burnard Bunting MD  REFERRING PROVIDER: Persons, Bevely Palmer, [place]  REFERRING DIAG: [id number] (ICD-10-CM) - Chronic right shoulder pain  EVAL THERAPY DIAG:  Chronic right shoulder pain  Muscle weakness (generalized)  Rationale for Evaluation and Treatment: Rehabilitation  ONSET DATE: Sept 2023  SUBJECTIVE:  SUBJECTIVE STATEMENT: Pain is a little higher today, doesn't like the ER isometric exercise.  Pain has been better controlled up until today.  PERTINENT HISTORY: CKD, history of chronic back pain.  History of physical therapy for back/hip.   PAIN:  NPRS scale: at current 6/10, 8-9/10 Pain location: Rt shoulder  (anterior, upper arm, back of shoulder) Pain description: stabbing at times,  Aggravating factors: lifting items, carrying Relieving factors: Voltaren gel, patches, tylenol  PRECAUTIONS: None  WEIGHT BEARING RESTRICTIONS: No  FALLS:  Has patient fallen in last 6 months? No  LIVING ENVIRONMENT: Lives in: House/apartment Stairs: has stairs at  home   OCCUPATION: No work  PLOF: Independent, Rt hand dominant, read, puzzles, household activity, no yard work.   PATIENT GOALS:Reduce pain, being able to lift  Next MD visit:  none listed  OBJECTIVE:   PATIENT SURVEYS:  07/14/2022 FOTO intake:  54   predicted:  62  COGNITION: 07/14/2022 Overall cognitive status: WFL     SENSATION: 07/14/2022 WFL  POSTURE: 07/14/2022 Mild scapular protraction/FHP  UPPER EXTREMITY ROM:   07/14/2022:  Against gravity end range pain Rt shoulder in flexion/abduction, WFL.No limits in supine and WFL in all directions.    ROM Right 07/14/2022 Left 07/14/2022  Shoulder flexion    Shoulder extension    Shoulder abduction    Shoulder adduction    Shoulder internal rotation    Shoulder external rotation    Elbow flexion    Elbow extension    Wrist flexion    Wrist extension    Wrist ulnar deviation    Wrist radial deviation    Wrist pronation    Wrist supination    (Blank rows = not tested)  UPPER EXTREMITY MMT:  MMT Right 07/14/2022 Left 07/14/2022  Shoulder flexion 4/5 c pain 5/5  Shoulder extension    Shoulder abduction 4/5 c pain 5/5  Shoulder adduction    Shoulder internal rotation 5/5 5/5  Shoulder external rotation 4/5 c pain 5/5  Middle trapezius    Lower trapezius    Elbow flexion    Elbow extension    Wrist flexion    Wrist extension    Wrist ulnar deviation    Wrist radial deviation    Wrist pronation    Wrist supination    Grip strength (lbs)    (Blank rows = not tested)  SHOULDER SPECIAL TESTS: 07/14/2022 (-) Rt empty can, painful arc  JOINT MOBILITY TESTING:  07/14/2022 No restriction  PALPATION:  07/14/2022 Trigger point   TODAY'S TREATMENT:                                                                                                                           DATE:07/28/2022 Therex: UBE L1 x 5 min forward Scapular retraction x 10 reps; 5 sec hold ER isometrics x 10 reps with 5 sec  hold (using Lt arm as resistance - modified from home program) Posterior capsule stretch on Rt 3x15 sec hold  Barrel hug stretch 5 x 10 sec hold Supine shoulder flexion 1# 2x10; Right Supine rhythmic stabilization circles 1# x15 reps CW/CCW circles Side lying Rt abduction to 90 deg 1# 2x10 Side lying Rt ER 1# 2x10 Rows L3 band 2x10; 5 sec hold  TODAY'S TREATMENT:                                                                                                                           DATE:07/14/2022 Therex:    HEP instruction/performance c cues for techniques, handout provided.  Trial set performed of each for comprehension and symptom assessment.  See below for exercise list   PATIENT EDUCATION: 07/14/2022 Education details: HEP, POC Person educated: Patient Education method: Explanation, Demonstration, Verbal cues, and Handouts Education comprehension: verbalized understanding, returned demonstration, and verbal cues required  HOME EXERCISE PROGRAM: Access Code: [email or web address] URL: [email or web address] Date: 07/14/2022 Prepared by: Scot Jun  Exercises - Seated Scapular Retraction  - 3-5 x daily - 7 x weekly - 1 sets - 10 reps - 3-5 hold - Standing Isometric Shoulder External Rotation with Doorway (Mirrored)  - 2 x daily - 7 x weekly - 1 sets - 10 reps - 5 hold - Standing Shoulder Posterior Capsule Stretch (Mirrored)  - 2 x daily - 7 x weekly - 1 sets - 3-5 reps - 15 hold - Seated 55 Gallon Barrel Hug Stretch  - 2 x daily - 7 x weekly - 1 sets - 10 reps - 5 hold  ASSESSMENT:  CLINICAL IMPRESSION: Pt demonstrated good understanding of initial HEP today, but did modify isometric ER to hand as she seemed to be pushing too hard causing pain with wall.  Able to tolerate light strengthening today.  No goals met as only 2nd visit.  OBJECTIVE IMPAIRMENTS: decreased activity tolerance, decreased coordination, decreased endurance, decreased strength, increased fascial  restrictions, impaired perceived functional ability, improper body mechanics, and pain.   ACTIVITY LIMITATIONS: carrying, lifting, reach over head, and hygiene/grooming  PARTICIPATION LIMITATIONS: meal prep, cleaning, laundry, interpersonal relationship, and community activity  PERSONAL FACTORS: no specific factors are affecting patient's functional outcome.   REHAB POTENTIAL: Good  CLINICAL DECISION MAKING: Stable/uncomplicated  EVALUATION COMPLEXITY: Low   GOALS: Goals reviewed with patient? Yes  SHORT TERM GOALS: (target date for Short term goals are 3 weeks 08/03/2022)  1.Patient will demonstrate independent use of home exercise program to maintain progress from in clinic treatments. Goal status: New  LONG TERM GOALS: (target dates for all long term goals are 10 weeks 09/21/2022 )   1. Patient will demonstrate/report pain at worst less than or equal to 2/10 to facilitate minimal limitation in daily activity secondary to pain symptoms. Goal status: New   2. Patient will demonstrate independent use of home exercise program to facilitate ability to maintain/progress functional gains from skilled physical therapy services. Goal status: New   3. Patient will demonstrate FOTO outcome > or = 62 % to indicate reduced disability due to condition.  Goal status: New   4.  Patient will demonstrate Rt UE MMT 5/5 throughout to facilitate lifting, reaching, carrying at [facility] in daily activity.   Goal status: New   5.  Patient will demonstrate Rt GH joint AROM WFL s symptoms to facilitate usual overhead reaching, self care, dressing at PLOF.    Goal status: New   6.  Patient will demonstrate/report ability to sleep s restriction. Goal status: New   PLAN:  PT FREQUENCY: 1-2x/week  PT DURATION: 10 weeks  PLANNED INTERVENTIONS: Therapeutic exercises, Therapeutic activity, Neuro Muscular re-education, Balance training, Gait training, Patient/Family education, Joint mobilization, Stair  training, DME instructions, Dry Needling, Electrical stimulation, Traction, Cryotherapy, vasopneumatic device Moist heat, Taping, Ultrasound, Ionotophoresis 51m/ml Dexamethasone, and Manual therapy.  All included unless contraindicated  PLAN FOR NEXT SESSION:  Early strengthening for ER and scapular strength.     SLaureen Abrahams PT, DPT 07/28/22 2:27 PM

## 2022-07-30 ENCOUNTER — Ambulatory Visit (INDEPENDENT_AMBULATORY_CARE_PROVIDER_SITE_OTHER): Payer: Medicare Other | Admitting: Physical Therapy

## 2022-07-30 ENCOUNTER — Encounter: Payer: Self-pay | Admitting: Physical Therapy

## 2022-07-30 DIAGNOSIS — M25511 Pain in right shoulder: Secondary | ICD-10-CM

## 2022-07-30 DIAGNOSIS — M6281 Muscle weakness (generalized): Secondary | ICD-10-CM | POA: Diagnosis not present

## 2022-07-30 DIAGNOSIS — G8929 Other chronic pain: Secondary | ICD-10-CM | POA: Diagnosis not present

## 2022-07-30 NOTE — Therapy (Signed)
OUTPATIENT PHYSICAL THERAPY TREATMENT NOTE   Patient Name: PAMELA INTRIERI MRN: [id number] DOB:07/25/1946, 76 y.o., female Today's Date: 07/30/2022  END OF SESSION:   PT End of Session - 07/30/22 1012     Visit Number 3    Number of Visits 20    Date for PT Re-Evaluation 09/22/22    Authorization Type Medicare and [place]    Progress Note Due on Visit 10    PT Start Time 1013    PT Stop Time 1051    PT Time Calculation (min) 38 min    Activity Tolerance Patient tolerated treatment well    Behavior During Therapy WFL for tasks assessed/performed              Past Medical History:  Diagnosis Date   Arthritis    Asthma    Bronchitis 03/2016   Chronic kidney disease    stage 3   Colon polyps    tubular adenoma   Constipation    Depression    Diverticulosis    GERD (gastroesophageal reflux disease)    Hemorrhoids    Hyperlipidemia    Hypertension    Hypothyroidism    Shortness of breath    with exertion on occasion   Sinusitis    Skin cancer    Past Surgical History:  Procedure Laterality Date   ABDOMINAL HYSTERECTOMY     CARPOMETACARPAL ([place]) FUSION OF THUMB Bilateral    CATARACT EXTRACTION, BILATERAL     CHOLECYSTECTOMY     COLONOSCOPY  02/12/2011   HAMMER TOE SURGERY Right    LAPAROSCOPIC APPENDECTOMY N/A 08/26/2021   Procedure: APPENDECTOMY LAPAROSCOPIC WITH LYSIS OF ADHESIONS;  Surgeon: Michael Boston, MD;  Location: [facility];  Service: General;  Laterality: N/A;   NASAL SINUS SURGERY     POLYPECTOMY     TOTAL HIP ARTHROPLASTY  12/10/2011   Procedure: TOTAL HIP ARTHROPLASTY ANTERIOR APPROACH;  Surgeon: Mcarthur Rossetti, MD;  Location: [facility];  Service: Orthopedics;  Laterality: Right;  Right Total Hip Arthroplasty, Anterior Approach   Patient Active Problem List   Diagnosis Date Noted   Rotator cuff tendonitis, right 07/02/2022   Nausea 12/02/2021   Bright red rectal bleeding 09/15/2021   History of colonic polyps 07/13/2021   Gastroenteritis  09/09/2020   Anemia 03/06/2020   Hypothyroidism 02/04/2020   Depression 01/24/2020   Asthma 01/24/2020   CKD (chronic kidney disease), stage III ([place]) 01/24/2020   Acute on chronic renal failure (HCC) 01/24/2020   Constipation 12/20/2018   Pain in right hip 06/29/2017   Trochanteric bursitis, right hip 06/29/2017   History of total replacement of right hip 06/29/2017   Hemorrhoids 03/01/2017   Great toe pain, left 12/04/2016   Chronic right shoulder pain 12/04/2016   Non-toxic multinodular goiter 05/14/2013   Degenerative arthritis of hip 12/10/2011   HTN (hypertension) 07/06/2011   Hyperlipidemia 07/06/2011   Gastro-esophageal reflux disease without esophagitis 09/23/2009     THERAPY DIAG:  Chronic right shoulder pain  Muscle weakness (generalized)   PCP: Burnard Bunting MD  REFERRING PROVIDER: Persons, Bevely Palmer, [place]  REFERRING DIAG: [phone] (ICD-10-CM) - Chronic right shoulder pain  EVAL THERAPY DIAG:  Chronic right shoulder pain  Muscle weakness (generalized)  Rationale for Evaluation and Treatment: Rehabilitation  ONSET DATE: Sept 2023  SUBJECTIVE:  SUBJECTIVE STATEMENT: No pain upon arrival; did admit to not doing HEP as consistently as she should   PERTINENT HISTORY: CKD, history of chronic back pain.  History of physical therapy for back/hip.   PAIN:  NPRS scale: at current /10, 8-9/10 Pain location: Rt shoulder  (anterior, upper arm, back of shoulder) Pain description: stabbing at times,  Aggravating factors: lifting items, carrying Relieving factors: Voltaren gel, patches, tylenol  PRECAUTIONS: None  WEIGHT BEARING RESTRICTIONS: No  FALLS:  Has patient fallen in last 6 months? No  LIVING ENVIRONMENT: Lives in: House/apartment Stairs: has stairs at  home   OCCUPATION: No work  PLOF: Independent, Rt hand dominant, read, puzzles, household activity, no yard work.   PATIENT GOALS:Reduce pain, being able to lift  Next MD visit:  none listed  OBJECTIVE:   PATIENT SURVEYS:  07/14/2022 FOTO intake:  54   predicted:  62  COGNITION: 07/14/2022 Overall cognitive status: WFL     SENSATION: 07/14/2022 WFL  POSTURE: 07/14/2022 Mild scapular protraction/FHP  UPPER EXTREMITY ROM:   07/14/2022:  Against gravity end range pain Rt shoulder in flexion/abduction, WFL.No limits in supine and WFL in all directions.    ROM Right 07/14/2022 Left 07/14/2022  Shoulder flexion    Shoulder extension    Shoulder abduction    Shoulder adduction    Shoulder internal rotation    Shoulder external rotation    Elbow flexion    Elbow extension    Wrist flexion    Wrist extension    Wrist ulnar deviation    Wrist radial deviation    Wrist pronation    Wrist supination    (Blank rows = not tested)  UPPER EXTREMITY MMT:  MMT Right 07/14/2022 Left 07/14/2022  Shoulder flexion 4/5 c pain 5/5  Shoulder extension    Shoulder abduction 4/5 c pain 5/5  Shoulder adduction    Shoulder internal rotation 5/5 5/5  Shoulder external rotation 4/5 c pain 5/5  Middle trapezius    Lower trapezius    Elbow flexion    Elbow extension    Wrist flexion    Wrist extension    Wrist ulnar deviation    Wrist radial deviation    Wrist pronation    Wrist supination    Grip strength (lbs)    (Blank rows = not tested)  SHOULDER SPECIAL TESTS: 07/14/2022 (-) Rt empty can, painful arc  JOINT MOBILITY TESTING:  07/14/2022 No restriction  PALPATION:  07/14/2022 Trigger point  TODAY'S TREATMENT:                                                                                                                           DATE:07/30/2022 Therex: Rows L3 band 2x10; 5 sec hold Standing extension L3 band 2x10; 5 sec hold Standing ER partial range  L2 band 2x10 UBE L1 x 5 min forward Supine shoulder flexion 1# 2x10; Right Supine rhythmic stabilization circles 1# x15 reps CW/CCW circles Posterior capsule stretch on  Rt 3x15 sec hold    TODAY'S TREATMENT:                                                                                                                           DATE:07/28/2022 Therex: UBE L1 x 5 min forward Scapular retraction x 10 reps; 5 sec hold ER isometrics x 10 reps with 5 sec hold (using Lt arm as resistance - modified from home program) Posterior capsule stretch on Rt 3x15 sec hold Barrel hug stretch 5 x 10 sec hold Supine shoulder flexion 1# 2x10; Right Supine rhythmic stabilization circles 1# x15 reps CW/CCW circles Side lying Rt abduction to 90 deg 1# 2x10 Side lying Rt ER 1# 2x10 Rows L3 band 2x10; 5 sec hold  TODAY'S TREATMENT:                                                                                                                           DATE:07/14/2022 Therex:    HEP instruction/performance c cues for techniques, handout provided.  Trial set performed of each for comprehension and symptom assessment.  See below for exercise list   PATIENT EDUCATION: 07/14/2022 Education details: HEP, POC Person educated: Patient Education method: Explanation, Demonstration, Verbal cues, and Handouts Education comprehension: verbalized understanding, returned demonstration, and verbal cues required  HOME EXERCISE PROGRAM: Access Code: [email or web address] URL: [email or web address] Date: 07/14/2022 Prepared by: Scot Jun  Exercises - Seated Scapular Retraction  - 3-5 x daily - 7 x weekly - 1 sets - 10 reps - 3-5 hold - Standing Isometric Shoulder External Rotation with Doorway (Mirrored)  - 2 x daily - 7 x weekly - 1 sets - 10 reps - 5 hold - Standing Shoulder Posterior Capsule Stretch (Mirrored)  - 2 x daily - 7 x weekly - 1 sets - 3-5 reps - 15 hold - Seated 55 Gallon Barrel Hug Stretch  - 2 x  daily - 7 x weekly - 1 sets - 10 reps - 5 hold  ASSESSMENT:  CLINICAL IMPRESSION: Pt tolerated session well today without increase in pain.  Pt reported increased sweating recently, BP 118/100 after UBE.  Advised to monitor BP at home as she self weaned from HTN medication a couple months ago.  Pt verbalized understanding.  Continue skilled PT.   OBJECTIVE IMPAIRMENTS: decreased activity tolerance, decreased coordination, decreased endurance, decreased strength, increased fascial restrictions, impaired perceived functional ability, improper body mechanics, and pain.   ACTIVITY LIMITATIONS:  carrying, lifting, reach over head, and hygiene/grooming  PARTICIPATION LIMITATIONS: meal prep, cleaning, laundry, interpersonal relationship, and community activity  PERSONAL FACTORS: no specific factors are affecting patient's functional outcome.   REHAB POTENTIAL: Good  CLINICAL DECISION MAKING: Stable/uncomplicated  EVALUATION COMPLEXITY: Low   GOALS: Goals reviewed with patient? Yes  SHORT TERM GOALS: (target date for Short term goals are 3 weeks 08/03/2022)  1.Patient will demonstrate independent use of home exercise program to maintain progress from in clinic treatments. Goal status: New  LONG TERM GOALS: (target dates for all long term goals are 10 weeks 09/21/2022 )   1. Patient will demonstrate/report pain at worst less than or equal to 2/10 to facilitate minimal limitation in daily activity secondary to pain symptoms. Goal status: New   2. Patient will demonstrate independent use of home exercise program to facilitate ability to maintain/progress functional gains from skilled physical therapy services. Goal status: New   3. Patient will demonstrate FOTO outcome > or = 62 % to indicate reduced disability due to condition. Goal status: New   4.  Patient will demonstrate Rt UE MMT 5/5 throughout to facilitate lifting, reaching, carrying at [facility] in daily activity.   Goal status:  New   5.  Patient will demonstrate Rt GH joint AROM WFL s symptoms to facilitate usual overhead reaching, self care, dressing at PLOF.    Goal status: New   6.  Patient will demonstrate/report ability to sleep s restriction. Goal status: New   PLAN:  PT FREQUENCY: 1-2x/week  PT DURATION: 10 weeks  PLANNED INTERVENTIONS: Therapeutic exercises, Therapeutic activity, Neuro Muscular re-education, Balance training, Gait training, Patient/Family education, Joint mobilization, Stair training, DME instructions, Dry Needling, Electrical stimulation, Traction, Cryotherapy, vasopneumatic device Moist heat, Taping, Ultrasound, Ionotophoresis '4mg'$ /ml Dexamethasone, and Manual therapy.  All included unless contraindicated  PLAN FOR NEXT SESSION:  Early strengthening for ER and scapular strength, how is BP?    Laureen Abrahams, PT, DPT 07/30/22 10:52 AM

## 2022-08-02 ENCOUNTER — Encounter: Payer: Self-pay | Admitting: Physical Therapy

## 2022-08-02 ENCOUNTER — Ambulatory Visit (INDEPENDENT_AMBULATORY_CARE_PROVIDER_SITE_OTHER): Payer: Medicare Other | Admitting: Physical Therapy

## 2022-08-02 DIAGNOSIS — M25511 Pain in right shoulder: Secondary | ICD-10-CM | POA: Diagnosis not present

## 2022-08-02 DIAGNOSIS — G8929 Other chronic pain: Secondary | ICD-10-CM | POA: Diagnosis not present

## 2022-08-02 DIAGNOSIS — M6281 Muscle weakness (generalized): Secondary | ICD-10-CM | POA: Diagnosis not present

## 2022-08-02 NOTE — Therapy (Signed)
OUTPATIENT PHYSICAL THERAPY TREATMENT NOTE   Patient Name: MANREET KIERNAN MRN: [id number] DOB:March 17, 1946, 76 y.o., female Today's Date: 08/02/2022  END OF SESSION:   PT End of Session - 08/02/22 1521     Visit Number 4    Number of Visits 20    Date for PT Re-Evaluation 09/22/22    Authorization Type Medicare and [place]    Progress Note Due on Visit 10    PT Start Time 0762    PT Stop Time 1555    PT Time Calculation (min) 40 min    Activity Tolerance Patient tolerated treatment well    Behavior During Therapy WFL for tasks assessed/performed               Past Medical History:  Diagnosis Date   Arthritis    Asthma    Bronchitis 03/2016   Chronic kidney disease    stage 3   Colon polyps    tubular adenoma   Constipation    Depression    Diverticulosis    GERD (gastroesophageal reflux disease)    Hemorrhoids    Hyperlipidemia    Hypertension    Hypothyroidism    Shortness of breath    with exertion on occasion   Sinusitis    Skin cancer    Past Surgical History:  Procedure Laterality Date   ABDOMINAL HYSTERECTOMY     CARPOMETACARPAL ([place]) FUSION OF THUMB Bilateral    CATARACT EXTRACTION, BILATERAL     CHOLECYSTECTOMY     COLONOSCOPY  02/12/2011   HAMMER TOE SURGERY Right    LAPAROSCOPIC APPENDECTOMY N/A 08/26/2021   Procedure: APPENDECTOMY LAPAROSCOPIC WITH LYSIS OF ADHESIONS;  Surgeon: Michael Boston, MD;  Location: [facility];  Service: General;  Laterality: N/A;   NASAL SINUS SURGERY     POLYPECTOMY     TOTAL HIP ARTHROPLASTY  12/10/2011   Procedure: TOTAL HIP ARTHROPLASTY ANTERIOR APPROACH;  Surgeon: Mcarthur Rossetti, MD;  Location: [facility];  Service: Orthopedics;  Laterality: Right;  Right Total Hip Arthroplasty, Anterior Approach   Patient Active Problem List   Diagnosis Date Noted   Rotator cuff tendonitis, right 07/02/2022   Nausea 12/02/2021   Bright red rectal bleeding 09/15/2021   History of colonic polyps 07/13/2021    Gastroenteritis 09/09/2020   Anemia 03/06/2020   Hypothyroidism 02/04/2020   Depression 01/24/2020   Asthma 01/24/2020   CKD (chronic kidney disease), stage III ([place]) 01/24/2020   Acute on chronic renal failure (HCC) 01/24/2020   Constipation 12/20/2018   Pain in right hip 06/29/2017   Trochanteric bursitis, right hip 06/29/2017   History of total replacement of right hip 06/29/2017   Hemorrhoids 03/01/2017   Great toe pain, left 12/04/2016   Chronic right shoulder pain 12/04/2016   Non-toxic multinodular goiter 05/14/2013   Degenerative arthritis of hip 12/10/2011   HTN (hypertension) 07/06/2011   Hyperlipidemia 07/06/2011   Gastro-esophageal reflux disease without esophagitis 09/23/2009     THERAPY DIAG:  Chronic right shoulder pain  Muscle weakness (generalized)   PCP: Burnard Bunting MD  REFERRING PROVIDER: Persons, Bevely Palmer, [place]  REFERRING DIAG: [phone] (ICD-10-CM) - Chronic right shoulder pain  EVAL THERAPY DIAG:  Chronic right shoulder pain  Muscle weakness (generalized)  Rationale for Evaluation and Treatment: Rehabilitation  ONSET DATE: Sept 2023  SUBJECTIVE:  SUBJECTIVE STATEMENT: Doing well, no complaints   PERTINENT HISTORY: CKD, history of chronic back pain.  History of physical therapy for back/hip.   PAIN:  NPRS scale: at current /10, 8-9/10 Pain location: Rt shoulder  (anterior, upper arm, back of shoulder) Pain description: stabbing at times,  Aggravating factors: lifting items, carrying Relieving factors: Voltaren gel, patches, tylenol  PRECAUTIONS: None  WEIGHT BEARING RESTRICTIONS: No  FALLS:  Has patient fallen in last 6 months? No  LIVING ENVIRONMENT: Lives in: House/apartment Stairs: has stairs at home   OCCUPATION: No work  PLOF:  Independent, Rt hand dominant, read, puzzles, household activity, no yard work.   PATIENT GOALS:Reduce pain, being able to lift  Next MD visit:  none listed  OBJECTIVE:   PATIENT SURVEYS:  07/14/2022 FOTO intake:  54   predicted:  62  COGNITION: 07/14/2022 Overall cognitive status: WFL     SENSATION: 07/14/2022 WFL  POSTURE: 07/14/2022 Mild scapular protraction/FHP  UPPER EXTREMITY ROM:   07/14/2022:  Against gravity end range pain Rt shoulder in flexion/abduction, WFL.No limits in supine and WFL in all directions.    ROM Right 07/14/2022 Left 07/14/2022  Shoulder flexion    Shoulder extension    Shoulder abduction    Shoulder adduction    Shoulder internal rotation    Shoulder external rotation    Elbow flexion    Elbow extension    Wrist flexion    Wrist extension    Wrist ulnar deviation    Wrist radial deviation    Wrist pronation    Wrist supination    (Blank rows = not tested)  UPPER EXTREMITY MMT:  MMT Right 07/14/2022 Left 07/14/2022  Shoulder flexion 4/5 c pain 5/5  Shoulder extension    Shoulder abduction 4/5 c pain 5/5  Shoulder adduction    Shoulder internal rotation 5/5 5/5  Shoulder external rotation 4/5 c pain 5/5  Middle trapezius    Lower trapezius    Elbow flexion    Elbow extension    Wrist flexion    Wrist extension    Wrist ulnar deviation    Wrist radial deviation    Wrist pronation    Wrist supination    Grip strength (lbs)    (Blank rows = not tested)  SHOULDER SPECIAL TESTS: 07/14/2022 (-) Rt empty can, painful arc  JOINT MOBILITY TESTING:  07/14/2022 No restriction  PALPATION:  07/14/2022 Trigger point  TODAY'S TREATMENT:                                                                                                                           DATE:08/02/2022 Therex: UBE L3 x 5 min forward Rows L3 band 2x10; 5 sec hold Standing IR 2 x 10; L3 band Standing ER partial range L3 band 2 x 10 Standing Rt  shoulder flexion x 15 reps Standing Rt scaption 2 x 10 Wall ladder flexion and scaption x 10 reps each; Rt   TODAY'S TREATMENT:  DATE:07/30/2022 Therex: Rows L3 band 2x10; 5 sec hold Standing extension L3 band 2x10; 5 sec hold Standing ER partial range L2 band 2x10 UBE L1 x 5 min forward Supine shoulder flexion 1# 2x10; Right Supine rhythmic stabilization circles 1# x15 reps CW/CCW circles Posterior capsule stretch on Rt 3x15 sec hold    TODAY'S TREATMENT:                                                                                                                           DATE:07/28/2022 Therex: UBE L1 x 5 min forward Scapular retraction x 10 reps; 5 sec hold ER isometrics x 10 reps with 5 sec hold (using Lt arm as resistance - modified from home program) Posterior capsule stretch on Rt 3x15 sec hold Barrel hug stretch 5 x 10 sec hold Supine shoulder flexion 1# 2x10; Right Supine rhythmic stabilization circles 1# x15 reps CW/CCW circles Side lying Rt abduction to 90 deg 1# 2x10 Side lying Rt ER 1# 2x10 Rows L3 band 2x10; 5 sec hold  TODAY'S TREATMENT:                                                                                                                           DATE:07/14/2022 Therex:    HEP instruction/performance c cues for techniques, handout provided.  Trial set performed of each for comprehension and symptom assessment.  See below for exercise list   PATIENT EDUCATION: 07/14/2022 Education details: HEP, POC Person educated: Patient Education method: Explanation, Demonstration, Verbal cues, and Handouts Education comprehension: verbalized understanding, returned demonstration, and verbal cues required  HOME EXERCISE PROGRAM: Access Code: [email or web address] URL: [email or web address] Date: 07/14/2022 Prepared by: Scot Jun  Exercises - Seated Scapular Retraction  - 3-5 x daily - 7 x weekly - 1 sets - 10 reps - 3-5 hold - Standing Isometric Shoulder External Rotation with Doorway (Mirrored)  - 2 x daily - 7 x weekly - 1 sets - 10 reps - 5 hold - Standing Shoulder Posterior Capsule Stretch (Mirrored)  - 2 x daily - 7 x weekly - 1 sets - 3-5 reps - 15 hold - Seated 55 Gallon Barrel Hug Stretch  - 2 x daily - 7 x weekly - 1 sets - 10 reps - 5 hold  ASSESSMENT:  CLINICAL IMPRESSION: Continued focus on strengthening and maximizing ROM.  Will continue to benefit from PT to maximize function.   OBJECTIVE IMPAIRMENTS: decreased activity tolerance, decreased  coordination, decreased endurance, decreased strength, increased fascial restrictions, impaired perceived functional ability, improper body mechanics, and pain.   ACTIVITY LIMITATIONS: carrying, lifting, reach over head, and hygiene/grooming  PARTICIPATION LIMITATIONS: meal prep, cleaning, laundry, interpersonal relationship, and community activity  PERSONAL FACTORS: no specific factors are affecting patient's functional outcome.   REHAB POTENTIAL: Good  CLINICAL DECISION MAKING: Stable/uncomplicated  EVALUATION COMPLEXITY: Low   GOALS: Goals reviewed with patient? Yes  SHORT TERM GOALS: (target date for Short term goals are 3 weeks 08/03/2022)  1.Patient will demonstrate independent use of home exercise program to maintain progress from in clinic treatments. Goal status: New  LONG TERM GOALS: (target dates for all long term goals are 10 weeks 09/21/2022 )   1. Patient will demonstrate/report pain at worst less than or equal to 2/10 to facilitate minimal limitation in daily activity secondary to pain symptoms. Goal status: New   2. Patient will demonstrate independent use of home exercise program to facilitate ability to maintain/progress functional gains from skilled physical therapy services. Goal status: New   3. Patient will demonstrate  FOTO outcome > or = 62 % to indicate reduced disability due to condition. Goal status: New   4.  Patient will demonstrate Rt UE MMT 5/5 throughout to facilitate lifting, reaching, carrying at [facility] in daily activity.   Goal status: New   5.  Patient will demonstrate Rt GH joint AROM WFL s symptoms to facilitate usual overhead reaching, self care, dressing at PLOF.    Goal status: New   6.  Patient will demonstrate/report ability to sleep s restriction. Goal status: New   PLAN:  PT FREQUENCY: 1-2x/week  PT DURATION: 10 weeks  PLANNED INTERVENTIONS: Therapeutic exercises, Therapeutic activity, Neuro Muscular re-education, Balance training, Gait training, Patient/Family education, Joint mobilization, Stair training, DME instructions, Dry Needling, Electrical stimulation, Traction, Cryotherapy, vasopneumatic device Moist heat, Taping, Ultrasound, Ionotophoresis '4mg'$ /ml Dexamethasone, and Manual therapy.  All included unless contraindicated  PLAN FOR NEXT SESSION:  Early strengthening for ER and scapular strength, monitor BP PRN    Laureen Abrahams, PT, DPT 08/02/22 3:55 PM

## 2022-08-04 ENCOUNTER — Ambulatory Visit (INDEPENDENT_AMBULATORY_CARE_PROVIDER_SITE_OTHER): Payer: Medicare Other | Admitting: Physical Therapy

## 2022-08-04 ENCOUNTER — Encounter: Payer: Self-pay | Admitting: Physical Therapy

## 2022-08-04 DIAGNOSIS — G8929 Other chronic pain: Secondary | ICD-10-CM

## 2022-08-04 DIAGNOSIS — Z0000 Encounter for general adult medical examination without abnormal findings: Secondary | ICD-10-CM | POA: Diagnosis not present

## 2022-08-04 DIAGNOSIS — M25511 Pain in right shoulder: Secondary | ICD-10-CM | POA: Diagnosis not present

## 2022-08-04 DIAGNOSIS — E669 Obesity, unspecified: Secondary | ICD-10-CM | POA: Diagnosis not present

## 2022-08-04 DIAGNOSIS — E785 Hyperlipidemia, unspecified: Secondary | ICD-10-CM | POA: Diagnosis not present

## 2022-08-04 DIAGNOSIS — R8281 Pyuria: Secondary | ICD-10-CM | POA: Diagnosis not present

## 2022-08-04 DIAGNOSIS — N1831 Chronic kidney disease, stage 3a: Secondary | ICD-10-CM | POA: Diagnosis not present

## 2022-08-04 DIAGNOSIS — M6281 Muscle weakness (generalized): Secondary | ICD-10-CM

## 2022-08-04 DIAGNOSIS — I10 Essential (primary) hypertension: Secondary | ICD-10-CM | POA: Diagnosis not present

## 2022-08-04 DIAGNOSIS — I129 Hypertensive chronic kidney disease with stage 1 through stage 4 chronic kidney disease, or unspecified chronic kidney disease: Secondary | ICD-10-CM | POA: Diagnosis not present

## 2022-08-04 NOTE — Therapy (Signed)
OUTPATIENT PHYSICAL THERAPY TREATMENT NOTE   Patient Name: Cassidy Garcia MRN: [id number] DOB:Feb 10, 1946, 76 y.o., female Today's Date: 08/04/2022  END OF SESSION:   PT End of Session - 08/04/22 1021     Visit Number 5    Number of Visits 20    Date for PT Re-Evaluation 09/22/22    Authorization Type Medicare and [place]    Progress Note Due on Visit 10    PT Start Time 1018    PT Stop Time 1058    PT Time Calculation (min) 40 min    Activity Tolerance Patient tolerated treatment well    Behavior During Therapy WFL for tasks assessed/performed                Past Medical History:  Diagnosis Date   Arthritis    Asthma    Bronchitis 03/2016   Chronic kidney disease    stage 3   Colon polyps    tubular adenoma   Constipation    Depression    Diverticulosis    GERD (gastroesophageal reflux disease)    Hemorrhoids    Hyperlipidemia    Hypertension    Hypothyroidism    Shortness of breath    with exertion on occasion   Sinusitis    Skin cancer    Past Surgical History:  Procedure Laterality Date   ABDOMINAL HYSTERECTOMY     CARPOMETACARPAL ([place]) FUSION OF THUMB Bilateral    CATARACT EXTRACTION, BILATERAL     CHOLECYSTECTOMY     COLONOSCOPY  02/12/2011   HAMMER TOE SURGERY Right    LAPAROSCOPIC APPENDECTOMY N/A 08/26/2021   Procedure: APPENDECTOMY LAPAROSCOPIC WITH LYSIS OF ADHESIONS;  Surgeon: Michael Boston, MD;  Location: [facility];  Service: General;  Laterality: N/A;   NASAL SINUS SURGERY     POLYPECTOMY     TOTAL HIP ARTHROPLASTY  12/10/2011   Procedure: TOTAL HIP ARTHROPLASTY ANTERIOR APPROACH;  Surgeon: Mcarthur Rossetti, MD;  Location: [facility];  Service: Orthopedics;  Laterality: Right;  Right Total Hip Arthroplasty, Anterior Approach   Patient Active Problem List   Diagnosis Date Noted   Rotator cuff tendonitis, right 07/02/2022   Nausea 12/02/2021   Bright red rectal bleeding 09/15/2021   History of colonic polyps 07/13/2021    Gastroenteritis 09/09/2020   Anemia 03/06/2020   Hypothyroidism 02/04/2020   Depression 01/24/2020   Asthma 01/24/2020   CKD (chronic kidney disease), stage III ([place]) 01/24/2020   Acute on chronic renal failure (HCC) 01/24/2020   Constipation 12/20/2018   Pain in right hip 06/29/2017   Trochanteric bursitis, right hip 06/29/2017   History of total replacement of right hip 06/29/2017   Hemorrhoids 03/01/2017   Great toe pain, left 12/04/2016   Chronic right shoulder pain 12/04/2016   Non-toxic multinodular goiter 05/14/2013   Degenerative arthritis of hip 12/10/2011   HTN (hypertension) 07/06/2011   Hyperlipidemia 07/06/2011   Gastro-esophageal reflux disease without esophagitis 09/23/2009     THERAPY DIAG:  Chronic right shoulder pain  Muscle weakness (generalized)   PCP: Burnard Bunting MD  REFERRING PROVIDER: Persons, Bevely [place], [place]  REFERRING DIAG: [phone] (ICD-10-CM) - Chronic right shoulder pain  EVAL THERAPY DIAG:  Chronic right shoulder pain  Muscle weakness (generalized)  Rationale for Evaluation and Treatment: Rehabilitation  ONSET DATE: Sept 2023  SUBJECTIVE:  SUBJECTIVE STATEMENT: Only having pain when reaching out with arms in forward flexion  PERTINENT HISTORY: CKD, history of chronic back pain.  History of physical therapy for back/hip.   PAIN:  NPRS scale: 0 at current /10, 8-9/10 Pain location: Rt shoulder  (anterior, upper arm, back of shoulder) Pain description: stabbing at times,  Aggravating factors: lifting items, carrying Relieving factors: Voltaren gel, patches, tylenol  PRECAUTIONS: None  WEIGHT BEARING RESTRICTIONS: No  FALLS:  Has patient fallen in last 6 months? No  LIVING ENVIRONMENT: Lives in: House/apartment Stairs: has stairs at  home   OCCUPATION: No work  PLOF: Independent, Rt hand dominant, read, puzzles, household activity, no yard work.   PATIENT GOALS:Reduce pain, being able to lift  Next MD visit:  none listed  OBJECTIVE:   PATIENT SURVEYS:  07/14/2022 FOTO intake:  54   predicted:  62  COGNITION: 07/14/2022 Overall cognitive status: WFL     SENSATION: 07/14/2022 WFL  POSTURE: 07/14/2022 Mild scapular protraction/FHP  UPPER EXTREMITY ROM:   07/14/2022:  Against gravity end range pain Rt shoulder in flexion/abduction, WFL.No limits in supine and WFL in all directions.    ROM Right 07/14/2022 Left 07/14/2022  Shoulder flexion 145 (sitting)   Shoulder extension    Shoulder abduction 120 (sitting)   Shoulder adduction    Shoulder internal rotation    Shoulder external rotation    Elbow flexion    Elbow extension    Wrist flexion    Wrist extension    Wrist ulnar deviation    Wrist radial deviation    Wrist pronation    Wrist supination    (Blank rows = not tested)  UPPER EXTREMITY MMT:  MMT Right 07/14/2022 Left 07/14/2022 Right 08/04/22  Shoulder flexion 4/5 c pain 5/5 4/5 with pain  Shoulder extension     Shoulder abduction 4/5 c pain 5/5 4/5  pain  Shoulder adduction     Shoulder internal rotation 5/5 5/5   Shoulder external rotation 4/5 c pain 5/5 4/5  pain  Middle trapezius     Lower trapezius     Elbow flexion     Elbow extension     Wrist flexion     Wrist extension     Wrist ulnar deviation     Wrist radial deviation     Wrist pronation     Wrist supination     Grip strength (lbs)     (Blank rows = not tested)  SHOULDER SPECIAL TESTS: 07/14/2022 (-) Rt empty can, painful arc  JOINT MOBILITY TESTING:  07/14/2022 No restriction  PALPATION:  07/14/2022 Trigger point  TODAY'S TREATMENT:                                                                                                                           DATE:08/04/2022 Therex: UBE L3 x 5  min forward ROM and MMT measurements -  see above Standing Rt shoulder flexion and abduction (AA concentric, A eccentric)  with 1# bar x 10 reps Rows L3 band 2x10; 5 sec hold Standing extension L3 band x 17 reps Sidelying Rt ER 1# 2x10 Sidelying Rt abduction 1# 2x10 Barrel hug stretch 5 x 10 sec hold   TODAY'S TREATMENT:                                                                                                                           DATE:08/02/2022 Therex: UBE L3 x 5 min forward Rows L3 band 2x10; 5 sec hold Standing IR 2 x 10; L3 band Standing ER partial range L3 band 2 x 10 Standing Rt shoulder flexion x 15 reps Standing Rt scaption 2 x 10 Wall ladder flexion and scaption x 10 reps each; Rt   TODAY'S TREATMENT:                                                                                                                           DATE:07/30/2022 Therex: Rows L3 band 2x10; 5 sec hold Standing extension L3 band 2x10; 5 sec hold Standing ER partial range L2 band 2x10 UBE L1 x 5 min forward Supine shoulder flexion 1# 2x10; Right Supine rhythmic stabilization circles 1# x15 reps CW/CCW circles Posterior capsule stretch on Rt 3x15 sec hold   PATIENT EDUCATION: 07/14/2022 Education details: HEP, POC Person educated: Patient Education method: Consulting civil engineer, Media planner, Verbal cues, and Handouts Education comprehension: verbalized understanding, returned demonstration, and verbal cues required  HOME EXERCISE PROGRAM: Access Code: [email or web address] URL: [email or web address] Date: 07/14/2022 Prepared by: Scot Jun  Exercises - Seated Scapular Retraction  - 3-5 x daily - 7 x weekly - 1 sets - 10 reps - 3-5 hold - Standing Isometric Shoulder External Rotation with Doorway (Mirrored)  - 2 x daily - 7 x weekly - 1 sets - 10 reps - 5 hold - Standing Shoulder Posterior Capsule Stretch (Mirrored)  - 2 x daily - 7 x weekly - 1 sets - 3-5 reps - 15 hold - Seated 55  Gallon Barrel Hug Stretch  - 2 x daily - 7 x weekly - 1 sets - 10 reps - 5 hold  ASSESSMENT:  CLINICAL IMPRESSION: Pt tolerated session well today with continued focus in strengthening.  Will continue to benefit from PT to maximize function.  No change in strength at this time.    OBJECTIVE IMPAIRMENTS: decreased activity tolerance, decreased coordination, decreased endurance, decreased strength, increased fascial restrictions, impaired perceived functional ability, improper body  mechanics, and pain.   ACTIVITY LIMITATIONS: carrying, lifting, reach over head, and hygiene/grooming  PARTICIPATION LIMITATIONS: meal prep, cleaning, laundry, interpersonal relationship, and community activity  PERSONAL FACTORS: no specific factors are affecting patient's functional outcome.   REHAB POTENTIAL: Good  CLINICAL DECISION MAKING: Stable/uncomplicated  EVALUATION COMPLEXITY: Low   GOALS: Goals reviewed with patient? Yes  SHORT TERM GOALS: (target date for Short term goals are 3 weeks 08/03/2022)  1.Patient will demonstrate independent use of home exercise program to maintain progress from in clinic treatments. Goal status: New  LONG TERM GOALS: (target dates for all long term goals are 10 weeks 09/21/2022 )   1. Patient will demonstrate/report pain at worst less than or equal to 2/10 to facilitate minimal limitation in daily activity secondary to pain symptoms. Goal status: New   2. Patient will demonstrate independent use of home exercise program to facilitate ability to maintain/progress functional gains from skilled physical therapy services. Goal status: New   3. Patient will demonstrate FOTO outcome > or = 62 % to indicate reduced disability due to condition. Goal status: New   4.  Patient will demonstrate Rt UE MMT 5/5 throughout to facilitate lifting, reaching, carrying at [facility] in daily activity.   Goal status: New   5.  Patient will demonstrate Rt GH joint AROM WFL s symptoms  to facilitate usual overhead reaching, self care, dressing at PLOF.    Goal status: New   6.  Patient will demonstrate/report ability to sleep s restriction. Goal status: New   PLAN:  PT FREQUENCY: 1-2x/week  PT DURATION: 10 weeks  PLANNED INTERVENTIONS: Therapeutic exercises, Therapeutic activity, Neuro Muscular re-education, Balance training, Gait training, Patient/Family education, Joint mobilization, Stair training, DME instructions, Dry Needling, Electrical stimulation, Traction, Cryotherapy, vasopneumatic device Moist heat, Taping, Ultrasound, Ionotophoresis '4mg'$ /ml Dexamethasone, and Manual therapy.  All included unless contraindicated  PLAN FOR NEXT SESSION:  Early strengthening for ER and scapular strength, monitor BP PRN    Laureen Abrahams, PT, DPT 08/04/22 11:02 AM

## 2022-08-11 ENCOUNTER — Encounter: Payer: Self-pay | Admitting: Physical Therapy

## 2022-08-11 ENCOUNTER — Ambulatory Visit (INDEPENDENT_AMBULATORY_CARE_PROVIDER_SITE_OTHER): Payer: Medicare Other | Admitting: Physical Therapy

## 2022-08-11 DIAGNOSIS — M25511 Pain in right shoulder: Secondary | ICD-10-CM | POA: Diagnosis not present

## 2022-08-11 DIAGNOSIS — M6281 Muscle weakness (generalized): Secondary | ICD-10-CM

## 2022-08-11 DIAGNOSIS — G8929 Other chronic pain: Secondary | ICD-10-CM | POA: Diagnosis not present

## 2022-08-11 DIAGNOSIS — Z1231 Encounter for screening mammogram for malignant neoplasm of breast: Secondary | ICD-10-CM | POA: Diagnosis not present

## 2022-08-11 NOTE — Therapy (Signed)
OUTPATIENT PHYSICAL THERAPY TREATMENT NOTE   Patient Name: Cassidy Garcia MRN: [id number] DOB:1946-07-31, 76 y.o., female Today's Date: 08/11/2022  END OF SESSION:   PT End of Session - 08/11/22 1146     Visit Number 6    Number of Visits 20    Date for PT Re-Evaluation 09/22/22    Authorization Type Medicare and [place]    Progress Note Due on Visit 10    PT Start Time 7564    PT Stop Time 1218    PT Time Calculation (min) 33 min    Activity Tolerance Patient tolerated treatment well    Behavior During Therapy WFL for tasks assessed/performed                 Past Medical History:  Diagnosis Date   Arthritis    Asthma    Bronchitis 03/2016   Chronic kidney disease    stage 3   Colon polyps    tubular adenoma   Constipation    Depression    Diverticulosis    GERD (gastroesophageal reflux disease)    Hemorrhoids    Hyperlipidemia    Hypertension    Hypothyroidism    Shortness of breath    with exertion on occasion   Sinusitis    Skin cancer    Past Surgical History:  Procedure Laterality Date   ABDOMINAL HYSTERECTOMY     CARPOMETACARPAL ([place]) FUSION OF THUMB Bilateral    CATARACT EXTRACTION, BILATERAL     CHOLECYSTECTOMY     COLONOSCOPY  02/12/2011   HAMMER TOE SURGERY Right    LAPAROSCOPIC APPENDECTOMY N/A 08/26/2021   Procedure: APPENDECTOMY LAPAROSCOPIC WITH LYSIS OF ADHESIONS;  Surgeon: Michael Boston, MD;  Location: [facility];  Service: General;  Laterality: N/A;   NASAL SINUS SURGERY     POLYPECTOMY     TOTAL HIP ARTHROPLASTY  12/10/2011   Procedure: TOTAL HIP ARTHROPLASTY ANTERIOR APPROACH;  Surgeon: Mcarthur Rossetti, MD;  Location: [facility];  Service: Orthopedics;  Laterality: Right;  Right Total Hip Arthroplasty, Anterior Approach   Patient Active Problem List   Diagnosis Date Noted   Rotator cuff tendonitis, right 07/02/2022   Nausea 12/02/2021   Bright red rectal bleeding 09/15/2021   History of colonic polyps 07/13/2021    Gastroenteritis 09/09/2020   Anemia 03/06/2020   Hypothyroidism 02/04/2020   Depression 01/24/2020   Asthma 01/24/2020   CKD (chronic kidney disease), stage III ([place]) 01/24/2020   Acute on chronic renal failure (HCC) 01/24/2020   Constipation 12/20/2018   Pain in right hip 06/29/2017   Trochanteric bursitis, right hip 06/29/2017   History of total replacement of right hip 06/29/2017   Hemorrhoids 03/01/2017   Great toe pain, left 12/04/2016   Chronic right shoulder pain 12/04/2016   Non-toxic multinodular goiter 05/14/2013   Degenerative arthritis of hip 12/10/2011   HTN (hypertension) 07/06/2011   Hyperlipidemia 07/06/2011   Gastro-esophageal reflux disease without esophagitis 09/23/2009     THERAPY DIAG:  Chronic right shoulder pain  Muscle weakness (generalized)   PCP: Burnard Bunting MD  REFERRING PROVIDER: Persons, Bevely Palmer, [place]  REFERRING DIAG: [phone] (ICD-10-CM) - Chronic right shoulder pain  EVAL THERAPY DIAG:  Chronic right shoulder pain  Muscle weakness (generalized)  Rationale for Evaluation and Treatment: Rehabilitation  ONSET DATE: Sept 2023  SUBJECTIVE:  SUBJECTIVE STATEMENT: "My shoulder is killing me."  Worse after last session, up to 7/10 when trying to sleep.  Hasn't rested as much but doing usual activities.  PERTINENT HISTORY: CKD, history of chronic back pain.  History of physical therapy for back/hip.   PAIN:  NPRS scale:3-4 at current /10, up to 8-9/10 Pain location: Rt shoulder  (anterior, upper arm, back of shoulder) Pain description: stabbing at times,  Aggravating factors: lifting items, carrying Relieving factors: Voltaren gel, patches, tylenol  PRECAUTIONS: None  WEIGHT BEARING RESTRICTIONS: No  FALLS:  Has patient fallen in last 6 months?  No  LIVING ENVIRONMENT: Lives in: House/apartment Stairs: has stairs at home   OCCUPATION: No work  PLOF: Independent, Rt hand dominant, read, puzzles, household activity, no yard work.   PATIENT GOALS:Reduce pain, being able to lift  Next MD visit:  none listed  OBJECTIVE:   PATIENT SURVEYS:  07/14/2022 FOTO intake:  54   predicted:  62  COGNITION: 07/14/2022 Overall cognitive status: WFL     SENSATION: 07/14/2022 WFL  POSTURE: 07/14/2022 Mild scapular protraction/FHP  UPPER EXTREMITY ROM:   07/14/2022:  Against gravity end range pain Rt shoulder in flexion/abduction, WFL.No limits in supine and WFL in all directions.    ROM Right 07/14/2022 Left 07/14/2022  Shoulder flexion 145 (sitting)   Shoulder extension    Shoulder abduction 120 (sitting)   Shoulder adduction    Shoulder internal rotation    Shoulder external rotation    (Blank rows = not tested)  UPPER EXTREMITY MMT:  MMT Right 07/14/2022 Left 07/14/2022 Right 08/04/22  Shoulder flexion 4/5 c pain 5/5 4/5 with pain  Shoulder extension     Shoulder abduction 4/5 c pain 5/5 4/5  pain  Shoulder adduction     Shoulder internal rotation 5/5 5/5   Shoulder external rotation 4/5 c pain 5/5 4/5  pain  Middle trapezius     Lower trapezius     (Blank rows = not tested)  SHOULDER SPECIAL TESTS: 07/14/2022 (-) Rt empty can, painful arc  JOINT MOBILITY TESTING:  07/14/2022 No restriction  PALPATION:  07/14/2022 Trigger point  TODAY'S TREATMENT:                                                                                                                           DATE:08/11/2022 Therex: Barrel hug stretch 5 x 10 sec hold Neck circles circles x 10 reps Shoulder rolls backwards x 10 reps Scapular retraction 5 sec hold x 15 reps Supine chest press with 1# bar x 20 reps Supine AA flexion with 1# bar x20 reps Supine isometric ER with Lt hand providing resistance x 20 reps Supine AA ER with  1# bar x 20 reps UE ranger flexion, scaption x 20 reps Rows L2 band x 20 reps    TODAY'S TREATMENT:  DATE:08/04/2022 Therex: UBE L3 x 5 min forward ROM and MMT measurements -  see above Standing Rt shoulder flexion and abduction (AA concentric, A eccentric) with 1# bar x 10 reps Rows L3 band 2x10; 5 sec hold Standing extension L3 band x 17 reps Sidelying Rt ER 1# 2x10 Sidelying Rt abduction 1# 2x10 Barrel hug stretch 5 x 10 sec hold   TODAY'S TREATMENT:                                                                                                                           DATE:08/02/2022 Therex: UBE L3 x 5 min forward Rows L3 band 2x10; 5 sec hold Standing IR 2 x 10; L3 band Standing ER partial range L3 band 2 x 10 Standing Rt shoulder flexion x 15 reps Standing Rt scaption 2 x 10 Wall ladder flexion and scaption x 10 reps each; Rt   TODAY'S TREATMENT:                                                                                                                           DATE:07/30/2022 Therex: Rows L3 band 2x10; 5 sec hold Standing extension L3 band 2x10; 5 sec hold Standing ER partial range L2 band 2x10 UBE L1 x 5 min forward Supine shoulder flexion 1# 2x10; Right Supine rhythmic stabilization circles 1# x15 reps CW/CCW circles Posterior capsule stretch on Rt 3x15 sec hold   PATIENT EDUCATION: 07/14/2022 Education details: HEP, POC Person educated: Patient Education method: Consulting civil engineer, Media planner, Verbal cues, and Handouts Education comprehension: verbalized understanding, returned demonstration, and verbal cues required  HOME EXERCISE PROGRAM: Access Code: [email or web address] URL: [email or web address] Date: 07/14/2022 Prepared by: Scot Jun  Exercises - Seated Scapular Retraction  - 3-5 x daily - 7 x weekly - 1 sets - 10 reps  - 3-5 hold - Standing Isometric Shoulder External Rotation with Doorway (Mirrored)  - 2 x daily - 7 x weekly - 1 sets - 10 reps - 5 hold - Standing Shoulder Posterior Capsule Stretch (Mirrored)  - 2 x daily - 7 x weekly - 1 sets - 3-5 reps - 15 hold - Seated 55 Gallon Barrel Hug Stretch  - 2 x daily - 7 x weekly - 1 sets - 10 reps - 5 hold  ASSESSMENT:  CLINICAL IMPRESSION: Pt tolerated session well today without increase in pain, but did need to scale back exercises due to elevated pain.  She's also getting a  mammogram today with arm needing to be in sustained overhead position so did not do anything to aggravate the shoulder.  She did report decreased pain at end of session.  OBJECTIVE IMPAIRMENTS: decreased activity tolerance, decreased coordination, decreased endurance, decreased strength, increased fascial restrictions, impaired perceived functional ability, improper body mechanics, and pain.   ACTIVITY LIMITATIONS: carrying, lifting, reach over head, and hygiene/grooming  PARTICIPATION LIMITATIONS: meal prep, cleaning, laundry, interpersonal relationship, and community activity  PERSONAL FACTORS: no specific factors are affecting patient's functional outcome.   REHAB POTENTIAL: Good  CLINICAL DECISION MAKING: Stable/uncomplicated  EVALUATION COMPLEXITY: Low   GOALS: Goals reviewed with patient? Yes  SHORT TERM GOALS: (target date for Short term goals are 3 weeks 08/03/2022)  1.Patient will demonstrate independent use of home exercise program to maintain progress from in clinic treatments. Goal status: New  LONG TERM GOALS: (target dates for all long term goals are 10 weeks 09/21/2022 )   1. Patient will demonstrate/report pain at worst less than or equal to 2/10 to facilitate minimal limitation in daily activity secondary to pain symptoms. Goal status: New   2. Patient will demonstrate independent use of home exercise program to facilitate ability to maintain/progress  functional gains from skilled physical therapy services. Goal status: New   3. Patient will demonstrate FOTO outcome > or = 62 % to indicate reduced disability due to condition. Goal status: New   4.  Patient will demonstrate Rt UE MMT 5/5 throughout to facilitate lifting, reaching, carrying at [facility] in daily activity.   Goal status: New   5.  Patient will demonstrate Rt GH joint AROM WFL s symptoms to facilitate usual overhead reaching, self care, dressing at PLOF.    Goal status: New   6.  Patient will demonstrate/report ability to sleep s restriction. Goal status: New   PLAN:  PT FREQUENCY: 1-2x/week  PT DURATION: 10 weeks  PLANNED INTERVENTIONS: Therapeutic exercises, Therapeutic activity, Neuro Muscular re-education, Balance training, Gait training, Patient/Family education, Joint mobilization, Stair training, DME instructions, Dry Needling, Electrical stimulation, Traction, Cryotherapy, vasopneumatic device Moist heat, Taping, Ultrasound, Ionotophoresis '4mg'$ /ml Dexamethasone, and Manual therapy.  All included unless contraindicated  PLAN FOR NEXT SESSION:  resume strengthening PRN, Early strengthening for ER and scapular strength, monitor BP PRN    Laureen Abrahams, PT, DPT 08/11/22 12:21 PM

## 2022-08-17 ENCOUNTER — Ambulatory Visit (INDEPENDENT_AMBULATORY_CARE_PROVIDER_SITE_OTHER): Payer: Medicare Other | Admitting: Rehabilitative and Restorative Service Providers"

## 2022-08-17 ENCOUNTER — Encounter: Payer: Self-pay | Admitting: Rehabilitative and Restorative Service Providers"

## 2022-08-17 DIAGNOSIS — M25511 Pain in right shoulder: Secondary | ICD-10-CM | POA: Diagnosis not present

## 2022-08-17 DIAGNOSIS — M6281 Muscle weakness (generalized): Secondary | ICD-10-CM | POA: Diagnosis not present

## 2022-08-17 DIAGNOSIS — G8929 Other chronic pain: Secondary | ICD-10-CM | POA: Diagnosis not present

## 2022-08-17 NOTE — Therapy (Signed)
OUTPATIENT PHYSICAL THERAPY TREATMENT NOTE   Patient Name: ROSELA SUPAK MRN: [id number] DOB:07-Dec-1945, 77 y.o., female Today's Date: 08/17/2022  END OF SESSION:   PT End of Session - 08/17/22 1516     Visit Number 7    Number of Visits 20    Date for PT Re-Evaluation 09/22/22    Authorization Type Medicare and [employer]    Progress Note Due on Visit 10    PT Start Time 1513    PT Stop Time 1557    PT Time Calculation (min) 44 min    Activity Tolerance Patient limited by pain    Behavior During Therapy [facility] for tasks assessed/performed                  Past Medical History:  Diagnosis Date   Arthritis    Asthma    Bronchitis 03/2016   Chronic kidney disease    stage 3   Colon polyps    tubular adenoma   Constipation    Depression    Diverticulosis    GERD (gastroesophageal reflux disease)    Hemorrhoids    Hyperlipidemia    Hypertension    Hypothyroidism    Shortness of breath    with exertion on occasion   Sinusitis    Skin cancer    Past Surgical History:  Procedure Laterality Date   ABDOMINAL HYSTERECTOMY     CARPOMETACARPAL ([place]) FUSION OF THUMB Bilateral    CATARACT EXTRACTION, BILATERAL     CHOLECYSTECTOMY     COLONOSCOPY  02/12/2011   HAMMER TOE SURGERY Right    LAPAROSCOPIC APPENDECTOMY N/A 08/26/2021   Procedure: APPENDECTOMY LAPAROSCOPIC WITH LYSIS OF ADHESIONS;  Surgeon: Shyquan Stallbaumer Boston, MD;  Location: [facility];  Service: General;  Laterality: N/A;   NASAL SINUS SURGERY     POLYPECTOMY     TOTAL HIP ARTHROPLASTY  12/10/2011   Procedure: TOTAL HIP ARTHROPLASTY ANTERIOR APPROACH;  Surgeon: Mcarthur Rossetti, MD;  Location: [facility];  Service: Orthopedics;  Laterality: Right;  Right Total Hip Arthroplasty, Anterior Approach   Patient Active Problem List   Diagnosis Date Noted   Rotator cuff tendonitis, right 07/02/2022   Nausea 12/02/2021   Bright red rectal bleeding 09/15/2021   History of colonic polyps 07/13/2021   Gastroenteritis  09/09/2020   Anemia 03/06/2020   Hypothyroidism 02/04/2020   Depression 01/24/2020   Asthma 01/24/2020   CKD (chronic kidney disease), stage III ([place]) 01/24/2020   Acute on chronic renal failure (HCC) 01/24/2020   Constipation 12/20/2018   Pain in right hip 06/29/2017   Trochanteric bursitis, right hip 06/29/2017   History of total replacement of right hip 06/29/2017   Hemorrhoids 03/01/2017   Great toe pain, left 12/04/2016   Chronic right shoulder pain 12/04/2016   Non-toxic multinodular goiter 05/14/2013   Degenerative arthritis of hip 12/10/2011   HTN (hypertension) 07/06/2011   Hyperlipidemia 07/06/2011   Gastro-esophageal reflux disease without esophagitis 09/23/2009     THERAPY DIAG:  Chronic right shoulder pain  Muscle weakness (generalized)   PCP: Burnard Bunting MD  REFERRING PROVIDER: Persons, Bevely Palmer, [place]  REFERRING DIAG: [phone] (ICD-10-CM) - Chronic right shoulder pain  EVAL THERAPY DIAG:  Chronic right shoulder pain  Muscle weakness (generalized)  Rationale for Evaluation and Treatment: Rehabilitation  ONSET DATE: Sept 2023  SUBJECTIVE:  SUBJECTIVE STATEMENT: Pt indicated two occurrences recently of sharp pain while trying to reach and grab items, primarily in underhand reach.  Pain was in upper lateral arm initially but now more sore across front.   PERTINENT HISTORY: CKD, history of chronic back pain.  History of physical therapy for back/hip.   PAIN:  NPRS scale: at rest 5/10.   Pain location: Rt shoulder  (anterior, upper arm, back of shoulder) Pain description: stabbing at times,  Aggravating factors: lifting items, carrying Relieving factors: Voltaren gel, patches, tylenol  PRECAUTIONS: None  WEIGHT BEARING RESTRICTIONS: No  FALLS:  Has patient  fallen in last 6 months? No  LIVING ENVIRONMENT: Lives in: House/apartment Stairs: has stairs at home   OCCUPATION: No work  PLOF: Independent, Rt hand dominant, read, puzzles, household activity, no yard work.   PATIENT GOALS:Reduce pain, being able to lift  Next MD visit:  none listed  OBJECTIVE:   PATIENT SURVEYS:  07/14/2022 FOTO intake:  54   predicted:  62  COGNITION: 07/14/2022 Overall cognitive status: WFL     SENSATION: 07/14/2022 WFL  POSTURE: 07/14/2022 Mild scapular protraction/FHP  UPPER EXTREMITY ROM:   07/14/2022:  Against gravity end range pain Rt shoulder in flexion/abduction, WFL.No limits in supine and WFL in all directions.    ROM Right 07/14/2022 Left 07/14/2022 Right 08/17/2022(post new pain onset)  Shoulder flexion 145 (sitting)  80 AROM against gravity 95 AROM in supine  Shoulder extension     Shoulder abduction 120 (sitting)    Shoulder adduction     Shoulder internal rotation     Shoulder external rotation     (Blank rows = not tested)  UPPER EXTREMITY MMT:  MMT Right 07/14/2022 Left 07/14/2022 Right 08/04/22  Shoulder flexion 4/5 c pain 5/5 4/5 with pain  Shoulder extension     Shoulder abduction 4/5 c pain 5/5 4/5  pain  Shoulder adduction     Shoulder internal rotation 5/5 5/5   Shoulder external rotation 4/5 c pain 5/5 4/5  pain  Middle trapezius     Lower trapezius     (Blank rows = not tested)  SHOULDER SPECIAL TESTS: 08/17/2022:  negative popeye's Rt.  Pain noted c biceps loading testing, Mild shrug sign noted.   07/14/2022 (-) Rt empty can, painful arc  JOINT MOBILITY TESTING:  07/14/2022 No restriction  PALPATION:  07/14/2022 Trigger point  TODAY'S TREATMENT:                                                                                                                          DATE:08/17/2022 Therex: Supine passive Rt shoulder flexion c Lt arm x 10 Supine wand flexion AAROM 1 lb wand x 10 Supine wand  serratus press 3 sec hold x 10 1 lb wand   Review and adjustments of HEP to adapt to symptoms.   Estim IFC to Rt shoulder 10 mins for pain reduction to tolerance level. No adverse reactions.   TODAY'S TREATMENT:  DATE:08/11/2022 Therex: Barrel hug stretch 5 x 10 sec hold Neck circles circles x 10 reps Shoulder rolls backwards x 10 reps Scapular retraction 5 sec hold x 15 reps Supine chest press with 1# bar x 20 reps Supine AA flexion with 1# bar x20 reps Supine isometric ER with Lt hand providing resistance x 20 reps Supine AA ER with 1# bar x 20 reps UE ranger flexion, scaption x 20 reps Rows L2 band x 20 reps    TODAY'S TREATMENT:                                                                                                                           DATE:08/04/2022 Therex: UBE L3 x 5 min forward ROM and MMT measurements -  see above Standing Rt shoulder flexion and abduction (AA concentric, A eccentric) with 1# bar x 10 reps Rows L3 band 2x10; 5 sec hold Standing extension L3 band x 17 reps Sidelying Rt ER 1# 2x10 Sidelying Rt abduction 1# 2x10 Barrel hug stretch 5 x 10 sec hold    PATIENT EDUCATION: 08/17/2022 Education details: HEP update Person educated: Patient Education method: Consulting civil engineer, Demonstration, Verbal cues, and Handouts Education comprehension: verbalized understanding, returned demonstration, and verbal cues required  HOME EXERCISE PROGRAM: Access Code: [email or web address] URL: [email or web address] Date: 08/17/2022 Prepared by: Scot Jun  Exercises - Seated Scapular Retraction  - 3-5 x daily - 7 x weekly - 1 sets - 10 reps - 3-5 hold - Standing Isometric Shoulder External Rotation with Doorway (Mirrored)  - 2 x daily - 7 x weekly - 1 sets - 10 reps - 5 hold - Standing Shoulder Posterior Capsule Stretch (Mirrored)  - 2 x  daily - 7 x weekly - 1 sets - 3-5 reps - 15 hold - Seated 55 Gallon Barrel Hug Stretch  - 2 x daily - 7 x weekly - 1 sets - 10 reps - 5 hold - Supine Shoulder Flexion PROM (Mirrored)  - 2-3 x daily - 7 x weekly - 1-2 sets - 10 reps - 5 hold - Supine Shoulder Flexion Extension AAROM with [place]el  - 2-3 x daily - 7 x weekly - 1-2 sets - 10-15 reps - 3 hold - Supine Scapular Protraction in Flexion with Dumbbells  - 1-2 x daily - 7 x weekly - 1 sets - 10 reps - 3 hold - Standing Isometric Shoulder Internal Rotation at Doorway  - 1-2 x daily - 7 x weekly - 1 sets - 15 reps - 5 hold - Standing Isometric Shoulder Flexion with Doorway - Arm Bent  - 1-2 x daily - 7 x weekly - 1 sets - 10 reps - 5-10 hold  ASSESSMENT:  CLINICAL IMPRESSION: Arrival today with exacerbated symptoms after 2 instances of reaching c sharp pain.  Presentation today showed reduced AROM c contractile tissue main component of pain.  Attempts to adapt HEP to reduce severity of symptom response. Plan to reassess again in future.   OBJECTIVE  IMPAIRMENTS: decreased activity tolerance, decreased coordination, decreased endurance, decreased strength, increased fascial restrictions, impaired perceived functional ability, improper body mechanics, and pain.   ACTIVITY LIMITATIONS: carrying, lifting, reach over head, and hygiene/grooming  PARTICIPATION LIMITATIONS: meal prep, cleaning, laundry, interpersonal relationship, and community activity  PERSONAL FACTORS: no specific factors are affecting patient's functional outcome.   REHAB POTENTIAL: Good  CLINICAL DECISION MAKING: Stable/uncomplicated  EVALUATION COMPLEXITY: Low   GOALS: Goals reviewed with patient? Yes  SHORT TERM GOALS: (target date for Short term goals are 3 weeks 08/03/2022)  1.Patient will demonstrate independent use of home exercise program to maintain progress from in clinic treatments. Goal status: partialy met  LONG TERM GOALS: (target dates for all long  term goals are 10 weeks 09/21/2022 )   1. Patient will demonstrate/report pain at worst less than or equal to 2/10 to facilitate minimal limitation in daily activity secondary to pain symptoms. Goal status: on going 08/17/2022   2. Patient will demonstrate independent use of home exercise program to facilitate ability to maintain/progress functional gains from skilled physical therapy services. Goal status: on going 08/17/2022   3. Patient will demonstrate FOTO outcome > or = 62 % to indicate reduced disability due to condition. Goal status: on going 08/17/2022   4.  Patient will demonstrate Rt UE MMT 5/5 throughout to facilitate lifting, reaching, carrying at [facility] in daily activity.   Goal status: on going 08/17/2022   5.  Patient will demonstrate Rt GH joint AROM WFL s symptoms to facilitate usual overhead reaching, self care, dressing at PLOF.    Goal status: on going 08/17/2022   6.  Patient will demonstrate/report ability to sleep s restriction. Goal status: on going 08/17/2022   PLAN:  PT FREQUENCY: 1-2x/week  PT DURATION: 10 weeks  PLANNED INTERVENTIONS: Therapeutic exercises, Therapeutic activity, Neuro Muscular re-education, Balance training, Gait training, Patient/Family education, Joint mobilization, Stair training, DME instructions, Dry Needling, Electrical stimulation, Traction, Cryotherapy, vasopneumatic device Moist heat, Taping, Ultrasound, Ionotophoresis 63m/ml Dexamethasone, and Manual therapy.  All included unless contraindicated  PLAN FOR NEXT SESSION:  Recheck presentation, estim as necessary.   MScot Jun PT, DPT, OCS, ATC 08/17/22  4:11 PM

## 2022-08-19 ENCOUNTER — Ambulatory Visit (INDEPENDENT_AMBULATORY_CARE_PROVIDER_SITE_OTHER): Payer: Medicare Other | Admitting: Rehabilitative and Restorative Service Providers"

## 2022-08-19 ENCOUNTER — Encounter: Payer: Self-pay | Admitting: Rehabilitative and Restorative Service Providers"

## 2022-08-19 DIAGNOSIS — M6281 Muscle weakness (generalized): Secondary | ICD-10-CM

## 2022-08-19 DIAGNOSIS — G8929 Other chronic pain: Secondary | ICD-10-CM | POA: Diagnosis not present

## 2022-08-19 DIAGNOSIS — M25511 Pain in right shoulder: Secondary | ICD-10-CM | POA: Diagnosis not present

## 2022-08-19 NOTE — Therapy (Signed)
OUTPATIENT PHYSICAL THERAPY TREATMENT NOTE   Patient Name: Cassidy Garcia MRN: [id number] DOB:07/10/1946, 77 y.o., female Today's Date: 08/19/2022  END OF SESSION:   PT End of Session - 08/19/22 1517     Visit Number 8    Number of Visits 20    Date for PT Re-Evaluation 09/22/22    Authorization Type Medicare and [place]    Progress Note Due on Visit 10    PT Start Time 1520    PT Stop Time 1600    PT Time Calculation (min) 40 min    Activity Tolerance Patient tolerated treatment well    Behavior During Therapy WFL for tasks assessed/performed                   Past Medical History:  Diagnosis Date   Arthritis    Asthma    Bronchitis 03/2016   Chronic kidney disease    stage 3   Colon polyps    tubular adenoma   Constipation    Depression    Diverticulosis    GERD (gastroesophageal reflux disease)    Hemorrhoids    Hyperlipidemia    Hypertension    Hypothyroidism    Shortness of breath    with exertion on occasion   Sinusitis    Skin cancer    Past Surgical History:  Procedure Laterality Date   ABDOMINAL HYSTERECTOMY     CARPOMETACARPAL ([place]) FUSION OF THUMB Bilateral    CATARACT EXTRACTION, BILATERAL     CHOLECYSTECTOMY     COLONOSCOPY  02/12/2011   HAMMER TOE SURGERY Right    LAPAROSCOPIC APPENDECTOMY N/A 08/26/2021   Procedure: APPENDECTOMY LAPAROSCOPIC WITH LYSIS OF ADHESIONS;  Surgeon: Jeriann Sayres Boston, MD;  Location: [facility];  Service: General;  Laterality: N/A;   NASAL SINUS SURGERY     POLYPECTOMY     TOTAL HIP ARTHROPLASTY  12/10/2011   Procedure: TOTAL HIP ARTHROPLASTY ANTERIOR APPROACH;  Surgeon: Mcarthur Rossetti, MD;  Location: [facility];  Service: Orthopedics;  Laterality: Right;  Right Total Hip Arthroplasty, Anterior Approach   Patient Active Problem List   Diagnosis Date Noted   Rotator cuff tendonitis, right 07/02/2022   Nausea 12/02/2021   Bright red rectal bleeding 09/15/2021   History of colonic polyps 07/13/2021    Gastroenteritis 09/09/2020   Anemia 03/06/2020   Hypothyroidism 02/04/2020   Depression 01/24/2020   Asthma 01/24/2020   CKD (chronic kidney disease), stage III ([place]) 01/24/2020   Acute on chronic renal failure (HCC) 01/24/2020   Constipation 12/20/2018   Pain in right hip 06/29/2017   Trochanteric bursitis, right hip 06/29/2017   History of total replacement of right hip 06/29/2017   Hemorrhoids 03/01/2017   Great toe pain, left 12/04/2016   Chronic right shoulder pain 12/04/2016   Non-toxic multinodular goiter 05/14/2013   Degenerative arthritis of hip 12/10/2011   HTN (hypertension) 07/06/2011   Hyperlipidemia 07/06/2011   Gastro-esophageal reflux disease without esophagitis 09/23/2009     THERAPY DIAG:  Chronic right shoulder pain  Muscle weakness (generalized)   PCP: Burnard Bunting MD  REFERRING PROVIDER: Persons, Bevely Palmer, [place]  REFERRING DIAG: [phone] (ICD-10-CM) - Chronic right shoulder pain  EVAL THERAPY DIAG:  Chronic right shoulder pain  Muscle weakness (generalized)  Rationale for Evaluation and Treatment: Rehabilitation  ONSET DATE: Sept 2023  SUBJECTIVE:  SUBJECTIVE STATEMENT: She indicated she "is about back to before the two episodes of straining".  She said she was still sore but able to use it more.    PERTINENT HISTORY: CKD, history of chronic back pain.  History of physical therapy for back/hip.   PAIN:  NPRS scale: at worst today 3-4/10 , 0/10 at current   Pain location: Rt shoulder   Pain description: stabbing at times,  Aggravating factors: lifting items, carrying Relieving factors: Voltaren gel, patches, tylenol  PRECAUTIONS: None  WEIGHT BEARING RESTRICTIONS: No  FALLS:  Has patient fallen in last 6 months? No  LIVING ENVIRONMENT: Lives in:  House/apartment Stairs: has stairs at home   OCCUPATION: No work  PLOF: Independent, Rt hand dominant, read, puzzles, household activity, no yard work.   PATIENT GOALS:Reduce pain, being able to lift  Next MD visit:  none listed  OBJECTIVE:   PATIENT SURVEYS:  07/14/2022 FOTO intake:  54   predicted:  62  COGNITION: 07/14/2022 Overall cognitive status: WFL     SENSATION: 07/14/2022 WFL  POSTURE: 07/14/2022 Mild scapular protraction/FHP  UPPER EXTREMITY ROM:   07/14/2022:  Against gravity end range pain Rt shoulder in flexion/abduction, WFL.No limits in supine and WFL in all directions.    ROM Right 07/14/2022 Left 07/14/2022 Right 08/17/2022(post new pain onset) Right 08/19/2022  Shoulder flexion 145 (sitting)  80 AROM against gravity 95 AROM in supine 160 AROM in supine  Shoulder extension      Shoulder abduction 120 (sitting)     Shoulder adduction      Shoulder internal rotation      Shoulder external rotation      (Blank rows = not tested)  UPPER EXTREMITY MMT:  MMT Right 07/14/2022 Left 07/14/2022 Right 08/04/22  Shoulder flexion 4/5 c pain 5/5 4/5 with pain  Shoulder extension     Shoulder abduction 4/5 c pain 5/5 4/5  pain  Shoulder adduction     Shoulder internal rotation 5/5 5/5   Shoulder external rotation 4/5 c pain 5/5 4/5  pain  Middle trapezius     Lower trapezius     (Blank rows = not tested)  SHOULDER SPECIAL TESTS: 08/17/2022:  negative popeye's Rt.  Pain noted c biceps loading testing, Mild shrug sign noted.   07/14/2022 (-) Rt empty can, painful arc  JOINT MOBILITY TESTING:  07/14/2022 No restriction  PALPATION:  07/14/2022 Trigger point  TODAY'S TREATMENT:                                                                                                                          DATE:08/19/2022 Therex: Supine wand flexion AAROM 2 lb wand x 15 Supine serratus press 3 sec hold x 10 2 lb Supine 90 deg flexion circles cw, ccw 2  lb weight 2 x 20 each direction  Supine AROM flexion x 15 Sidelying Rt shoulder AROM abduction 2 x 10 Sidelying ER AROM c towel under arm x 5 ( limited by  pain)  Estim IFC to Rt shoulder 10 mins for pain reduction to tolerance level. No adverse reactions.   TODAY'S TREATMENT:                                                                                                                          DATE:08/17/2022 Therex: Supine passive Rt shoulder flexion c Lt arm x 10 Supine wand flexion AAROM 1 lb wand x 10 Supine wand serratus press 3 sec hold x 10 1 lb wand   Review and adjustments of HEP to adapt to symptoms.   Estim IFC to Rt shoulder 10 mins for pain reduction to tolerance level. No adverse reactions.   TODAY'S TREATMENT:                                                                                                                           DATE:08/11/2022 Therex: Barrel hug stretch 5 x 10 sec hold Neck circles circles x 10 reps Shoulder rolls backwards x 10 reps Scapular retraction 5 sec hold x 15 reps Supine chest press with 1# bar x 20 reps Supine AA flexion with 1# bar x20 reps Supine isometric ER with Lt hand providing resistance x 20 reps Supine AA ER with 1# bar x 20 reps UE ranger flexion, scaption x 20 reps Rows L2 band x 20 reps    PATIENT EDUCATION: 08/17/2022 Education details: HEP update Person educated: Patient Education method: Consulting civil engineer, Demonstration, Verbal cues, and Handouts Education comprehension: verbalized understanding, returned demonstration, and verbal cues required  HOME EXERCISE PROGRAM: Access Code: [email or web address] URL: [email or web address] Date: 08/19/2022 Prepared by: Scot Jun  Exercises - Seated Scapular Retraction  - 3-5 x daily - 7 x weekly - 1 sets - 10 reps - 3-5 hold - Standing Isometric Shoulder External Rotation with Doorway (Mirrored)  - 2 x daily - 7 x weekly - 1 sets - 10 reps - 5 hold - Standing Shoulder  Posterior Capsule Stretch (Mirrored)  - 2 x daily - 7 x weekly - 1 sets - 3-5 reps - 15 hold - Seated 55 Gallon Barrel Hug Stretch  - 2 x daily - 7 x weekly - 1 sets - 10 reps - 5 hold - Supine Shoulder Flexion PROM (Mirrored)  - 2-3 x daily - 7 x weekly - 1-2 sets - 10 reps - 5 hold - Supine Shoulder Flexion Extension AAROM with Dowel  - 2-3 x daily - 7 x weekly -  1-2 sets - 10-15 reps - 3 hold - Supine Scapular Protraction in Flexion with Dumbbells  - 1-2 x daily - 7 x weekly - 1 sets - 10 reps - 3 hold - Standing Isometric Shoulder Internal Rotation at Doorway  - 1-2 x daily - 7 x weekly - 1 sets - 15 reps - 5 hold - Standing Isometric Shoulder Flexion with Doorway - Arm Bent  - 1-2 x daily - 7 x weekly - 1 sets - 10 reps - 5-10 hold  ASSESSMENT:  CLINICAL IMPRESSION: Improvement in symptoms allowed improvement in active mobility intervention today.  Pt to continue to benefit from skilled PT services to improve strength and mobility for daily life.   OBJECTIVE IMPAIRMENTS: decreased activity tolerance, decreased coordination, decreased endurance, decreased strength, increased fascial restrictions, impaired perceived functional ability, improper body mechanics, and pain.   ACTIVITY LIMITATIONS: carrying, lifting, reach over head, and hygiene/grooming  PARTICIPATION LIMITATIONS: meal prep, cleaning, laundry, interpersonal relationship, and community activity  PERSONAL FACTORS: no specific factors are affecting patient's functional outcome.   REHAB POTENTIAL: Good  CLINICAL DECISION MAKING: Stable/uncomplicated  EVALUATION COMPLEXITY: Low   GOALS: Goals reviewed with patient? Yes  SHORT TERM GOALS: (target date for Short term goals are 3 weeks 08/03/2022)  1.Patient will demonstrate independent use of home exercise program to maintain progress from in clinic treatments. Goal status: partialy met  LONG TERM GOALS: (target dates for all long term goals are 10 weeks 09/21/2022 )   1.  Patient will demonstrate/report pain at worst less than or equal to 2/10 to facilitate minimal limitation in daily activity secondary to pain symptoms. Goal status: on going 08/17/2022   2. Patient will demonstrate independent use of home exercise program to facilitate ability to maintain/progress functional gains from skilled physical therapy services. Goal status: on going 08/17/2022   3. Patient will demonstrate FOTO outcome > or = 62 % to indicate reduced disability due to condition. Goal status: on going 08/17/2022   4.  Patient will demonstrate Rt UE MMT 5/5 throughout to facilitate lifting, reaching, carrying at [facility] in daily activity.   Goal status: on going 08/17/2022   5.  Patient will demonstrate Rt GH joint AROM WFL s symptoms to facilitate usual overhead reaching, self care, dressing at PLOF.    Goal status: on going 08/17/2022   6.  Patient will demonstrate/report ability to sleep s restriction. Goal status: on going 08/17/2022   PLAN:  PT FREQUENCY: 1-2x/week  PT DURATION: 10 weeks  PLANNED INTERVENTIONS: Therapeutic exercises, Therapeutic activity, Neuro Muscular re-education, Balance training, Gait training, Patient/Family education, Joint mobilization, Stair training, DME instructions, Dry Needling, Electrical stimulation, Traction, Cryotherapy, vasopneumatic device Moist heat, Taping, Ultrasound, Ionotophoresis 28m/ml Dexamethasone, and Manual therapy.  All included unless contraindicated  PLAN FOR NEXT SESSION:  FOTO reassessment ( > 30 days) .  Progressive AROM and strength improvements to tolerance.    MScot Jun PT, DPT, OCS, ATC 08/19/22  3:51 PM

## 2022-08-23 ENCOUNTER — Ambulatory Visit (INDEPENDENT_AMBULATORY_CARE_PROVIDER_SITE_OTHER): Payer: Medicare Other | Admitting: Rehabilitative and Restorative Service Providers"

## 2022-08-23 ENCOUNTER — Encounter: Payer: Self-pay | Admitting: Rehabilitative and Restorative Service Providers"

## 2022-08-23 DIAGNOSIS — M6281 Muscle weakness (generalized): Secondary | ICD-10-CM | POA: Diagnosis not present

## 2022-08-23 DIAGNOSIS — G8929 Other chronic pain: Secondary | ICD-10-CM | POA: Diagnosis not present

## 2022-08-23 DIAGNOSIS — M25511 Pain in right shoulder: Secondary | ICD-10-CM

## 2022-08-23 NOTE — Therapy (Signed)
OUTPATIENT PHYSICAL THERAPY TREATMENT NOTE   Patient Name: Cassidy Garcia MRN: [id number] DOB:02-20-1946, 77 y.o., female Today's Date: 08/23/2022  END OF SESSION:   PT End of Session - 08/23/22 1518     Visit Number 9    Number of Visits 20    Date for PT Re-Evaluation 09/22/22    Authorization Type Medicare and [place]    Progress Note Due on Visit 10    PT Start Time 1516    PT Stop Time 1552    PT Time Calculation (min) 36 min    Activity Tolerance Patient limited by pain    Behavior During Therapy [facility] for tasks assessed/performed                    Past Medical History:  Diagnosis Date   Arthritis    Asthma    Bronchitis 03/2016   Chronic kidney disease    stage 3   Colon polyps    tubular adenoma   Constipation    Depression    Diverticulosis    GERD (gastroesophageal reflux disease)    Hemorrhoids    Hyperlipidemia    Hypertension    Hypothyroidism    Shortness of breath    with exertion on occasion   Sinusitis    Skin cancer    Past Surgical History:  Procedure Laterality Date   ABDOMINAL HYSTERECTOMY     CARPOMETACARPAL ([place]) FUSION OF THUMB Bilateral    CATARACT EXTRACTION, BILATERAL     CHOLECYSTECTOMY     COLONOSCOPY  02/12/2011   HAMMER TOE SURGERY Right    LAPAROSCOPIC APPENDECTOMY N/A 08/26/2021   Procedure: APPENDECTOMY LAPAROSCOPIC WITH LYSIS OF ADHESIONS;  Surgeon: Allycia Pitz Boston, MD;  Location: [facility];  Service: General;  Laterality: N/A;   NASAL SINUS SURGERY     POLYPECTOMY     TOTAL HIP ARTHROPLASTY  12/10/2011   Procedure: TOTAL HIP ARTHROPLASTY ANTERIOR APPROACH;  Surgeon: Mcarthur Rossetti, MD;  Location: [facility];  Service: Orthopedics;  Laterality: Right;  Right Total Hip Arthroplasty, Anterior Approach   Patient Active Problem List   Diagnosis Date Noted   Rotator cuff tendonitis, right 07/02/2022   Nausea 12/02/2021   Bright red rectal bleeding 09/15/2021   History of colonic polyps 07/13/2021    Gastroenteritis 09/09/2020   Anemia 03/06/2020   Hypothyroidism 02/04/2020   Depression 01/24/2020   Asthma 01/24/2020   CKD (chronic kidney disease), stage III ([place]) 01/24/2020   Acute on chronic renal failure (HCC) 01/24/2020   Constipation 12/20/2018   Pain in right hip 06/29/2017   Trochanteric bursitis, right hip 06/29/2017   History of total replacement of right hip 06/29/2017   Hemorrhoids 03/01/2017   Great toe pain, left 12/04/2016   Chronic right shoulder pain 12/04/2016   Non-toxic multinodular goiter 05/14/2013   Degenerative arthritis of hip 12/10/2011   HTN (hypertension) 07/06/2011   Hyperlipidemia 07/06/2011   Gastro-esophageal reflux disease without esophagitis 09/23/2009     THERAPY DIAG:  Chronic right shoulder pain  Muscle weakness (generalized)   PCP: Burnard Bunting MD  REFERRING PROVIDER: Persons, Bevely Palmer, [place]  REFERRING DIAG: [phone] (ICD-10-CM) - Chronic right shoulder pain  EVAL THERAPY DIAG:  Chronic right shoulder pain  Muscle weakness (generalized)  Rationale for Evaluation and Treatment: Rehabilitation  ONSET DATE: Sept 2023  SUBJECTIVE:  SUBJECTIVE STATEMENT: Pt indicated having a lot of pain c arm (anterior shoulder, upper arm).  Insidious worsening.   PERTINENT HISTORY: CKD, history of chronic back pain.  History of physical therapy for back/hip.   PAIN:  NPRS scale: current:   Pain location: Rt shoulder   Pain description: stabbing at times, constant yesterday  Aggravating factors: lifting items, carrying Relieving factors: Voltaren gel, patches, tylenol  PRECAUTIONS: None  WEIGHT BEARING RESTRICTIONS: No  FALLS:  Has patient fallen in last 6 months? No  LIVING ENVIRONMENT: Lives in: House/apartment Stairs: has stairs at  home   OCCUPATION: No work  PLOF: Independent, Rt hand dominant, read, puzzles, household activity, no yard work.   PATIENT GOALS:Reduce pain, being able to lift  Next MD visit:  none listed  OBJECTIVE:   PATIENT SURVEYS:  07/14/2022 FOTO intake:  54   predicted:  62  COGNITION: 07/14/2022 Overall cognitive status: WFL     SENSATION: 07/14/2022 WFL  POSTURE: 07/14/2022 Mild scapular protraction/FHP  UPPER EXTREMITY ROM:   07/14/2022:  Against gravity end range pain Rt shoulder in flexion/abduction, WFL.No limits in supine and WFL in all directions.    ROM Right 07/14/2022 Left 07/14/2022 Right 08/17/2022(post new pain onset) Right 08/19/2022  Shoulder flexion 145 (sitting)  80 AROM against gravity 95 AROM in supine 160 AROM in supine  Shoulder extension      Shoulder abduction 120 (sitting)     Shoulder adduction      Shoulder internal rotation      Shoulder external rotation      (Blank rows = not tested)  UPPER EXTREMITY MMT:  MMT Right 07/14/2022 Left 07/14/2022 Right 08/04/22  Shoulder flexion 4/5 c pain 5/5 4/5 with pain  Shoulder extension     Shoulder abduction 4/5 c pain 5/5 4/5  pain  Shoulder adduction     Shoulder internal rotation 5/5 5/5   Shoulder external rotation 4/5 c pain 5/5 4/5  pain  Middle trapezius     Lower trapezius     (Blank rows = not tested)  SHOULDER SPECIAL TESTS: 08/17/2022:  negative popeye's Rt.  Pain noted c biceps loading testing, Mild shrug sign noted.   07/14/2022 (-) Rt empty can, painful arc  JOINT MOBILITY TESTING:  07/14/2022 No restriction  PALPATION:  08/23/2022:  Strong and numerous trigger points in Rt infraspinatus, supraspinatus  07/14/2022 Trigger point  TODAY'S TREATMENT:                                                                                                                          DATE:08/23/2022 Manual: Compression to Rt infraspinatus  Trigger Point Dry-Needling  Treatment  instructions: Expect mild to moderate muscle soreness. S/S of pneumothorax if dry needled over a lung field, and to seek immediate medical attention should they occur. Patient verbalized understanding of these instructions and education.  Patient Consent Given: Yes Education handout provided: Yes Muscles treated: Rt infraspinatus  Treatment response/outcome: Localized twitch response  Therex: Sidelying AROM Rt shoulder abduction x 10 Sidelying AROM Rt shoulder ER x 10  Isometric reactive static holds in supine 90 deg shoulder flexion all directions c mild resistance 15 sec x 4  Estim IFC to Rt shoulder 10 mins for pain reduction to tolerance level. No adverse reactions.  TODAY'S TREATMENT:                                                                                                                          DATE:08/19/2022 Therex: Supine wand flexion AAROM 2 lb wand x 15 Supine serratus press 3 sec hold x 10 2 lb Supine 90 deg flexion circles cw, ccw 2 lb weight 2 x 20 each direction  Supine AROM flexion x 15 Sidelying Rt shoulder AROM abduction 2 x 10 Sidelying ER AROM c towel under arm x 5 ( limited by pain)  Estim IFC to Rt shoulder 10 mins for pain reduction to tolerance level. No adverse reactions.   TODAY'S TREATMENT:                                                                                                                          DATE:08/17/2022 Therex: Supine passive Rt shoulder flexion c Lt arm x 10 Supine wand flexion AAROM 1 lb wand x 10 Supine wand serratus press 3 sec hold x 10 1 lb wand   Review and adjustments of HEP to adapt to symptoms.   Estim IFC to Rt shoulder 10 mins for pain reduction to tolerance level. No adverse reactions.   TODAY'S TREATMENT:                                                                                                                           DATE:08/11/2022 Therex: Barrel hug stretch 5 x 10 sec hold Neck circles circles x 10  reps Shoulder rolls backwards x 10 reps Scapular retraction 5 sec hold x 15 reps  Supine chest press with 1# bar x 20 reps Supine AA flexion with 1# bar x20 reps Supine isometric ER with Lt hand providing resistance x 20 reps Supine AA ER with 1# bar x 20 reps UE ranger flexion, scaption x 20 reps Rows L2 band x 20 reps    PATIENT EDUCATION: 08/17/2022 Education details: HEP update Person educated: Patient Education method: Consulting civil engineer, Demonstration, Verbal cues, and Handouts Education comprehension: verbalized understanding, returned demonstration, and verbal cues required  HOME EXERCISE PROGRAM: Access Code: [email or web address] URL: [email or web address] Date: 08/19/2022 Prepared by: Scot Jun  Exercises - Seated Scapular Retraction  - 3-5 x daily - 7 x weekly - 1 sets - 10 reps - 3-5 hold - Standing Isometric Shoulder External Rotation with Doorway (Mirrored)  - 2 x daily - 7 x weekly - 1 sets - 10 reps - 5 hold - Standing Shoulder Posterior Capsule Stretch (Mirrored)  - 2 x daily - 7 x weekly - 1 sets - 3-5 reps - 15 hold - Seated 55 Gallon Barrel Hug Stretch  - 2 x daily - 7 x weekly - 1 sets - 10 reps - 5 hold - Supine Shoulder Flexion PROM (Mirrored)  - 2-3 x daily - 7 x weekly - 1-2 sets - 10 reps - 5 hold - Supine Shoulder Flexion Extension AAROM with Dowel  - 2-3 x daily - 7 x weekly - 1-2 sets - 10-15 reps - 3 hold - Supine Scapular Protraction in Flexion with Dumbbells  - 1-2 x daily - 7 x weekly - 1 sets - 10 reps - 3 hold - Standing Isometric Shoulder Internal Rotation at Doorway  - 1-2 x daily - 7 x weekly - 1 sets - 15 reps - 5 hold - Standing Isometric Shoulder Flexion with Doorway - Arm Bent  - 1-2 x daily - 7 x weekly - 1 sets - 10 reps - 5-10 hold  ASSESSMENT:  CLINICAL IMPRESSION: Continued pain complaints noted c movement actively.  Sitting flexion against gravity WFL c minimal symptoms noted.  Localized Rt shoulder trigger point response in Rt  infraspinatus c numerous trigger points noted.  Continued skilled Pt services indicated at this time.   OBJECTIVE IMPAIRMENTS: decreased activity tolerance, decreased coordination, decreased endurance, decreased strength, increased fascial restrictions, impaired perceived functional ability, improper body mechanics, and pain.   ACTIVITY LIMITATIONS: carrying, lifting, reach over head, and hygiene/grooming  PARTICIPATION LIMITATIONS: meal prep, cleaning, laundry, interpersonal relationship, and community activity  PERSONAL FACTORS: no specific factors are affecting patient's functional outcome.   REHAB POTENTIAL: Good  CLINICAL DECISION MAKING: Stable/uncomplicated  EVALUATION COMPLEXITY: Low   GOALS: Goals reviewed with patient? Yes  SHORT TERM GOALS: (target date for Short term goals are 3 weeks 08/03/2022)  1.Patient will demonstrate independent use of home exercise program to maintain progress from in clinic treatments. Goal status: partialy met  LONG TERM GOALS: (target dates for all long term goals are 10 weeks 09/21/2022 )   1. Patient will demonstrate/report pain at worst less than or equal to 2/10 to facilitate minimal limitation in daily activity secondary to pain symptoms. Goal status: on going 08/17/2022   2. Patient will demonstrate independent use of home exercise program to facilitate ability to maintain/progress functional gains from skilled physical therapy services. Goal status: on going 08/17/2022   3. Patient will demonstrate FOTO outcome > or = 62 % to indicate reduced disability due to condition. Goal status: on going 08/17/2022   4.  Patient will demonstrate Rt UE  MMT 5/5 throughout to facilitate lifting, reaching, carrying at [facility] in daily activity.   Goal status: on going 08/17/2022   5.  Patient will demonstrate Rt GH joint AROM WFL s symptoms to facilitate usual overhead reaching, self care, dressing at PLOF.    Goal status: on going 08/17/2022   6.  Patient  will demonstrate/report ability to sleep s restriction. Goal status: on going 08/17/2022   PLAN:  PT FREQUENCY: 1-2x/week  PT DURATION: 10 weeks  PLANNED INTERVENTIONS: Therapeutic exercises, Therapeutic activity, Neuro Muscular re-education, Balance training, Gait training, Patient/Family education, Joint mobilization, Stair training, DME instructions, Dry Needling, Electrical stimulation, Traction, Cryotherapy, vasopneumatic device Moist heat, Taping, Ultrasound, Ionotophoresis '4mg'$ /ml Dexamethasone, and Manual therapy.  All included unless contraindicated  PLAN FOR NEXT SESSION:  FOTO reassessment ( > 30 days) next visit (wait for symptom reduction).  Progressive AROM and strength improvements to tolerance.   Dry needling if helpful.   Scot Jun, PT, DPT, OCS, ATC 08/23/22  3:49 PM

## 2022-08-26 ENCOUNTER — Ambulatory Visit (INDEPENDENT_AMBULATORY_CARE_PROVIDER_SITE_OTHER): Payer: Medicare Other | Admitting: Rehabilitative and Restorative Service Providers"

## 2022-08-26 ENCOUNTER — Encounter: Payer: Self-pay | Admitting: Rehabilitative and Restorative Service Providers"

## 2022-08-26 DIAGNOSIS — G8929 Other chronic pain: Secondary | ICD-10-CM | POA: Diagnosis not present

## 2022-08-26 DIAGNOSIS — M6281 Muscle weakness (generalized): Secondary | ICD-10-CM

## 2022-08-26 DIAGNOSIS — M25511 Pain in right shoulder: Secondary | ICD-10-CM | POA: Diagnosis not present

## 2022-08-26 NOTE — Therapy (Signed)
OUTPATIENT PHYSICAL THERAPY TREATMENT NOTE/ PROGRESS NOTE   Patient Name: Cassidy Garcia MRN: [id number] DOB:06/06/1946, 77 y.o., female Today's Date: 08/26/2022  Progress Note Reporting Period 07/14/2022 to 08/26/2022  See note below for Objective Data and Assessment of Progress/Goals.       END OF SESSION:   PT End of Session - 08/26/22 1519     Visit Number 10    Number of Visits 20    Date for PT Re-Evaluation 09/22/22    Authorization Type Medicare and [place]    Progress Note Due on Visit 10    PT Start Time 1518    PT Stop Time 5638    PT Time Calculation (min) 37 min    Activity Tolerance Patient tolerated treatment well    Behavior During Therapy WFL for tasks assessed/performed                     Past Medical History:  Diagnosis Date   Arthritis    Asthma    Bronchitis 03/2016   Chronic kidney disease    stage 3   Colon polyps    tubular adenoma   Constipation    Depression    Diverticulosis    GERD (gastroesophageal reflux disease)    Hemorrhoids    Hyperlipidemia    Hypertension    Hypothyroidism    Shortness of breath    with exertion on occasion   Sinusitis    Skin cancer    Past Surgical History:  Procedure Laterality Date   ABDOMINAL HYSTERECTOMY     CARPOMETACARPAL ([place]) FUSION OF THUMB Bilateral    CATARACT EXTRACTION, BILATERAL     CHOLECYSTECTOMY     COLONOSCOPY  02/12/2011   HAMMER TOE SURGERY Right    LAPAROSCOPIC APPENDECTOMY N/A 08/26/2021   Procedure: APPENDECTOMY LAPAROSCOPIC WITH LYSIS OF ADHESIONS;  Surgeon: Khizar Fiorella Boston, MD;  Location: [facility];  Service: General;  Laterality: N/A;   NASAL SINUS SURGERY     POLYPECTOMY     TOTAL HIP ARTHROPLASTY  12/10/2011   Procedure: TOTAL HIP ARTHROPLASTY ANTERIOR APPROACH;  Surgeon: Mcarthur Rossetti, MD;  Location: [facility];  Service: Orthopedics;  Laterality: Right;  Right Total Hip Arthroplasty, Anterior Approach   Patient Active Problem List   Diagnosis Date  Noted   Rotator cuff tendonitis, right 07/02/2022   Nausea 12/02/2021   Bright red rectal bleeding 09/15/2021   History of colonic polyps 07/13/2021   Gastroenteritis 09/09/2020   Anemia 03/06/2020   Hypothyroidism 02/04/2020   Depression 01/24/2020   Asthma 01/24/2020   CKD (chronic kidney disease), stage III ([place]) 01/24/2020   Acute on chronic renal failure (HCC) 01/24/2020   Constipation 12/20/2018   Pain in right hip 06/29/2017   Trochanteric bursitis, right hip 06/29/2017   History of total replacement of right hip 06/29/2017   Hemorrhoids 03/01/2017   Great toe pain, left 12/04/2016   Chronic right shoulder pain 12/04/2016   Non-toxic multinodular goiter 05/14/2013   Degenerative arthritis of hip 12/10/2011   HTN (hypertension) 07/06/2011   Hyperlipidemia 07/06/2011   Gastro-esophageal reflux disease without esophagitis 09/23/2009     THERAPY DIAG:  Chronic right shoulder pain  Muscle weakness (generalized)   PCP: Burnard Bunting MD  REFERRING PROVIDER: Persons, Bevely Palmer, [place]  REFERRING DIAG: [id number] (ICD-10-CM) - Chronic right shoulder pain  EVAL THERAPY DIAG:  Chronic right shoulder pain  Muscle weakness (generalized)  Rationale for Evaluation and Treatment: Rehabilitation  ONSET DATE: Sept 2023  SUBJECTIVE:  SUBJECTIVE STATEMENT: Pt indicated every day is different with pain location and severity at times.  Pt indicated she thought was better overall.   Global rating of change +4 at this time, moderately better.   PERTINENT HISTORY: CKD, history of chronic back pain.  History of physical therapy for back/hip.   PAIN:  NPRS scale: 0/10 at rest. At worst in last 24 hours 5-6/10 Pain location: Rt shoulder   Pain description: reaching movements.  Aggravating factors:  reaching overhead/lifting items Relieving factors: Voltaren gel, patches, tylenol  PRECAUTIONS: None  WEIGHT BEARING RESTRICTIONS: No  FALLS:  Has patient fallen in last 6 months? No  LIVING ENVIRONMENT: Lives in: House/apartment Stairs: has stairs at home  OCCUPATION: No work  PLOF: Independent, Rt hand dominant, read, puzzles, household activity, no yard work.   PATIENT GOALS:Reduce pain, being able to lift  Next MD visit:  none listed  OBJECTIVE:   PATIENT SURVEYS:  08/26/2022: foto update:  54  07/14/2022 FOTO intake:  54   predicted:  62  COGNITION: 07/14/2022 Overall cognitive status: WFL     SENSATION: 07/14/2022 WFL  POSTURE: 07/14/2022 Mild scapular protraction/FHP  UPPER EXTREMITY ROM:   07/14/2022:  Against gravity end range pain Rt shoulder in flexion/abduction, WFL.No limits in supine and WFL in all directions.    ROM Right 07/14/2022 Left 07/14/2022 Right 08/17/2022(post new pain onset) Right 08/19/2022 Right  08/26/2022  Shoulder flexion 145 (sitting)  80 AROM against gravity 95 AROM in supine 160 AROM in supine 165 AROM sitting  Shoulder extension       Shoulder abduction 120 (sitting)    150 AROM sitting  Shoulder adduction       Shoulder internal rotation       Shoulder external rotation       (Blank rows = not tested)  UPPER EXTREMITY MMT:  MMT Right 07/14/2022 Left 07/14/2022 Right 08/04/22 Right 08/26/2022  Shoulder flexion 4/5 c pain 5/5 4/5 with pain 5/5  Shoulder extension      Shoulder abduction 4/5 c pain 5/5 4/5  pain 5/5  Shoulder adduction      Shoulder internal rotation 5/5 5/5  5/5  Shoulder external rotation 4/5 c pain 5/5 4/5  pain 4/5 c pain  Middle trapezius      Lower trapezius      (Blank rows = not tested)  SHOULDER SPECIAL TESTS: 08/17/2022:  negative popeye's Rt.  Pain noted c biceps loading testing, Mild shrug sign noted.   07/14/2022 (-) Rt empty can, painful arc  JOINT MOBILITY TESTING:   07/14/2022 No restriction  PALPATION:  08/23/2022:  Strong and numerous trigger points in Rt infraspinatus, supraspinatus  07/14/2022 Trigger point  TODAY'S TREATMENT:                                                                                                                          DATE:08/26/2022 Manual: Compression to Rt infraspinatus  Trigger Point Dry-Needling  Treatment instructions: Expect mild  to moderate muscle soreness. S/S of pneumothorax if dry needled over a lung field, and to seek immediate medical attention should they occur. Patient verbalized understanding of these instructions and education.  Patient Consent Given: Yes Education handout provided: Yes Muscles treated: Rt infraspinatus  Treatment response/outcome: Localized twitch response   Therex: Sidelying AROM Rt shoulder abduction x 10 Sidelying AROM Rt shoulder ER x 10  Isometric reactive static holds in supine 90 deg shoulder flexion all directions c mild resistance 15 sec x 4  Estim IFC to Rt shoulder 10 mins for pain reduction to tolerance level. No adverse reactions.  TODAY'S TREATMENT:                                                                                                                          DATE:08/23/2022 Manual: Compression to Rt infraspinatus  Trigger Point Dry-Needling  Treatment instructions: Expect mild to moderate muscle soreness. S/S of pneumothorax if dry needled over a lung field, and to seek immediate medical attention should they occur. Patient verbalized understanding of these instructions and education.  Patient Consent Given: Yes Education handout provided: Yes Muscles treated: Rt infraspinatus  Treatment response/outcome: Localized twitch response   Therex: Tband rows green 2 x 10 Tband gh ext green 2 x 10   Estim IFC to Rt shoulder 10 mins for pain reduction to tolerance level. No adverse reactions.  TODAY'S TREATMENT:                                                                                                                           DATE:08/19/2022 Therex: Supine wand flexion AAROM 2 lb wand x 15 Supine serratus press 3 sec hold x 10 2 lb Supine 90 deg flexion circles cw, ccw 2 lb weight 2 x 20 each direction  Supine AROM flexion x 15 Sidelying Rt shoulder AROM abduction 2 x 10 Sidelying ER AROM c towel under arm x 5 ( limited by pain)  Estim IFC to Rt shoulder 10 mins for pain reduction to tolerance level. No adverse reactions.   TODAY'S TREATMENT:  DATE:08/17/2022 Therex: Supine passive Rt shoulder flexion c Lt arm x 10 Supine wand flexion AAROM 1 lb wand x 10 Supine wand serratus press 3 sec hold x 10 1 lb wand   Review and adjustments of HEP to adapt to symptoms.   Estim IFC to Rt shoulder 10 mins for pain reduction to tolerance level. No adverse reactions.    PATIENT EDUCATION: 08/17/2022 Education details: HEP update Person educated: Patient Education method: Consulting civil engineer, Demonstration, Verbal cues, and Handouts Education comprehension: verbalized understanding, returned demonstration, and verbal cues required  HOME EXERCISE PROGRAM: Access Code: [email or web address] URL: [email or web address] Date: 08/19/2022 Prepared by: Scot Jun  Exercises - Seated Scapular Retraction  - 3-5 x daily - 7 x weekly - 1 sets - 10 reps - 3-5 hold - Standing Isometric Shoulder External Rotation with Doorway (Mirrored)  - 2 x daily - 7 x weekly - 1 sets - 10 reps - 5 hold - Standing Shoulder Posterior Capsule Stretch (Mirrored)  - 2 x daily - 7 x weekly - 1 sets - 3-5 reps - 15 hold - Seated 55 Gallon Barrel Hug Stretch  - 2 x daily - 7 x weekly - 1 sets - 10 reps - 5 hold - Supine Shoulder Flexion PROM (Mirrored)  - 2-3 x daily - 7 x weekly - 1-2 sets - 10 reps - 5 hold - Supine Shoulder Flexion Extension AAROM with  Dowel  - 2-3 x daily - 7 x weekly - 1-2 sets - 10-15 reps - 3 hold - Supine Scapular Protraction in Flexion with Dumbbells  - 1-2 x daily - 7 x weekly - 1 sets - 10 reps - 3 hold - Standing Isometric Shoulder Internal Rotation at Doorway  - 1-2 x daily - 7 x weekly - 1 sets - 15 reps - 5 hold - Standing Isometric Shoulder Flexion with Doorway - Arm Bent  - 1-2 x daily - 7 x weekly - 1 sets - 10 reps - 5-10 hold  ASSESSMENT:  CLINICAL IMPRESSION: Pt has attended 10 visits overall during course of treatment reporting GROC +4 moderately better at this time.  See objective data for updated information showing improvements mild in mobility and strength.  Pt has continued to report limiting Rt shoulder pain c mobility/strength deficits that impair her functional daily lifting, reaching as noted.  Continued skilled PT services indicated at this time.   OBJECTIVE IMPAIRMENTS: decreased activity tolerance, decreased coordination, decreased endurance, decreased strength, increased fascial restrictions, impaired perceived functional ability, improper body mechanics, and pain.   ACTIVITY LIMITATIONS: carrying, lifting, reach over head, and hygiene/grooming  PARTICIPATION LIMITATIONS: meal prep, cleaning, laundry, interpersonal relationship, and community activity  PERSONAL FACTORS: no specific factors are affecting patient's functional outcome.   REHAB POTENTIAL: Good  CLINICAL DECISION MAKING: Stable/uncomplicated  EVALUATION COMPLEXITY: Low   GOALS: Goals reviewed with patient? Yes  SHORT TERM GOALS: (target date for Short term goals are 3 weeks 08/03/2022)  1.Patient will demonstrate independent use of home exercise program to maintain progress from in clinic treatments. Goal status: partialy met  LONG TERM GOALS: (target dates for all long term goals are 10 weeks 09/21/2022 )   1. Patient will demonstrate/report pain at worst less than or equal to 2/10 to facilitate minimal limitation in  daily activity secondary to pain symptoms. Goal status: on going 08/26/2022   2. Patient will demonstrate independent use of home exercise program to facilitate ability to maintain/progress functional gains from skilled physical therapy services. Goal status: on going  08/26/2022   3. Patient will demonstrate FOTO outcome > or = 62 % to indicate reduced disability due to condition. Goal status: on going 08/26/2022   4.  Patient will demonstrate Rt UE MMT 5/5 throughout to facilitate lifting, reaching, carrying at [facility] in daily activity.   Goal status: on going 08/26/2022   5.  Patient will demonstrate Rt GH joint AROM WFL s symptoms to facilitate usual overhead reaching, self care, dressing at PLOF.    Goal status: on going 08/26/2022   6.  Patient will demonstrate/report ability to sleep s restriction. Goal status: MET 08/26/2022   PLAN:  PT FREQUENCY: 1-2x/week  PT DURATION: 10 weeks  PLANNED INTERVENTIONS: Therapeutic exercises, Therapeutic activity, Neuro Muscular re-education, Balance training, Gait training, Patient/Family education, Joint mobilization, Stair training, DME instructions, Dry Needling, Electrical stimulation, Traction, Cryotherapy, vasopneumatic device Moist heat, Taping, Ultrasound, Ionotophoresis '4mg'$ /ml Dexamethasone, and Manual therapy.  All included unless contraindicated  PLAN FOR NEXT SESSION:  Dry needling, estim as desired based off symptoms.  Promote improved strengthening for Rt shoulder use.    Scot Jun, PT, DPT, OCS, ATC 08/26/22  3:53 PM

## 2022-08-30 ENCOUNTER — Encounter: Payer: Self-pay | Admitting: Rehabilitative and Restorative Service Providers"

## 2022-08-30 ENCOUNTER — Ambulatory Visit (INDEPENDENT_AMBULATORY_CARE_PROVIDER_SITE_OTHER): Payer: Medicare Other | Admitting: Rehabilitative and Restorative Service Providers"

## 2022-08-30 DIAGNOSIS — M25511 Pain in right shoulder: Secondary | ICD-10-CM

## 2022-08-30 DIAGNOSIS — M6281 Muscle weakness (generalized): Secondary | ICD-10-CM | POA: Diagnosis not present

## 2022-08-30 DIAGNOSIS — G8929 Other chronic pain: Secondary | ICD-10-CM | POA: Diagnosis not present

## 2022-08-30 NOTE — Therapy (Signed)
OUTPATIENT PHYSICAL THERAPY TREATMENT NOTE   Patient Name: Cassidy Garcia MRN: [id number] DOB:[birth year], 77 y.o., female Today's Date: 08/30/2022   END OF SESSION:   PT End of Session - 08/30/22 1521     Visit Number 11    Number of Visits 20    Date for PT Re-Evaluation 09/22/22    Authorization Type Medicare and [employer]    Progress Note Due on Visit 10    PT Start Time 3557    PT Stop Time 1555    PT Time Calculation (min) 40 min    Activity Tolerance Patient tolerated treatment well    Behavior During Therapy WFL for tasks assessed/performed                      Past Medical History:  Diagnosis Date   Arthritis    Asthma    Bronchitis 03/2016   Chronic kidney disease    stage 3   Colon polyps    tubular adenoma   Constipation    Depression    Diverticulosis    GERD (gastroesophageal reflux disease)    Hemorrhoids    Hyperlipidemia    Hypertension    Hypothyroidism    Shortness of breath    with exertion on occasion   Sinusitis    Skin cancer    Past Surgical History:  Procedure Laterality Date   ABDOMINAL HYSTERECTOMY     CARPOMETACARPAL ([place]) FUSION OF THUMB Bilateral    CATARACT EXTRACTION, BILATERAL     CHOLECYSTECTOMY     COLONOSCOPY  02/12/2011   HAMMER TOE SURGERY Right    LAPAROSCOPIC APPENDECTOMY N/A 08/26/2021   Procedure: APPENDECTOMY LAPAROSCOPIC WITH LYSIS OF ADHESIONS;  Surgeon: Clotilda Hafer Boston, MD;  Location: [facility];  Service: General;  Laterality: N/A;   NASAL SINUS SURGERY     POLYPECTOMY     TOTAL HIP ARTHROPLASTY  12/10/2011   Procedure: TOTAL HIP ARTHROPLASTY ANTERIOR APPROACH;  Surgeon: Mcarthur Rossetti, MD;  Location: [facility];  Service: Orthopedics;  Laterality: Right;  Right Total Hip Arthroplasty, Anterior Approach   Patient Active Problem List   Diagnosis Date Noted   Rotator cuff tendonitis, right 07/02/2022   Nausea 12/02/2021   Bright red rectal bleeding 09/15/2021   History of colonic polyps 07/13/2021    Gastroenteritis 09/09/2020   Anemia 03/06/2020   Hypothyroidism 02/04/2020   Depression 01/24/2020   Asthma 01/24/2020   CKD (chronic kidney disease), stage III ([place]) 01/24/2020   Acute on chronic renal failure (HCC) 01/24/2020   Constipation 12/20/2018   Pain in right hip 06/29/2017   Trochanteric bursitis, right hip 06/29/2017   History of total replacement of right hip 06/29/2017   Hemorrhoids 03/01/2017   Great toe pain, left 12/04/2016   Chronic right shoulder pain 12/04/2016   Non-toxic multinodular goiter 05/14/2013   Degenerative arthritis of hip 12/10/2011   HTN (hypertension) 07/06/2011   Hyperlipidemia 07/06/2011   Gastro-esophageal reflux disease without esophagitis 09/23/2009     THERAPY DIAG:  Chronic right shoulder pain  Muscle weakness (generalized)   PCP: Burnard Bunting MD  REFERRING PROVIDER: Persons, Bevely [place], [place]  REFERRING DIAG: [phone] (ICD-10-CM) - Chronic right shoulder pain  EVAL THERAPY DIAG:  Chronic right shoulder pain  Muscle weakness (generalized)  Rationale for Evaluation and Treatment: Rehabilitation  ONSET DATE: Sept 2023  SUBJECTIVE:  SUBJECTIVE STATEMENT: Pt indicated while she was here last visit she felt some sore into the afternoon and evening.  Next day felt pain in Rt arm into elbow for several day. She indicated she took some ibuprofen and felt improvement from that (usually tries not to take too much).   PERTINENT HISTORY: CKD, history of chronic back pain.  History of physical therapy for back/hip.   PAIN:  NPRS scale: 2-3/10 at rest. At worst in last weekend 8-9/10 Pain location: Rt shoulder   Pain description: reaching movements but at rest too at night  Aggravating factors: reaching overhead/lifting items Relieving factors:  Voltaren gel, patches, tylenol  PRECAUTIONS: None  WEIGHT BEARING RESTRICTIONS: No  FALLS:  Has patient fallen in last 6 months? No  LIVING ENVIRONMENT: Lives in: House/apartment Stairs: has stairs at home  OCCUPATION: No work  PLOF: Independent, Rt hand dominant, read, puzzles, household activity, no yard work.   PATIENT GOALS:Reduce pain, being able to lift  Next MD visit:  none listed  OBJECTIVE:   PATIENT SURVEYS:  08/26/2022: foto update:  54  07/14/2022 FOTO intake:  54   predicted:  62  COGNITION: 07/14/2022 Overall cognitive status: WFL     SENSATION: 07/14/2022 WFL  POSTURE: 07/14/2022 Mild scapular protraction/FHP  UPPER EXTREMITY ROM:   07/14/2022:  Against gravity end range pain Rt shoulder in flexion/abduction, WFL.No limits in supine and WFL in all directions.    ROM Right 07/14/2022 Left 07/14/2022 Right 08/17/2022(post new pain onset) Right 08/19/2022 Right  08/26/2022  Shoulder flexion 145 (sitting)  80 AROM against gravity 95 AROM in supine 160 AROM in supine 165 AROM sitting  Shoulder extension       Shoulder abduction 120 (sitting)    150 AROM sitting  Shoulder adduction       Shoulder internal rotation       Shoulder external rotation       (Blank rows = not tested)  UPPER EXTREMITY MMT:  MMT Right 07/14/2022 Left 07/14/2022 Right 08/04/22 Right 08/26/2022  Shoulder flexion 4/5 c pain 5/5 4/5 with pain 5/5  Shoulder extension      Shoulder abduction 4/5 c pain 5/5 4/5  pain 5/5  Shoulder adduction      Shoulder internal rotation 5/5 5/5  5/5  Shoulder external rotation 4/5 c pain 5/5 4/5  pain 4/5 c pain  Middle trapezius      Lower trapezius      (Blank rows = not tested)  SHOULDER SPECIAL TESTS: 08/17/2022:  negative popeye's Rt.  Pain noted c biceps loading testing, Mild shrug sign noted.   07/14/2022 (-) Rt empty can, painful arc  JOINT MOBILITY TESTING:  07/14/2022 No restriction  PALPATION:  08/23/2022:  Strong  and numerous trigger points in Rt infraspinatus, supraspinatus  07/14/2022 Trigger point  TODAY'S TREATMENT:                                                                                                         DATE:08/30/2022 Therex: Seated Rt shoulder ER isometric 5 sec hold x 10  against manual resistance in submax/painfree level  Supine AROM Rt shoulder 1 lb x 15 Supine circles in 90 deg Rt shoulder flexion 2 lb 2 x 30 each way (cw, ccw)  Sidelying AROM Rt shoulder abduction x 15 0 lb, x 10 1 lb Sidelying AROM Rt shoulder ER x 10 1 lb, x 10 0 lb    Estim IFC to Rt shoulder 10 mins for pain reduction to tolerance level. No adverse reactions.  TODAY'S TREATMENT:                                                                                                      DATE:08/26/2022 Manual: Compression to Rt infraspinatus  Trigger Point Dry-Needling  Treatment instructions: Expect mild to moderate muscle soreness. S/S of pneumothorax if dry needled over a lung field, and to seek immediate medical attention should they occur. Patient verbalized understanding of these instructions and education.  Patient Consent Given: Yes Education handout provided: Yes Muscles treated: Rt infraspinatus  Treatment response/outcome: Localized twitch response   Therex: Sidelying AROM Rt shoulder abduction x 10 Sidelying AROM Rt shoulder ER x 10  Isometric reactive static holds in supine 90 deg shoulder flexion all directions c mild resistance 15 sec x 4  Estim IFC to Rt shoulder 10 mins for pain reduction to tolerance level. No adverse reactions.  TODAY'S TREATMENT:                                                                                                                          DATE:08/23/2022 Manual: Compression to Rt infraspinatus  Trigger Point Dry-Needling  Treatment instructions: Expect mild to moderate muscle soreness. S/S of pneumothorax if dry needled over a lung field, and to seek  immediate medical attention should they occur. Patient verbalized understanding of these instructions and education.  Patient Consent Given: Yes Education handout provided: Yes Muscles treated: Rt infraspinatus  Treatment response/outcome: Localized twitch response   Therex: Tband rows green 2 x 10 Tband gh ext green 2 x 10   Estim IFC to Rt shoulder 10 mins for pain reduction to tolerance level. No adverse reactions.    PATIENT EDUCATION: 08/17/2022 Education details: HEP update Person educated: Patient Education method: Consulting civil engineer, Demonstration, Verbal cues, and Handouts Education comprehension: verbalized understanding, returned demonstration, and verbal cues required  HOME EXERCISE PROGRAM: Access Code: [email or web address] URL: [email or web address] Date: 08/19/2022 Prepared by: Scot Jun  Exercises - Seated Scapular Retraction  - 3-5 x daily - 7 x weekly - 1 sets - 10 reps - 3-5 hold - Standing Isometric Shoulder External  Rotation with Doorway (Mirrored)  - 2 x daily - 7 x weekly - 1 sets - 10 reps - 5 hold - Standing Shoulder Posterior Capsule Stretch (Mirrored)  - 2 x daily - 7 x weekly - 1 sets - 3-5 reps - 15 hold - Seated 55 Gallon Barrel Hug Stretch  - 2 x daily - 7 x weekly - 1 sets - 10 reps - 5 hold - Supine Shoulder Flexion PROM (Mirrored)  - 2-3 x daily - 7 x weekly - 1-2 sets - 10 reps - 5 hold - Supine Shoulder Flexion Extension AAROM with Dowel  - 2-3 x daily - 7 x weekly - 1-2 sets - 10-15 reps - 3 hold - Supine Scapular Protraction in Flexion with Dumbbells  - 1-2 x daily - 7 x weekly - 1 sets - 10 reps - 3 hold - Standing Isometric Shoulder Internal Rotation at Doorway  - 1-2 x daily - 7 x weekly - 1 sets - 15 reps - 5 hold - Standing Isometric Shoulder Flexion with Doorway - Arm Bent  - 1-2 x daily - 7 x weekly - 1 sets - 10 reps - 5-10 hold  ASSESSMENT:  CLINICAL IMPRESSION: Symptom increase reported over weekend and upon arrival today but  observed active range movement qualities were at or some better than last visit (improved ER against gravity control and ability).  Continued strengthening to help improve Rt arm movement ability and tolerance.    OBJECTIVE IMPAIRMENTS: decreased activity tolerance, decreased coordination, decreased endurance, decreased strength, increased fascial restrictions, impaired perceived functional ability, improper body mechanics, and pain.   ACTIVITY LIMITATIONS: carrying, lifting, reach over head, and hygiene/grooming  PARTICIPATION LIMITATIONS: meal prep, cleaning, laundry, interpersonal relationship, and community activity  PERSONAL FACTORS: no specific factors are affecting patient's functional outcome.   REHAB POTENTIAL: Good  CLINICAL DECISION MAKING: Stable/uncomplicated  EVALUATION COMPLEXITY: Low   GOALS: Goals reviewed with patient? Yes  SHORT TERM GOALS: (target date for Short term goals are 3 weeks 08/03/2022)  1.Patient will demonstrate independent use of home exercise program to maintain progress from in clinic treatments. Goal status: partialy met  LONG TERM GOALS: (target dates for all long term goals are 10 weeks 09/21/2022 )   1. Patient will demonstrate/report pain at worst less than or equal to 2/10 to facilitate minimal limitation in daily activity secondary to pain symptoms. Goal status: on going 08/26/2022   2. Patient will demonstrate independent use of home exercise program to facilitate ability to maintain/progress functional gains from skilled physical therapy services. Goal status: on going 08/26/2022   3. Patient will demonstrate FOTO outcome > or = 62 % to indicate reduced disability due to condition. Goal status: on going 08/26/2022   4.  Patient will demonstrate Rt UE MMT 5/5 throughout to facilitate lifting, reaching, carrying at [facility] in daily activity.   Goal status: on going 08/26/2022   5.  Patient will demonstrate Rt GH joint AROM WFL s symptoms to  facilitate usual overhead reaching, self care, dressing at PLOF.    Goal status: on going 08/26/2022   6.  Patient will demonstrate/report ability to sleep s restriction. Goal status: MET 08/26/2022   PLAN:  PT FREQUENCY: 1-2x/week  PT DURATION: 10 weeks  PLANNED INTERVENTIONS: Therapeutic exercises, Therapeutic activity, Neuro Muscular re-education, Balance training, Gait training, Patient/Family education, Joint mobilization, Stair training, DME instructions, Dry Needling, Electrical stimulation, Traction, Cryotherapy, vasopneumatic device Moist heat, Taping, Ultrasound, Ionotophoresis '4mg'$ /ml Dexamethasone, and Manual therapy.  All  included unless contraindicated  PLAN FOR NEXT SESSION:  Dry needling, estim as desired based off symptoms.  Promote improved strengthening for Rt shoulder use.    Scot Jun, PT, DPT, OCS, ATC 08/30/22  3:49 PM

## 2022-08-31 ENCOUNTER — Other Ambulatory Visit: Payer: Self-pay | Admitting: Physician Assistant

## 2022-08-31 MED ORDER — ACETAMINOPHEN-CODEINE 300-30 MG PO TABS: 1.0000 | ORAL_TABLET | ORAL | 0 refills | Status: DC | PRN

## 2022-09-02 ENCOUNTER — Ambulatory Visit (INDEPENDENT_AMBULATORY_CARE_PROVIDER_SITE_OTHER): Payer: Medicare Other | Admitting: Physical Therapy

## 2022-09-02 ENCOUNTER — Encounter: Payer: Self-pay | Admitting: Physical Therapy

## 2022-09-02 DIAGNOSIS — G8929 Other chronic pain: Secondary | ICD-10-CM

## 2022-09-02 DIAGNOSIS — M25511 Pain in right shoulder: Secondary | ICD-10-CM | POA: Diagnosis not present

## 2022-09-02 DIAGNOSIS — M6281 Muscle weakness (generalized): Secondary | ICD-10-CM

## 2022-09-02 NOTE — Therapy (Signed)
OUTPATIENT PHYSICAL THERAPY TREATMENT NOTE   Patient Name: Cassidy Garcia MRN: [id number] DOB:01/01/1946, 77 y.o., female Today's Date: 09/02/2022   END OF SESSION:   PT End of Session - 09/02/22 1514     Visit Number 12    Number of Visits 20    Date for PT Re-Evaluation 09/22/22    Authorization Type Medicare and BCBS Federal    Progress Note Due on Visit 20    PT Start Time 5701    PT Stop Time 1551    PT Time Calculation (min) 40 min    Activity Tolerance Patient tolerated treatment well    Behavior During Therapy WFL for tasks assessed/performed                       Past Medical History:  Diagnosis Date   Arthritis    Asthma    Bronchitis 03/2016   Chronic kidney disease    stage 3   Colon polyps    tubular adenoma   Constipation    Depression    Diverticulosis    GERD (gastroesophageal reflux disease)    Hemorrhoids    Hyperlipidemia    Hypertension    Hypothyroidism    Shortness of breath    with exertion on occasion   Sinusitis    Skin cancer    Past Surgical History:  Procedure Laterality Date   ABDOMINAL HYSTERECTOMY     CARPOMETACARPAL ([place]) FUSION OF THUMB Bilateral    CATARACT EXTRACTION, BILATERAL     CHOLECYSTECTOMY     COLONOSCOPY  02/12/2011   HAMMER TOE SURGERY Right    LAPAROSCOPIC APPENDECTOMY N/A 08/26/2021   Procedure: APPENDECTOMY LAPAROSCOPIC WITH LYSIS OF ADHESIONS;  Surgeon: Michael Boston, MD;  Location: [facility];  Service: General;  Laterality: N/A;   NASAL SINUS SURGERY     POLYPECTOMY     TOTAL HIP ARTHROPLASTY  12/10/2011   Procedure: TOTAL HIP ARTHROPLASTY ANTERIOR APPROACH;  Surgeon: Mcarthur Rossetti, MD;  Location: [facility];  Service: Orthopedics;  Laterality: Right;  Right Total Hip Arthroplasty, Anterior Approach   Patient Active Problem List   Diagnosis Date Noted   Rotator cuff tendonitis, right 07/02/2022   Nausea 12/02/2021   Bright red rectal bleeding 09/15/2021   History of colonic polyps  07/13/2021   Gastroenteritis 09/09/2020   Anemia 03/06/2020   Hypothyroidism 02/04/2020   Depression 01/24/2020   Asthma 01/24/2020   CKD (chronic kidney disease), stage III ([place]) 01/24/2020   Acute on chronic renal failure (HCC) 01/24/2020   Constipation 12/20/2018   Pain in right hip 06/29/2017   Trochanteric bursitis, right hip 06/29/2017   History of total replacement of right hip 06/29/2017   Hemorrhoids 03/01/2017   Great toe pain, left 12/04/2016   Chronic right shoulder pain 12/04/2016   Non-toxic multinodular goiter 05/14/2013   Degenerative arthritis of hip 12/10/2011   HTN (hypertension) 07/06/2011   Hyperlipidemia 07/06/2011   Gastro-esophageal reflux disease without esophagitis 09/23/2009     THERAPY DIAG:  Chronic right shoulder pain  Muscle weakness (generalized)   PCP: Burnard Bunting MD  REFERRING PROVIDER: Persons, Bevely Palmer, [place]  REFERRING DIAG: [phone] (ICD-10-CM) - Chronic right shoulder pain  EVAL THERAPY DIAG:  Chronic right shoulder pain  Muscle weakness (generalized)  Rationale for Evaluation and Treatment: Rehabilitation  ONSET DATE: Sept 2023  SUBJECTIVE:  SUBJECTIVE STATEMENT: Still having a lot of aching and discomfort in the evening.  Has had to take medication to get to sleep.  PERTINENT HISTORY: CKD, history of chronic back pain.  History of physical therapy for back/hip.   PAIN:  NPRS scale: 2-3/10 at rest. At worst in last weekend 8-9/10 Pain location: Rt shoulder   Pain description: reaching movements but at rest too at night  Aggravating factors: reaching overhead/lifting items Relieving factors: Voltaren gel, patches, tylenol  PRECAUTIONS: None  WEIGHT BEARING RESTRICTIONS: No  FALLS:  Has patient fallen in last 6 months?  No  LIVING ENVIRONMENT: Lives in: House/apartment Stairs: has stairs at home  OCCUPATION: No work  PLOF: Independent, Rt hand dominant, read, puzzles, household activity, no yard work.   PATIENT GOALS:Reduce pain, being able to lift  Next MD visit:  none listed  OBJECTIVE:   PATIENT SURVEYS:  08/26/2022: foto update:  54  07/14/2022 FOTO intake:  54   predicted:  62  COGNITION: 07/14/2022 Overall cognitive status: WFL     SENSATION: 07/14/2022 WFL  POSTURE: 07/14/2022 Mild scapular protraction/FHP  UPPER EXTREMITY ROM:   07/14/2022:  Against gravity end range pain Rt shoulder in flexion/abduction, WFL.No limits in supine and WFL in all directions.    ROM Right 07/14/2022 Left 07/14/2022 Right 08/17/2022(post new pain onset) Right 08/19/2022 Right  08/26/2022  Shoulder flexion 145 (sitting)  80 AROM against gravity 95 AROM in supine 160 AROM in supine 165 AROM sitting  Shoulder extension       Shoulder abduction 120 (sitting)    150 AROM sitting  Shoulder adduction       Shoulder internal rotation       Shoulder external rotation       (Blank rows = not tested)  UPPER EXTREMITY MMT:  MMT Right 07/14/2022 Left 07/14/2022 Right 08/04/22 Right 08/26/2022  Shoulder flexion 4/5 c pain 5/5 4/5 with pain 5/5  Shoulder extension      Shoulder abduction 4/5 c pain 5/5 4/5  pain 5/5  Shoulder adduction      Shoulder internal rotation 5/5 5/5  5/5  Shoulder external rotation 4/5 c pain 5/5 4/5  pain 4/5 c pain  Middle trapezius      Lower trapezius      (Blank rows = not tested)  SHOULDER SPECIAL TESTS: 08/17/2022:  negative popeye's Rt.  Pain noted c biceps loading testing, Mild shrug sign noted.   07/14/2022 (-) Rt empty can, painful arc  JOINT MOBILITY TESTING:  07/14/2022 No restriction  PALPATION:  08/23/2022:  Strong and numerous trigger points in Rt infraspinatus, supraspinatus  07/14/2022 Trigger point  TODAY'S TREATMENT:                                                                                                          DATE:09/02/2022 Therex: Supine Rt shoulder flexion 2x10; 1# Supine circles in 90 deg Rt shoulder flexion 1 lb x 30 each way (cw, ccw) Sidelying Rt shoulder abduction 2x10; 1# Sidelying Rt shoulder ER 2x10; 1#  Standing short lever  arm Rt shoulder flexion 1# x 10 reps; then performed in elevated supine position x 10 reps Rows L3 band 2x10 Wall ladder flexion on Rt x 5 reps    TODAY'S TREATMENT:                                                                                                         DATE:08/30/2022 Therex: Seated Rt shoulder ER isometric 5 sec hold x 10 against manual resistance in submax/painfree level  Supine AROM Rt shoulder 1 lb x 15 Supine circles in 90 deg Rt shoulder flexion 2 lb 2 x 30 each way (cw, ccw)  Sidelying AROM Rt shoulder abduction x 15 0 lb, x 10 1 lb Sidelying AROM Rt shoulder ER x 10 1 lb, x 10 0 lb    Estim IFC to Rt shoulder 10 mins for pain reduction to tolerance level. No adverse reactions.  TODAY'S TREATMENT:                                                                                                      DATE:08/26/2022 Manual: Compression to Rt infraspinatus  Trigger Point Dry-Needling  Treatment instructions: Expect mild to moderate muscle soreness. S/S of pneumothorax if dry needled over a lung field, and to seek immediate medical attention should they occur. Patient verbalized understanding of these instructions and education.  Patient Consent Given: Yes Education handout provided: Yes Muscles treated: Rt infraspinatus  Treatment response/outcome: Localized twitch response   Therex: Sidelying AROM Rt shoulder abduction x 10 Sidelying AROM Rt shoulder ER x 10  Isometric reactive static holds in supine 90 deg shoulder flexion all directions c mild resistance 15 sec x 4  Estim IFC to Rt shoulder 10 mins for pain reduction to tolerance level. No adverse  reactions.  TODAY'S TREATMENT:                                                                                                                          DATE:08/23/2022 Manual: Compression to Rt infraspinatus  Trigger Point Dry-Needling  Treatment instructions: Expect mild to moderate muscle soreness. S/S of pneumothorax if dry  needled over a lung field, and to seek immediate medical attention should they occur. Patient verbalized understanding of these instructions and education.  Patient Consent Given: Yes Education handout provided: Yes Muscles treated: Rt infraspinatus  Treatment response/outcome: Localized twitch response   Therex: Tband rows green 2 x 10 Tband gh ext green 2 x 10   Estim IFC to Rt shoulder 10 mins for pain reduction to tolerance level. No adverse reactions.    PATIENT EDUCATION: 08/17/2022 Education details: HEP update Person educated: Patient Education method: Consulting civil engineer, Demonstration, Verbal cues, and Handouts Education comprehension: verbalized understanding, returned demonstration, and verbal cues required  HOME EXERCISE PROGRAM: Access Code: [email or web address] URL: [email or web address] Date: 08/19/2022 Prepared by: Scot Jun  Exercises - Seated Scapular Retraction  - 3-5 x daily - 7 x weekly - 1 sets - 10 reps - 3-5 hold - Standing Isometric Shoulder External Rotation with Doorway (Mirrored)  - 2 x daily - 7 x weekly - 1 sets - 10 reps - 5 hold - Standing Shoulder Posterior Capsule Stretch (Mirrored)  - 2 x daily - 7 x weekly - 1 sets - 3-5 reps - 15 hold - Seated 55 Gallon Barrel Hug Stretch  - 2 x daily - 7 x weekly - 1 sets - 10 reps - 5 hold - Supine Shoulder Flexion PROM (Mirrored)  - 2-3 x daily - 7 x weekly - 1-2 sets - 10 reps - 5 hold - Supine Shoulder Flexion Extension AAROM with Dowel  - 2-3 x daily - 7 x weekly - 1-2 sets - 10-15 reps - 3 hold - Supine Scapular Protraction in Flexion with Dumbbells  - 1-2 x daily - 7 x weekly  - 1 sets - 10 reps - 3 hold - Standing Isometric Shoulder Internal Rotation at Doorway  - 1-2 x daily - 7 x weekly - 1 sets - 15 reps - 5 hold - Standing Isometric Shoulder Flexion with Doorway - Arm Bent  - 1-2 x daily - 7 x weekly - 1 sets - 10 reps - 5-10 hold  ASSESSMENT:  CLINICAL IMPRESSION: Overall pt has progressed well with PT, but still with continued pain affecting functional mobility.  Pt scheduled appt with Dr. Rolena Infante for possible injection to see if this helps with her pain.  Will continue to benefit from PT to maximize function.  OBJECTIVE IMPAIRMENTS: decreased activity tolerance, decreased coordination, decreased endurance, decreased strength, increased fascial restrictions, impaired perceived functional ability, improper body mechanics, and pain.   ACTIVITY LIMITATIONS: carrying, lifting, reach over head, and hygiene/grooming  PARTICIPATION LIMITATIONS: meal prep, cleaning, laundry, interpersonal relationship, and community activity  PERSONAL FACTORS: no specific factors are affecting patient's functional outcome.   REHAB POTENTIAL: Good  CLINICAL DECISION MAKING: Stable/uncomplicated  EVALUATION COMPLEXITY: Low   GOALS: Goals reviewed with patient? Yes  SHORT TERM GOALS: (target date for Short term goals are 3 weeks 08/03/2022)  1.Patient will demonstrate independent use of home exercise program to maintain progress from in clinic treatments. Goal status: partialy met  LONG TERM GOALS: (target dates for all long term goals are 10 weeks 09/21/2022 )   1. Patient will demonstrate/report pain at worst less than or equal to 2/10 to facilitate minimal limitation in daily activity secondary to pain symptoms. Goal status: on going 08/26/2022   2. Patient will demonstrate independent use of home exercise program to facilitate ability to maintain/progress functional gains from skilled physical therapy services. Goal status: on going 08/26/2022   3. Patient will  demonstrate FOTO  outcome > or = 62 % to indicate reduced disability due to condition. Goal status: on going 08/26/2022   4.  Patient will demonstrate Rt UE MMT 5/5 throughout to facilitate lifting, reaching, carrying at [facility] in daily activity.   Goal status: on going 08/26/2022   5.  Patient will demonstrate Rt GH joint AROM WFL s symptoms to facilitate usual overhead reaching, self care, dressing at PLOF.    Goal status: on going 08/26/2022   6.  Patient will demonstrate/report ability to sleep s restriction. Goal status: MET 08/26/2022   PLAN:  PT FREQUENCY: 1-2x/week  PT DURATION: 10 weeks  PLANNED INTERVENTIONS: Therapeutic exercises, Therapeutic activity, Neuro Muscular re-education, Balance training, Gait training, Patient/Family education, Joint mobilization, Stair training, DME instructions, Dry Needling, Electrical stimulation, Traction, Cryotherapy, vasopneumatic device Moist heat, Taping, Ultrasound, Ionotophoresis '4mg'$ /ml Dexamethasone, and Manual therapy.  All included unless contraindicated  PLAN FOR NEXT SESSION:  send update to Dr. Rolena Infante as she will see 1/25 for possible injection,  Dry needling, estim as desired based off symptoms.  Promote improved strengthening for Rt shoulder use.    Laureen Abrahams, PT, DPT 09/02/22 3:52 PM

## 2022-09-06 ENCOUNTER — Ambulatory Visit (INDEPENDENT_AMBULATORY_CARE_PROVIDER_SITE_OTHER): Payer: Medicare Other | Admitting: Rehabilitative and Restorative Service Providers"

## 2022-09-06 ENCOUNTER — Encounter: Payer: Self-pay | Admitting: Rehabilitative and Restorative Service Providers"

## 2022-09-06 DIAGNOSIS — M6281 Muscle weakness (generalized): Secondary | ICD-10-CM

## 2022-09-06 DIAGNOSIS — M25511 Pain in right shoulder: Secondary | ICD-10-CM

## 2022-09-06 DIAGNOSIS — G8929 Other chronic pain: Secondary | ICD-10-CM

## 2022-09-06 NOTE — Therapy (Signed)
OUTPATIENT PHYSICAL THERAPY TREATMENT NOTE   Patient Name: Cassidy Garcia MRN: [id number] DOB:[birth year], 77 y.o., female Today's Date: 09/06/2022   END OF SESSION:   PT End of Session - 09/06/22 1524     Visit Number 13    Number of Visits 20    Date for PT Re-Evaluation 09/22/22    Authorization Type Medicare and BCBS Federal    Progress Note Due on Visit 20    PT Start Time 1519    PT Stop Time 1558    PT Time Calculation (min) 39 min    Activity Tolerance Patient tolerated treatment well    Behavior During Therapy WFL for tasks assessed/performed                        Past Medical History:  Diagnosis Date   Arthritis    Asthma    Bronchitis 03/2016   Chronic kidney disease    stage 3   Colon polyps    tubular adenoma   Constipation    Depression    Diverticulosis    GERD (gastroesophageal reflux disease)    Hemorrhoids    Hyperlipidemia    Hypertension    Hypothyroidism    Shortness of breath    with exertion on occasion   Sinusitis    Skin cancer    Past Surgical History:  Procedure Laterality Date   ABDOMINAL HYSTERECTOMY     CARPOMETACARPAL ([place]) FUSION OF THUMB Bilateral    CATARACT EXTRACTION, BILATERAL     CHOLECYSTECTOMY     COLONOSCOPY  02/12/2011   HAMMER TOE SURGERY Right    LAPAROSCOPIC APPENDECTOMY N/A 08/26/2021   Procedure: APPENDECTOMY LAPAROSCOPIC WITH LYSIS OF ADHESIONS;  Surgeon: Tamyrah Burbage Boston, MD;  Location: [facility];  Service: General;  Laterality: N/A;   NASAL SINUS SURGERY     POLYPECTOMY     TOTAL HIP ARTHROPLASTY  12/10/2011   Procedure: TOTAL HIP ARTHROPLASTY ANTERIOR APPROACH;  Surgeon: Mcarthur Rossetti, MD;  Location: [facility];  Service: Orthopedics;  Laterality: Right;  Right Total Hip Arthroplasty, Anterior Approach   Patient Active Problem List   Diagnosis Date Noted   Rotator cuff tendonitis, right 07/02/2022   Nausea 12/02/2021   Bright red rectal bleeding 09/15/2021   History of colonic polyps  07/13/2021   Gastroenteritis 09/09/2020   Anemia 03/06/2020   Hypothyroidism 02/04/2020   Depression 01/24/2020   Asthma 01/24/2020   CKD (chronic kidney disease), stage III ([place]) 01/24/2020   Acute on chronic renal failure (HCC) 01/24/2020   Constipation 12/20/2018   Pain in right hip 06/29/2017   Trochanteric bursitis, right hip 06/29/2017   History of total replacement of right hip 06/29/2017   Hemorrhoids 03/01/2017   Great toe pain, left 12/04/2016   Chronic right shoulder pain 12/04/2016   Non-toxic multinodular goiter 05/14/2013   Degenerative arthritis of hip 12/10/2011   HTN (hypertension) 07/06/2011   Hyperlipidemia 07/06/2011   Gastro-esophageal reflux disease without esophagitis 09/23/2009     THERAPY DIAG:  Chronic right shoulder pain  Muscle weakness (generalized)   PCP: Burnard Bunting MD  REFERRING PROVIDER: Persons, Bevely Palmer, [place]  REFERRING DIAG: [phone] (ICD-10-CM) - Chronic right shoulder pain  EVAL THERAPY DIAG:  Chronic right shoulder pain  Muscle weakness (generalized)  Rationale for Evaluation and Treatment: Rehabilitation  ONSET DATE: Sept 2023  SUBJECTIVE:  SUBJECTIVE STATEMENT: She indicated no pain in shoulder upon arrival.  She indicated adjusting sitting position seemed to help some with sitting at end of day.   PERTINENT HISTORY: CKD, history of chronic back pain.  History of physical therapy for back/hip.   PAIN:  NPRS scale: 6/10 at worst over weekend.  Pain location: Rt shoulder   Pain description: reaching movements but at rest too at night  Aggravating factors: reaching overhead/lifting items Relieving factors: Voltaren gel, patches, tylenol  PRECAUTIONS: None  WEIGHT BEARING RESTRICTIONS: No  FALLS:  Has patient fallen in last 6  months? No  LIVING ENVIRONMENT: Lives in: House/apartment Stairs: has stairs at home  OCCUPATION: No work  PLOF: Independent, Rt hand dominant, read, puzzles, household activity, no yard work.   PATIENT GOALS:Reduce pain, being able to lift  Next MD visit:  none listed  OBJECTIVE:   PATIENT SURVEYS:  08/26/2022: foto update:  54  07/14/2022 FOTO intake:  54   predicted:  62  COGNITION: 07/14/2022 Overall cognitive status: WFL     SENSATION: 07/14/2022 WFL  POSTURE: 07/14/2022 Mild scapular protraction/FHP  UPPER EXTREMITY ROM:   07/14/2022:  Against gravity end range pain Rt shoulder in flexion/abduction, WFL.No limits in supine and WFL in all directions.    ROM Right 07/14/2022 Left 07/14/2022 Right 08/17/2022(post new pain onset) Right 08/19/2022 Right  08/26/2022  Shoulder flexion 145 (sitting)  80 AROM against gravity 95 AROM in supine 160 AROM in supine 165 AROM sitting  Shoulder extension       Shoulder abduction 120 (sitting)    150 AROM sitting  Shoulder adduction       Shoulder internal rotation       Shoulder external rotation       (Blank rows = not tested)  UPPER EXTREMITY MMT:  MMT Right 07/14/2022 Left 07/14/2022 Right 08/04/22 Right 08/26/2022  Shoulder flexion 4/5 c pain 5/5 4/5 with pain 5/5  Shoulder extension      Shoulder abduction 4/5 c pain 5/5 4/5  pain 5/5  Shoulder adduction      Shoulder internal rotation 5/5 5/5  5/5  Shoulder external rotation 4/5 c pain 5/5 4/5  pain 4/5 c pain  Middle trapezius      Lower trapezius      (Blank rows = not tested)  SHOULDER SPECIAL TESTS: 08/17/2022:  negative popeye's Rt.  Pain noted c biceps loading testing, Mild shrug sign noted.   07/14/2022 (-) Rt empty can, painful arc  JOINT MOBILITY TESTING:  07/14/2022 No restriction  PALPATION:  08/23/2022:  Strong and numerous trigger points in Rt infraspinatus, supraspinatus  07/14/2022 Trigger point  TODAY'S TREATMENT:                                                                                                          DATE:09/06/2022 Therex: Supine stabilization against mild clinician resistance in supine 90 deg flexion 15 sec bouts x 6 Standing green band rows bilateral x 20 Standing green band GH ext x 20  Standing c ER isometric reactive against  green band c towel under arm 5 sec hold x10, ER AROM x 5 (stopped due to difficulty)   Standing eccentric only lowering flexion Rt shoulder 90 - 0 deg 1 lb x 3, x 7 0 lbs   TODAY'S TREATMENT:                                                                                                         DATE:09/02/2022 Therex: Supine Rt shoulder flexion 2x10; 1# Supine circles in 90 deg Rt shoulder flexion 1 lb x 30 each way (cw, ccw) Sidelying Rt shoulder abduction 2x10; 1# Sidelying Rt shoulder ER 2x10; 1#  Standing short lever arm Rt shoulder flexion 1# x 10 reps; then performed in elevated supine position x 10 reps Rows L3 band 2x10 Wall ladder flexion on Rt x 5 reps    TODAY'S TREATMENT:                                                                                                         DATE:08/30/2022 Therex: Seated Rt shoulder ER isometric 5 sec hold x 10 against manual resistance in submax/painfree level  Supine AROM Rt shoulder 1 lb x 15 Supine circles in 90 deg Rt shoulder flexion 2 lb 2 x 30 each way (cw, ccw)  Sidelying AROM Rt shoulder abduction x 15 0 lb, x 10 1 lb Sidelying AROM Rt shoulder ER x 10 1 lb, x 10 0 lb    Estim IFC to Rt shoulder 10 mins for pain reduction to tolerance level. No adverse reactions.    PATIENT EDUCATION: 08/17/2022 Education details: HEP update Person educated: Patient Education method: Consulting civil engineer, Demonstration, Verbal cues, and Handouts Education comprehension: verbalized understanding, returned demonstration, and verbal cues required  HOME EXERCISE PROGRAM: Access Code: [email or web address] URL:  [email or web address] Date: 08/19/2022 Prepared by: Scot Jun  Exercises - Seated Scapular Retraction  - 3-5 x daily - 7 x weekly - 1 sets - 10 reps - 3-5 hold - Standing Isometric Shoulder External Rotation with Doorway (Mirrored)  - 2 x daily - 7 x weekly - 1 sets - 10 reps - 5 hold - Standing Shoulder Posterior Capsule Stretch (Mirrored)  - 2 x daily - 7 x weekly - 1 sets - 3-5 reps - 15 hold - Seated 55 Gallon Barrel Hug Stretch  - 2 x daily - 7 x weekly - 1 sets - 10 reps - 5 hold - Supine Shoulder Flexion PROM (Mirrored)  - 2-3 x daily - 7 x weekly - 1-2 sets - 10 reps - 5 hold - Supine Shoulder Flexion Extension AAROM with Dowel  - 2-3 x daily -  7 x weekly - 1-2 sets - 10-15 reps - 3 hold - Supine Scapular Protraction in Flexion with Dumbbells  - 1-2 x daily - 7 x weekly - 1 sets - 10 reps - 3 hold - Standing Isometric Shoulder Internal Rotation at Doorway  - 1-2 x daily - 7 x weekly - 1 sets - 15 reps - 5 hold - Standing Isometric Shoulder Flexion with Doorway - Arm Bent  - 1-2 x daily - 7 x weekly - 1 sets - 10 reps - 5-10 hold  ASSESSMENT:  CLINICAL IMPRESSION: Attempts today to progress strength further, with mixed symptom response.  Adjusted levels of resistance accordingly.  Overall quality of active movement continued to show improvement.   OBJECTIVE IMPAIRMENTS: decreased activity tolerance, decreased coordination, decreased endurance, decreased strength, increased fascial restrictions, impaired perceived functional ability, improper body mechanics, and pain.   ACTIVITY LIMITATIONS: carrying, lifting, reach over head, and hygiene/grooming  PARTICIPATION LIMITATIONS: meal prep, cleaning, laundry, interpersonal relationship, and community activity  PERSONAL FACTORS: no specific factors are affecting patient's functional outcome.   REHAB POTENTIAL: Good  CLINICAL DECISION MAKING: Stable/uncomplicated  EVALUATION COMPLEXITY: Low   GOALS: Goals  reviewed with patient? Yes  SHORT TERM GOALS: (target date for Short term goals are 3 weeks 08/03/2022)  1.Patient will demonstrate independent use of home exercise program to maintain progress from in clinic treatments. Goal status: partialy met  LONG TERM GOALS: (target dates for all long term goals are 10 weeks 09/21/2022 )   1. Patient will demonstrate/report pain at worst less than or equal to 2/10 to facilitate minimal limitation in daily activity secondary to pain symptoms. Goal status: on going 08/26/2022   2. Patient will demonstrate independent use of home exercise program to facilitate ability to maintain/progress functional gains from skilled physical therapy services. Goal status: on going 08/26/2022   3. Patient will demonstrate FOTO outcome > or = 62 % to indicate reduced disability due to condition. Goal status: on going 08/26/2022   4.  Patient will demonstrate Rt UE MMT 5/5 throughout to facilitate lifting, reaching, carrying at [facility] in daily activity.   Goal status: on going 08/26/2022   5.  Patient will demonstrate Rt GH joint AROM WFL s symptoms to facilitate usual overhead reaching, self care, dressing at PLOF.    Goal status: on going 08/26/2022   6.  Patient will demonstrate/report ability to sleep s restriction. Goal status: MET 08/26/2022   PLAN:  PT FREQUENCY: 1-2x/week  PT DURATION: 10 weeks  PLANNED INTERVENTIONS: Therapeutic exercises, Therapeutic activity, Neuro Muscular re-education, Balance training, Gait training, Patient/Family education, Joint mobilization, Stair training, DME instructions, Dry Needling, Electrical stimulation, Traction, Cryotherapy, vasopneumatic device Moist heat, Taping, Ultrasound, Ionotophoresis '4mg'$ /ml Dexamethasone, and Manual therapy.  All included unless contraindicated  PLAN FOR NEXT SESSION:  Progressive strengthening as tolerated. Follow up on MD viist.    Scot Jun, PT, DPT, OCS, ATC 09/06/22  4:11 PM

## 2022-09-09 ENCOUNTER — Ambulatory Visit: Payer: Medicare Other | Admitting: Sports Medicine

## 2022-09-09 ENCOUNTER — Encounter: Payer: Medicare Other | Admitting: Rehabilitative and Restorative Service Providers"

## 2022-09-13 ENCOUNTER — Encounter: Payer: Self-pay | Admitting: Orthopaedic Surgery

## 2022-09-13 DIAGNOSIS — G8929 Other chronic pain: Secondary | ICD-10-CM

## 2022-09-14 ENCOUNTER — Encounter: Payer: Self-pay | Admitting: Sports Medicine

## 2022-09-14 ENCOUNTER — Ambulatory Visit: Payer: Self-pay

## 2022-09-14 ENCOUNTER — Ambulatory Visit (INDEPENDENT_AMBULATORY_CARE_PROVIDER_SITE_OTHER): Payer: Medicare Other | Admitting: Sports Medicine

## 2022-09-14 DIAGNOSIS — M25511 Pain in right shoulder: Secondary | ICD-10-CM

## 2022-09-14 DIAGNOSIS — G8929 Other chronic pain: Secondary | ICD-10-CM

## 2022-09-14 DIAGNOSIS — M19011 Primary osteoarthritis, right shoulder: Secondary | ICD-10-CM

## 2022-09-14 MED ORDER — METHYLPREDNISOLONE ACETATE 40 MG/ML IJ SUSP
80.0000 mg | INTRAMUSCULAR | Status: AC | PRN
  Administered 2022-09-14: 80 mg via INTRA_ARTICULAR

## 2022-09-14 MED ORDER — BUPIVACAINE HCL 0.25 % IJ SOLN
2.0000 mL | INTRAMUSCULAR | Status: AC | PRN
  Administered 2022-09-14: 2 mL via INTRA_ARTICULAR

## 2022-09-14 MED ORDER — LIDOCAINE HCL 1 % IJ SOLN
2.0000 mL | INTRAMUSCULAR | Status: AC | PRN
  Administered 2022-09-14: 2 mL

## 2022-09-14 NOTE — Progress Notes (Signed)
Cassidy Garcia - 77 y.o. female MRN [id number]  Date of birth: 03/15/1946  Office Visit Note: Visit Date: 09/14/2022 PCP: Burnard Bunting, MD Referred by: Mcarthur Rossetti*  Subjective: Chief Complaint  Patient presents with   Right Shoulder - Pain   HPI: Cassidy Garcia is a pleasant 77 y.o. female who presents today for chronic right shoulder pain.  She she has had chronic right shoulder pain, although has worsened over the last couple months.  She is doing formalized physical therapy.  With Faustino Congress, she is making progress, but they mention something about possibly having an injection to see if this could improve her pain and motion to make physical therapy more successful.  Limits her over-the-counter Tylenol and ibuprofen.   Pertinent ROS were reviewed with the patient and found to be negative unless otherwise specified above in HPI.   Assessment & Plan: Visit Diagnoses:  1. Chronic right shoulder pain   2. Primary osteoarthritis, right shoulder    Plan: Discussed with [name] that based on her x-ray findings and recommendation from Dr. Ninfa Linden (via [place]), I think the most effective treatment would be the ultrasound-guided glenohumeral joint injection.  She agreed to this and did tolerate well.  She will use ice and over-the-counter anti-inflammatories for any postinjection pain.  Would like her to rest from PT and lifting for the next 48 hours, then may return back to physical therapy.  I did discuss with her based on her exam and some of her cystic changes in the humeral head she may have a degree of impingement.  If for some reason she does not get significant relief from the glenohumeral joint injection, we could always consider a subacromial joint injection at a later time.  For now, she will continue with physical therapy and follow-up with Dr. Ninfa Linden for the shoulder.  If she wishes to see me back at any point, I would welcome this.  Follow-up: Return for with  Dr. Ninfa Linden for shoulder as needed.   Meds & Orders: No orders of the defined types were placed in this encounter.   Orders Placed This Encounter  Procedures   Large Joint Inj   US Guided Needle Placement - No Linked Charges     Procedures: Large Joint Inj: R glenohumeral on 09/14/2022 3:06 PM Indications: pain Details: 22 G 3.5 in needle, ultrasound-guided posterior approach Medications: 2 mL lidocaine 1 %; 2 mL bupivacaine 0.25 %; 80 mg methylPREDNISolone acetate 40 MG/ML Outcome: tolerated well, no immediate complications  US-guided glenohumeral joint injection, right shoulder After discussion on risks/benefits/indications, informed verbal consent was obtained. A timeout was then performed. The patient was positioned lying lateral recumbent on examination table. The patient's shoulder was prepped with betadine and multiple alcohol swabs and utilizing ultrasound guidance, the patient's glenohumeral joint was identified on ultrasound. Using ultrasound guidance a 22-gauge, 3.5 inch needle with a mixture of 2:2:2 cc's lidocaine:bupivicaine:depomedrol was directed from a lateral to medial direction via in-plane technique into the glenohumeral joint with visualization of appropriate spread of injectate into the joint. Patient tolerated the procedure well without immediate complications.      Procedure, treatment alternatives, risks and benefits explained, specific risks discussed. Consent was given by the patient. Immediately prior to procedure a time out was called to verify the correct patient, procedure, equipment, support staff and site/side marked as required. Patient was prepped and draped in the usual sterile fashion.          Clinical History:  She reports that she quit smoking about 21 years ago. Her smoking use included cigarettes. She has never used smokeless tobacco. No results for input(s): "HGBA1C", "LABURIC" in the last 8760 hours.  Objective:   Physical Exam  Gen:  Well-appearing, in no acute distress; non-toxic CV:  Well-perfused. Warm.  Resp: Breathing unlabored on room air; no wheezing. Psych: Fluid speech in conversation; appropriate affect; normal thought process Neuro: Sensation intact throughout. No gross coordination deficits.   Ortho Exam - Right shoulder: + TTP noted over the anterior shoulder joint, or AC joint TTP.  There is some mild restriction in activity and passive and active internal range of motion with some mild grinding.  Slightly positive drop arm test.  Some pain with resisted ER and full range of internal rotation.  Neurovascular intact.  Imaging:  *Independent interpretation of right shoulder x-ray from 07/02/2022 was performed by myself today.  3 views of the right shoulder were performed.  There is mild to moderate glenohumeral joint arthritis.  There is some cystic change over the superolateral humeral head, possibly indicative of impingement like syndrome.  Mild AC joint arthritic changes.  Humeral head is well located.   Past Medical/Family/Surgical/Social History: Medications & Allergies reviewed per EMR, new medications updated. Patient Active Problem List   Diagnosis Date Noted   Rotator cuff tendonitis, right 07/02/2022   Nausea 12/02/2021   Bright red rectal bleeding 09/15/2021   History of colonic polyps 07/13/2021   Gastroenteritis 09/09/2020   Anemia 03/06/2020   Hypothyroidism 02/04/2020   Depression 01/24/2020   Asthma 01/24/2020   CKD (chronic kidney disease), stage III ([place]) 01/24/2020   Acute on chronic renal failure (HCC) 01/24/2020   Constipation 12/20/2018   Pain in right hip 06/29/2017   Trochanteric bursitis, right hip 06/29/2017   History of total replacement of right hip 06/29/2017   Hemorrhoids 03/01/2017   Great toe pain, left 12/04/2016   Chronic right shoulder pain 12/04/2016   Non-toxic multinodular goiter 05/14/2013   Degenerative arthritis of hip 12/10/2011   HTN (hypertension)  07/06/2011   Hyperlipidemia 07/06/2011   Gastro-esophageal reflux disease without esophagitis 09/23/2009   Past Medical History:  Diagnosis Date   Arthritis    Asthma    Bronchitis 03/2016   Chronic kidney disease    stage 3   Colon polyps    tubular adenoma   Constipation    Depression    Diverticulosis    GERD (gastroesophageal reflux disease)    Hemorrhoids    Hyperlipidemia    Hypertension    Hypothyroidism    Shortness of breath    with exertion on occasion   Sinusitis    Skin cancer    Family History  Problem Relation Age of Onset   Ovarian cancer Mother    Anxiety disorder Mother    Heart disease Paternal Grandmother    Other Paternal Aunt        brain tumor   Birth defects Sister        no iris   Depression Sister    Anxiety disorder Sister    Colon cancer Neg Hx    Past Surgical History:  Procedure Laterality Date   ABDOMINAL HYSTERECTOMY     CARPOMETACARPAL ([place]) FUSION OF THUMB Bilateral    CATARACT EXTRACTION, BILATERAL     CHOLECYSTECTOMY     COLONOSCOPY  02/12/2011   HAMMER TOE SURGERY Right    LAPAROSCOPIC APPENDECTOMY N/A 08/26/2021   Procedure: APPENDECTOMY LAPAROSCOPIC WITH LYSIS OF ADHESIONS;  Surgeon:  Michael Boston, MD;  Location: [facility];  Service: General;  Laterality: N/A;   NASAL SINUS SURGERY     POLYPECTOMY     TOTAL HIP ARTHROPLASTY  12/10/2011   Procedure: TOTAL HIP ARTHROPLASTY ANTERIOR APPROACH;  Surgeon: Mcarthur Rossetti, MD;  Location: [facility];  Service: Orthopedics;  Laterality: Right;  Right Total Hip Arthroplasty, Anterior Approach   Social History   Occupational History   Occupation: retired    Fish farm manager: RETIRED  Tobacco Use   Smoking status: Former    Types: Cigarettes    Quit date: 08/16/2001    Years since quitting: 21.0   Smokeless tobacco: Never  Vaping Use   Vaping Use: Never used  Substance and Sexual Activity   Alcohol use: Not Currently    Comment: rare   Drug use: No   Sexual activity: Not on file

## 2022-09-27 ENCOUNTER — Encounter: Payer: Self-pay | Admitting: Physical Medicine and Rehabilitation

## 2022-09-27 ENCOUNTER — Encounter: Payer: Self-pay | Admitting: Rehabilitative and Restorative Service Providers"

## 2022-09-27 ENCOUNTER — Ambulatory Visit (INDEPENDENT_AMBULATORY_CARE_PROVIDER_SITE_OTHER): Payer: Medicare Other | Admitting: Rehabilitative and Restorative Service Providers"

## 2022-09-27 DIAGNOSIS — G8929 Other chronic pain: Secondary | ICD-10-CM | POA: Diagnosis not present

## 2022-09-27 DIAGNOSIS — M25511 Pain in right shoulder: Secondary | ICD-10-CM | POA: Diagnosis not present

## 2022-09-27 DIAGNOSIS — M6281 Muscle weakness (generalized): Secondary | ICD-10-CM

## 2022-09-27 NOTE — Therapy (Signed)
OUTPATIENT PHYSICAL THERAPY TREATMENT NOTE/PROGRESS NOTE/RECERT   Patient Name: Cassidy Garcia MRN: [id number] DOB:January 20, 1946, 77 y.o., female Today's Date: 09/27/2022   Progress Note Reporting Period 08/26/2022 to 09/27/2022  See note below for Objective Data and Assessment of Progress/Goals.   END OF SESSION:   PT End of Session - 09/27/22 1149     Visit Number 14    Number of Visits 33    Date for PT Re-Evaluation 12/06/22    Authorization Type Medicare and BCBS Federal    Progress Note Due on Visit 24    PT Start Time 1141    PT Stop Time 1220    PT Time Calculation (min) 39 min    Activity Tolerance Patient limited by pain    Behavior During Therapy [facility] for tasks assessed/performed              Past Medical History:  Diagnosis Date   Arthritis    Asthma    Bronchitis 03/2016   Chronic kidney disease    stage 3   Colon polyps    tubular adenoma   Constipation    Depression    Diverticulosis    GERD (gastroesophageal reflux disease)    Hemorrhoids    Hyperlipidemia    Hypertension    Hypothyroidism    Shortness of breath    with exertion on occasion   Sinusitis    Skin cancer    Past Surgical History:  Procedure Laterality Date   ABDOMINAL HYSTERECTOMY     CARPOMETACARPAL ([place]) FUSION OF THUMB Bilateral    CATARACT EXTRACTION, BILATERAL     CHOLECYSTECTOMY     COLONOSCOPY  02/12/2011   HAMMER TOE SURGERY Right    LAPAROSCOPIC APPENDECTOMY N/A 08/26/2021   Procedure: APPENDECTOMY LAPAROSCOPIC WITH LYSIS OF ADHESIONS;  Surgeon: Lyndell Allaire Boston, MD;  Location: [facility];  Service: General;  Laterality: N/A;   NASAL SINUS SURGERY     POLYPECTOMY     TOTAL HIP ARTHROPLASTY  12/10/2011   Procedure: TOTAL HIP ARTHROPLASTY ANTERIOR APPROACH;  Surgeon: Mcarthur Rossetti, MD;  Location: [facility];  Service: Orthopedics;  Laterality: Right;  Right Total Hip Arthroplasty, Anterior Approach   Patient Active Problem List   Diagnosis Date Noted   Rotator cuff  tendonitis, right 07/02/2022   Nausea 12/02/2021   Bright red rectal bleeding 09/15/2021   History of colonic polyps 07/13/2021   Gastroenteritis 09/09/2020   Anemia 03/06/2020   Hypothyroidism 02/04/2020   Depression 01/24/2020   Asthma 01/24/2020   CKD (chronic kidney disease), stage III ([place]) 01/24/2020   Acute on chronic renal failure (HCC) 01/24/2020   Constipation 12/20/2018   Pain in right hip 06/29/2017   Trochanteric bursitis, right hip 06/29/2017   History of total replacement of right hip 06/29/2017   Hemorrhoids 03/01/2017   Great toe pain, left 12/04/2016   Chronic right shoulder pain 12/04/2016   Non-toxic multinodular goiter 05/14/2013   Degenerative arthritis of hip 12/10/2011   HTN (hypertension) 07/06/2011   Hyperlipidemia 07/06/2011   Gastro-esophageal reflux disease without esophagitis 09/23/2009     THERAPY DIAG:  Chronic right shoulder pain  Muscle weakness (generalized)   PCP: Burnard Bunting MD  REFERRING PROVIDER: Persons, Bevely Palmer, [place]  REFERRING DIAG: [id number] (ICD-10-CM) - Chronic right shoulder pain  EVAL THERAPY DIAG:  Chronic right shoulder pain  Muscle weakness (generalized)  Rationale for Evaluation and Treatment: Rehabilitation  ONSET DATE: Sept 2023  SUBJECTIVE:  SUBJECTIVE STATEMENT: She indicated she was doing really well prior to last Thursday when she reached to pull dress out from under her and it created pain "ripping."  She indicated sharp pain then and then a little better over the weekend with a continued pain at night (taking medicine for it).   She indicated having the injection on Jan 30.     PERTINENT HISTORY: CKD, history of chronic back pain.  History of physical therapy for back/hip.   PAIN:  NPRS scale: at worst prior to  incident 3-4/10.  At worst last 1-2 days : 7/10 Pain location: Rt shoulder   Pain description: reaching movements but at rest too at night  Aggravating factors: pulling dress out incident, lifting, nighttime/lying down Relieving factors: Voltaren gel, patches, tylenol  PRECAUTIONS: None  WEIGHT BEARING RESTRICTIONS: No  FALLS:  Has patient fallen in last 6 months? No  LIVING ENVIRONMENT: Lives in: House/apartment Stairs: has stairs at home  OCCUPATION: No work  PLOF: Independent, Rt hand dominant, read, puzzles, household activity, no yard work.   PATIENT GOALS:Reduce pain, being able to lift   OBJECTIVE:   PATIENT SURVEYS:  07/14/2022 FOTO intake:  54   predicted:  62  08/26/2022: foto update:  54    COGNITION: 07/14/2022 Overall cognitive status: WFL     SENSATION: 07/14/2022 WFL  POSTURE: 07/14/2022 Mild scapular protraction/FHP  UPPER EXTREMITY ROM:   07/14/2022:  Against gravity end range pain Rt shoulder in flexion/abduction, WFL.No limits in supine and WFL in all directions.    ROM Right 07/14/2022 Left 07/14/2022 Right 08/17/2022(post new pain onset) Right 08/19/2022 Right  08/26/2022 Right 09/27/2022  Shoulder flexion 145 (sitting)  80 AROM against gravity 95 AROM in supine 160 AROM in supine 165 AROM sitting WF in sitting against gravity (catch in eccentric lowering)  Shoulder extension        Shoulder abduction 120 (sitting)    150 AROM sitting   Shoulder adduction        Shoulder internal rotation        Shoulder external rotation        (Blank rows = not tested)  UPPER EXTREMITY MMT:  MMT Right 07/14/2022 Left 07/14/2022 Right 08/04/22 Right 08/26/2022 Right 09/27/2022  Shoulder flexion 4/5 c pain 5/5 4/5 with pain 5/5 4+/5 c pain  Shoulder extension       Shoulder abduction 4/5 c pain 5/5 4/5  pain 5/5 4+/5 c pain  Shoulder adduction       Shoulder internal rotation 5/5 5/5  5/5 5/5  Shoulder external rotation 4/5 c pain 5/5 4/5   pain 4/5 c pain 3+/5 c pain  Middle trapezius       Lower trapezius       (Blank rows = not tested)  SHOULDER SPECIAL TESTS: 08/17/2022:  negative popeye's Rt.  Pain noted c biceps loading testing, Mild shrug sign noted.   07/14/2022 (-) Rt empty can, painful arc  JOINT MOBILITY TESTING:  07/14/2022 No restriction  PALPATION:  08/23/2022:  Strong and numerous trigger points in Rt infraspinatus, supraspinatus  07/14/2022 Trigger point  TODAY'S TREATMENT:  DATE: 09/27/2022 Therex: Review of existing HEP c adjustments based off symptom response today.    Standing green band rows  2x 15 Standing green band GH ext 2 x 15 Seated ER isometric 5 sec hold x 3 (cues to avoid pain response.  Seated green band biceps curls 2 x 10 Rt Supine active Rt shoulder flexion x 10   TODAY'S TREATMENT:                                                                                                         DATE:09/06/2022 Therex: Supine stabilization against mild clinician resistance in supine 90 deg flexion 15 sec bouts x 6 Standing green band rows bilateral x 20 Standing green band GH ext x 20  Standing c ER isometric reactive against green band c towel under arm 5 sec hold x10, ER AROM x 5 (stopped due to difficulty)   Standing eccentric only lowering flexion Rt shoulder 90 - 0 deg 1 lb x 3, x 7 0 lbs   TODAY'S TREATMENT:                                                                                                         DATE:09/02/2022 Therex: Supine Rt shoulder flexion 2x10; 1# Supine circles in 90 deg Rt shoulder flexion 1 lb x 30 each way (cw, ccw) Sidelying Rt shoulder abduction 2x10; 1# Sidelying Rt shoulder ER 2x10; 1#  Standing short lever arm Rt shoulder flexion 1# x 10 reps; then performed in elevated supine position x 10 reps Rows L3 band 2x10 Wall ladder flexion on Rt x 5  reps   PATIENT EDUCATION: 08/17/2022 Education details: HEP update Person educated: Patient Education method: Consulting civil engineer, Demonstration, Verbal cues, and Handouts Education comprehension: verbalized understanding, returned demonstration, and verbal cues required  HOME EXERCISE PROGRAM: Access Code: [email or web address] URL: [email or web address] Date: 09/27/2022 Prepared by: Scot Jun  Exercises - Seated Scapular Retraction  - 3-5 x daily - 7 x weekly - 1 sets - 10 reps - 3-5 hold - Standing Shoulder Posterior Capsule Stretch (Mirrored)  - 2 x daily - 7 x weekly - 1 sets - 3-5 reps - 15 hold - Seated 55 Gallon Barrel Hug Stretch  - 2 x daily - 7 x weekly - 1 sets - 10 reps - 5 hold - Supine Shoulder Flexion Extension AAROM with Dowel  - 2-3 x daily - 7 x weekly - 1-2 sets - 10-15 reps - 3 hold - Supine Scapular Protraction in Flexion with Dumbbells  - 1-2 x daily - 7 x weekly - 1 sets - 10 reps - 3 hold - Standing Isometric  Shoulder Internal Rotation at Doorway  - 1-2 x daily - 7 x weekly - 1 sets - 15 reps - 5 hold - Standing Isometric Shoulder External Rotation with Doorway (Mirrored)  - 2 x daily - 7 x weekly - 1 sets - 10 reps - 5 hold - Standing Isometric Shoulder Flexion with Doorway - Arm Bent  - 1-2 x daily - 7 x weekly - 1 sets - 10 reps - 5-10 hold - Standing Bilateral Low Shoulder Row with Anchored Resistance  - 1-2 x daily - 7 x weekly - 1-2 sets - 10-15 reps - Shoulder Extension with Resistance  - 1-2 x daily - 7 x weekly - 1-2 sets - 10-15 reps  ASSESSMENT:  CLINICAL IMPRESSION: Pt has attended 14 visits overall during course of treatment.  Pt presented c continued complaints of Rt shoulder pain c muscle weakness, worsened in last week following incident of pain while pulling.  Pt had reported period of improvement from skilled PT services prior to exacerbation.  Due to pain increase, recommend re certification at this time for skilled PT services continued.    OBJECTIVE IMPAIRMENTS: decreased activity tolerance, decreased coordination, decreased endurance, decreased strength, increased fascial restrictions, impaired perceived functional ability, improper body mechanics, and pain.   ACTIVITY LIMITATIONS: carrying, lifting, reach over head, and hygiene/grooming  PARTICIPATION LIMITATIONS: meal prep, cleaning, laundry, interpersonal relationship, and community activity  PERSONAL FACTORS: no specific factors are affecting patient's functional outcome.   REHAB POTENTIAL: Good  CLINICAL DECISION MAKING: Stable/uncomplicated  EVALUATION COMPLEXITY: Low   GOALS: Goals reviewed with patient? Yes  SHORT TERM GOALS: (target date for Short term goals are 3 weeks 08/03/2022)  1.Patient will demonstrate independent use of home exercise program to maintain progress from in clinic treatments. Goal status: partialy met  LONG TERM GOALS: (target dates for all long term goals are 10 weeks 12/06/2022 )   1. Patient will demonstrate/report pain at worst less than or equal to 2/10 to facilitate minimal limitation in daily activity secondary to pain symptoms. Goal status: revised 09/27/2022   2. Patient will demonstrate independent use of home exercise program to facilitate ability to maintain/progress functional gains from skilled physical therapy services. Goal status: revised 09/27/2022   3. Patient will demonstrate FOTO outcome > or = 62 % to indicate reduced disability due to condition. Goal status: revised 09/27/2022   4.  Patient will demonstrate Rt UE MMT 5/5 throughout to facilitate lifting, reaching, carrying at [facility] in daily activity.   Goal status: revised 09/27/2022   5.  Patient will demonstrate Rt GH joint AROM WFL s symptoms to facilitate usual overhead reaching, self care, dressing at PLOF.    Goal status: revised 09/27/2022   6.  Patient will demonstrate/report ability to sleep s restriction. Goal status: revised 09/27/2022    PLAN:  PT FREQUENCY: 1-2x/week  PT DURATION: 10 weeks  PLANNED INTERVENTIONS: Therapeutic exercises, Therapeutic activity, Neuro Muscular re-education, Balance training, Gait training, Patient/Family education, Joint mobilization, Stair training, DME instructions, Dry Needling, Electrical stimulation, Traction, Cryotherapy, vasopneumatic device Moist heat, Taping, Ultrasound, Ionotophoresis 66m/ml Dexamethasone, and Manual therapy.  All included unless contraindicated  PLAN FOR NEXT SESSION:  Progressive strengthening as tolerated.  Recheck symptom response.    MScot Jun PT, DPT, OCS, ATC 09/27/22  12:57 PM

## 2022-09-28 ENCOUNTER — Other Ambulatory Visit: Payer: Self-pay | Admitting: Physical Medicine and Rehabilitation

## 2022-09-28 DIAGNOSIS — M48062 Spinal stenosis, lumbar region with neurogenic claudication: Secondary | ICD-10-CM

## 2022-09-28 DIAGNOSIS — M5416 Radiculopathy, lumbar region: Secondary | ICD-10-CM

## 2022-10-01 ENCOUNTER — Other Ambulatory Visit: Payer: Self-pay | Admitting: Physician Assistant

## 2022-10-04 ENCOUNTER — Other Ambulatory Visit: Payer: Self-pay | Admitting: Physical Medicine and Rehabilitation

## 2022-10-04 MED ORDER — ACETAMINOPHEN-CODEINE 300-30 MG PO TABS: 1.0000 | ORAL_TABLET | Freq: Three times a day (TID) | ORAL | 0 refills | Status: DC | PRN

## 2022-10-06 ENCOUNTER — Encounter: Payer: Medicare Other | Admitting: Rehabilitative and Restorative Service Providers"

## 2022-10-07 ENCOUNTER — Ambulatory Visit: Payer: Self-pay

## 2022-10-07 ENCOUNTER — Ambulatory Visit (INDEPENDENT_AMBULATORY_CARE_PROVIDER_SITE_OTHER): Payer: Medicare Other | Admitting: Physical Medicine and Rehabilitation

## 2022-10-07 VITALS — BP 121/78 | HR 83

## 2022-10-07 DIAGNOSIS — M5416 Radiculopathy, lumbar region: Secondary | ICD-10-CM | POA: Diagnosis not present

## 2022-10-07 DIAGNOSIS — M48062 Spinal stenosis, lumbar region with neurogenic claudication: Secondary | ICD-10-CM

## 2022-10-07 MED ORDER — METHYLPREDNISOLONE ACETATE 80 MG/ML IJ SUSP
80.0000 mg | Freq: Once | INTRAMUSCULAR | Status: AC
  Administered 2022-10-07: 80 mg

## 2022-10-07 NOTE — Patient Instructions (Signed)

## 2022-10-07 NOTE — Progress Notes (Signed)
Functional Pain Scale - descriptive words and definitions  Mild (2)   Noticeable when not distracted/no impact on ADL's/sleep only slightly affected and able to   use both passive and active distraction for comfort. Mild range order  Average Pain 8 with activity   +Driver, -BT, -Dye Allergies.  Lower back pain that radiates into left hip

## 2022-10-09 ENCOUNTER — Other Ambulatory Visit (HOSPITAL_COMMUNITY): Payer: Self-pay

## 2022-10-11 ENCOUNTER — Telehealth: Payer: Self-pay

## 2022-10-11 ENCOUNTER — Other Ambulatory Visit (HOSPITAL_COMMUNITY): Payer: Self-pay

## 2022-10-11 MED ORDER — ACETAMINOPHEN-CODEINE 300-30 MG PO TABS
1.0000 | ORAL_TABLET | Freq: Three times a day (TID) | ORAL | 0 refills | Status: DC | PRN
  Filled 2022-10-11: qty 15, 5d supply, fill #0

## 2022-10-11 NOTE — Telephone Encounter (Signed)
Cassidy Garcia from [facility] [employer] called triage.  Patient has been unable to get her Tylenol #3 filled through [facility] on [address] due to having none. I gave pharmacy a verbal. Just FYI

## 2022-10-13 NOTE — Progress Notes (Signed)
Cassidy Garcia - 77 y.o. female MRN [id number]  Date of birth: February 18, 1946  Office Visit Note: Visit Date: 10/07/2022 PCP: Burnard Bunting, MD Referred by: Burnard Bunting, MD  Subjective: No chief complaint on file.  HPI:  Cassidy Garcia is a 77 y.o. female who comes in today for planned repeat Bilateral L5-S1  Lumbar Transforaminal epidural steroid injection with fluoroscopic guidance.  The patient has failed conservative care including home exercise, medications, time and activity modification.  This injection will be diagnostic and hopefully therapeutic.  Please see requesting physician notes for further details and justification. Patient received more than 50% pain relief from prior injection.  Multilevel severe central stenosis and lateral recess stenosis of the lower levels of the lumbar spine.  Good relief with L5 transforaminal injection for 2 to 3 months.  Will repeat this today.  At 68 without much in the way of heart disease she likely would be a good candidate for decompression.  Referring: Barnet Pall, FNP   ROS Otherwise per HPI.  Assessment & Plan: Visit Diagnoses:    ICD-10-CM   1. Lumbar radiculopathy  M54.16 XR C-ARM NO REPORT    Epidural Steroid injection    methylPREDNISolone acetate (DEPO-MEDROL) injection 80 mg    2. Spinal stenosis of lumbar region with neurogenic claudication  M48.062       Plan: No additional findings.   Meds & Orders:  Meds ordered this encounter  Medications   methylPREDNISolone acetate (DEPO-MEDROL) injection 80 mg    Orders Placed This Encounter  Procedures   XR C-ARM NO REPORT   Epidural Steroid injection    Follow-up: Return for visit to requesting provider as needed.   Procedures: No procedures performed  Lumbosacral Transforaminal Epidural Steroid Injection - Sub-Pedicular Approach with Fluoroscopic Guidance  Patient: Cassidy Garcia      Date of Birth: 07/18/1946 MRN: [id number] PCP: Burnard Bunting, MD      Visit  Date: 10/07/2022   Universal Protocol:    Date/Time: 10/07/2022  Consent Given By: the patient  Position: PRONE  Additional Comments: Vital signs were monitored before and after the procedure. Patient was prepped and draped in the usual sterile fashion. The correct patient, procedure, and site was verified.   Injection Procedure Details:   Procedure diagnoses: Lumbar radiculopathy [M54.16]    Meds Administered:  Meds ordered this encounter  Medications   methylPREDNISolone acetate (DEPO-MEDROL) injection 80 mg    Laterality: Bilateral  Location/Site: L5  Needle:5.0 in., 22 ga.  Short bevel or Quincke spinal needle  Needle Placement: Transforaminal  Findings:    -Comments: Excellent flow of contrast along the nerve, nerve root and into the epidural space.  Procedure Details: After squaring off the end-plates to get a true AP view, the C-arm was positioned so that an oblique view of the foramen as noted above was visualized. The target area is just inferior to the "nose of the scotty dog" or sub pedicular. The soft tissues overlying this structure were infiltrated with 2-3 ml. of 1% Lidocaine without Epinephrine.  The spinal needle was inserted toward the target using a "trajectory" view along the fluoroscope beam.  Under AP and lateral visualization, the needle was advanced so it did not puncture dura and was located close the 6 O'Clock position of the pedical in AP tracterory. Biplanar projections were used to confirm position. Aspiration was confirmed to be negative for CSF and/or blood. A 1-2 ml. volume of Isovue-250 was injected and flow of contrast was  noted at each level. Radiographs were obtained for documentation purposes.   After attaining the desired flow of contrast documented above, a 0.5 to 1.0 ml test dose of 0.25% Marcaine was injected into each respective transforaminal space.  The patient was observed for 90 seconds post injection.  After no sensory deficits  were reported, and normal lower extremity motor function was noted,   the above injectate was administered so that equal amounts of the injectate were placed at each foramen (level) into the transforaminal epidural space.   Additional Comments:  No complications occurred Dressing: 2 x 2 sterile gauze and Band-Aid    Post-procedure details: Patient was observed during the procedure. Post-procedure instructions were reviewed.  Patient left the clinic in stable condition.    Clinical History: MRI LUMBAR SPINE WITHOUT CONTRAST   TECHNIQUE: Multiplanar, multisequence MR imaging of the lumbar spine was performed. No intravenous contrast was administered.   COMPARISON:  Radiographs December 02, 2021.   FINDINGS: Segmentation:  Standard.   Alignment: Dextroconvex scoliosis. Small anterolisthesis at L3-4 and L4-5. Small retrolisthesis at L1-2, L2-3 and L5-S1.   Vertebrae: No fracture, evidence of discitis, or bone lesion. Endplate degenerative changes with loss of disc height and Schmorl nodes throughout the lumbar spine with associated marrow edema at L3-4 and L4-5.   Conus medullaris and cauda equina: Conus extends to the L1 level. Conus and cauda equina appear normal.   Paraspinal and other soft tissues: Negative.   Disc levels:   T12-L1: Disc bulge and moderate facet degenerative changes without significant spinal canal or neural foraminal stenosis.   L1-2: Disc bulge, moderate facet degenerative changes and ligamentum flavum redundancy resulting in mild spinal canal stenosis with narrowing of the bilateral subarticular zones and mild bilateral foraminal.   L2-3: Disc bulge, moderate hypertrophic facet degenerative changes and ligamentum flavum redundancy resulting in mild spinal canal stenosis, mild right and moderate left neural foraminal narrowing.   L3-4: Disc bulge, prominent hypertrophic facet degenerative changes with bilateral joint effusion and ligamentum  flavum redundancy resulting in severe spinal canal stenosis, mild right and severe left neural foraminal.   L4-5: Disc bulge, prominent hypertrophic facet degenerative changes with bilateral joint effusion and ligamentum flavum redundancy resulting in severe spinal canal stenosis, mild right and moderate left neural foraminal narrowing.   L5-S1: Disc bulge, prominent hypertrophic facet degenerative changes with bilateral joint effusion and ligamentum flavum redundancy resulting in severe spinal canal stenosis and mild-to-moderate bilateral foraminal narrowing.   IMPRESSION: 1. 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 2. Multilevel neural foraminal narrowing severe on the left at L3-4 and moderate on the left at L2-3 and L4-5.     Electronically Signed   By: Pedro Earls M.D.   On: 01/21/2022 16:03     Objective:  VS:  HT:    WT:   BMI:     BP:121/78  HR:83bpm  TEMP: ( )  RESP:  Physical Exam Vitals and nursing note reviewed.  Constitutional:      General: She is not in acute distress.    Appearance: Normal appearance. She is not ill-appearing.  HENT:     Head: Normocephalic and atraumatic.     Right Ear: External ear normal.     Left Ear: External ear normal.  Eyes:     Extraocular Movements: Extraocular movements intact.  Cardiovascular:     Rate and Rhythm: Normal rate.     Pulses: Normal pulses.  Pulmonary:     Effort: Pulmonary effort is normal. No respiratory distress.  Abdominal:     General: There is no distension.     Palpations: Abdomen is soft.  Musculoskeletal:        General: Tenderness present.     Cervical back: Neck supple.     Right lower leg: No edema.     Left lower leg: No edema.     Comments: Patient has good distal strength with no pain over the greater trochanters.  No clonus or focal weakness.  Skin:    Findings: No erythema, lesion or rash.  Neurological:      General: No focal deficit present.     Mental Status: She is alert and oriented to person, place, and time.     Sensory: No sensory deficit.     Motor: No weakness or abnormal muscle tone.     Coordination: Coordination normal.  Psychiatric:        Mood and Affect: Mood normal.        Behavior: Behavior normal.      Imaging: No results found.

## 2022-10-13 NOTE — Procedures (Signed)
Lumbosacral Transforaminal Epidural Steroid Injection - Sub-Pedicular Approach with Fluoroscopic Guidance  Patient: Cassidy Garcia      Date of Birth: [birth year] MRN: [id number] PCP: Burnard Bunting, MD      Visit Date: 10/07/2022   Universal Protocol:    Date/Time: 10/07/2022  Consent Given By: the patient  Position: PRONE  Additional Comments: Vital signs were monitored before and after the procedure. Patient was prepped and draped in the usual sterile fashion. The correct patient, procedure, and site was verified.   Injection Procedure Details:   Procedure diagnoses: Lumbar radiculopathy [M54.16]    Meds Administered:  Meds ordered this encounter  Medications   methylPREDNISolone acetate (DEPO-MEDROL) injection 80 mg    Laterality: Bilateral  Location/Site: L5  Needle:5.0 in., 22 ga.  Short bevel or Quincke spinal needle  Needle Placement: Transforaminal  Findings:    -Comments: Excellent flow of contrast along the nerve, nerve root and into the epidural space.  Procedure Details: After squaring off the end-plates to get a true AP view, the C-arm was positioned so that an oblique view of the foramen as noted above was visualized. The target area is just inferior to the "nose of the scotty dog" or sub pedicular. The soft tissues overlying this structure were infiltrated with 2-3 ml. of 1% Lidocaine without Epinephrine.  The spinal needle was inserted toward the target using a "trajectory" view along the fluoroscope beam.  Under AP and lateral visualization, the needle was advanced so it did not puncture dura and was located close the 6 O'Clock position of the pedical in AP tracterory. Biplanar projections were used to confirm position. Aspiration was confirmed to be negative for CSF and/or blood. A 1-2 ml. volume of Isovue-250 was injected and flow of contrast was noted at each level. Radiographs were obtained for documentation purposes.   After attaining the desired  flow of contrast documented above, a 0.5 to 1.0 ml test dose of 0.25% Marcaine was injected into each respective transforaminal space.  The patient was observed for 90 seconds post injection.  After no sensory deficits were reported, and normal lower extremity motor function was noted,   the above injectate was administered so that equal amounts of the injectate were placed at each foramen (level) into the transforaminal epidural space.   Additional Comments:  No complications occurred Dressing: 2 x 2 sterile gauze and Band-Aid    Post-procedure details: Patient was observed during the procedure. Post-procedure instructions were reviewed.  Patient left the clinic in stable condition.

## 2022-10-14 ENCOUNTER — Encounter: Payer: Self-pay | Admitting: Rehabilitative and Restorative Service Providers"

## 2022-10-14 ENCOUNTER — Ambulatory Visit (INDEPENDENT_AMBULATORY_CARE_PROVIDER_SITE_OTHER): Payer: Medicare Other | Admitting: Rehabilitative and Restorative Service Providers"

## 2022-10-14 DIAGNOSIS — M6281 Muscle weakness (generalized): Secondary | ICD-10-CM

## 2022-10-14 DIAGNOSIS — M25511 Pain in right shoulder: Secondary | ICD-10-CM | POA: Diagnosis not present

## 2022-10-14 DIAGNOSIS — G8929 Other chronic pain: Secondary | ICD-10-CM

## 2022-10-14 NOTE — Therapy (Signed)
OUTPATIENT PHYSICAL THERAPY TREATMENT NOTE   Patient Name: Cassidy Garcia MRN: [id number] DOB:1945/11/27, 77 y.o., female Today's Date: 10/14/2022    END OF SESSION:   PT End of Session - 10/14/22 1344     Visit Number 15    Number of Visits 33    Date for PT Re-Evaluation 12/06/22    Authorization Type Medicare and BCBS Federal    Progress Note Due on Visit 24    PT Start Time 1345    PT Stop Time 1424    PT Time Calculation (min) 39 min    Activity Tolerance Patient tolerated treatment well    Behavior During Therapy WFL for tasks assessed/performed               Past Medical History:  Diagnosis Date   Arthritis    Asthma    Bronchitis 03/2016   Chronic kidney disease    stage 3   Colon polyps    tubular adenoma   Constipation    Depression    Diverticulosis    GERD (gastroesophageal reflux disease)    Hemorrhoids    Hyperlipidemia    Hypertension    Hypothyroidism    Shortness of breath    with exertion on occasion   Sinusitis    Skin cancer    Past Surgical History:  Procedure Laterality Date   ABDOMINAL HYSTERECTOMY     CARPOMETACARPAL ([place]) FUSION OF THUMB Bilateral    CATARACT EXTRACTION, BILATERAL     CHOLECYSTECTOMY     COLONOSCOPY  02/12/2011   HAMMER TOE SURGERY Right    LAPAROSCOPIC APPENDECTOMY N/A 08/26/2021   Procedure: APPENDECTOMY LAPAROSCOPIC WITH LYSIS OF ADHESIONS;  Surgeon: Anjela Cassara Boston, MD;  Location: [facility];  Service: General;  Laterality: N/A;   NASAL SINUS SURGERY     POLYPECTOMY     TOTAL HIP ARTHROPLASTY  12/10/2011   Procedure: TOTAL HIP ARTHROPLASTY ANTERIOR APPROACH;  Surgeon: Mcarthur Rossetti, MD;  Location: [facility];  Service: Orthopedics;  Laterality: Right;  Right Total Hip Arthroplasty, Anterior Approach   Patient Active Problem List   Diagnosis Date Noted   Rotator cuff tendonitis, right 07/02/2022   Nausea 12/02/2021   Bright red rectal bleeding 09/15/2021   History of colonic polyps 07/13/2021    Gastroenteritis 09/09/2020   Anemia 03/06/2020   Hypothyroidism 02/04/2020   Depression 01/24/2020   Asthma 01/24/2020   CKD (chronic kidney disease), stage III ([place]) 01/24/2020   Acute on chronic renal failure (HCC) 01/24/2020   Constipation 12/20/2018   Pain in right hip 06/29/2017   Trochanteric bursitis, right hip 06/29/2017   History of total replacement of right hip 06/29/2017   Hemorrhoids 03/01/2017   Great toe pain, left 12/04/2016   Chronic right shoulder pain 12/04/2016   Non-toxic multinodular goiter 05/14/2013   Degenerative arthritis of hip 12/10/2011   HTN (hypertension) 07/06/2011   Hyperlipidemia 07/06/2011   Gastro-esophageal reflux disease without esophagitis 09/23/2009     THERAPY DIAG:  Chronic right shoulder pain  Muscle weakness (generalized)   PCP: Burnard Bunting MD  REFERRING PROVIDER: Persons, Bevely [place], [place]  REFERRING DIAG: [phone] (ICD-10-CM) - Chronic right shoulder pain  EVAL THERAPY DIAG:  Chronic right shoulder pain  Muscle weakness (generalized)  Rationale for Evaluation and Treatment: Rehabilitation  ONSET DATE: Sept 2023  SUBJECTIVE:  SUBJECTIVE STATEMENT: She indicated  4/10 at worst in last week or two. She indicated doing things with arm with push/pull of things around the house.  She indicated having times without as much pain medicine with good results overall. Keeping up with range of motion activity.   Reported 75 % overall improvement.   PERTINENT HISTORY: CKD, history of chronic back pain.  History of physical therapy for back/hip.   PAIN:  NPRS scale: at worst in last week or two 4/10 at worst Pain location: Rt shoulder   Pain description: reaching movements but at rest too at night  Aggravating factors: pull/push, reach Relieving  factors: Voltaren gel, patches, tylenol  PRECAUTIONS: None  WEIGHT BEARING RESTRICTIONS: No  FALLS:  Has patient fallen in last 6 months? No  LIVING ENVIRONMENT: Lives in: House/apartment Stairs: has stairs at home  OCCUPATION: No work  PLOF: Independent, Rt hand dominant, read, puzzles, household activity, no yard work.   PATIENT GOALS:Reduce pain, being able to lift   OBJECTIVE:   PATIENT SURVEYS:  08/26/2022: foto update:  54  07/14/2022 FOTO intake:  54   predicted:  62  COGNITION: 07/14/2022 Overall cognitive status: WFL     SENSATION: 07/14/2022 WFL  POSTURE: 07/14/2022 Mild scapular protraction/FHP  UPPER EXTREMITY ROM:   07/14/2022:  Against gravity end range pain Rt shoulder in flexion/abduction, WFL.No limits in supine and WFL in all directions.    ROM Right 07/14/2022 Left 07/14/2022 Right 08/17/2022(post new pain onset) Right 08/19/2022 Right  08/26/2022 Right 09/27/2022  Shoulder flexion 145 (sitting)  80 AROM against gravity 95 AROM in supine 160 AROM in supine 165 AROM sitting WF in sitting against gravity (catch in eccentric lowering)  Shoulder extension        Shoulder abduction 120 (sitting)    150 AROM sitting   Shoulder adduction        Shoulder internal rotation        Shoulder external rotation        (Blank rows = not tested)  UPPER EXTREMITY MMT:  MMT Right 07/14/2022 Left 07/14/2022 Right 08/04/22 Right 08/26/2022 Right 09/27/2022 Right 10/14/2022  Shoulder flexion 4/5 c pain 5/5 4/5 with pain 5/5 4+/5 c pain 5/5  Shoulder extension        Shoulder abduction 4/5 c pain 5/5 4/5  pain 5/5 4+/5 c pain 5/5  Shoulder adduction        Shoulder internal rotation 5/5 5/5  5/5 5/5 5/5  Shoulder external rotation 4/5 c pain 5/5 4/5  pain 4/5 c pain 3+/5 c pain 4/5  Middle trapezius        Lower trapezius        (Blank rows = not tested)  SHOULDER SPECIAL TESTS: 08/17/2022:  negative popeye's Rt.  Pain noted c biceps loading testing,  Mild shrug sign noted.   07/14/2022 (-) Rt empty can, painful arc  JOINT MOBILITY TESTING:  07/14/2022 No restriction  PALPATION:  08/23/2022:  Strong and numerous trigger points in Rt infraspinatus, supraspinatus  07/14/2022 Trigger point  TODAY'S TREATMENT:  DATE: 10/14/2022 Therex: Standing blue band rows bilateral 2 x 15 Standing blue gh ext bilateral 2 x 15  Standing green band ER c towel under arm at side 2 x 10 bilateral Standing green band IR c towel under arm at side 2 x 10 bilateral Seated blue band biceps curl 2 x 15 Rt arm Seated scap retraction 2-3 sec hold x 15 Cross arm stretch , performed bilateraly 15 sec x 5  TODAY'S TREATMENT:                                                                                                         DATE: 09/27/2022 Therex: Review of existing HEP c adjustments based off symptom response today.    Standing green band rows  2x 15 Standing green band GH ext 2 x 15 Seated ER isometric 5 sec hold x 3 (cues to avoid pain response.  Seated green band biceps curls 2 x 10 Rt Supine active Rt shoulder flexion x 10   TODAY'S TREATMENT:                                                                                                         DATE:09/06/2022 Therex: Supine stabilization against mild clinician resistance in supine 90 deg flexion 15 sec bouts x 6 Standing green band rows bilateral x 20 Standing green band GH ext x 20  Standing c ER isometric reactive against green band c towel under arm 5 sec hold x10, ER AROM x 5 (stopped due to difficulty)   Standing eccentric only lowering flexion Rt shoulder 90 - 0 deg 1 lb x 3, x 7 0 lbs   TODAY'S TREATMENT:                                                                                                         DATE:09/02/2022 Therex: Supine Rt shoulder flexion 2x10; 1# Supine circles in  90 deg Rt shoulder flexion 1 lb x 30 each way (cw, ccw) Sidelying Rt shoulder abduction 2x10; 1# Sidelying Rt shoulder ER 2x10; 1#  Standing short lever arm Rt shoulder flexion 1# x 10 reps; then performed in elevated supine position x 10 reps Rows L3 band  2x10 Wall ladder flexion on Rt x 5 reps   PATIENT EDUCATION: 08/17/2022 Education details: HEP update Person educated: Patient Education method: Consulting civil engineer, Demonstration, Verbal cues, and Handouts Education comprehension: verbalized understanding, returned demonstration, and verbal cues required  HOME EXERCISE PROGRAM: Access Code: [email or web address] URL: [email or web address] Date: 09/27/2022 Prepared by: Scot Jun  Exercises - Seated Scapular Retraction  - 3-5 x daily - 7 x weekly - 1 sets - 10 reps - 3-5 hold - Standing Shoulder Posterior Capsule Stretch (Mirrored)  - 2 x daily - 7 x weekly - 1 sets - 3-5 reps - 15 hold - Seated 55 Gallon Barrel Hug Stretch  - 2 x daily - 7 x weekly - 1 sets - 10 reps - 5 hold - Supine Shoulder Flexion Extension AAROM with Dowel  - 2-3 x daily - 7 x weekly - 1-2 sets - 10-15 reps - 3 hold - Supine Scapular Protraction in Flexion with Dumbbells  - 1-2 x daily - 7 x weekly - 1 sets - 10 reps - 3 hold - Standing Isometric Shoulder Internal Rotation at Doorway  - 1-2 x daily - 7 x weekly - 1 sets - 15 reps - 5 hold - Standing Isometric Shoulder External Rotation with Doorway (Mirrored)  - 2 x daily - 7 x weekly - 1 sets - 10 reps - 5 hold - Standing Isometric Shoulder Flexion with Doorway - Arm Bent  - 1-2 x daily - 7 x weekly - 1 sets - 10 reps - 5-10 hold - Standing Bilateral Low Shoulder Row with Anchored Resistance  - 1-2 x daily - 7 x weekly - 1-2 sets - 10-15 reps - Shoulder Extension with Resistance  - 1-2 x daily - 7 x weekly - 1-2 sets - 10-15 reps  ASSESSMENT:  CLINICAL IMPRESSION: Improved tolerance and performance toleration to intervention today compared to previous visit.   Reviewed strengthening plan for home use c good overall understanding.   OBJECTIVE IMPAIRMENTS: decreased activity tolerance, decreased coordination, decreased endurance, decreased strength, increased fascial restrictions, impaired perceived functional ability, improper body mechanics, and pain.   ACTIVITY LIMITATIONS: carrying, lifting, reach over head, and hygiene/grooming  PARTICIPATION LIMITATIONS: meal prep, cleaning, laundry, interpersonal relationship, and community activity  PERSONAL FACTORS: no specific factors are affecting patient's functional outcome.   REHAB POTENTIAL: Good  CLINICAL DECISION MAKING: Stable/uncomplicated  EVALUATION COMPLEXITY: Low   GOALS: Goals reviewed with patient? Yes  SHORT TERM GOALS: (target date for Short term goals are 3 weeks 08/03/2022)  1.Patient will demonstrate independent use of home exercise program to maintain progress from in clinic treatments. Goal status: partialy met  LONG TERM GOALS: (target dates for all long term goals are 10 weeks 12/06/2022 )   1. Patient will demonstrate/report pain at worst less than or equal to 2/10 to facilitate minimal limitation in daily activity secondary to pain symptoms. Goal status: revised 09/27/2022   2. Patient will demonstrate independent use of home exercise program to facilitate ability to maintain/progress functional gains from skilled physical therapy services. Goal status: revised 09/27/2022   3. Patient will demonstrate FOTO outcome > or = 62 % to indicate reduced disability due to condition. Goal status: revised 09/27/2022   4.  Patient will demonstrate Rt UE MMT 5/5 throughout to facilitate lifting, reaching, carrying at [facility] in daily activity.   Goal status: revised 09/27/2022   5.  Patient will demonstrate Rt GH joint AROM WFL s symptoms to facilitate usual overhead reaching, self care, dressing at PLOF.  Goal status: revised 09/27/2022   6.  Patient will demonstrate/report  ability to sleep s restriction. Goal status: revised 09/27/2022   PLAN:  PT FREQUENCY: 1-2x/week  PT DURATION: 10 weeks  PLANNED INTERVENTIONS: Therapeutic exercises, Therapeutic activity, Neuro Muscular re-education, Balance training, Gait training, Patient/Family education, Joint mobilization, Stair training, DME instructions, Dry Needling, Electrical stimulation, Traction, Cryotherapy, vasopneumatic device Moist heat, Taping, Ultrasound, Ionotophoresis '4mg'$ /ml Dexamethasone, and Manual therapy.  All included unless contraindicated  PLAN FOR NEXT SESSION:  Progress note for MD, all measurements/goal assessment.     Scot Jun, PT, DPT, OCS, ATC 10/14/22  2:22 PM

## 2022-10-20 ENCOUNTER — Ambulatory Visit (INDEPENDENT_AMBULATORY_CARE_PROVIDER_SITE_OTHER): Payer: Medicare Other | Admitting: Rehabilitative and Restorative Service Providers"

## 2022-10-20 ENCOUNTER — Encounter: Payer: Self-pay | Admitting: Rehabilitative and Restorative Service Providers"

## 2022-10-20 DIAGNOSIS — G8929 Other chronic pain: Secondary | ICD-10-CM

## 2022-10-20 DIAGNOSIS — M6281 Muscle weakness (generalized): Secondary | ICD-10-CM | POA: Diagnosis not present

## 2022-10-20 DIAGNOSIS — M25511 Pain in right shoulder: Secondary | ICD-10-CM | POA: Diagnosis not present

## 2022-10-20 NOTE — Therapy (Signed)
OUTPATIENT PHYSICAL THERAPY TREATMENT NOTE   Patient Name: Cassidy Garcia MRN: [id number] DOB:04-28-1946, 77 y.o., female Today's Date: 10/20/2022    END OF SESSION:   PT End of Session - 10/20/22 1342     Visit Number 16    Number of Visits 33    Date for PT Re-Evaluation 12/06/22    Authorization Type Medicare and [place]    Progress Note Due on Visit 24    PT Start Time 1342    PT Stop Time 1422    PT Time Calculation (min) 40 min    Activity Tolerance Patient tolerated treatment well    Behavior During Therapy WFL for tasks assessed/performed                Past Medical History:  Diagnosis Date   Arthritis    Asthma    Bronchitis 03/2016   Chronic kidney disease    stage 3   Colon polyps    tubular adenoma   Constipation    Depression    Diverticulosis    GERD (gastroesophageal reflux disease)    Hemorrhoids    Hyperlipidemia    Hypertension    Hypothyroidism    Shortness of breath    with exertion on occasion   Sinusitis    Skin cancer    Past Surgical History:  Procedure Laterality Date   ABDOMINAL HYSTERECTOMY     CARPOMETACARPAL ([place]) FUSION OF THUMB Bilateral    CATARACT EXTRACTION, BILATERAL     CHOLECYSTECTOMY     COLONOSCOPY  02/12/2011   HAMMER TOE SURGERY Right    LAPAROSCOPIC APPENDECTOMY N/A 08/26/2021   Procedure: APPENDECTOMY LAPAROSCOPIC WITH LYSIS OF ADHESIONS;  Surgeon: Jerimyah Vandunk [place], MD;  Location: [facility];  Service: General;  Laterality: N/A;   NASAL SINUS SURGERY     POLYPECTOMY     TOTAL HIP ARTHROPLASTY  12/10/2011   Procedure: TOTAL HIP ARTHROPLASTY ANTERIOR APPROACH;  Surgeon: Mcarthur Rossetti, MD;  Location: [facility];  Service: Orthopedics;  Laterality: Right;  Right Total Hip Arthroplasty, Anterior Approach   Patient Active Problem List   Diagnosis Date Noted   Rotator cuff tendonitis, right 07/02/2022   Nausea 12/02/2021   Bright red rectal bleeding 09/15/2021   History of colonic polyps 07/13/2021    Gastroenteritis 09/09/2020   Anemia 03/06/2020   Hypothyroidism 02/04/2020   Depression 01/24/2020   Asthma 01/24/2020   CKD (chronic kidney disease), stage III ([place]) 01/24/2020   Acute on chronic renal failure (HCC) 01/24/2020   Constipation 12/20/2018   Pain in right hip 06/29/2017   Trochanteric bursitis, right hip 06/29/2017   History of total replacement of right hip 06/29/2017   Hemorrhoids 03/01/2017   Great toe pain, left 12/04/2016   Chronic right shoulder pain 12/04/2016   Non-toxic multinodular goiter 05/14/2013   Degenerative arthritis of hip 12/10/2011   HTN (hypertension) 07/06/2011   Hyperlipidemia 07/06/2011   Gastro-esophageal reflux disease without esophagitis 09/23/2009     THERAPY DIAG:  Chronic right shoulder pain  Muscle weakness (generalized)   PCP: Burnard Bunting MD  REFERRING PROVIDER: Persons, Bevely Palmer, [place]  REFERRING DIAG: [phone] (ICD-10-CM) - Chronic right shoulder pain  EVAL THERAPY DIAG:  Chronic right shoulder pain  Muscle weakness (generalized)  Rationale for Evaluation and Treatment: Rehabilitation  ONSET DATE: Sept 2023  SUBJECTIVE:  SUBJECTIVE STATEMENT: She indicated two instances of trouble.  One was lifting water out of car (last Thursday) that resulted in increased pain in shoulder for a couple days.  She then mentioned a fall onto shoulder 2 nights ago onto the sofa resulted.  She reported the worsened symptoms were the same as chief complaints from past.  Rt shoulder/upper arm symtpoms.   PERTINENT HISTORY: CKD, history of chronic back pain.  History of physical therapy for back/hip.   PAIN:  NPRS scale: at worst for today: 4-5/10 Pain location: Rt shoulder   Pain description: reaching movements but at rest too at night  Aggravating  factors: lifting bottles, fall onto shoulder.  Relieving factors: Voltaren gel, patches, tylenol  PRECAUTIONS: None  WEIGHT BEARING RESTRICTIONS: No  FALLS:  Has patient fallen in last 6 months? No  LIVING ENVIRONMENT: Lives in: House/apartment Stairs: has stairs at home  OCCUPATION: No work  PLOF: Independent, Rt hand dominant, read, puzzles, household activity, no yard work.   PATIENT GOALS:Reduce pain, being able to lift   OBJECTIVE:   PATIENT SURVEYS:  08/26/2022: foto update:  54  07/14/2022 FOTO intake:  54   predicted:  62  COGNITION: 07/14/2022 Overall cognitive status: WFL     SENSATION: 07/14/2022 WFL  POSTURE: 07/14/2022 Mild scapular protraction/FHP  UPPER EXTREMITY ROM:   07/14/2022:  Against gravity end range pain Rt shoulder in flexion/abduction, WFL.No limits in supine and WFL in all directions.    ROM Right 07/14/2022 Left 07/14/2022 Right 08/17/2022(post new pain onset) Right 08/19/2022 Right  08/26/2022 Right 09/27/2022 Right 11/09/2022  Shoulder flexion 145 (sitting)  80 AROM against gravity 95 AROM in supine 160 AROM in supine 165 AROM sitting WFL in sitting against gravity (catch in eccentric lowering) WFL AROM against gravity c mild soreness  Shoulder extension         Shoulder abduction 120 (sitting)    150 AROM sitting    Shoulder adduction         Shoulder internal rotation         Shoulder external rotation         (Blank rows = not tested)  UPPER EXTREMITY MMT:  MMT Right 07/14/2022 Left 07/14/2022 Right 08/04/22 Right 08/26/2022 Right 09/27/2022 Right 10/14/2022  Shoulder flexion 4/5 c pain 5/5 4/5 with pain 5/5 4+/5 c pain 5/5  Shoulder extension        Shoulder abduction 4/5 c pain 5/5 4/5  pain 5/5 4+/5 c pain 5/5  Shoulder adduction        Shoulder internal rotation 5/5 5/5  5/5 5/5 5/5  Shoulder external rotation 4/5 c pain 5/5 4/5  pain 4/5 c pain 3+/5 c pain 4/5  Middle trapezius        Lower trapezius         (Blank rows = not tested)  SHOULDER SPECIAL TESTS: 08/17/2022:  negative popeye's Rt.  Pain noted c biceps loading testing, Mild shrug sign noted.   07/14/2022 (-) Rt empty can, painful arc  JOINT MOBILITY TESTING:  07/14/2022 No restriction  PALPATION:  08/23/2022:  Strong and numerous trigger points in Rt infraspinatus, supraspinatus  07/14/2022 Trigger point  TODAY'S TREATMENT:  DATE: 10/20/2022 Therex: Standing blue band rows bilateral x 20  Standing blue gh ext bilateral  x 20  Standing green band ER c towel under arm at side 2 x 10 bilateral c isometric hold to fatigue on last set Seated blue band biceps curl 2 x 10 c eccentric lowering only Rt arm Seated shoulder flexion to 90 deg to abduction to side and reverse 1 lb x 10 bilateral Cross arm stretch 15 sec x 3 Rt  UBE fwd/back 3 mins each   TODAY'S TREATMENT:                                                                                                         DATE: 10/14/2022 Therex: Standing blue band rows bilateral 2 x 15 Standing blue gh ext bilateral 2 x 15  Standing green band ER c towel under arm at side 2 x 10 bilateral Standing green band IR c towel under arm at side 2 x 10 bilateral Seated blue band biceps curl 2 x 15 Rt arm Seated scap retraction 2-3 sec hold x 15 Cross arm stretch , performed bilateraly 15 sec x 5  TODAY'S TREATMENT:                                                                                                         DATE: 09/27/2022 Therex: Review of existing HEP c adjustments based off symptom response today.    Standing green band rows  2x 15 Standing green band GH ext 2 x 15 Seated ER isometric 5 sec hold x 3 (cues to avoid pain response.  Seated green band biceps curls 2 x 10 Rt Supine active Rt shoulder flexion x 10   TODAY'S TREATMENT:                                                                                                          DATE:09/06/2022 Therex: Supine stabilization against mild clinician resistance in supine 90 deg flexion 15 sec bouts x 6 Standing green band rows bilateral x 20 Standing green band GH ext x 20  Standing c ER isometric reactive against green band c towel under arm 5 sec hold x10, ER AROM  x 5 (stopped due to difficulty)  Standing eccentric only lowering flexion Rt shoulder 90 - 0 deg 1 lb x 3, x 7 0 lbs   TODAY'S TREATMENT:                                                                                                         DATE:09/02/2022 Therex: Supine Rt shoulder flexion 2x10; 1# Supine circles in 90 deg Rt shoulder flexion 1 lb x 30 each way (cw, ccw) Sidelying Rt shoulder abduction 2x10; 1# Sidelying Rt shoulder ER 2x10; 1#  Standing short lever arm Rt shoulder flexion 1# x 10 reps; then performed in elevated supine position x 10 reps Rows L3 band 2x10 Wall ladder flexion on Rt x 5 reps   PATIENT EDUCATION: 08/17/2022 Education details: HEP update Person educated: Patient Education method: Consulting civil engineer, Demonstration, Verbal cues, and Handouts Education comprehension: verbalized understanding, returned demonstration, and verbal cues required  HOME EXERCISE PROGRAM: Access Code: [email or web address] URL: [email or web address] Date: 09/27/2022 Prepared by: Scot Jun  Exercises - Seated Scapular Retraction  - 3-5 x daily - 7 x weekly - 1 sets - 10 reps - 3-5 hold - Standing Shoulder Posterior Capsule Stretch (Mirrored)  - 2 x daily - 7 x weekly - 1 sets - 3-5 reps - 15 hold - Seated 55 Gallon Barrel Hug Stretch  - 2 x daily - 7 x weekly - 1 sets - 10 reps - 5 hold - Supine Shoulder Flexion Extension AAROM with Dowel  - 2-3 x daily - 7 x weekly - 1-2 sets - 10-15 reps - 3 hold - Supine Scapular Protraction in Flexion with Dumbbells  - 1-2 x daily - 7 x weekly - 1 sets - 10 reps - 3 hold - Standing Isometric  Shoulder Internal Rotation at Doorway  - 1-2 x daily - 7 x weekly - 1 sets - 15 reps - 5 hold - Standing Isometric Shoulder External Rotation with Doorway (Mirrored)  - 2 x daily - 7 x weekly - 1 sets - 10 reps - 5 hold - Standing Isometric Shoulder Flexion with Doorway - Arm Bent  - 1-2 x daily - 7 x weekly - 1 sets - 10 reps - 5-10 hold - Standing Bilateral Low Shoulder Row with Anchored Resistance  - 1-2 x daily - 7 x weekly - 1-2 sets - 10-15 reps - Shoulder Extension with Resistance  - 1-2 x daily - 7 x weekly - 1-2 sets - 10-15 reps  ASSESSMENT:  CLINICAL IMPRESSION: Pt returned to clinic today with two instances that increased Rt shoulder pain complaints (see subjective as described).  Presentation today showed good elevation movement against gravity still present and improved from previous.  Some increased difficulty and fatigue noted in activity today for strengthening.  Pt may continue to benefit from skilled PT services to progress towards goals.  Return to MD for follow up next week per Pt report.   OBJECTIVE IMPAIRMENTS: decreased activity tolerance, decreased coordination, decreased endurance, decreased strength, increased fascial restrictions, impaired perceived functional ability, improper body mechanics, and  pain.   ACTIVITY LIMITATIONS: carrying, lifting, reach over head, and hygiene/grooming  PARTICIPATION LIMITATIONS: meal prep, cleaning, laundry, interpersonal relationship, and community activity  PERSONAL FACTORS: no specific factors are affecting patient's functional outcome.   REHAB POTENTIAL: Good  CLINICAL DECISION MAKING: Stable/uncomplicated  EVALUATION COMPLEXITY: Low   GOALS: Goals reviewed with patient? Yes  SHORT TERM GOALS: (target date for Short term goals are 3 weeks 08/03/2022)  1.Patient will demonstrate independent use of home exercise program to maintain progress from in clinic treatments. Goal status: partialy met  LONG TERM GOALS: (target  dates for all long term goals are 10 weeks 12/06/2022 )   1. Patient will demonstrate/report pain at worst less than or equal to 2/10 to facilitate minimal limitation in daily activity secondary to pain symptoms. Goal status: revised 09/27/2022   2. Patient will demonstrate independent use of home exercise program to facilitate ability to maintain/progress functional gains from skilled physical therapy services. Goal status: revised 09/27/2022   3. Patient will demonstrate FOTO outcome > or = 62 % to indicate reduced disability due to condition. Goal status: revised 09/27/2022   4.  Patient will demonstrate Rt UE MMT 5/5 throughout to facilitate lifting, reaching, carrying at [facility] in daily activity.   Goal status: revised 09/27/2022   5.  Patient will demonstrate Rt GH joint AROM WFL s symptoms to facilitate usual overhead reaching, self care, dressing at PLOF.    Goal status: revised 09/27/2022   6.  Patient will demonstrate/report ability to sleep s restriction. Goal status: revised 09/27/2022   PLAN:  PT FREQUENCY: 1-2x/week  PT DURATION: 10 weeks  PLANNED INTERVENTIONS: Therapeutic exercises, Therapeutic activity, Neuro Muscular re-education, Balance training, Gait training, Patient/Family education, Joint mobilization, Stair training, DME instructions, Dry Needling, Electrical stimulation, Traction, Cryotherapy, vasopneumatic device Moist heat, Taping, Ultrasound, Ionotophoresis '4mg'$ /ml Dexamethasone, and Manual therapy.  All included unless contraindicated  PLAN FOR NEXT SESSION:  Recheck MD interaction.    Scot Jun, PT, DPT, OCS, ATC 10/20/22  2:23 PM

## 2022-10-21 ENCOUNTER — Encounter: Payer: Self-pay | Admitting: Radiology

## 2022-10-25 ENCOUNTER — Other Ambulatory Visit (HOSPITAL_COMMUNITY): Payer: Self-pay

## 2022-10-25 ENCOUNTER — Encounter: Payer: Self-pay | Admitting: Physician Assistant

## 2022-10-25 ENCOUNTER — Ambulatory Visit (INDEPENDENT_AMBULATORY_CARE_PROVIDER_SITE_OTHER): Payer: Medicare Other | Admitting: Physician Assistant

## 2022-10-25 DIAGNOSIS — M19011 Primary osteoarthritis, right shoulder: Secondary | ICD-10-CM | POA: Diagnosis not present

## 2022-10-25 MED ORDER — ACETAMINOPHEN-CODEINE 300-30 MG PO TABS
1.0000 | ORAL_TABLET | Freq: Three times a day (TID) | ORAL | 0 refills | Status: DC | PRN
  Filled 2022-10-25: qty 30, 10d supply, fill #0

## 2022-10-25 NOTE — Progress Notes (Signed)
HPI: Mrs. Sladek comes in today again for right shoulder pain.  She states the intra-articular injection right shoulder with Dr. Rolena Infante on 09/14/2022 really did not help.  She states she has 4 out of 10 pain most of the time in the shoulder.  She states therapy on her shoulders helps some.  She is asking for refill on her Tylenol 3.  She did have 1 fall onto a soft sofa since she was last seen but no other injury to the shoulder.  Radiographs reviewed from 07/02/2022 right shoulder shows moderate shoulder arthritis with cystic changes.  Review of systems see HPI otherwise negative  Physical exam: General no acute distress mood affect appropriate Right shoulder 5 out of 5 strength external and internal rotation against resistance empty can test is negative.  Impingement testing positive.  She has full overhead motion of the right shoulder but this causes her discomfort.  Impression: Right shoulder arthritis  Plan: Recommend she continue therapy as she is not interested in any type of surgical intervention.  Therefore would not perform any further diagnostic studies.  Did refill her Tylenol 3 but feels that this should be filled by her primary care physician this she sees him frequently.  She will follow-up with [place] as needed.  Questions encouraged and answered at length

## 2022-10-27 ENCOUNTER — Encounter: Payer: Self-pay | Admitting: Rehabilitative and Restorative Service Providers"

## 2022-10-27 ENCOUNTER — Ambulatory Visit (INDEPENDENT_AMBULATORY_CARE_PROVIDER_SITE_OTHER): Payer: Medicare Other | Admitting: Rehabilitative and Restorative Service Providers"

## 2022-10-27 DIAGNOSIS — M25511 Pain in right shoulder: Secondary | ICD-10-CM

## 2022-10-27 DIAGNOSIS — G8929 Other chronic pain: Secondary | ICD-10-CM | POA: Diagnosis not present

## 2022-10-27 DIAGNOSIS — M6281 Muscle weakness (generalized): Secondary | ICD-10-CM | POA: Diagnosis not present

## 2022-10-27 NOTE — Therapy (Signed)
OUTPATIENT PHYSICAL THERAPY TREATMENT NOTE   Patient Name: KATSUKO PRY MRN: [id number] DOB:04/30/1946, 77 y.o., female Today's Date: 10/27/2022    END OF SESSION:   PT End of Session - 10/27/22 1425     Visit Number 17    Number of Visits 33    Date for PT Re-Evaluation 12/06/22    Authorization Type Medicare and [place]    Progress Note Due on Visit 24    PT Start Time 1425    PT Stop Time 1504    PT Time Calculation (min) 39 min    Activity Tolerance Patient limited by pain    Behavior During Therapy [facility] for tasks assessed/performed              Past Medical History:  Diagnosis Date   Arthritis    Asthma    Bronchitis 03/2016   Chronic kidney disease    stage 3   Colon polyps    tubular adenoma   Constipation    Depression    Diverticulosis    GERD (gastroesophageal reflux disease)    Hemorrhoids    Hyperlipidemia    Hypertension    Hypothyroidism    Shortness of breath    with exertion on occasion   Sinusitis    Skin cancer    Past Surgical History:  Procedure Laterality Date   ABDOMINAL HYSTERECTOMY     CARPOMETACARPAL ([place]) FUSION OF THUMB Bilateral    CATARACT EXTRACTION, BILATERAL     CHOLECYSTECTOMY     COLONOSCOPY  02/12/2011   HAMMER TOE SURGERY Right    LAPAROSCOPIC APPENDECTOMY N/A 08/26/2021   Procedure: APPENDECTOMY LAPAROSCOPIC WITH LYSIS OF ADHESIONS;  Surgeon: Grizel Vesely Boston, MD;  Location: [facility];  Service: General;  Laterality: N/A;   NASAL SINUS SURGERY     POLYPECTOMY     TOTAL HIP ARTHROPLASTY  12/10/2011   Procedure: TOTAL HIP ARTHROPLASTY ANTERIOR APPROACH;  Surgeon: Mcarthur Rossetti, MD;  Location: [facility];  Service: Orthopedics;  Laterality: Right;  Right Total Hip Arthroplasty, Anterior Approach   Patient Active Problem List   Diagnosis Date Noted   Rotator cuff tendonitis, right 07/02/2022   Nausea 12/02/2021   Bright red rectal bleeding 09/15/2021   History of colonic polyps 07/13/2021    Gastroenteritis 09/09/2020   Anemia 03/06/2020   Hypothyroidism 02/04/2020   Depression 01/24/2020   Asthma 01/24/2020   CKD (chronic kidney disease), stage III ([place]) 01/24/2020   Acute on chronic renal failure (HCC) 01/24/2020   Constipation 12/20/2018   Pain in right hip 06/29/2017   Trochanteric bursitis, right hip 06/29/2017   History of total replacement of right hip 06/29/2017   Hemorrhoids 03/01/2017   Great toe pain, left 12/04/2016   Chronic right shoulder pain 12/04/2016   Non-toxic multinodular goiter 05/14/2013   Degenerative arthritis of hip 12/10/2011   HTN (hypertension) 07/06/2011   Hyperlipidemia 07/06/2011   Gastro-esophageal reflux disease without esophagitis 09/23/2009     THERAPY DIAG:  Chronic right shoulder pain  Muscle weakness (generalized)   PCP: Burnard Bunting MD  REFERRING PROVIDER: Persons, Bevely Palmer, [place]  REFERRING DIAG: [id number] (ICD-10-CM) - Chronic right shoulder pain  EVAL THERAPY DIAG:  Chronic right shoulder pain  Muscle weakness (generalized)  Rationale for Evaluation and Treatment: Rehabilitation  ONSET DATE: Sept 2023  SUBJECTIVE:  SUBJECTIVE STATEMENT: She indicated waking up last night/this morning due to pain.  She mentioned it seemed to build over last few days.  She indicated 4-5/10 upon arrival.  Can feel more with reaching.  Put patch on arm to see if it helped.   PERTINENT HISTORY: CKD, history of chronic back pain.  History of physical therapy for back/hip.   PAIN:  NPRS scale: at worst for today: 4-5/10 Pain location: Rt shoulder   Pain description: reaching movements but at rest too at night  Aggravating factors: lifting bottles, fall onto shoulder.  Relieving factors: Voltaren gel, patches, tylenol  PRECAUTIONS:  None  WEIGHT BEARING RESTRICTIONS: No  FALLS:  Has patient fallen in last 6 months? No  LIVING ENVIRONMENT: Lives in: House/apartment Stairs: has stairs at home  OCCUPATION: No work  PLOF: Independent, Rt hand dominant, read, puzzles, household activity, no yard work.   PATIENT GOALS:Reduce pain, being able to lift   OBJECTIVE:   PATIENT SURVEYS:  08/26/2022: foto update:  54  07/14/2022 FOTO intake:  54   predicted:  62  COGNITION: 07/14/2022 Overall cognitive status: WFL     SENSATION: 07/14/2022 WFL  POSTURE: 07/14/2022 Mild scapular protraction/FHP  UPPER EXTREMITY ROM:   07/14/2022:  Against gravity end range pain Rt shoulder in flexion/abduction, WFL.No limits in supine and WFL in all directions.    ROM Right 07/14/2022 Left 07/14/2022 Right 08/17/2022(post new pain onset) Right 08/19/2022 Right  08/26/2022 Right 09/27/2022 Right 11/09/2022  Shoulder flexion 145 (sitting)  80 AROM against gravity 95 AROM in supine 160 AROM in supine 165 AROM sitting WFL in sitting against gravity (catch in eccentric lowering) WFL AROM against gravity c mild soreness  Shoulder extension         Shoulder abduction 120 (sitting)    150 AROM sitting    Shoulder adduction         Shoulder internal rotation         Shoulder external rotation         (Blank rows = not tested)  UPPER EXTREMITY MMT:  MMT Right 07/14/2022 Left 07/14/2022 Right 08/04/22 Right 08/26/2022 Right 09/27/2022 Right 10/14/2022  Shoulder flexion 4/5 c pain 5/5 4/5 with pain 5/5 4+/5 c pain 5/5  Shoulder extension        Shoulder abduction 4/5 c pain 5/5 4/5  pain 5/5 4+/5 c pain 5/5  Shoulder adduction        Shoulder internal rotation 5/5 5/5  5/5 5/5 5/5  Shoulder external rotation 4/5 c pain 5/5 4/5  pain 4/5 c pain 3+/5 c pain 4/5  Middle trapezius        Lower trapezius        (Blank rows = not tested)  SHOULDER SPECIAL TESTS: 08/17/2022:  negative popeye's Rt.  Pain noted c biceps loading  testing, Mild shrug sign noted.   07/14/2022 (-) Rt empty can, painful arc  JOINT MOBILITY TESTING:  07/14/2022 No restriction  PALPATION:  08/23/2022:  Strong and numerous trigger points in Rt infraspinatus, supraspinatus  07/14/2022 Trigger point  TODAY'S TREATMENT:  DATE: 10/27/2022 Manual:   Supine Rt shoulder g2 inferior joint mobs in flexion, scaption and abduction for pain relief aspects  Therex: Stabilizations in 90 deg flexion in supine Rt shoulder c mild resistances all directions 20 seconds x 3 Supine horizontal abduction green band 2 x 10  Sidelying Rt shoulder ER c towel under arm 2 x 10 1 lb Sidelying Rt shoulder abduction x3 attempts(unable due to pain) Supine Rt shoulder flexion to fatigue 1 lb  Cross arm stretch 15 sec x 3 Rt   TODAY'S TREATMENT:                                                                                                         DATE: 10/20/2022 Therex: Standing blue band rows bilateral x 20  Standing blue gh ext bilateral  x 20  Standing green band ER c towel under arm at side 2 x 10 bilateral c isometric hold to fatigue on last set Seated blue band biceps curl 2 x 10 c eccentric lowering only Rt arm Seated shoulder flexion to 90 deg to abduction to side and reverse 1 lb x 10 bilateral Cross arm stretch 15 sec x 3 Rt  UBE fwd/back 3 mins each   TODAY'S TREATMENT:                                                                                                         DATE: 10/14/2022 Therex: Standing blue band rows bilateral 2 x 15 Standing blue gh ext bilateral 2 x 15  Standing green band ER c towel under arm at side 2 x 10 bilateral Standing green band IR c towel under arm at side 2 x 10 bilateral Seated blue band biceps curl 2 x 15 Rt arm Seated scap retraction 2-3 sec hold x 15 Cross arm stretch , performed bilateraly 15 sec x  5  TODAY'S TREATMENT:                                                                                                         DATE: 09/27/2022 Therex: Review of existing HEP c adjustments based off symptom response today.    Standing green band rows  2x 15  Standing green band GH ext 2 x 15 Seated ER isometric 5 sec hold x 3 (cues to avoid pain response.  Seated green band biceps curls 2 x 10 Rt Supine active Rt shoulder flexion x 10   TODAY'S TREATMENT:                                                                                                         DATE:09/06/2022 Therex: Supine stabilization against mild clinician resistance in supine 90 deg flexion 15 sec bouts x 6 Standing green band rows bilateral x 20 Standing green band GH ext x 20  Standing c ER isometric reactive against green band c towel under arm 5 sec hold x10, ER AROM x 5 (stopped due to difficulty)  Standing eccentric only lowering flexion Rt shoulder 90 - 0 deg 1 lb x 3, x 7 0 lbs   PATIENT EDUCATION: 08/17/2022 Education details: HEP update Person educated: Patient Education method: Consulting civil engineer, Demonstration, Verbal cues, and Handouts Education comprehension: verbalized understanding, returned demonstration, and verbal cues required  HOME EXERCISE PROGRAM: Access Code: [email or web address] URL: [email or web address] Date: 09/27/2022 Prepared by: Scot Jun  Exercises - Seated Scapular Retraction  - 3-5 x daily - 7 x weekly - 1 sets - 10 reps - 3-5 hold - Standing Shoulder Posterior Capsule Stretch (Mirrored)  - 2 x daily - 7 x weekly - 1 sets - 3-5 reps - 15 hold - Seated 55 Gallon Barrel Hug Stretch  - 2 x daily - 7 x weekly - 1 sets - 10 reps - 5 hold - Supine Shoulder Flexion Extension AAROM with Dowel  - 2-3 x daily - 7 x weekly - 1-2 sets - 10-15 reps - 3 hold - Supine Scapular Protraction in Flexion with Dumbbells  - 1-2 x daily - 7 x weekly - 1 sets - 10 reps - 3 hold - Standing Isometric  Shoulder Internal Rotation at Doorway  - 1-2 x daily - 7 x weekly - 1 sets - 15 reps - 5 hold - Standing Isometric Shoulder External Rotation with Doorway (Mirrored)  - 2 x daily - 7 x weekly - 1 sets - 10 reps - 5 hold - Standing Isometric Shoulder Flexion with Doorway - Arm Bent  - 1-2 x daily - 7 x weekly - 1 sets - 10 reps - 5-10 hold - Standing Bilateral Low Shoulder Row with Anchored Resistance  - 1-2 x daily - 7 x weekly - 1-2 sets - 10-15 reps - Shoulder Extension with Resistance  - 1-2 x daily - 7 x weekly - 1-2 sets - 10-15 reps  ASSESSMENT:  CLINICAL IMPRESSION: Joint mobility passive accessory movement and overall passive movement WFL without complaints.  Muscle performance still remains chief evident impairment and symptom related task at this time.  Per MD note review, no thoughts of imaging for any surgical intervention possibility(ie rotator cuff involvement) per Pt desire not to pursue surgery options.    OBJECTIVE IMPAIRMENTS: decreased activity tolerance, decreased coordination, decreased endurance, decreased strength, increased fascial restrictions, impaired perceived functional ability, improper body  mechanics, and pain.   ACTIVITY LIMITATIONS: carrying, lifting, reach over head, and hygiene/grooming  PARTICIPATION LIMITATIONS: meal prep, cleaning, laundry, interpersonal relationship, and community activity  PERSONAL FACTORS: no specific factors are affecting patient's functional outcome.   REHAB POTENTIAL: Good  CLINICAL DECISION MAKING: Stable/uncomplicated  EVALUATION COMPLEXITY: Low   GOALS: Goals reviewed with patient? Yes  SHORT TERM GOALS: (target date for Short term goals are 3 weeks 08/03/2022)  1.Patient will demonstrate independent use of home exercise program to maintain progress from in clinic treatments. Goal status: partialy met  LONG TERM GOALS: (target dates for all long term goals are 10 weeks 12/06/2022 )   1. Patient will demonstrate/report  pain at worst less than or equal to 2/10 to facilitate minimal limitation in daily activity secondary to pain symptoms. Goal status: on going 10/27/2022   2. Patient will demonstrate independent use of home exercise program to facilitate ability to maintain/progress functional gains from skilled physical therapy services. Goal status: ron going 10/27/2022   3. Patient will demonstrate FOTO outcome > or = 62 % to indicate reduced disability due to condition. Goal status: ron going 10/27/2022   4.  Patient will demonstrate Rt UE MMT 5/5 throughout to facilitate lifting, reaching, carrying at [facility] in daily activity.   Goal status: on going 10/27/2022   5.  Patient will demonstrate Rt GH joint AROM WFL s symptoms to facilitate usual overhead reaching, self care, dressing at PLOF.    Goal status: on going 10/27/2022   6.  Patient will demonstrate/report ability to sleep s restriction. Goal status: on going 10/27/2022   PLAN:  PT FREQUENCY: 1-2x/week  PT DURATION: 10 weeks  PLANNED INTERVENTIONS: Therapeutic exercises, Therapeutic activity, Neuro Muscular re-education, Balance training, Gait training, Patient/Family education, Joint mobilization, Stair training, DME instructions, Dry Needling, Electrical stimulation, Traction, Cryotherapy, vasopneumatic device Moist heat, Taping, Ultrasound, Ionotophoresis '4mg'$ /ml Dexamethasone, and Manual therapy.  All included unless contraindicated  PLAN FOR NEXT SESSION:  Strengthening improvements as tolerated.    Scot Jun, PT, DPT, OCS, ATC 10/27/22  3:03 PM

## 2022-11-03 ENCOUNTER — Ambulatory Visit (INDEPENDENT_AMBULATORY_CARE_PROVIDER_SITE_OTHER): Payer: Medicare Other | Admitting: Rehabilitative and Restorative Service Providers"

## 2022-11-03 ENCOUNTER — Encounter: Payer: Self-pay | Admitting: Rehabilitative and Restorative Service Providers"

## 2022-11-03 DIAGNOSIS — M25511 Pain in right shoulder: Secondary | ICD-10-CM | POA: Diagnosis not present

## 2022-11-03 DIAGNOSIS — M6281 Muscle weakness (generalized): Secondary | ICD-10-CM | POA: Diagnosis not present

## 2022-11-03 DIAGNOSIS — G8929 Other chronic pain: Secondary | ICD-10-CM

## 2022-11-03 NOTE — Therapy (Addendum)
OUTPATIENT PHYSICAL THERAPY TREATMENT NOTE /DISCHARGE   Patient Name: Cassidy Garcia MRN: [id number] DOB:09-Jun-1946, 77 y.o., female Today's Date: 11/03/2022    END OF SESSION:   PT End of Session - 11/03/22 1432     Visit Number 18    Number of Visits 33    Date for PT Re-Evaluation 12/06/22    Authorization Type Medicare and BCBS Federal  - KX    Progress Note Due on Visit 24    PT Start Time 1427    PT Stop Time 1506    PT Time Calculation (min) 39 min    Activity Tolerance Patient limited by pain    Behavior During Therapy WFL for tasks assessed/performed               Past Medical History:  Diagnosis Date   Arthritis    Asthma    Bronchitis 03/2016   Chronic kidney disease    stage 3   Colon polyps    tubular adenoma   Constipation    Depression    Diverticulosis    GERD (gastroesophageal reflux disease)    Hemorrhoids    Hyperlipidemia    Hypertension    Hypothyroidism    Shortness of breath    with exertion on occasion   Sinusitis    Skin cancer    Past Surgical History:  Procedure Laterality Date   ABDOMINAL HYSTERECTOMY     CARPOMETACARPAL (CMC) FUSION OF THUMB Bilateral    CATARACT EXTRACTION, BILATERAL     CHOLECYSTECTOMY     COLONOSCOPY  02/12/2011   HAMMER TOE SURGERY Right    LAPAROSCOPIC APPENDECTOMY N/A 08/26/2021   Procedure: APPENDECTOMY LAPAROSCOPIC WITH LYSIS OF ADHESIONS;  Surgeon: Karie Soda, MD;  Location: [facility];  Service: General;  Laterality: N/A;   NASAL SINUS SURGERY     POLYPECTOMY     TOTAL HIP ARTHROPLASTY  12/10/2011   Procedure: TOTAL HIP ARTHROPLASTY ANTERIOR APPROACH;  Surgeon: Kathryne Hitch, MD;  Location: [facility];  Service: Orthopedics;  Laterality: Right;  Right Total Hip Arthroplasty, Anterior Approach   Patient Active Problem List   Diagnosis Date Noted   Rotator cuff tendonitis, right 07/02/2022   Nausea 12/02/2021   Bright red rectal bleeding 09/15/2021   History of colonic polyps 07/13/2021    Gastroenteritis 09/09/2020   Anemia 03/06/2020   Hypothyroidism 02/04/2020   Depression 01/24/2020   Asthma 01/24/2020   CKD (chronic kidney disease), stage III (HCC) 01/24/2020   Acute on chronic renal failure (HCC) 01/24/2020   Constipation 12/20/2018   Pain in right hip 06/29/2017   Trochanteric bursitis, right hip 06/29/2017   History of total replacement of right hip 06/29/2017   Hemorrhoids 03/01/2017   Great toe pain, left 12/04/2016   Chronic right shoulder pain 12/04/2016   Non-toxic multinodular goiter 05/14/2013   Degenerative arthritis of hip 12/10/2011   HTN (hypertension) 07/06/2011   Hyperlipidemia 07/06/2011   Gastro-esophageal reflux disease without esophagitis 09/23/2009     THERAPY DIAG:  Chronic right shoulder pain  Muscle weakness (generalized)   PCP: Geoffry Paradise MD  REFERRING PROVIDER: Persons, [place], [place]  REFERRING DIAG: [phone] (ICD-10-CM) - Chronic right shoulder pain  EVAL THERAPY DIAG:  Chronic right shoulder pain  Muscle weakness (generalized)  Rationale for Evaluation and Treatment: Rehabilitation  ONSET DATE: Sept 2023  SUBJECTIVE:  SUBJECTIVE STATEMENT: She indicated light days on activity feel pretty good.  Heavy day activity creates some complaint increase.  Reported overall similar to last week.   PERTINENT HISTORY: CKD, history of chronic back pain.  History of physical therapy for back/hip.   PAIN:  NPRS scale: at worst for today:4/10 Pain location: Rt shoulder   Pain description: reaching movements but at rest too at night  Aggravating factors: lifting bottles, fall onto shoulder.  Relieving factors: Voltaren gel, patches, tylenol  PRECAUTIONS: None  WEIGHT BEARING RESTRICTIONS: No  FALLS:  Has patient fallen in last 6 months?  No  LIVING ENVIRONMENT: Lives in: House/apartment Stairs: has stairs at home  OCCUPATION: No work  PLOF: Independent, Rt hand dominant, read, puzzles, household activity, no yard work.   PATIENT GOALS:Reduce pain, being able to lift   OBJECTIVE:   PATIENT SURVEYS:  11/03/2022:  FOTO update:  53  08/26/2022: foto update:  54  07/14/2022 FOTO intake:  54   predicted:  62  COGNITION: 07/14/2022 Overall cognitive status: WFL     SENSATION: 07/14/2022 WFL  POSTURE: 07/14/2022 Mild scapular protraction/FHP  UPPER EXTREMITY ROM:   07/14/2022:  Against gravity end range pain Rt shoulder in flexion/abduction, WFL.No limits in supine and WFL in all directions.    ROM Right 07/14/2022 Left 07/14/2022 Right 08/17/2022(post new pain onset) Right 08/19/2022 Right  08/26/2022 Right 09/27/2022 Right 11/09/2022  Shoulder flexion 145 (sitting)  80 AROM against gravity 95 AROM in supine 160 AROM in supine 165 AROM sitting WFL in sitting against gravity (catch in eccentric lowering) WFL AROM against gravity c mild soreness  Shoulder extension         Shoulder abduction 120 (sitting)    150 AROM sitting    Shoulder adduction         Shoulder internal rotation         Shoulder external rotation         (Blank rows = not tested)  UPPER EXTREMITY MMT:  MMT Right 07/14/2022 Left 07/14/2022 Right 08/04/22 Right 08/26/2022 Right 09/27/2022 Right 10/14/2022 Right 11/03/2022  Shoulder flexion 4/5 c pain 5/5 4/5 with pain 5/5 4+/5 c pain 5/5 4+/5  Shoulder extension         Shoulder abduction 4/5 c pain 5/5 4/5  pain 5/5 4+/5 c pain 5/5 4+/5  Shoulder adduction         Shoulder internal rotation 5/5 5/5  5/5 5/5 5/5 5/5  Shoulder external rotation 4/5 c pain 5/5 4/5  pain 4/5 c pain 3+/5 c pain 4/5 4/5  Middle trapezius         Lower trapezius         (Blank rows = not tested)  SHOULDER SPECIAL TESTS: 08/17/2022:  negative popeye's Rt.  Pain noted c biceps loading testing, Mild  shrug sign noted.   07/14/2022 (-) Rt empty can, painful arc  JOINT MOBILITY TESTING:  07/14/2022 No restriction  PALPATION:  08/23/2022:  Strong and numerous trigger points in Rt infraspinatus, supraspinatus  07/14/2022 Trigger point  TODAY'S TREATMENT:  DATE: 11/03/2022 Therex: UBE fwd 6 mins UE only, 5 mins backwards lvl 2.0 Supine Rt shoulder flexion 4 mins to fatigue Sidelying Rt shoulder abduction x 15  Sidelying Rt shoulder ER c towel under arm 2 x 10  Seated Rt shoulder flexion x 10   Verbal review of existing HEP c cues for techniques and handout provided.   TODAY'S TREATMENT:                                                                                                         DATE: 10/27/2022 Manual:   Supine Rt shoulder g2 inferior joint mobs in flexion, scaption and abduction for pain relief aspects  Therex: Stabilizations in 90 deg flexion in supine Rt shoulder c mild resistances all directions 20 seconds x 3 Supine horizontal abduction green band 2 x 10  Sidelying Rt shoulder ER c towel under arm 2 x 10 1 lb Sidelying Rt shoulder abduction x3 attempts(unable due to pain) Supine Rt shoulder flexion to fatigue 1 lb  Cross arm stretch 15 sec x 3 Rt   TODAY'S TREATMENT:                                                                                                         DATE: 10/20/2022 Therex: Standing blue band rows bilateral x 20  Standing blue gh ext bilateral  x 20  Standing green band ER c towel under arm at side 2 x 10 bilateral c isometric hold to fatigue on last set Seated blue band biceps curl 2 x 10 c eccentric lowering only Rt arm Seated shoulder flexion to 90 deg to abduction to side and reverse 1 lb x 10 bilateral Cross arm stretch 15 sec x 3 Rt  UBE fwd/back 3 mins each   TODAY'S TREATMENT:                                                                                                          DATE: 10/14/2022 Therex: Standing blue band rows bilateral 2 x 15 Standing blue gh ext bilateral 2 x 15  Standing green band ER c towel under arm at side 2 x 10 bilateral Standing green band IR  c towel under arm at side 2 x 10 bilateral Seated blue band biceps curl 2 x 15 Rt arm Seated scap retraction 2-3 sec hold x 15 Cross arm stretch , performed bilateraly 15 sec x 5   PATIENT EDUCATION: 11/03/2022 Education details: HEP update Person educated: Patient Education method: Consulting civil engineer, Demonstration, Verbal cues, and Handouts Education comprehension: verbalized understanding, returned demonstration, and verbal cues required  HOME EXERCISE PROGRAM: Access Code: [email or web address] URL: [email or web address] Date: 11/03/2022 Prepared by: Scot Jun  Exercises - Seated Scapular Retraction  - 3-5 x daily - 7 x weekly - 1 sets - 10 reps - 3-5 hold - Standing Shoulder Posterior Capsule Stretch (Mirrored)  - 2 x daily - 7 x weekly - 1 sets - 3-5 reps - 15 hold - Supine Shoulder Flexion Extension Full Range AROM  - 1-2 x daily - 7 x weekly - 2-3 sets - 10-15 reps - Supine Scapular Protraction in Flexion with Dumbbells  - 1-2 x daily - 7 x weekly - 1 sets - 10 reps - 3 hold - Standing Isometric Shoulder External Rotation with Doorway (Mirrored)  - 2 x daily - 7 x weekly - 1 sets - 10 reps - 5 hold - Standing Bilateral Low Shoulder Row with Anchored Resistance  - 1-2 x daily - 7 x weekly - 1-2 sets - 10-15 reps - Shoulder Extension with Resistance  - 1-2 x daily - 7 x weekly - 1-2 sets - 10-15 reps - Sidelying Shoulder External Rotation (Mirrored)  - 1-2 x daily - 7 x weekly - 2-3 sets - 10-15 reps - Sidelying Shoulder Abduction Palm Forward  - 1-2 x daily - 7 x weekly - 2-3 sets - 10-15 reps - Standing Shoulder Flexion to 90 Degrees  - 1 x daily - 7 x weekly - 3 sets - 10 reps  ASSESSMENT:  CLINICAL IMPRESSION: After discussion with  patient, decision was made to review HEP and prepare for trial HEP period at this time.  Continued complaints of Rt shoulder pain c weakness as noted but times of reduced severity and improved function as been noted.  A few incidence of movements that worsen symptoms with period of recovery to return to previous improvement level.  Trial HEP at this time.   OBJECTIVE IMPAIRMENTS: decreased activity tolerance, decreased coordination, decreased endurance, decreased strength, increased fascial restrictions, impaired perceived functional ability, improper body mechanics, and pain.   ACTIVITY LIMITATIONS: carrying, lifting, reach over head, and hygiene/grooming  PARTICIPATION LIMITATIONS: meal prep, cleaning, laundry, interpersonal relationship, and community activity  PERSONAL FACTORS: no specific factors are affecting patient's functional outcome.   REHAB POTENTIAL: Good  CLINICAL DECISION MAKING: Stable/uncomplicated  EVALUATION COMPLEXITY: Low   GOALS: Goals reviewed with patient? Yes  SHORT TERM GOALS: (target date for Short term goals are 3 weeks 08/03/2022)  1.Patient will demonstrate independent use of home exercise program to maintain progress from in clinic treatments. Goal status: partialy met  LONG TERM GOALS: (target dates for all long term goals are 10 weeks 12/06/2022 )   1. Patient will demonstrate/report pain at worst less than or equal to 2/10 to facilitate minimal limitation in daily activity secondary to pain symptoms. Goal status: on going 11/03/2022   2. Patient will demonstrate independent use of home exercise program to facilitate ability to maintain/progress functional gains from skilled physical therapy services. Goal status: on going 11/03/2022   3. Patient will demonstrate FOTO outcome > or = 62 % to indicate reduced disability due to condition.  Goal status: on going 11/03/2022   4.  Patient will demonstrate Rt UE MMT 5/5 throughout to facilitate lifting,  reaching, carrying at [facility] in daily activity.   Goal status: on going 11/03/2022   5.  Patient will demonstrate Rt GH joint AROM WFL s symptoms to facilitate usual overhead reaching, self care, dressing at PLOF.    Goal status: on going 11/03/2022   6.  Patient will demonstrate/report ability to sleep s restriction. Goal status: on going 11/03/2022   PLAN:  PT FREQUENCY: 1-2x/week  PT DURATION: 10 weeks  PLANNED INTERVENTIONS: Therapeutic exercises, Therapeutic activity, Neuro Muscular re-education, Balance training, Gait training, Patient/Family education, Joint mobilization, Stair training, DME instructions, Dry Needling, Electrical stimulation, Traction, Cryotherapy, vasopneumatic device Moist heat, Taping, Ultrasound, Ionotophoresis /ml Dexamethasone, and Manual therapy.  All included unless contraindicated  PLAN FOR NEXT SESSION:  Strengthening improvements as tolerated.    Chyrel Masson, PT, DPT, OCS, ATC 11/03/22  3:14 PM   PHYSICAL THERAPY DISCHARGE SUMMARY  Visits from Start of Care: 18  Current functional level related to goals / functional outcomes: See note   Remaining deficits: See note   Education / Equipment: HEP  Patient goals were partially met. Patient is being discharged due to not returning since the last visit.   Chyrel Masson, PT, DPT, OCS, ATC 12/09/22  9:45 AM

## 2022-11-25 DIAGNOSIS — Z961 Presence of intraocular lens: Secondary | ICD-10-CM | POA: Diagnosis not present

## 2022-11-25 DIAGNOSIS — H26491 Other secondary cataract, right eye: Secondary | ICD-10-CM | POA: Diagnosis not present

## 2022-11-25 DIAGNOSIS — H04123 Dry eye syndrome of bilateral lacrimal glands: Secondary | ICD-10-CM | POA: Diagnosis not present

## 2022-11-25 DIAGNOSIS — H52203 Unspecified astigmatism, bilateral: Secondary | ICD-10-CM | POA: Diagnosis not present

## 2022-12-06 DIAGNOSIS — N1832 Chronic kidney disease, stage 3b: Secondary | ICD-10-CM | POA: Diagnosis not present

## 2022-12-07 DIAGNOSIS — N1832 Chronic kidney disease, stage 3b: Secondary | ICD-10-CM | POA: Diagnosis not present

## 2022-12-08 DIAGNOSIS — L820 Inflamed seborrheic keratosis: Secondary | ICD-10-CM | POA: Diagnosis not present

## 2022-12-08 DIAGNOSIS — Z85828 Personal history of other malignant neoplasm of skin: Secondary | ICD-10-CM | POA: Diagnosis not present

## 2022-12-08 DIAGNOSIS — L821 Other seborrheic keratosis: Secondary | ICD-10-CM | POA: Diagnosis not present

## 2022-12-09 DIAGNOSIS — I129 Hypertensive chronic kidney disease with stage 1 through stage 4 chronic kidney disease, or unspecified chronic kidney disease: Secondary | ICD-10-CM | POA: Diagnosis not present

## 2022-12-09 DIAGNOSIS — N2581 Secondary hyperparathyroidism of renal origin: Secondary | ICD-10-CM | POA: Diagnosis not present

## 2022-12-09 DIAGNOSIS — N1832 Chronic kidney disease, stage 3b: Secondary | ICD-10-CM | POA: Diagnosis not present

## 2022-12-09 DIAGNOSIS — D631 Anemia in chronic kidney disease: Secondary | ICD-10-CM | POA: Diagnosis not present

## 2023-01-20 ENCOUNTER — Other Ambulatory Visit: Payer: Self-pay | Admitting: Physical Medicine and Rehabilitation

## 2023-01-20 ENCOUNTER — Ambulatory Visit (INDEPENDENT_AMBULATORY_CARE_PROVIDER_SITE_OTHER): Payer: Medicare Other | Admitting: Orthopedic Surgery

## 2023-01-20 ENCOUNTER — Encounter: Payer: Self-pay | Admitting: Orthopedic Surgery

## 2023-01-20 ENCOUNTER — Telehealth: Payer: Self-pay | Admitting: Physical Medicine and Rehabilitation

## 2023-01-20 DIAGNOSIS — G8929 Other chronic pain: Secondary | ICD-10-CM | POA: Diagnosis not present

## 2023-01-20 DIAGNOSIS — M48062 Spinal stenosis, lumbar region with neurogenic claudication: Secondary | ICD-10-CM

## 2023-01-20 DIAGNOSIS — M5416 Radiculopathy, lumbar region: Secondary | ICD-10-CM

## 2023-01-20 DIAGNOSIS — M25511 Pain in right shoulder: Secondary | ICD-10-CM | POA: Diagnosis not present

## 2023-01-20 NOTE — Telephone Encounter (Signed)
Patient came into clinic and requested injection. Last injection was 10/07/22 and lasted about 4 months with 75% relief. No new falls, accidents or injuries

## 2023-01-20 NOTE — Telephone Encounter (Signed)
Pt called requesting another ESI. Please call pt at [phone].Pt called Tuesday and left a voicemail.

## 2023-01-20 NOTE — Progress Notes (Signed)
Office Visit Note   Patient: Cassidy Garcia           Date of Birth: 02-May-1946           MRN: [id number] Visit Date: 01/20/2023 Requested by: Geoffry Paradise, MD [address] [place],  [place] [address] PCP: Geoffry Paradise, MD  Subjective: Chief Complaint  Patient presents with   Right Shoulder - Pain    HPI: Cassidy Garcia is a 77 y.o. female who presents to the office reporting right shoulder pain.  She has had pain for 5 years.  2 years ago the pain started getting worse in the right shoulder.  She is right-hand dominant.  The pain wakes her from sleep at night.  Describes decreased range of motion as well as radicular pain.  Denies much in the way of neck pain.  She does report some grinding.  The pain comes and goes.  The pain is increased after activity.  Lying down actually makes the pain worse.  Sitting in the chair makes it better.  Hard for her to reach up and out to the right.  Actually hurts her to read a book when she is laying down.  She has had extra strength Tylenol or Tylenol 3 with some relief.  Outside radiographs are reviewed and she does have some cystic changes in the greater tuberosity..                ROS: All systems reviewed are negative as they relate to the chief complaint within the history of present illness.  Patient denies fevers or chills.  Assessment & Plan: Visit Diagnoses:  1. Chronic right shoulder pain     Plan: Impression is right shoulder pain with possible rotator cuff pathology.  She does not have that much glenohumeral joint arthritis on plain radiographs and there is no diminishing acromiohumeral interval.  MRI arthrogram of the right shoulder is indicated because she would consider intervention at this time.  She has had an injection by Dr. Shon Baton in January which did not help her.  MRI arthrogram of the shoulder is indicated to evaluate the biceps tendon and possible small rotator cuff tear.  Follow-Up Instructions: No follow-ups on file.    Orders:  Orders Placed This Encounter  Procedures   MR SHOULDER RIGHT W CONTRAST   Arthrogram   No orders of the defined types were placed in this encounter.     Procedures: No procedures performed   Clinical Data: No additional findings.  Objective: Vital Signs: There were no vitals taken for this visit.  Physical Exam:  Constitutional: Patient appears well-developed HEENT:  Head: Normocephalic Eyes:EOM are normal Neck: Normal range of motion Cardiovascular: Normal rate Pulmonary/chest: Effort normal Neurologic: Patient is alert Skin: Skin is warm Psychiatric: Patient has normal mood and affect  Ortho Exam: Ortho exam demonstrates range of motion bilaterally in the shoulders of 60/95/175.  She does have a little bit of coarseness with internal and external rotation at 90 degrees of abduction.  Positive grind testing on the right negative on the left.  Positive speeds testing on the right negative on the left.  No discrete AC joint tenderness on the right-hand side.  Neck range of motion is full.  She has slight weakness to abduction on the right compared to the left but good strength to subscap testing and infraspinatus testing.  Specialty Comments:  MRI LUMBAR SPINE WITHOUT CONTRAST   TECHNIQUE: Multiplanar, multisequence MR imaging of the lumbar spine was performed.  No intravenous contrast was administered.   COMPARISON:  Radiographs December 02, 2021.   FINDINGS: Segmentation:  Standard.   Alignment: Dextroconvex scoliosis. Small anterolisthesis at L3-4 and L4-5. Small retrolisthesis at L1-2, L2-3 and L5-S1.   Vertebrae: No fracture, evidence of discitis, or bone lesion. Endplate degenerative changes with loss of disc height and Schmorl nodes throughout the lumbar spine with associated marrow edema at L3-4 and L4-5.   Conus medullaris and cauda equina: Conus extends to the L1 level. Conus and cauda equina appear normal.   Paraspinal and other soft  tissues: Negative.   Disc levels:   T12-L1: Disc bulge and moderate facet degenerative changes without significant spinal canal or neural foraminal stenosis.   L1-2: Disc bulge, moderate facet degenerative changes and ligamentum flavum redundancy resulting in mild spinal canal stenosis with narrowing of the bilateral subarticular zones and mild bilateral foraminal.   L2-3: Disc bulge, moderate hypertrophic facet degenerative changes and ligamentum flavum redundancy resulting in mild spinal canal stenosis, mild right and moderate left neural foraminal narrowing.   L3-4: Disc bulge, prominent hypertrophic facet degenerative changes with bilateral joint effusion and ligamentum flavum redundancy resulting in severe spinal canal stenosis, mild right and severe left neural foraminal.   L4-5: Disc bulge, prominent hypertrophic facet degenerative changes with bilateral joint effusion and ligamentum flavum redundancy resulting in severe spinal canal stenosis, mild right and moderate left neural foraminal narrowing.   L5-S1: Disc bulge, prominent hypertrophic facet degenerative changes with bilateral joint effusion and ligamentum flavum redundancy resulting in severe spinal canal stenosis and mild-to-moderate bilateral foraminal narrowing.   IMPRESSION: 1. 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 2. Multilevel neural foraminal narrowing severe on the left at L3-4 and moderate on the left at L2-3 and L4-5.     Electronically Signed   By: Baldemar Lenis M.D.   On: 01/21/2022 16:03  Imaging: No results found.   PMFS History: Patient Active Problem List   Diagnosis Date Noted   Rotator cuff tendonitis, right 07/02/2022   Nausea 12/02/2021   Bright red rectal bleeding 09/15/2021   History of colonic polyps 07/13/2021   Gastroenteritis 09/09/2020   Anemia 03/06/2020   Hypothyroidism 02/04/2020    Depression 01/24/2020   Asthma 01/24/2020   CKD (chronic kidney disease), stage III (HCC) 01/24/2020   Acute on chronic renal failure (HCC) 01/24/2020   Constipation 12/20/2018   Pain in right hip 06/29/2017   Trochanteric bursitis, right hip 06/29/2017   History of total replacement of right hip 06/29/2017   Hemorrhoids 03/01/2017   Great toe pain, left 12/04/2016   Chronic right shoulder pain 12/04/2016   Non-toxic multinodular goiter 05/14/2013   Degenerative arthritis of hip 12/10/2011   HTN (hypertension) 07/06/2011   Hyperlipidemia 07/06/2011   Gastro-esophageal reflux disease without esophagitis 09/23/2009   Past Medical History:  Diagnosis Date   Arthritis    Asthma    Bronchitis 03/2016   Chronic kidney disease    stage 3   Colon polyps    tubular adenoma   Constipation    Depression    Diverticulosis    GERD (gastroesophageal reflux disease)    Hemorrhoids    Hyperlipidemia    Hypertension    Hypothyroidism    Shortness of breath    with exertion on occasion   Sinusitis    Skin cancer     Family History  Problem Relation Age of Onset   Ovarian cancer  Mother    Anxiety disorder Mother    Heart disease Paternal Grandmother    Other Paternal Aunt        brain tumor   Birth defects Sister        no iris   Depression Sister    Anxiety disorder Sister    Colon cancer Neg Hx     Past Surgical History:  Procedure Laterality Date   ABDOMINAL HYSTERECTOMY     CARPOMETACARPAL (CMC) FUSION OF THUMB Bilateral    CATARACT EXTRACTION, BILATERAL     CHOLECYSTECTOMY     COLONOSCOPY  02/12/2011   HAMMER TOE SURGERY Right    LAPAROSCOPIC APPENDECTOMY N/A 08/26/2021   Procedure: APPENDECTOMY LAPAROSCOPIC WITH LYSIS OF ADHESIONS;  Surgeon: Karie Soda, MD;  Location: [facility];  Service: General;  Laterality: N/A;   NASAL SINUS SURGERY     POLYPECTOMY     TOTAL HIP ARTHROPLASTY  12/10/2011   Procedure: TOTAL HIP ARTHROPLASTY ANTERIOR APPROACH;  Surgeon: Kathryne Hitch, MD;  Location: [facility];  Service: Orthopedics;  Laterality: Right;  Right Total Hip Arthroplasty, Anterior Approach   Social History   Occupational History   Occupation: retired    Associate Professor: RETIRED  Tobacco Use   Smoking status: Former    Types: Cigarettes    Quit date: 08/16/2001    Years since quitting: 21.4   Smokeless tobacco: Never  Vaping Use   Vaping Use: Never used  Substance and Sexual Activity   Alcohol use: Not Currently    Comment: rare   Drug use: No   Sexual activity: Not on file

## 2023-01-26 ENCOUNTER — Ambulatory Visit: Payer: Medicare Other | Admitting: Orthopaedic Surgery

## 2023-01-31 DIAGNOSIS — R2681 Unsteadiness on feet: Secondary | ICD-10-CM | POA: Diagnosis not present

## 2023-01-31 DIAGNOSIS — I129 Hypertensive chronic kidney disease with stage 1 through stage 4 chronic kidney disease, or unspecified chronic kidney disease: Secondary | ICD-10-CM | POA: Diagnosis not present

## 2023-01-31 DIAGNOSIS — N1831 Chronic kidney disease, stage 3a: Secondary | ICD-10-CM | POA: Diagnosis not present

## 2023-02-01 ENCOUNTER — Encounter: Payer: Medicare Other | Admitting: Physical Medicine and Rehabilitation

## 2023-02-09 ENCOUNTER — Ambulatory Visit: Payer: Medicare Other | Admitting: Orthopedic Surgery

## 2023-02-11 ENCOUNTER — Ambulatory Visit
Admission: RE | Admit: 2023-02-11 | Discharge: 2023-02-11 | Disposition: A | Discharge status: Home / Self Care | Payer: Medicare Other | Source: Ambulatory Visit | Attending: Orthopedic Surgery | Admitting: Orthopedic Surgery

## 2023-02-11 DIAGNOSIS — G8929 Other chronic pain: Secondary | ICD-10-CM

## 2023-02-11 DIAGNOSIS — M25511 Pain in right shoulder: Secondary | ICD-10-CM | POA: Diagnosis not present

## 2023-02-11 DIAGNOSIS — M75121 Complete rotator cuff tear or rupture of right shoulder, not specified as traumatic: Secondary | ICD-10-CM | POA: Diagnosis not present

## 2023-02-11 MED ORDER — IOPAMIDOL (ISOVUE-M 200) INJECTION 41%
15.0000 mL | Freq: Once | INTRAMUSCULAR | Status: AC
  Administered 2023-02-11: 15 mL via INTRA_ARTICULAR

## 2023-02-14 ENCOUNTER — Encounter: Payer: Self-pay | Admitting: Physical Medicine and Rehabilitation

## 2023-02-16 ENCOUNTER — Ambulatory Visit (INDEPENDENT_AMBULATORY_CARE_PROVIDER_SITE_OTHER): Payer: Medicare Other | Admitting: Orthopedic Surgery

## 2023-02-16 DIAGNOSIS — M25511 Pain in right shoulder: Secondary | ICD-10-CM

## 2023-02-16 DIAGNOSIS — M19011 Primary osteoarthritis, right shoulder: Secondary | ICD-10-CM

## 2023-02-16 DIAGNOSIS — G8929 Other chronic pain: Secondary | ICD-10-CM | POA: Diagnosis not present

## 2023-02-17 ENCOUNTER — Encounter: Payer: Self-pay | Admitting: Orthopedic Surgery

## 2023-02-17 NOTE — Progress Notes (Signed)
Office Visit Note   Patient: Cassidy Garcia           Date of Birth: Jan 01, 1946           MRN: [id number] Visit Date: 02/16/2023 Requested by: Geoffry Paradise, MD [address] [place],  [place] [address] PCP: Geoffry Paradise, MD  Subjective: Chief Complaint  Patient presents with   Right Shoulder - Follow-up   Follow-up    HPI: Cassidy Garcia is a 77 y.o. female who presents to the office reporting continued right shoulder pain.  Since she was last seen she had an MRI scan.  Patient states she really cannot forward flex or AB duct that right arm beyond 90 degrees.  Pain is a bigger problem than weakness.  The patient states that her shoulder pain wakes her from night about every 2 hours on most nights.  Urged her to lay on her back.  Has had an intra-articular injection which did not give her sustained relief.  She is getting epidural steroid injections in her back as well.  Otherwise she is reasonably healthy.  Does live alone but has sisters who could come stay with her..                ROS: All systems reviewed are negative as they relate to the chief complaint within the history of present illness.  Patient denies fevers or chills.  Assessment & Plan: Visit Diagnoses:  1. Chronic right shoulder pain   2. Primary osteoarthritis, right shoulder     Plan: Impression is right shoulder rotator cuff arthropathy.  MRI scan shows fatty atrophy of all the shoulder musculature with large full-thickness retracted rotator cuff tears of the supraspinatus infraspinatus and subscapularis.  Biceps tendon remains intact.  Plan is thin cut CT scan for preoperative planning for right shoulder reverse shoulder replacement.  The risk and benefits are discussed with the patient include not limited to infection or vessel damage instability as well as potential for further surgery.  Expected rehab time also discussed.  Patient understands the risk and benefits and wishes to proceed.  Will likely schedule this  for September when she has had vacation at the end of August.  All questions answered  Follow-Up Instructions: No follow-ups on file.   Orders:  Orders Placed This Encounter  Procedures   CT SHOULDER RIGHT WO CONTRAST   No orders of the defined types were placed in this encounter.     Procedures: No procedures performed   Clinical Data: No additional findings.  Objective: Vital Signs: There were no vitals taken for this visit.  Physical Exam:  Constitutional: Patient appears well-developed HEENT:  Head: Normocephalic Eyes:EOM are normal Neck: Normal range of motion Cardiovascular: Normal rate Pulmonary/chest: Effort normal Neurologic: Patient is alert Skin: Skin is warm Psychiatric: Patient has normal mood and affect  Ortho Exam: Ortho exam demonstrates active forward flexion and abduction to about 70 degrees.  Does have diminished rotator cuff strength infraspinatus and subscap testing on the right compared to the left.  Passively her shoulder motion is about 75/110/170.  Deltoid is functional.  Specialty Comments:  MRI LUMBAR SPINE WITHOUT CONTRAST   TECHNIQUE: Multiplanar, multisequence MR imaging of the lumbar spine was performed. No intravenous contrast was administered.   COMPARISON:  Radiographs December 02, 2021.   FINDINGS: Segmentation:  Standard.   Alignment: Dextroconvex scoliosis. Small anterolisthesis at L3-4 and L4-5. Small retrolisthesis at L1-2, L2-3 and L5-S1.   Vertebrae: No fracture, evidence of discitis, or  bone lesion. Endplate degenerative changes with loss of disc height and Schmorl nodes throughout the lumbar spine with associated marrow edema at L3-4 and L4-5.   Conus medullaris and cauda equina: Conus extends to the L1 level. Conus and cauda equina appear normal.   Paraspinal and other soft tissues: Negative.   Disc levels:   T12-L1: Disc bulge and moderate facet degenerative changes without significant spinal canal or neural  foraminal stenosis.   L1-2: Disc bulge, moderate facet degenerative changes and ligamentum flavum redundancy resulting in mild spinal canal stenosis with narrowing of the bilateral subarticular zones and mild bilateral foraminal.   L2-3: Disc bulge, moderate hypertrophic facet degenerative changes and ligamentum flavum redundancy resulting in mild spinal canal stenosis, mild right and moderate left neural foraminal narrowing.   L3-4: Disc bulge, prominent hypertrophic facet degenerative changes with bilateral joint effusion and ligamentum flavum redundancy resulting in severe spinal canal stenosis, mild right and severe left neural foraminal.   L4-5: Disc bulge, prominent hypertrophic facet degenerative changes with bilateral joint effusion and ligamentum flavum redundancy resulting in severe spinal canal stenosis, mild right and moderate left neural foraminal narrowing.   L5-S1: Disc bulge, prominent hypertrophic facet degenerative changes with bilateral joint effusion and ligamentum flavum redundancy resulting in severe spinal canal stenosis and mild-to-moderate bilateral foraminal narrowing.   IMPRESSION: 1. 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 2. Multilevel neural foraminal narrowing severe on the left at L3-4 and moderate on the left at L2-3 and L4-5.     Electronically Signed   By: Baldemar Lenis M.D.   On: 01/21/2022 16:03  Imaging: No results found.   PMFS History: Patient Active Problem List   Diagnosis Date Noted   Rotator cuff tendonitis, right 07/02/2022   Nausea 12/02/2021   Bright red rectal bleeding 09/15/2021   History of colonic polyps 07/13/2021   Gastroenteritis 09/09/2020   Anemia 03/06/2020   Hypothyroidism 02/04/2020   Depression 01/24/2020   Asthma 01/24/2020   CKD (chronic kidney disease), stage III (HCC) 01/24/2020   Acute on chronic renal failure (HCC)  01/24/2020   Constipation 12/20/2018   Pain in right hip 06/29/2017   Trochanteric bursitis, right hip 06/29/2017   History of total replacement of right hip 06/29/2017   Hemorrhoids 03/01/2017   Great toe pain, left 12/04/2016   Chronic right shoulder pain 12/04/2016   Non-toxic multinodular goiter 05/14/2013   Degenerative arthritis of hip 12/10/2011   HTN (hypertension) 07/06/2011   Hyperlipidemia 07/06/2011   Gastro-esophageal reflux disease without esophagitis 09/23/2009   Past Medical History:  Diagnosis Date   Arthritis    Asthma    Bronchitis 03/2016   Chronic kidney disease    stage 3   Colon polyps    tubular adenoma   Constipation    Depression    Diverticulosis    GERD (gastroesophageal reflux disease)    Hemorrhoids    Hyperlipidemia    Hypertension    Hypothyroidism    Shortness of breath    with exertion on occasion   Sinusitis    Skin cancer     Family History  Problem Relation Age of Onset   Ovarian cancer Mother    Anxiety disorder Mother    Heart disease Paternal Grandmother    Other Paternal Aunt        brain tumor   Birth defects Sister        no iris   Depression  Sister    Anxiety disorder Sister    Colon cancer Neg Hx     Past Surgical History:  Procedure Laterality Date   ABDOMINAL HYSTERECTOMY     CARPOMETACARPAL (CMC) FUSION OF THUMB Bilateral    CATARACT EXTRACTION, BILATERAL     CHOLECYSTECTOMY     COLONOSCOPY  02/12/2011   HAMMER TOE SURGERY Right    LAPAROSCOPIC APPENDECTOMY N/A 08/26/2021   Procedure: APPENDECTOMY LAPAROSCOPIC WITH LYSIS OF ADHESIONS;  Surgeon: Karie Soda, MD;  Location: [facility];  Service: General;  Laterality: N/A;   NASAL SINUS SURGERY     POLYPECTOMY     TOTAL HIP ARTHROPLASTY  12/10/2011   Procedure: TOTAL HIP ARTHROPLASTY ANTERIOR APPROACH;  Surgeon: Kathryne Hitch, MD;  Location: [facility];  Service: Orthopedics;  Laterality: Right;  Right Total Hip Arthroplasty, Anterior Approach   Social  History   Occupational History   Occupation: retired    Associate Professor: RETIRED  Tobacco Use   Smoking status: Former    Types: Cigarettes    Quit date: 08/16/2001    Years since quitting: 21.5   Smokeless tobacco: Never  Vaping Use   Vaping Use: Never used  Substance and Sexual Activity   Alcohol use: Not Currently    Comment: rare   Drug use: No   Sexual activity: Not on file

## 2023-02-21 ENCOUNTER — Telehealth: Payer: Self-pay

## 2023-02-21 NOTE — Telephone Encounter (Signed)
-----   Message from Cammy Copa, MD sent at 02/17/2023  6:54 AM EDT ----- Cresenciano Lick can you send me a reminder to post this patient for surgery in September.  Thanks

## 2023-02-21 NOTE — Telephone Encounter (Signed)
Done thx

## 2023-02-21 NOTE — Telephone Encounter (Signed)
Surgery sheet needed so patient can be posted for surgery in September.

## 2023-03-01 ENCOUNTER — Other Ambulatory Visit: Payer: Self-pay

## 2023-03-01 ENCOUNTER — Encounter: Payer: Self-pay | Admitting: Physical Medicine and Rehabilitation

## 2023-03-01 ENCOUNTER — Ambulatory Visit (INDEPENDENT_AMBULATORY_CARE_PROVIDER_SITE_OTHER): Payer: Medicare Other | Admitting: Physical Medicine and Rehabilitation

## 2023-03-01 VITALS — BP 122/82 | HR 78

## 2023-03-01 DIAGNOSIS — M5416 Radiculopathy, lumbar region: Secondary | ICD-10-CM | POA: Diagnosis not present

## 2023-03-01 MED ORDER — METHYLPREDNISOLONE ACETATE 80 MG/ML IJ SUSP
80.0000 mg | Freq: Once | INTRAMUSCULAR | Status: AC
  Administered 2023-03-01: 80 mg

## 2023-03-01 NOTE — Patient Instructions (Signed)

## 2023-03-01 NOTE — Progress Notes (Signed)
Functional Pain Scale - descriptive words and definitions  Moderate (4)   Constantly aware of pain, can complete ADLs with modification/sleep marginally affected at times/passive distraction is of no use, but active distraction gives some relief. Moderate range order  Average Pain 4  Back hurts while standing/walking.  Sitting is good.  Back pain radiates int right hip. Takes Tylenol-PRN and tramadol   DIRECTV Asprin  +Driver, -BT, -Dye Allergies.

## 2023-03-10 ENCOUNTER — Ambulatory Visit
Admission: RE | Admit: 2023-03-10 | Discharge: 2023-03-10 | Disposition: A | Discharge status: Home / Self Care | Payer: Medicare Other | Source: Ambulatory Visit | Attending: Orthopedic Surgery | Admitting: Orthopedic Surgery

## 2023-03-10 DIAGNOSIS — M75121 Complete rotator cuff tear or rupture of right shoulder, not specified as traumatic: Secondary | ICD-10-CM | POA: Diagnosis not present

## 2023-03-10 DIAGNOSIS — M19011 Primary osteoarthritis, right shoulder: Secondary | ICD-10-CM | POA: Diagnosis not present

## 2023-03-10 DIAGNOSIS — G8929 Other chronic pain: Secondary | ICD-10-CM

## 2023-03-15 NOTE — Progress Notes (Signed)
Cassidy Garcia - 77 y.o. female MRN [id number]  Date of birth: 1946/06/06  Office Visit Note: Visit Date: 03/01/2023 PCP: Geoffry Paradise, MD Referred by: Geoffry Paradise, MD  Subjective: Chief Complaint  Patient presents with   Lower Back - Pain   HPI:  Cassidy Garcia is a 77 y.o. female who comes in today at the request of Dr. Burnard Bunting for planned Bilateral L5-S1 Lumbar Transforaminal epidural steroid injection with fluoroscopic guidance.  The patient has failed conservative care including home exercise, medications, time and activity modification.  This injection will be diagnostic and hopefully therapeutic.  Please see requesting physician notes for further details and justification.   ROS Otherwise per HPI.  Assessment & Plan: Visit Diagnoses:    ICD-10-CM   1. Lumbar radiculopathy  M54.16 XR C-ARM NO REPORT    Epidural Steroid injection    methylPREDNISolone acetate (DEPO-MEDROL) injection 80 mg      Plan: No additional findings.   Meds & Orders:  Meds ordered this encounter  Medications   methylPREDNISolone acetate (DEPO-MEDROL) injection 80 mg    Orders Placed This Encounter  Procedures   XR C-ARM NO REPORT   Epidural Steroid injection    Follow-up: Return for visit to requesting provider as needed.   Procedures: No procedures performed  Lumbosacral Transforaminal Epidural Steroid Injection - Sub-Pedicular Approach with Fluoroscopic Guidance  Patient: Cassidy Garcia      Date of Birth: 02-23-1946 MRN: [id number] PCP: Geoffry Paradise, MD      Visit Date: 03/01/2023   Universal Protocol:    Date/Time: 03/01/2023  Consent Given By: the patient  Position: PRONE  Additional Comments: Vital signs were monitored before and after the procedure. Patient was prepped and draped in the usual sterile fashion. The correct patient, procedure, and site was verified.   Injection Procedure Details:   Procedure diagnoses: Lumbar radiculopathy [M54.16]     Meds Administered:  Meds ordered this encounter  Medications   methylPREDNISolone acetate (DEPO-MEDROL) injection 80 mg    Laterality: Bilateral  Location/Site: L5  Needle:5.0 in., 22 ga.  Short bevel or Quincke spinal needle  Needle Placement: Transforaminal  Findings:    -Comments: Excellent flow of contrast along the nerve, nerve root and into the epidural space.  Procedure Details: After squaring off the end-plates to get a true AP view, the C-arm was positioned so that an oblique view of the foramen as noted above was visualized. The target area is just inferior to the "nose of the scotty dog" or sub pedicular. The soft tissues overlying this structure were infiltrated with 2-3 ml. of 1% Lidocaine without Epinephrine.  The spinal needle was inserted toward the target using a "trajectory" view along the fluoroscope beam.  Under AP and lateral visualization, the needle was advanced so it did not puncture dura and was located close the 6 O'Clock position of the pedical in AP tracterory. Biplanar projections were used to confirm position. Aspiration was confirmed to be negative for CSF and/or blood. A 1-2 ml. volume of Isovue-250 was injected and flow of contrast was noted at each level. Radiographs were obtained for documentation purposes.   After attaining the desired flow of contrast documented above, a 0.5 to 1.0 ml test dose of 0.25% Marcaine was injected into each respective transforaminal space.  The patient was observed for 90 seconds post injection.  After no sensory deficits were reported, and normal lower extremity motor function was noted,   the above injectate was administered  so that equal amounts of the injectate were placed at each foramen (level) into the transforaminal epidural space.   Additional Comments:  No complications occurred Dressing: 2 x 2 sterile gauze and Band-Aid    Post-procedure details: Patient was observed during the procedure. Post-procedure  instructions were reviewed.  Patient left the clinic in stable condition.    Clinical History: MRI LUMBAR SPINE WITHOUT CONTRAST   TECHNIQUE: Multiplanar, multisequence MR imaging of the lumbar spine was performed. No intravenous contrast was administered.   COMPARISON:  Radiographs December 02, 2021.   FINDINGS: Segmentation:  Standard.   Alignment: Dextroconvex scoliosis. Small anterolisthesis at L3-4 and L4-5. Small retrolisthesis at L1-2, L2-3 and L5-S1.   Vertebrae: No fracture, evidence of discitis, or bone lesion. Endplate degenerative changes with loss of disc height and Schmorl nodes throughout the lumbar spine with associated marrow edema at L3-4 and L4-5.   Conus medullaris and cauda equina: Conus extends to the L1 level. Conus and cauda equina appear normal.   Paraspinal and other soft tissues: Negative.   Disc levels:   T12-L1: Disc bulge and moderate facet degenerative changes without significant spinal canal or neural foraminal stenosis.   L1-2: Disc bulge, moderate facet degenerative changes and ligamentum flavum redundancy resulting in mild spinal canal stenosis with narrowing of the bilateral subarticular zones and mild bilateral foraminal.   L2-3: Disc bulge, moderate hypertrophic facet degenerative changes and ligamentum flavum redundancy resulting in mild spinal canal stenosis, mild right and moderate left neural foraminal narrowing.   L3-4: Disc bulge, prominent hypertrophic facet degenerative changes with bilateral joint effusion and ligamentum flavum redundancy resulting in severe spinal canal stenosis, mild right and severe left neural foraminal.   L4-5: Disc bulge, prominent hypertrophic facet degenerative changes with bilateral joint effusion and ligamentum flavum redundancy resulting in severe spinal canal stenosis, mild right and moderate left neural foraminal narrowing.   L5-S1: Disc bulge, prominent hypertrophic facet degenerative  changes with bilateral joint effusion and ligamentum flavum redundancy resulting in severe spinal canal stenosis and mild-to-moderate bilateral foraminal narrowing.   IMPRESSION: 1. 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 2. Multilevel neural foraminal narrowing severe on the left at L3-4 and moderate on the left at L2-3 and L4-5.     Electronically Signed   By: Baldemar Lenis M.D.   On: 01/21/2022 16:03     Objective:  VS:  HT:    WT:   BMI:     BP:122/82  HR:78bpm  TEMP: ( )  RESP:  Physical Exam Vitals and nursing note reviewed.  Constitutional:      General: She is not in acute distress.    Appearance: Normal appearance. She is not ill-appearing.  HENT:     Head: Normocephalic and atraumatic.     Right Ear: External ear normal.     Left Ear: External ear normal.  Eyes:     Extraocular Movements: Extraocular movements intact.  Cardiovascular:     Rate and Rhythm: Normal rate.     Pulses: Normal pulses.  Pulmonary:     Effort: Pulmonary effort is normal. No respiratory distress.  Abdominal:     General: There is no distension.     Palpations: Abdomen is soft.  Musculoskeletal:        General: Tenderness present.     Cervical back: Neck supple.     Right lower leg: No edema.     Left lower leg: No  edema.     Comments: Patient has good distal strength with no pain over the greater trochanters.  No clonus or focal weakness.  Skin:    Findings: No erythema, lesion or rash.  Neurological:     General: No focal deficit present.     Mental Status: She is alert and oriented to person, place, and time.     Sensory: No sensory deficit.     Motor: No weakness or abnormal muscle tone.     Coordination: Coordination normal.  Psychiatric:        Mood and Affect: Mood normal.        Behavior: Behavior normal.      Imaging: No results found.

## 2023-03-15 NOTE — Procedures (Signed)
Lumbosacral Transforaminal Epidural Steroid Injection - Sub-Pedicular Approach with Fluoroscopic Guidance  Patient: Cassidy Garcia      Date of Birth: Jul 02, 1946 MRN: [id number] PCP: Geoffry Paradise, MD      Visit Date: 03/01/2023   Universal Protocol:    Date/Time: 03/01/2023  Consent Given By: the patient  Position: PRONE  Additional Comments: Vital signs were monitored before and after the procedure. Patient was prepped and draped in the usual sterile fashion. The correct patient, procedure, and site was verified.   Injection Procedure Details:   Procedure diagnoses: Lumbar radiculopathy [M54.16]    Meds Administered:  Meds ordered this encounter  Medications   methylPREDNISolone acetate (DEPO-MEDROL) injection 80 mg    Laterality: Bilateral  Location/Site: L5  Needle:5.0 in., 22 ga.  Short bevel or Quincke spinal needle  Needle Placement: Transforaminal  Findings:    -Comments: Excellent flow of contrast along the nerve, nerve root and into the epidural space.  Procedure Details: After squaring off the end-plates to get a true AP view, the C-arm was positioned so that an oblique view of the foramen as noted above was visualized. The target area is just inferior to the "nose of the scotty dog" or sub pedicular. The soft tissues overlying this structure were infiltrated with 2-3 ml. of 1% Lidocaine without Epinephrine.  The spinal needle was inserted toward the target using a "trajectory" view along the fluoroscope beam.  Under AP and lateral visualization, the needle was advanced so it did not puncture dura and was located close the 6 O'Clock position of the pedical in AP tracterory. Biplanar projections were used to confirm position. Aspiration was confirmed to be negative for CSF and/or blood. A 1-2 ml. volume of Isovue-250 was injected and flow of contrast was noted at each level. Radiographs were obtained for documentation purposes.   After attaining the desired  flow of contrast documented above, a 0.5 to 1.0 ml test dose of 0.25% Marcaine was injected into each respective transforaminal space.  The patient was observed for 90 seconds post injection.  After no sensory deficits were reported, and normal lower extremity motor function was noted,   the above injectate was administered so that equal amounts of the injectate were placed at each foramen (level) into the transforaminal epidural space.   Additional Comments:  No complications occurred Dressing: 2 x 2 sterile gauze and Band-Aid    Post-procedure details: Patient was observed during the procedure. Post-procedure instructions were reviewed.  Patient left the clinic in stable condition.

## 2023-03-21 ENCOUNTER — Telehealth: Payer: Self-pay

## 2023-03-21 NOTE — Telephone Encounter (Signed)
Patient left voicemail stating the injection did not help and she felt worse immediately after injection. She is starting to have more hip pain. She stated Dr. Alvester Morin informed her that he only used half the amount of medication and she was wondering if another injection can be performed. She is having shoulder replacement surgery 9/10. Please advise

## 2023-03-23 DIAGNOSIS — D1801 Hemangioma of skin and subcutaneous tissue: Secondary | ICD-10-CM | POA: Diagnosis not present

## 2023-03-23 DIAGNOSIS — Z85828 Personal history of other malignant neoplasm of skin: Secondary | ICD-10-CM | POA: Diagnosis not present

## 2023-03-23 DIAGNOSIS — L821 Other seborrheic keratosis: Secondary | ICD-10-CM | POA: Diagnosis not present

## 2023-03-23 DIAGNOSIS — D225 Melanocytic nevi of trunk: Secondary | ICD-10-CM | POA: Diagnosis not present

## 2023-03-23 DIAGNOSIS — D2271 Melanocytic nevi of right lower limb, including hip: Secondary | ICD-10-CM | POA: Diagnosis not present

## 2023-03-23 DIAGNOSIS — D2272 Melanocytic nevi of left lower limb, including hip: Secondary | ICD-10-CM | POA: Diagnosis not present

## 2023-03-28 ENCOUNTER — Other Ambulatory Visit: Payer: Self-pay | Admitting: Physical Medicine and Rehabilitation

## 2023-03-28 DIAGNOSIS — M5416 Radiculopathy, lumbar region: Secondary | ICD-10-CM

## 2023-03-28 DIAGNOSIS — M48062 Spinal stenosis, lumbar region with neurogenic claudication: Secondary | ICD-10-CM

## 2023-03-28 NOTE — Telephone Encounter (Signed)
Spoke with patient and she wants to try another injection. She states she is not ready to talk with a Careers adviser. She would like to have the referral sent to [facility]

## 2023-04-04 ENCOUNTER — Ambulatory Visit
Admission: RE | Admit: 2023-04-04 | Discharge: 2023-04-04 | Disposition: A | Discharge status: Home / Self Care | Payer: Medicare Other | Source: Ambulatory Visit | Attending: Physical Medicine and Rehabilitation | Admitting: Physical Medicine and Rehabilitation

## 2023-04-04 ENCOUNTER — Other Ambulatory Visit: Payer: Self-pay | Admitting: Physical Medicine and Rehabilitation

## 2023-04-04 DIAGNOSIS — M4727 Other spondylosis with radiculopathy, lumbosacral region: Secondary | ICD-10-CM | POA: Diagnosis not present

## 2023-04-04 DIAGNOSIS — M48062 Spinal stenosis, lumbar region with neurogenic claudication: Secondary | ICD-10-CM

## 2023-04-04 DIAGNOSIS — M5416 Radiculopathy, lumbar region: Secondary | ICD-10-CM

## 2023-04-04 DIAGNOSIS — M48061 Spinal stenosis, lumbar region without neurogenic claudication: Secondary | ICD-10-CM | POA: Diagnosis not present

## 2023-04-04 MED ORDER — IOPAMIDOL (ISOVUE-M 200) INJECTION 41%
1.0000 mL | Freq: Once | INTRAMUSCULAR | Status: AC
  Administered 2023-04-04: 1 mL via EPIDURAL

## 2023-04-04 MED ORDER — METHYLPREDNISOLONE ACETATE 40 MG/ML INJ SUSP (RADIOLOG
80.0000 mg | Freq: Once | INTRAMUSCULAR | Status: AC
  Administered 2023-04-04: 80 mg via EPIDURAL

## 2023-04-04 NOTE — Discharge Instructions (Signed)

## 2023-04-15 NOTE — Pre-Procedure Instructions (Signed)
Surgical Instructions   Your procedure is scheduled on Tuesday, September 10th. Report to [facility] Main Entrance "A" at 05:30 A.M., then check in with the Admitting office. Any questions or running late day of surgery: call [phone]  Questions prior to your surgery date: call [phone], Monday-Friday, 8am-4pm. If you experience any cold or flu symptoms such as cough, fever, chills, shortness of breath, etc. between now and your scheduled surgery, please notify us at the above number.     Remember:  Do not eat after midnight the night before your surgery  You may drink clear liquids until 04:30 AM the morning of your surgery.   Clear liquids allowed are: Water, Non-Citrus Juices (without pulp), Carbonated Beverages, Clear Tea, Black Coffee Only (NO MILK, CREAM OR POWDERED CREAMER of any kind), and Gatorade.  Patient Instructions  The night before surgery:  No food after midnight. ONLY clear liquids after midnight  The day of surgery (if you do NOT have diabetes):  Drink ONE (1) Pre-Surgery Clear Ensure by 04:30 AM the morning of surgery. Drink in one sitting. Do not sip.  This drink was given to you during your hospital  pre-op appointment visit.  Nothing else to drink after completing the  Pre-Surgery Clear Ensure.          If you have questions, please contact your surgeon's office.    Take these medicines the morning of surgery with A SIP OF WATER  atorvastatin (LIPITOR)  levothyroxine (SYNTHROID)  omeprazole (PRILOSEC)     May take these medicines IF NEEDED: acetaminophen-codeine (TYLENOL #3)  albuterol (VENTOLIN HFA)- bring inhaler with you on day of surgery famotidine (PEPCID)  oxymetazoline (AFRIN)    Follow your surgeon's instructions on when to stop Aspirin.  If no instructions were given by your surgeon then you will need to call the office to get those instructions.     One week prior to surgery, STOP taking any Aleve, Naproxen, Ibuprofen, Motrin,  Advil, Goody's, BC's, all herbal medications, fish oil, and non-prescription vitamins.                     Do NOT Smoke (Tobacco/Vaping) for 24 hours prior to your procedure.  If you use a CPAP at night, you may bring your mask/headgear for your overnight stay.   You will be asked to remove any contacts, glasses, piercing's, hearing aid's, dentures/partials prior to surgery. Please bring cases for these items if needed.    Patients discharged the day of surgery will not be allowed to drive home, and someone needs to stay with them for 24 hours.  SURGICAL WAITING ROOM VISITATION Patients may have no more than 2 support people in the waiting area - these visitors may rotate.   Pre-op nurse will coordinate an appropriate time for 1 ADULT support person, who may not rotate, to accompany patient in pre-op.  Children under the age of 20 must have an adult with them who is not the patient and must remain in the main waiting area with an adult.  If the patient needs to stay at the hospital during part of their recovery, the visitor guidelines for inpatient rooms apply.  Please refer to the [facility] website for the visitor guidelines for any additional information.   If you received a COVID test during your pre-op visit  it is requested that you wear a mask when out in public, stay away from anyone that may not be feeling well and notify your surgeon  if you develop symptoms. If you have been in contact with anyone that has tested positive in the last 10 days please notify you surgeon.      Pre-operative 5 CHG Bathing Instructions   You can play a key role in reducing the risk of infection after surgery. Your skin needs to be as free of germs as possible. You can reduce the number of germs on your skin by washing with CHG (chlorhexidine gluconate) soap before surgery. CHG is an antiseptic soap that kills germs and continues to kill germs even after washing.   DO NOT use if you have an allergy  to chlorhexidine/CHG or antibacterial soaps. If your skin becomes reddened or irritated, stop using the CHG and notify one of our RNs at [phone].   Please shower with the CHG soap starting 4 days before surgery using the following schedule:     Please keep in mind the following:  DO NOT shave, including legs and underarms, starting the day of your first shower.   You may shave your face at any point before/day of surgery.  Place clean sheets on your bed the day you start using CHG soap. Use a clean washcloth (not used since being washed) for each shower. DO NOT sleep with pets once you start using the CHG.   CHG Shower Instructions:  If you choose to wash your hair and private area, wash first with your normal shampoo/soap.  After you use shampoo/soap, rinse your hair and body thoroughly to remove shampoo/soap residue.  Turn the water OFF and apply about 3 tablespoons (45 ml) of CHG soap to a CLEAN washcloth.  Apply CHG soap ONLY FROM YOUR NECK DOWN TO YOUR TOES (washing for 3-5 minutes)  DO NOT use CHG soap on face, private areas, open wounds, or sores.  Pay special attention to the area where your surgery is being performed.  If you are having back surgery, having someone wash your back for you may be helpful. Wait 2 minutes after CHG soap is applied, then you may rinse off the CHG soap.  Pat dry with a clean towel  Put on clean clothes/pajamas   If you choose to wear lotion, please use ONLY the CHG-compatible lotions on the back of this paper.   Additional instructions for the day of surgery: DO NOT APPLY any lotions, deodorants, cologne, or perfumes.   Do not bring valuables to the hospital. [facility] is not responsible for any belongings/valuables. Do not wear nail polish, gel polish, artificial nails, or any other type of covering on natural nails (fingers and toes) Do not wear jewelry or makeup Put on clean/comfortable clothes.  Please brush your teeth.  Ask your nurse  before applying any prescription medications to the skin.     CHG Compatible Lotions   Aveeno Moisturizing lotion  Cetaphil Moisturizing Cream  Cetaphil Moisturizing Lotion  Clairol Herbal Essence Moisturizing Lotion, Dry Skin  Clairol Herbal Essence Moisturizing Lotion, Extra Dry Skin  Clairol Herbal Essence Moisturizing Lotion, Normal Skin  Curel Age Defying Therapeutic Moisturizing Lotion with Alpha Hydroxy  Curel Extreme Care Body Lotion  Curel Soothing Hands Moisturizing Hand Lotion  Curel Therapeutic Moisturizing Cream, Fragrance-Free  Curel Therapeutic Moisturizing Lotion, Fragrance-Free  Curel Therapeutic Moisturizing Lotion, Original Formula  Eucerin Daily Replenishing Lotion  Eucerin Dry Skin Therapy Plus Alpha Hydroxy Crme  Eucerin Dry Skin Therapy Plus Alpha Hydroxy Lotion  Eucerin Original Crme  Eucerin Original Lotion  Eucerin Plus Crme Eucerin Plus Lotion  Eucerin TriLipid  Replenishing Lotion  Keri Anti-Bacterial Hand Lotion  Keri Deep Conditioning Original Lotion Dry Skin Formula Softly Scented  Keri Deep Conditioning Original Lotion, Fragrance Free Sensitive Skin Formula  Keri Lotion Fast Absorbing Fragrance Free Sensitive Skin Formula  Keri Lotion Fast Absorbing Softly Scented Dry Skin Formula  Keri Original Lotion  Keri Skin Renewal Lotion Keri Silky Smooth Lotion  Keri Silky Smooth Sensitive Skin Lotion  Nivea Body Creamy Conditioning Oil  Nivea Body Extra Enriched Lotion  Nivea Body Original Lotion  Nivea Body Sheer Moisturizing Lotion Nivea Crme  Nivea Skin Firming Lotion  NutraDerm 30 Skin Lotion  NutraDerm Skin Lotion  NutraDerm Therapeutic Skin Cream  NutraDerm Therapeutic Skin Lotion  ProShield Protective Hand Cream  Provon moisturizing lotion   Please follow these instructions carefully:  BENZOYL PEROXIDE 5% GEL  Please do not use if you have an allergy to benzoyl peroxide. If your skin becomes reddened/irritated stop using the benzoyl  peroxide.  Starting two days before surgery, apply as follows:  1. Apply benzoyl peroxide in the morning and at night. Apply after taking a shower. If you are not taking a shower clean entire shoulder front, back, and side along with the armpit with a clean wet washcloth.  2. Place a quarter-sized dollop on your SHOULDER and rub in thoroughly, making sure to cover the front, back, and side of your shoulder, along with the armpit.   2 Days prior to Surgery First Dose on _____________ Morning Second Dose on ______________ Night  Day Before Surgery First Dose on ______________ Morning Night before surgery wash (entire body except face and private areas) with CHG Soap THEN Second Dose on ____________ Night   Morning of Surgery  wash BODY AGAIN with CHG Soap   4. Do NOT apply benzoyl peroxide gel on the day of surgery       Please read over the following fact sheets that you were given.

## 2023-04-19 ENCOUNTER — Other Ambulatory Visit: Payer: Self-pay

## 2023-04-19 ENCOUNTER — Encounter (HOSPITAL_COMMUNITY)
Admission: RE | Admit: 2023-04-19 | Discharge: 2023-04-19 | Disposition: A | Discharge status: Home / Self Care | Payer: Medicare Other | Source: Ambulatory Visit | Attending: Orthopedic Surgery | Admitting: Orthopedic Surgery

## 2023-04-19 ENCOUNTER — Encounter (HOSPITAL_COMMUNITY): Payer: Self-pay

## 2023-04-19 VITALS — BP 146/69 | HR 81 | Temp 98.5°F | Resp 17 | Ht 65.0 in | Wt 205.3 lb

## 2023-04-19 DIAGNOSIS — I10 Essential (primary) hypertension: Secondary | ICD-10-CM | POA: Insufficient documentation

## 2023-04-19 DIAGNOSIS — Z01818 Encounter for other preprocedural examination: Secondary | ICD-10-CM | POA: Diagnosis not present

## 2023-04-19 HISTORY — DX: Personal history of other diseases of the digestive system: Z87.19

## 2023-04-19 LAB — URINALYSIS, W/ REFLEX TO CULTURE (INFECTION SUSPECTED)
Bilirubin Urine: NEGATIVE
Glucose, UA: NEGATIVE mg/dL
Hgb urine dipstick: NEGATIVE
Ketones, ur: NEGATIVE mg/dL
Nitrite: NEGATIVE
Protein, ur: NEGATIVE mg/dL
Specific Gravity, Urine: 1.015 (ref 1.005–1.030)
pH: 6 (ref 5.0–8.0)

## 2023-04-19 LAB — CBC
HCT: 36.4 % (ref 36.0–46.0)
Hemoglobin: 12.2 g/dL (ref 12.0–15.0)
MCH: 32.6 pg (ref 26.0–34.0)
MCHC: 33.5 g/dL (ref 30.0–36.0)
MCV: 97.3 fL (ref 80.0–100.0)
Platelets: 178 10*3/uL (ref 150–400)
RBC: 3.74 MIL/uL — ABNORMAL LOW (ref 3.87–5.11)
RDW: 13.2 % (ref 11.5–15.5)
WBC: 8.1 10*3/uL (ref 4.0–10.5)
nRBC: 0 % (ref 0.0–0.2)

## 2023-04-19 LAB — BASIC METABOLIC PANEL
Anion gap: 12 (ref 5–15)
BUN: 27 mg/dL — ABNORMAL HIGH (ref 8–23)
CO2: 24 mmol/L (ref 22–32)
Calcium: 9.4 mg/dL (ref 8.9–10.3)
Chloride: 106 mmol/L (ref 98–111)
Creatinine, Ser: 1.22 mg/dL — ABNORMAL HIGH (ref 0.44–1.00)
GFR, Estimated: 46 mL/min — ABNORMAL LOW (ref >60)
Glucose, Bld: 100 mg/dL — ABNORMAL HIGH (ref 70–99)
Potassium: 4.3 mmol/L (ref 3.5–5.1)
Sodium: 142 mmol/L (ref 135–145)

## 2023-04-19 LAB — SURGICAL PCR SCREEN
MRSA, PCR: NEGATIVE
Staphylococcus aureus: NEGATIVE

## 2023-04-19 NOTE — Progress Notes (Signed)
PCP - Dr. Geoffry Paradise Cardiologist - denies  PPM/ICD - denies   Chest x-ray - 02/16/15 EKG - 04/19/23 Stress Test - 05/2008 ECHO - denies Cardiac Cath - denies  Sleep Study - denies   DM- denies  Blood Thinner Instructions: n/a Aspirin Instructions: f/u with surgeon  ERAS Protcol - yes PRE-SURGERY Ensure given at PAT   COVID TEST- n/a   Anesthesia review: no  Patient denies shortness of breath, fever, cough and chest pain at PAT appointment   All instructions explained to the patient, with a verbal understanding of the material. Patient agrees to go over the instructions while at home for a better understanding.  The opportunity to ask questions was provided.

## 2023-04-20 ENCOUNTER — Encounter: Payer: Self-pay | Admitting: Orthopedic Surgery

## 2023-04-23 ENCOUNTER — Emergency Department (HOSPITAL_COMMUNITY): Payer: Medicare Other

## 2023-04-23 ENCOUNTER — Emergency Department (HOSPITAL_COMMUNITY)
Admission: EM | Admit: 2023-04-23 | Discharge: 2023-04-23 | Disposition: A | Discharge status: Home / Self Care | Payer: Medicare Other | Attending: Emergency Medicine | Admitting: Emergency Medicine

## 2023-04-23 ENCOUNTER — Encounter (HOSPITAL_COMMUNITY): Payer: Self-pay | Admitting: Emergency Medicine

## 2023-04-23 ENCOUNTER — Other Ambulatory Visit: Payer: Self-pay

## 2023-04-23 DIAGNOSIS — R1032 Left lower quadrant pain: Secondary | ICD-10-CM | POA: Insufficient documentation

## 2023-04-23 DIAGNOSIS — Z79899 Other long term (current) drug therapy: Secondary | ICD-10-CM | POA: Insufficient documentation

## 2023-04-23 DIAGNOSIS — J45909 Unspecified asthma, uncomplicated: Secondary | ICD-10-CM | POA: Insufficient documentation

## 2023-04-23 DIAGNOSIS — Z7982 Long term (current) use of aspirin: Secondary | ICD-10-CM | POA: Diagnosis not present

## 2023-04-23 DIAGNOSIS — S301XXA Contusion of abdominal wall, initial encounter: Secondary | ICD-10-CM | POA: Diagnosis not present

## 2023-04-23 DIAGNOSIS — R1084 Generalized abdominal pain: Secondary | ICD-10-CM | POA: Diagnosis not present

## 2023-04-23 DIAGNOSIS — N2889 Other specified disorders of kidney and ureter: Secondary | ICD-10-CM | POA: Diagnosis not present

## 2023-04-23 DIAGNOSIS — N281 Cyst of kidney, acquired: Secondary | ICD-10-CM | POA: Diagnosis not present

## 2023-04-23 DIAGNOSIS — I10 Essential (primary) hypertension: Secondary | ICD-10-CM | POA: Insufficient documentation

## 2023-04-23 LAB — COMPREHENSIVE METABOLIC PANEL
ALT: 27 U/L (ref 0–44)
AST: 22 U/L (ref 15–41)
Albumin: 4 g/dL (ref 3.5–5.0)
Alkaline Phosphatase: 74 U/L (ref 38–126)
Anion gap: 11 (ref 5–15)
BUN: 38 mg/dL — ABNORMAL HIGH (ref 8–23)
CO2: 24 mmol/L (ref 22–32)
Calcium: 9.4 mg/dL (ref 8.9–10.3)
Chloride: 106 mmol/L (ref 98–111)
Creatinine, Ser: 1.46 mg/dL — ABNORMAL HIGH (ref 0.44–1.00)
GFR, Estimated: 37 mL/min — ABNORMAL LOW (ref >60)
Glucose, Bld: 94 mg/dL (ref 70–99)
Potassium: 3.9 mmol/L (ref 3.5–5.1)
Sodium: 141 mmol/L (ref 135–145)
Total Bilirubin: 1.4 mg/dL — ABNORMAL HIGH (ref 0.3–1.2)
Total Protein: 6.5 g/dL (ref 6.5–8.1)

## 2023-04-23 LAB — CBC WITH DIFFERENTIAL/PLATELET
Abs Immature Granulocytes: 0.01 10*3/uL (ref 0.00–0.07)
Basophils Absolute: 0 10*3/uL (ref 0.0–0.1)
Basophils Relative: 1 %
Eosinophils Absolute: 0.2 10*3/uL (ref 0.0–0.5)
Eosinophils Relative: 3 %
HCT: 38 % (ref 36.0–46.0)
Hemoglobin: 12.5 g/dL (ref 12.0–15.0)
Immature Granulocytes: 0 %
Lymphocytes Relative: 21 %
Lymphs Abs: 1.3 10*3/uL (ref 0.7–4.0)
MCH: 31.7 pg (ref 26.0–34.0)
MCHC: 32.9 g/dL (ref 30.0–36.0)
MCV: 96.4 fL (ref 80.0–100.0)
Monocytes Absolute: 0.5 10*3/uL (ref 0.1–1.0)
Monocytes Relative: 8 %
Neutro Abs: 4.4 10*3/uL (ref 1.7–7.7)
Neutrophils Relative %: 67 %
Platelets: 196 10*3/uL (ref 150–400)
RBC: 3.94 MIL/uL (ref 3.87–5.11)
RDW: 13.1 % (ref 11.5–15.5)
WBC: 6.5 10*3/uL (ref 4.0–10.5)
nRBC: 0 % (ref 0.0–0.2)

## 2023-04-23 LAB — LIPASE, BLOOD: Lipase: 22 U/L (ref 11–51)

## 2023-04-23 MED ORDER — IOHEXOL 300 MG/ML  SOLN
80.0000 mL | Freq: Once | INTRAMUSCULAR | Status: AC | PRN
  Administered 2023-04-23: 80 mL via INTRAVENOUS

## 2023-04-23 NOTE — Discharge Instructions (Signed)
Your CT scan was reassuring.  I recommend you follow-up closely with your primary care provider.  If develop severe abdominal pain or any other new concerning symptoms you should return to the ED.
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
## 2023-04-25 ENCOUNTER — Telehealth: Payer: Self-pay | Admitting: *Deleted

## 2023-04-25 NOTE — Care Plan (Signed)
[facility] RNCM call to patient to discuss her upcoming Right Reverse Shoulder arthroplasty with Dr. August Saucer on 04/26/23. She is an Ortho bundle patient and is agreeable to case management. She has a son that will be helping her after discharge. She will be returning to her home. She has a CPM provided by [employer] already. No other DME needed. Anticipate no HHPT will be needed after short hospital stay. She would like to be set up for OPPT at [facility] when appropriate. CM will assist. Reviewed post op care instructions. Will continue to follow for needs. SANE (single assessment numerical evaluation) per patient is 60%.

## 2023-04-25 NOTE — Anesthesia Preprocedure Evaluation (Signed)
Anesthesia Evaluation  Patient identified by MRN, date of birth, ID band Patient awake    Reviewed: Allergy & Precautions, NPO status , Patient's Chart, lab work & pertinent test results  Airway Mallampati: II  TM Distance: >3 FB Neck ROM: Full    Dental no notable dental hx. (+) Dental Advisory Given, Teeth Intact   Pulmonary shortness of breath, asthma , neg recent URI, former smoker   Pulmonary exam normal breath sounds clear to auscultation       Cardiovascular hypertension, Pt. on medications Normal cardiovascular exam Rhythm:Regular Rate:Normal     Neuro/Psych  PSYCHIATRIC DISORDERS  Depression       GI/Hepatic Neg liver ROS, hiatal hernia,GERD  Medicated and Controlled,,  Endo/Other  Hypothyroidism    Renal/GU CRFRenal disease     Musculoskeletal  (+) Arthritis ,    Abdominal  (+) + obese  Peds  Hematology  (+) Blood dyscrasia, anemia   Anesthesia Other Findings   Reproductive/Obstetrics                             Anesthesia Physical Anesthesia Plan  ASA: 3  Anesthesia Plan: General   Post-op Pain Management: Regional block* and Tylenol PO (pre-op)*   Induction: Intravenous  PONV Risk Score and Plan: 4 or greater and Ondansetron, Treatment may vary due to age or medical condition and Dexamethasone  Airway Management Planned: Oral ETT  Additional Equipment: None  Intra-op Plan:   Post-operative Plan: Extubation in OR  Informed Consent: I have reviewed the patients History and Physical, chart, labs and discussed the procedure including the risks, benefits and alternatives for the proposed anesthesia with the patient or authorized representative who has indicated his/her understanding and acceptance.     Dental advisory given  Plan Discussed with: CRNA  Anesthesia Plan Comments:         Anesthesia Quick Evaluation

## 2023-04-25 NOTE — Telephone Encounter (Signed)
Attempted pre-op call to patient; no answer and left VM requesting call back.

## 2023-04-25 NOTE — Telephone Encounter (Signed)
Ortho bundle pre-op call completed. SANE (Single assessment numeric evaluation) is 60% per patient.

## 2023-04-26 ENCOUNTER — Other Ambulatory Visit: Payer: Self-pay

## 2023-04-26 ENCOUNTER — Ambulatory Visit (HOSPITAL_COMMUNITY): Payer: Self-pay | Admitting: Anesthesiology

## 2023-04-26 ENCOUNTER — Observation Stay (HOSPITAL_COMMUNITY)
Admission: RE | Admit: 2023-04-26 | Discharge: 2023-04-27 | Disposition: A | Discharge status: Home / Self Care | Payer: Medicare Other | Attending: Orthopedic Surgery | Admitting: Orthopedic Surgery

## 2023-04-26 ENCOUNTER — Encounter (HOSPITAL_COMMUNITY): Payer: Self-pay | Admitting: Orthopedic Surgery

## 2023-04-26 ENCOUNTER — Encounter (HOSPITAL_COMMUNITY)
Admission: RE | Disposition: A | Discharge status: Home / Self Care | Payer: Self-pay | Source: Home / Self Care | Attending: Orthopedic Surgery

## 2023-04-26 ENCOUNTER — Ambulatory Visit (HOSPITAL_BASED_OUTPATIENT_CLINIC_OR_DEPARTMENT_OTHER): Payer: Self-pay | Admitting: Anesthesiology

## 2023-04-26 ENCOUNTER — Observation Stay (HOSPITAL_COMMUNITY): Payer: Medicare Other

## 2023-04-26 DIAGNOSIS — N183 Chronic kidney disease, stage 3 unspecified: Secondary | ICD-10-CM

## 2023-04-26 DIAGNOSIS — Z85828 Personal history of other malignant neoplasm of skin: Secondary | ICD-10-CM | POA: Insufficient documentation

## 2023-04-26 DIAGNOSIS — Z87891 Personal history of nicotine dependence: Secondary | ICD-10-CM | POA: Insufficient documentation

## 2023-04-26 DIAGNOSIS — M19011 Primary osteoarthritis, right shoulder: Secondary | ICD-10-CM

## 2023-04-26 DIAGNOSIS — M75101 Unspecified rotator cuff tear or rupture of right shoulder, not specified as traumatic: Principal | ICD-10-CM | POA: Insufficient documentation

## 2023-04-26 DIAGNOSIS — J45909 Unspecified asthma, uncomplicated: Secondary | ICD-10-CM | POA: Insufficient documentation

## 2023-04-26 DIAGNOSIS — I129 Hypertensive chronic kidney disease with stage 1 through stage 4 chronic kidney disease, or unspecified chronic kidney disease: Secondary | ICD-10-CM | POA: Diagnosis not present

## 2023-04-26 DIAGNOSIS — Z96611 Presence of right artificial shoulder joint: Secondary | ICD-10-CM | POA: Diagnosis not present

## 2023-04-26 DIAGNOSIS — M7521 Bicipital tendinitis, right shoulder: Secondary | ICD-10-CM

## 2023-04-26 DIAGNOSIS — Z96641 Presence of right artificial hip joint: Secondary | ICD-10-CM | POA: Insufficient documentation

## 2023-04-26 DIAGNOSIS — Z79899 Other long term (current) drug therapy: Secondary | ICD-10-CM | POA: Insufficient documentation

## 2023-04-26 DIAGNOSIS — Z01818 Encounter for other preprocedural examination: Principal | ICD-10-CM

## 2023-04-26 DIAGNOSIS — E039 Hypothyroidism, unspecified: Secondary | ICD-10-CM | POA: Diagnosis not present

## 2023-04-26 DIAGNOSIS — Z7982 Long term (current) use of aspirin: Secondary | ICD-10-CM | POA: Diagnosis not present

## 2023-04-26 DIAGNOSIS — Z471 Aftercare following joint replacement surgery: Secondary | ICD-10-CM | POA: Diagnosis not present

## 2023-04-26 DIAGNOSIS — M12511 Traumatic arthropathy, right shoulder: Secondary | ICD-10-CM | POA: Diagnosis not present

## 2023-04-26 HISTORY — PX: BICEPT TENODESIS: SHX5116

## 2023-04-26 HISTORY — PX: REVERSE SHOULDER ARTHROPLASTY: SHX5054

## 2023-04-26 SURGERY — ARTHROPLASTY, SHOULDER, TOTAL, REVERSE
Anesthesia: General | Site: Shoulder | Laterality: Right

## 2023-04-26 MED ORDER — CEFAZOLIN SODIUM-DEXTROSE 2-4 GM/100ML-% IV SOLN
2.0000 g | INTRAVENOUS | Status: AC
  Administered 2023-04-26: 2 g via INTRAVENOUS
  Filled 2023-04-26: qty 100

## 2023-04-26 MED ORDER — METHOCARBAMOL 500 MG PO TABS: 500.0000 mg | ORAL_TABLET | Freq: Four times a day (QID) | ORAL | Status: DC | PRN

## 2023-04-26 MED ORDER — ROCURONIUM BROMIDE 10 MG/ML (PF) SYRINGE
PREFILLED_SYRINGE | INTRAVENOUS | Status: DC | PRN
  Administered 2023-04-26: 60 mg via INTRAVENOUS

## 2023-04-26 MED ORDER — PHENYLEPHRINE 80 MCG/ML (10ML) SYRINGE FOR IV PUSH (FOR BLOOD PRESSURE SUPPORT)
PREFILLED_SYRINGE | INTRAVENOUS | Status: AC
  Filled 2023-04-26: qty 10

## 2023-04-26 MED ORDER — METHOCARBAMOL 1000 MG/10ML IJ SOLN: 500.0000 mg | Freq: Four times a day (QID) | INTRAVENOUS | Status: DC | PRN

## 2023-04-26 MED ORDER — MAGNESIUM 200 MG PO CHEW: 200.0000 mg | CHEWABLE_TABLET | Freq: Every day | ORAL | Status: DC

## 2023-04-26 MED ORDER — PHENYLEPHRINE HCL-NACL 20-0.9 MG/250ML-% IV SOLN
INTRAVENOUS | Status: DC | PRN
Start: 2023-04-26 — End: 2023-04-26
  Administered 2023-04-26: 40 ug/min via INTRAVENOUS

## 2023-04-26 MED ORDER — LOSARTAN POTASSIUM 50 MG PO TABS
100.0000 mg | ORAL_TABLET | Freq: Every day | ORAL | Status: DC
  Administered 2023-04-27: 100 mg via ORAL
  Filled 2023-04-26: qty 2

## 2023-04-26 MED ORDER — PROPOFOL 10 MG/ML IV BOLUS
INTRAVENOUS | Status: DC | PRN
  Administered 2023-04-26: 120 mg via INTRAVENOUS

## 2023-04-26 MED ORDER — DEXAMETHASONE SODIUM PHOSPHATE 10 MG/ML IJ SOLN
INTRAMUSCULAR | Status: DC | PRN
  Administered 2023-04-26: 5 mg via INTRAVENOUS

## 2023-04-26 MED ORDER — ACETAMINOPHEN 500 MG PO TABS
1000.0000 mg | ORAL_TABLET | Freq: Once | ORAL | Status: AC
  Administered 2023-04-26: 1000 mg via ORAL
  Filled 2023-04-26: qty 2

## 2023-04-26 MED ORDER — MIDAZOLAM HCL 2 MG/2ML IJ SOLN
INTRAMUSCULAR | Status: DC | PRN
  Administered 2023-04-26: 1 mg via INTRAVENOUS

## 2023-04-26 MED ORDER — MENTHOL 3 MG MT LOZG: 1.0000 | LOZENGE | OROMUCOSAL | Status: DC | PRN

## 2023-04-26 MED ORDER — ASPIRIN 81 MG PO TBEC
81.0000 mg | DELAYED_RELEASE_TABLET | Freq: Every day | ORAL | Status: DC
  Administered 2023-04-26 – 2023-04-27 (×2): 81 mg via ORAL
  Filled 2023-04-26 (×2): qty 1

## 2023-04-26 MED ORDER — ONDANSETRON HCL 4 MG/2ML IJ SOLN: 4.0000 mg | Freq: Four times a day (QID) | INTRAMUSCULAR | Status: DC | PRN

## 2023-04-26 MED ORDER — SUGAMMADEX SODIUM 200 MG/2ML IV SOLN
INTRAVENOUS | Status: DC | PRN
  Administered 2023-04-26: 200 mg via INTRAVENOUS

## 2023-04-26 MED ORDER — METOCLOPRAMIDE HCL 5 MG PO TABS: 5.0000 mg | ORAL_TABLET | Freq: Three times a day (TID) | ORAL | Status: DC | PRN

## 2023-04-26 MED ORDER — LOSARTAN POTASSIUM-HCTZ 100-25 MG PO TABS: 1.0000 | ORAL_TABLET | Freq: Every day | ORAL | Status: DC

## 2023-04-26 MED ORDER — IRRISEPT - 450ML BOTTLE WITH 0.05% CHG IN STERILE WATER, USP 99.95% OPTIME
TOPICAL | Status: DC | PRN
  Administered 2023-04-26: 450 mL via TOPICAL

## 2023-04-26 MED ORDER — MIDAZOLAM HCL 2 MG/2ML IJ SOLN
INTRAMUSCULAR | Status: AC
  Filled 2023-04-26: qty 2

## 2023-04-26 MED ORDER — PROPOFOL 10 MG/ML IV BOLUS
INTRAVENOUS | Status: AC
  Filled 2023-04-26: qty 20

## 2023-04-26 MED ORDER — AMITRIPTYLINE HCL 50 MG PO TABS
75.0000 mg | ORAL_TABLET | Freq: Every day | ORAL | Status: DC
  Administered 2023-04-26: 75 mg via ORAL
  Filled 2023-04-26: qty 1
  Filled 2023-04-26: qty 2

## 2023-04-26 MED ORDER — LEVOTHYROXINE SODIUM 25 MCG PO TABS
25.0000 ug | ORAL_TABLET | Freq: Every day | ORAL | Status: DC
  Administered 2023-04-27: 25 ug via ORAL
  Filled 2023-04-26: qty 1

## 2023-04-26 MED ORDER — POVIDONE-IODINE 10 % EX SWAB
2.0000 | Freq: Once | CUTANEOUS | Status: AC
  Administered 2023-04-26: 2 via TOPICAL

## 2023-04-26 MED ORDER — VANCOMYCIN HCL 1000 MG IV SOLR
INTRAVENOUS | Status: DC | PRN
  Administered 2023-04-26: 1000 mg via TOPICAL

## 2023-04-26 MED ORDER — 0.9 % SODIUM CHLORIDE (POUR BTL) OPTIME
TOPICAL | Status: DC | PRN
  Administered 2023-04-26: 4000 mL
  Administered 2023-04-26: 1000 mL

## 2023-04-26 MED ORDER — ACETAMINOPHEN 325 MG PO TABS: 325.0000 mg | ORAL_TABLET | Freq: Four times a day (QID) | ORAL | Status: DC | PRN

## 2023-04-26 MED ORDER — EPHEDRINE SULFATE-NACL 50-0.9 MG/10ML-% IV SOSY
PREFILLED_SYRINGE | INTRAVENOUS | Status: DC | PRN
  Administered 2023-04-26 (×5): 5 mg via INTRAVENOUS

## 2023-04-26 MED ORDER — DOCUSATE SODIUM 100 MG PO CAPS
100.0000 mg | ORAL_CAPSULE | Freq: Two times a day (BID) | ORAL | Status: DC
  Administered 2023-04-26 – 2023-04-27 (×3): 100 mg via ORAL
  Filled 2023-04-26 (×3): qty 1

## 2023-04-26 MED ORDER — SODIUM CHLORIDE 0.9 % IV SOLN: INTRAVENOUS | Status: AC

## 2023-04-26 MED ORDER — ALBUMIN HUMAN 5 % IV SOLN
INTRAVENOUS | Status: DC | PRN
Start: 2023-04-26 — End: 2023-04-26

## 2023-04-26 MED ORDER — PANTOPRAZOLE SODIUM 40 MG PO TBEC
40.0000 mg | DELAYED_RELEASE_TABLET | Freq: Every day | ORAL | Status: DC
  Administered 2023-04-26 – 2023-04-27 (×2): 40 mg via ORAL
  Filled 2023-04-26 (×2): qty 1

## 2023-04-26 MED ORDER — METOCLOPRAMIDE HCL 5 MG/ML IJ SOLN: 5.0000 mg | Freq: Three times a day (TID) | INTRAMUSCULAR | Status: DC | PRN

## 2023-04-26 MED ORDER — ONDANSETRON HCL 4 MG PO TABS: 4.0000 mg | ORAL_TABLET | Freq: Four times a day (QID) | ORAL | Status: DC | PRN

## 2023-04-26 MED ORDER — ORAL CARE MOUTH RINSE: 15.0000 mL | Freq: Once | OROMUCOSAL | Status: AC

## 2023-04-26 MED ORDER — EPHEDRINE 5 MG/ML INJ
INTRAVENOUS | Status: AC
  Filled 2023-04-26: qty 5

## 2023-04-26 MED ORDER — HYDROMORPHONE HCL 1 MG/ML IJ SOLN: 0.5000 mg | INTRAMUSCULAR | Status: DC | PRN

## 2023-04-26 MED ORDER — PHENOL 1.4 % MT LIQD: 1.0000 | OROMUCOSAL | Status: DC | PRN

## 2023-04-26 MED ORDER — VERAPAMIL HCL ER 240 MG PO TBCR
240.0000 mg | EXTENDED_RELEASE_TABLET | Freq: Every day | ORAL | Status: DC
  Administered 2023-04-26: 240 mg via ORAL
  Filled 2023-04-26 (×2): qty 1

## 2023-04-26 MED ORDER — DEXAMETHASONE SODIUM PHOSPHATE 10 MG/ML IJ SOLN
INTRAMUSCULAR | Status: AC
  Filled 2023-04-26: qty 1

## 2023-04-26 MED ORDER — FENTANYL CITRATE (PF) 250 MCG/5ML IJ SOLN
INTRAMUSCULAR | Status: AC
  Filled 2023-04-26: qty 5

## 2023-04-26 MED ORDER — LIDOCAINE 2% (20 MG/ML) 5 ML SYRINGE
INTRAMUSCULAR | Status: DC | PRN
  Administered 2023-04-26 (×4): 80 mg via INTRAVENOUS

## 2023-04-26 MED ORDER — LIDOCAINE 2% (20 MG/ML) 5 ML SYRINGE
INTRAMUSCULAR | Status: AC
  Filled 2023-04-26: qty 5

## 2023-04-26 MED ORDER — CEFAZOLIN SODIUM-DEXTROSE 2-4 GM/100ML-% IV SOLN
2.0000 g | Freq: Three times a day (TID) | INTRAVENOUS | Status: AC
  Administered 2023-04-26 – 2023-04-27 (×3): 2 g via INTRAVENOUS
  Filled 2023-04-26 (×3): qty 100

## 2023-04-26 MED ORDER — PHENYLEPHRINE HCL (PRESSORS) 10 MG/ML IV SOLN
INTRAVENOUS | Status: DC | PRN
Start: 2023-04-26 — End: 2023-04-26
  Administered 2023-04-26 (×2): 80 ug via INTRAVENOUS

## 2023-04-26 MED ORDER — POVIDONE-IODINE 7.5 % EX SOLN: Freq: Once | CUTANEOUS | Status: DC

## 2023-04-26 MED ORDER — FENTANYL CITRATE (PF) 100 MCG/2ML IJ SOLN: 25.0000 ug | INTRAMUSCULAR | Status: DC | PRN

## 2023-04-26 MED ORDER — HYDROCHLOROTHIAZIDE 25 MG PO TABS
25.0000 mg | ORAL_TABLET | Freq: Every day | ORAL | Status: DC
  Administered 2023-04-27: 25 mg via ORAL
  Filled 2023-04-26: qty 1

## 2023-04-26 MED ORDER — ROCURONIUM BROMIDE 10 MG/ML (PF) SYRINGE
PREFILLED_SYRINGE | INTRAVENOUS | Status: AC
  Filled 2023-04-26: qty 10

## 2023-04-26 MED ORDER — OXYCODONE HCL 5 MG PO TABS: 5.0000 mg | ORAL_TABLET | ORAL | Status: DC | PRN

## 2023-04-26 MED ORDER — VANCOMYCIN HCL 1000 MG IV SOLR
INTRAVENOUS | Status: AC
  Filled 2023-04-26: qty 20

## 2023-04-26 MED ORDER — LACTATED RINGERS IV SOLN: INTRAVENOUS | Status: DC

## 2023-04-26 MED ORDER — CHLORHEXIDINE GLUCONATE 0.12 % MT SOLN
15.0000 mL | Freq: Once | OROMUCOSAL | Status: AC
  Administered 2023-04-26: 15 mL via OROMUCOSAL
  Filled 2023-04-26: qty 15

## 2023-04-26 MED ORDER — ACETAMINOPHEN 500 MG PO TABS
1000.0000 mg | ORAL_TABLET | Freq: Four times a day (QID) | ORAL | Status: AC
  Administered 2023-04-26 – 2023-04-27 (×3): 1000 mg via ORAL
  Filled 2023-04-26 (×3): qty 2

## 2023-04-26 MED ORDER — ALBUTEROL SULFATE HFA 108 (90 BASE) MCG/ACT IN AERS: 2.0000 | INHALATION_SPRAY | Freq: Four times a day (QID) | RESPIRATORY_TRACT | Status: DC | PRN

## 2023-04-26 MED ORDER — ALBUTEROL SULFATE (2.5 MG/3ML) 0.083% IN NEBU: 2.5000 mg | INHALATION_SOLUTION | Freq: Four times a day (QID) | RESPIRATORY_TRACT | Status: DC | PRN

## 2023-04-26 MED ORDER — DROPERIDOL 2.5 MG/ML IJ SOLN: 0.6250 mg | Freq: Once | INTRAMUSCULAR | Status: DC | PRN

## 2023-04-26 MED ORDER — ONDANSETRON HCL 4 MG/2ML IJ SOLN
INTRAMUSCULAR | Status: AC
  Filled 2023-04-26: qty 2

## 2023-04-26 MED ORDER — FAMOTIDINE 20 MG PO TABS: 20.0000 mg | ORAL_TABLET | Freq: Every day | ORAL | Status: DC | PRN

## 2023-04-26 MED ORDER — FENTANYL CITRATE (PF) 250 MCG/5ML IJ SOLN
INTRAMUSCULAR | Status: DC | PRN
  Administered 2023-04-26 (×2): 50 ug via INTRAVENOUS

## 2023-04-26 MED ORDER — ONDANSETRON HCL 4 MG/2ML IJ SOLN
INTRAMUSCULAR | Status: DC | PRN
  Administered 2023-04-26: 4 mg via INTRAVENOUS

## 2023-04-26 MED ORDER — TRANEXAMIC ACID-NACL 1000-0.7 MG/100ML-% IV SOLN
1000.0000 mg | INTRAVENOUS | Status: DC
  Filled 2023-04-26: qty 100

## 2023-04-26 SURGICAL SUPPLY — 75 items
AID PSTN UNV HD RSTRNT DISP (MISCELLANEOUS) ×2
ALCOHOL 70% 16 OZ (MISCELLANEOUS) ×2 IMPLANT
APL PRP STRL LF DISP 70% ISPRP (MISCELLANEOUS) ×2
AUG COMP REV MI TAPER ADAPTER (Joint) ×2 IMPLANT
AUGMENT COMP REV MI TAPR ADPTR (Joint) IMPLANT
BAG COUNTER SPONGE SURGICOUNT (BAG) ×2 IMPLANT
BAG SPNG CNTER NS LX DISP (BAG) ×2
BIT DRILL 2.7 W/STOP DISP (BIT) IMPLANT
BIT DRILL QUICK REL 1/8 2PK SL (BIT) IMPLANT
BIT DRILL TWIST 2.7 (BIT) IMPLANT
BLADE SAW SGTL 13X75X1.27 (BLADE) ×2 IMPLANT
BRNG HUM +3 36 RVRS SHLDR (Shoulder) ×2 IMPLANT
BSPLAT GLND SM AUG TPR ADPR (Joint) ×2 IMPLANT
CHLORAPREP W/TINT 26 (MISCELLANEOUS) ×2 IMPLANT
CLSR STERI-STRIP ANTIMIC 1/2X4 (GAUZE/BANDAGES/DRESSINGS) IMPLANT
COOLER ICEMAN CLASSIC (MISCELLANEOUS) ×2 IMPLANT
COVER SURGICAL LIGHT HANDLE (MISCELLANEOUS) ×2 IMPLANT
DRAPE INCISE IOBAN 66X45 STRL (DRAPES) ×2 IMPLANT
DRAPE U-SHAPE 47X51 STRL (DRAPES) ×4 IMPLANT
DRSG AQUACEL AG ADV 3.5X10 (GAUZE/BANDAGES/DRESSINGS) ×2 IMPLANT
ELECT BLADE 4.0 EZ CLEAN MEGAD (MISCELLANEOUS) ×2
ELECT REM PT RETURN 9FT ADLT (ELECTROSURGICAL) ×2
ELECTRODE BLDE 4.0 EZ CLN MEGD (MISCELLANEOUS) ×2 IMPLANT
ELECTRODE REM PT RTRN 9FT ADLT (ELECTROSURGICAL) ×2 IMPLANT
GAUZE SPONGE 4X4 12PLY STRL LF (GAUZE/BANDAGES/DRESSINGS) ×2 IMPLANT
GLENOID SPHERE 36+6 (Joint) IMPLANT
GLOVE BIOGEL PI IND STRL 6.5 (GLOVE) ×2 IMPLANT
GLOVE BIOGEL PI IND STRL 8 (GLOVE) ×2 IMPLANT
GLOVE ECLIPSE 6.5 STRL STRAW (GLOVE) ×2 IMPLANT
GLOVE ECLIPSE 8.0 STRL XLNG CF (GLOVE) ×2 IMPLANT
GOWN STRL REUS W/ TWL LRG LVL3 (GOWN DISPOSABLE) ×2 IMPLANT
GOWN STRL REUS W/ TWL XL LVL3 (GOWN DISPOSABLE) ×2 IMPLANT
GOWN STRL REUS W/TWL LRG LVL3 (GOWN DISPOSABLE) ×2
GOWN STRL REUS W/TWL XL LVL3 (GOWN DISPOSABLE) ×2
GUIDE BONE RSA SHLD ROT RT (ORTHOPEDIC DISPOSABLE SUPPLIES) IMPLANT
HYDROGEN PEROXIDE 16OZ (MISCELLANEOUS) ×2 IMPLANT
JET LAVAGE IRRISEPT WOUND (IRRIGATION / IRRIGATOR) ×2
KIT BASIN OR (CUSTOM PROCEDURE TRAY) ×2 IMPLANT
KIT TURNOVER KIT B (KITS) ×2 IMPLANT
LAVAGE JET IRRISEPT WOUND (IRRIGATION / IRRIGATOR) ×2 IMPLANT
MANIFOLD NEPTUNE II (INSTRUMENTS) ×2 IMPLANT
NDL SUT 6 .5 CRC .975X.05 MAYO (NEEDLE) IMPLANT
NEEDLE MAYO TAPER (NEEDLE)
NS IRRIG 1000ML POUR BTL (IV SOLUTION) ×2 IMPLANT
PACK SHOULDER (CUSTOM PROCEDURE TRAY) ×2 IMPLANT
PAD COLD SHLDR WRAP-ON (PAD) ×2 IMPLANT
PIN HUMERAL STMN 3.2MMX9IN (INSTRUMENTS) IMPLANT
PIN THREADED REVERSE (PIN) IMPLANT
REAMER GUIDE BUSHING SURG DISP (MISCELLANEOUS) IMPLANT
REAMER GUIDE W/SCREW AUG (MISCELLANEOUS) IMPLANT
RESTRAINT HEAD UNIVERSAL NS (MISCELLANEOUS) ×2 IMPLANT
RETRIEVER SUT HEWSON (MISCELLANEOUS) ×2 IMPLANT
SCREW BONE STRL 6.5MMX30MM (Screw) IMPLANT
SCREW LOCKING 4.75MMX15MM (Screw) IMPLANT
SCREW LOCKING STRL 4.75X25X3.5 (Screw) IMPLANT
SLING ARM IMMOBILIZER LRG (SOFTGOODS) ×2 IMPLANT
SOL PREP POV-IOD 4OZ 10% (MISCELLANEOUS) ×2 IMPLANT
SPONGE T-LAP 18X18 ~~LOC~~+RFID (SPONGE) ×2 IMPLANT
STEM HUMERAL STRL 13MMX83MM (Stem) IMPLANT
STRIP CLOSURE SKIN 1/2X4 (GAUZE/BANDAGES/DRESSINGS) ×2 IMPLANT
SUCTION TUBE FRAZIER 10FR DISP (SUCTIONS) ×2 IMPLANT
SUT BROADBAND TAPE 2PK 1.5 (SUTURE) IMPLANT
SUT MNCRL AB 3-0 PS2 18 (SUTURE) ×2 IMPLANT
SUT SILK 2 0 TIES 10X30 (SUTURE) ×2 IMPLANT
SUT VIC AB 0 CT1 27 (SUTURE) ×8
SUT VIC AB 0 CT1 27XBRD ANBCTR (SUTURE) ×8 IMPLANT
SUT VIC AB 1 CT1 27 (SUTURE) ×4
SUT VIC AB 1 CT1 27XBRD ANBCTR (SUTURE) ×4 IMPLANT
SUT VIC AB 2-0 CT1 27 (SUTURE) ×6
SUT VIC AB 2-0 CT1 TAPERPNT 27 (SUTURE) ×6 IMPLANT
SUT VICRYL 0 UR6 27IN ABS (SUTURE) ×4 IMPLANT
TOWEL GREEN STERILE (TOWEL DISPOSABLE) ×2 IMPLANT
TRAY HUM REV SHOULDER 36 +3 (Shoulder) IMPLANT
TRAY HUM REV SHOULDER STD +6 (Shoulder) IMPLANT
WATER STERILE IRR 1000ML POUR (IV SOLUTION) ×2 IMPLANT

## 2023-04-26 NOTE — H&P (Signed)
Cassidy Garcia is an 77 y.o. female.   Chief Complaint: Right shoulder pain HPI: Cassidy Garcia is a 77 y.o. female who presents reporting continued right shoulder pain.  Since she was last seen she had an MRI scan.  The MRI scan demonstrates significant rotator cuff arthropathy.  Patient states she really cannot forward flex or AB duct that right arm beyond 90 degrees.  Pain is a bigger problem than weakness.  The patient states that her shoulder pain wakes her from night about every 2 hours on most nights.    Has had an intra-articular injection more than 3 months ago which did not give her sustained relief.  She is getting epidural steroid injections in her back as well.  Otherwise she is reasonably healthy.  Does live alone but has sisters who could come stay with her..   Past Medical History:  Diagnosis Date   Arthritis    Asthma    Bronchitis 03/2016   Chronic kidney disease    stage 3   Colon polyps    tubular adenoma   Constipation    Depression    pt denies   Diverticulosis    GERD (gastroesophageal reflux disease)    Hemorrhoids    History of hiatal hernia    Hyperlipidemia    Hypertension    Hypothyroidism    Shortness of breath    with exertion on occasion   Sinusitis    Skin cancer     Past Surgical History:  Procedure Laterality Date   ABDOMINAL HYSTERECTOMY     CARPOMETACARPAL (CMC) FUSION OF THUMB Bilateral    CATARACT EXTRACTION, BILATERAL     CHOLECYSTECTOMY     COLONOSCOPY  02/12/2011   HAMMER TOE SURGERY Right    LAPAROSCOPIC APPENDECTOMY N/A 08/26/2021   Procedure: APPENDECTOMY LAPAROSCOPIC WITH LYSIS OF ADHESIONS;  Surgeon: Karie Soda, MD;  Location: [facility];  Service: General;  Laterality: N/A;   NASAL SINUS SURGERY     POLYPECTOMY     TOTAL HIP ARTHROPLASTY  12/10/2011   Procedure: TOTAL HIP ARTHROPLASTY ANTERIOR APPROACH;  Surgeon: Kathryne Hitch, MD;  Location: [facility];  Service: Orthopedics;  Laterality: Right;  Right Total Hip Arthroplasty,  Anterior Approach    Family History  Problem Relation Age of Onset   Ovarian cancer Mother    Anxiety disorder Mother    Heart disease Paternal Grandmother    Other Paternal Aunt        brain tumor   Birth defects Sister        no iris   Depression Sister    Anxiety disorder Sister    Colon cancer Neg Hx    Social History:  reports that she quit smoking about 21 years ago. Her smoking use included cigarettes. She has never used smokeless tobacco. She reports that she does not currently use alcohol. She reports that she does not use drugs.  Allergies:  Allergies  Allergen Reactions   Codeine Nausea And Vomiting    REACTION: Lightheaded, nauses Was a high dose   Demerol [Meperidine Hcl] Other (See Comments)    Dizziness    Phenergan [Promethazine] Other (See Comments)    Feeling like heart attack    Medications Prior to Admission  Medication Sig Dispense Refill   acetaminophen-codeine (TYLENOL #3) 300-30 MG tablet Take 1 tablet by mouth every 8 (eight) hours as needed for moderate pain (Patient taking differently: Take 1 tablet by mouth daily as needed for moderate pain.) 30 tablet  0   amitriptyline (ELAVIL) 25 MG tablet Take 75 mg by mouth at bedtime.      aspirin 81 MG tablet Take 81 mg by mouth daily with breakfast.     atorvastatin (LIPITOR) 40 MG tablet Take 40 mg by mouth daily.     Cholecalciferol (VITAMIN D3) 25 MCG (1000 UT) CAPS Take 1,000 Units by mouth daily.     famotidine (PEPCID) 20 MG tablet Take 20 mg by mouth daily as needed for heartburn or indigestion.     hydrocortisone (ANUSOL-HC) 2.5 % rectal cream Place 1 application. rectally 2 (two) times daily. (Patient taking differently: Place 1 application  rectally 2 (two) times daily as needed for hemorrhoids or anal itching.) 30 g 1   levothyroxine (SYNTHROID) 25 MCG tablet Take 1 tablet (25 mcg total) by mouth daily before breakfast. 30 tablet 0   lidocaine (LIDODERM) 5 % Place 1 patch onto the skin daily.  Remove & Discard patch within 12 hours or as directed by MD     losartan-hydrochlorothiazide (HYZAAR) 100-25 MG tablet Take 1 tablet by mouth daily.     Magnesium 200 MG CHEW Chew 200 mg by mouth daily.     Multiple Vitamin (MULTIVITAMIN) tablet Take 1 tablet by mouth daily.     omeprazole (PRILOSEC) 20 MG capsule Take 20 mg by mouth daily with breakfast.     Psyllium (METAMUCIL) 28.3 % POWD      verapamil (CALAN-SR) 240 MG CR tablet Take 240 mg by mouth at bedtime.     albuterol (VENTOLIN HFA) 108 (90 Base) MCG/ACT inhaler Inhale 2 puffs into the lungs every 6 (six) hours as needed for wheezing or shortness of breath.     HYDROcodone-acetaminophen (NORCO/VICODIN) 5-325 MG tablet Take 1 tablet by mouth every 6 (six) hours as needed. (Patient not taking: Reported on 04/11/2023) 20 tablet 0   oxymetazoline (AFRIN) 0.05 % nasal spray Place 1 spray into both nostrils daily as needed for congestion.      No results found for this or any previous visit (from the past 48 hour(s)). No results found.  Review of Systems  Musculoskeletal:  Positive for arthralgias.  All other systems reviewed and are negative.   Blood pressure 123/65, pulse 82, temperature 97.8 F (36.6 C), temperature source Oral, resp. rate 18, height 5\' 5"  (1.651 m), weight 93 kg, SpO2 94%. Physical Exam Vitals reviewed.  HENT:     Head: Normocephalic.     Nose: Nose normal.     Mouth/Throat:     Mouth: Mucous membranes are moist.  Cardiovascular:     Rate and Rhythm: Normal rate.     Pulses: Normal pulses.  Pulmonary:     Effort: Pulmonary effort is normal.  Abdominal:     General: Abdomen is flat.  Musculoskeletal:     Cervical back: Normal range of motion.  Skin:    General: Skin is warm.     Capillary Refill: Capillary refill takes less than 2 seconds.  Neurological:     General: No focal deficit present.     Mental Status: She is alert.  Psychiatric:        Mood and Affect: Mood normal.    Ortho exam  demonstrates active forward flexion and abduction to about 70 degrees. Does have diminished rotator cuff strength infraspinatus and subscap testing on the right compared to the left. Passively her shoulder motion is about 75/110/170. Deltoid is functional.  Bicipital groove tenderness is present.  No Popeye deformity.  O'Brien's testing positive.  Assessment/Plan Impression is right shoulder rotator cuff arthropathy. MRI scan shows fatty atrophy of all the shoulder musculature with large full-thickness retracted rotator cuff tears of the supraspinatus infraspinatus and subscapularis. Biceps tendon remains intact. thin cut CT scan for preoperative planning for right shoulder reverse shoulder replacement has been performed..  Plan for reverse shoulder replacement and biceps tenodesis.  The risk and benefits are discussed with the patient include not limited to infection or vessel damage instability as well as potential for further surgery. Expected rehab time also discussed. Patient understands the risk and benefits and wishes to proceed.  All questions answered. Burnard Bunting, MD 04/26/2023, 6:37 AM

## 2023-04-26 NOTE — Anesthesia Postprocedure Evaluation (Signed)
Anesthesia Post Note  Patient: Cassidy Garcia  Procedure(s) Performed: RIGHT REVERSE SHOULDER ARTHROPLASTY (Right: Shoulder) RIGHT BICEPS TENODESIS (Right)     Patient location during evaluation: PACU Anesthesia Type: General Level of consciousness: sedated and patient cooperative Pain management: pain level controlled Vital Signs Assessment: post-procedure vital signs reviewed and stable Respiratory status: spontaneous breathing Cardiovascular status: stable Anesthetic complications: no   No notable events documented.  Last Vitals:  Vitals:   04/26/23 1130 04/26/23 1208  BP: (!) 112/55 109/67  Pulse: 60 (!) 55  Resp: 17 17  Temp:  36.4 C  SpO2: 94% 90%    Last Pain:  Vitals:   04/26/23 1208  TempSrc: Oral  PainSc: 0-No pain                 Lewie Loron

## 2023-04-26 NOTE — Transfer of Care (Signed)
Immediate Anesthesia Transfer of Care Note  Patient: Cassidy Garcia  Procedure(s) Performed: RIGHT REVERSE SHOULDER ARTHROPLASTY (Right: Shoulder) RIGHT BICEPS TENODESIS (Right)  Patient Location: PACU  Anesthesia Type:GA combined with regional for post-op pain  Level of Consciousness: awake, alert , and patient cooperative  Airway & Oxygen Therapy: Patient Spontanous Breathing and Patient connected to face mask oxygen  Post-op Assessment: Report given to RN and Post -op Vital signs reviewed and stable  Post vital signs: Reviewed and stable  Last Vitals:  Vitals Value Taken Time  BP 110/47 04/26/23 1052  Temp    Pulse 68 04/26/23 1053  Resp 18 04/26/23 1053  SpO2 100 % 04/26/23 1053  Vitals shown include unfiled device data.  Last Pain:  Vitals:   04/26/23 0602  TempSrc:   PainSc: 0-No pain      Patients Stated Pain Goal: 0 (04/26/23 0602)  Complications: No notable events documented.

## 2023-04-26 NOTE — Anesthesia Procedure Notes (Addendum)
Procedure Name: Intubation Date/Time: 04/26/2023 7:43 AM  Performed by: Shary Decamp, CRNAPre-anesthesia Checklist: Patient identified, Patient being monitored, Timeout performed, Emergency Drugs available and Suction available Patient Re-evaluated:Patient Re-evaluated prior to induction Oxygen Delivery Method: Circle System Utilized Preoxygenation: Pre-oxygenation with 100% oxygen Induction Type: IV induction Ventilation: Mask ventilation without difficulty Laryngoscope Size: Miller and 2 Grade View: Grade I Tube type: Oral Tube size: 7.0 mm Number of attempts: 1 Airway Equipment and Method: Stylet Placement Confirmation: ETT inserted through vocal cords under direct vision, positive ETCO2 and breath sounds checked- equal and bilateral Secured at: 21 cm Tube secured with: Tape Dental Injury: Teeth and Oropharynx as per pre-operative assessment

## 2023-04-26 NOTE — Evaluation (Signed)
Occupational Therapy Evaluation Patient Details Name: Cassidy Garcia MRN: [id number] DOB: 1945-10-01 Today's Date: 04/26/2023   History of Present Illness 77 y.o. female s/p right shoulder RTC arthroplasty on 04/26/23.   Clinical Impression   s/p shoulder replacement without functional use of RUE secondary to effects of surgery and interscalene block and shoulder precautions. Therapist provided education and instruction to patient in regards to exercises, precautions, positioning, donning upper extremity clothing and bathing while maintaining shoulder precautions, ice and edema management, use of ice machine and donning/doffing sling. Patient verbalized understanding. Pt currently with functional limitations due to the deficits listed below (see OT Problem List). Pt lives alone and at baseline is independent with all ADL, IADL tasks, and functional mobility including driving. Pt demonstrated and verbalized LE weakness and instability which has been ongoing for a few months. PT order in place already and communicated with PT regarding OT concerns. Pt will benefit from acute skilled OT to increase their safety and independence with ADL and functional mobility for ADL to facilitate discharge. Recommend seeing patient for one more treatment prior to discharge to review HEP and allow pt to demonstrate independence with directing care. Recommend pt follow up with MD regarding shoulder rehab at follow up appointment.         If plan is discharge home, recommend the following: A little help with walking and/or transfers;A lot of help with bathing/dressing/bathroom;Assistance with cooking/housework;Assist for transportation;Help with stairs or ramp for entrance    Functional Status Assessment  Patient has had a recent decline in their functional status and demonstrates the ability to make significant improvements in function in a reasonable and predictable amount of time.  Equipment Recommendations  None  recommended by OT    Recommendations for Other Services PT consult     Precautions / Restrictions Precautions Precautions: Shoulder Type of Shoulder Precautions: s/p R shoulder RTC athroplasty with shoulder restrictions. No A/ROM shoulder, OK A/ROM elbow, wrist, and hand, sling 24/7, WB restrictions Shoulder Interventions: Shoulder sling/immobilizer;At all times;Off for dressing/bathing/exercises Precaution Booklet Issued: Yes (comment) Precaution Comments: verbal education provided regarding shoulder restrictions/protocol. Pt verbalized understanding. Handout provided as reference. Restrictions Weight Bearing Restrictions: Yes RUE Weight Bearing: Non weight bearing      Mobility Bed Mobility Overal bed mobility: Needs Assistance Bed Mobility: Supine to Sit, Sit to Supine     Supine to sit: Min assist, HOB elevated, Used rails Sit to supine: Min assist   General bed mobility comments: VC for sequencing and using bed rails as needed. Exited bed on left side. HOB remained elevated as pt plans to sleep in recliner when home.    Transfers Overall transfer level: Needs assistance Equipment used: 1 person hand held assist Transfers: Bed to chair/wheelchair/BSC, Sit to/from Stand Sit to Stand: Min assist, From elevated surface     Step pivot transfers: Min assist     General transfer comment: VC for sequencing while navigating from bed to toilet with IV pole.      Balance Overall balance assessment: Needs assistance Sitting-balance support: No upper extremity supported, Feet supported Sitting balance-Leahy Scale: Fair Sitting balance - Comments: sitting EOB   Standing balance support: Single extremity supported, During functional activity Standing balance-Leahy Scale: Poor Standing balance comment: 1 person Hand held assist preferred when ambulating      ADL either performed or assessed with clinical judgement   ADL Overall ADL's : Needs  assistance/impaired Eating/Feeding: Set up;Bed level   Grooming: Supervision/safety;Standing;Wash/dry hands   Upper Body Bathing: Moderate  assistance;Sitting   Lower Body Bathing: Maximal assistance;Sit to/from stand   Upper Body Dressing : Total assistance;Sitting;Cueing for UE precautions;Cueing for compensatory techniques;Cueing for sequencing   Lower Body Dressing: Total assistance;Bed level   Toilet Transfer: Minimal assistance;Ambulation;Regular Toilet;Grab bars Toilet Transfer Details (indicate cue type and reason): hand held assist provided Toileting- Clothing Manipulation and Hygiene: Supervision/safety;Sitting/lateral lean Toileting - Clothing Manipulation Details (indicate cue type and reason): utilized left hand to wipe             Vision Baseline Vision/History: 1 Wears glasses Ability to See in Adequate Light: 0 Adequate Patient Visual Report: No change from baseline Vision Assessment?: No apparent visual deficits     Perception Perception: Not tested       Praxis Praxis: Not tested       Pertinent Vitals/Pain Pain Assessment Pain Assessment: 0-10 (pt reports nerve block is still in effect) Pain Score: 0-No pain     Extremity/Trunk Assessment Upper Extremity Assessment Upper Extremity Assessment: Right hand dominant;RUE deficits/detail RUE Deficits / Details: post op R shoulder RTC arthroplasty with shoulder restrictions in place. Pt able to demonstrate A/ROM of the hand during evaluation. Reports that nerve block is still active and UE is numb. RUE: Unable to fully assess due to immobilization   Lower Extremity Assessment Lower Extremity Assessment: Generalized weakness   Cervical / Trunk Assessment Cervical / Trunk Assessment: Other exceptions Cervical / Trunk Exceptions: body habitus, forward head, rounded shoulders   Communication Communication Communication: No apparent difficulties   Cognition Arousal: Alert Behavior During Therapy: WFL  for tasks assessed/performed Overall Cognitive Status: Within Functional Limits for tasks assessed        General Comments  SpO2 98% on RA when O2 removed. After using bathroom and returning to bed, pt attempted to clear lungs via cough. Unable to catch her breathe. SpO2 87% on RA and O2 was replaced with SpO2 increasing to 96-97%. Nursing made aware of pt's request for breathing treatment if possible.            Home Living Family/patient expects to be discharged to:: Private residence Living Arrangements: Alone (Billy her cat) Available Help at Discharge: Friend(s);Family (Sister) Type of Home: House Home Access: Stairs to enter Secretary/administrator of Steps: 3 Entrance Stairs-Rails: Left Home Layout: Two level;Able to live on main level with bedroom/bathroom     Bathroom Shower/Tub: Producer, television/film/video: Standard     Home Equipment: Cane - single point;Hand held shower head   Additional Comments: Did not use the cane.      Prior Functioning/Environment Prior Level of Function : Independent/Modified Independent;Driving             OT Problem List: Impaired UE functional use      OT Treatment/Interventions: Self-care/ADL training;Therapeutic exercise;Therapeutic activities;Patient/family education;Manual therapy    OT Goals(Current goals can be found in the care plan section) Acute Rehab OT Goals Patient Stated Goal: to use the bathroom OT Goal Formulation: Patient unable to participate in goal setting Time For Goal Achievement: 05/10/23 Potential to Achieve Goals: Good  OT Frequency: Min 2X/week       AM-PAC OT "6 Clicks" Daily Activity     Outcome Measure Help from another person eating meals?: A Little Help from another person taking care of personal grooming?: A Little Help from another person toileting, which includes using toliet, bedpan, or urinal?: A Little Help from another person bathing (including washing, rinsing, drying)?: A  Lot Help from another person to  put on and taking off regular upper body clothing?: Total Help from another person to put on and taking off regular lower body clothing?: Total 6 Click Score: 13   End of Session Equipment Utilized During Treatment: Gait belt;Oxygen;Other (comment) (sling) Nurse Communication: Mobility status;Other (comment) (IV beeping, SpO2 levels and request for breathing treatment)  Activity Tolerance: Patient tolerated treatment well Patient left: in bed;with call bell/phone within reach  OT Visit Diagnosis: Muscle weakness (generalized) (M62.81);Pain Pain - Right/Left: Right Pain - part of body: Shoulder                Time: 2956-2130 OT Time Calculation (min): 57 min Charges:  OT General Charges $OT Visit: 1 Visit OT Evaluation $OT Eval High Complexity: 1 High OT Treatments $Self Care/Home Management : 8-22 mins $Therapeutic Activity: 8-22 mins $Therapeutic Exercise: 8-22 mins  Limmie Patricia, OTR/L,CBIS  Supplemental OT - [facility] and [facility] Secure Chat Preferred    Norinne Jeane, Charisse March 04/26/2023, 4:03 PM

## 2023-04-26 NOTE — Brief Op Note (Signed)
   04/26/2023  10:33 AM  PATIENT:  Cassidy Garcia  77 y.o. female  PRE-OPERATIVE DIAGNOSIS:  right rotator cuff arthropathy, biceps tendinitis  POST-OPERATIVE DIAGNOSIS:  same  PROCEDURE:  Procedure(s): RIGHT REVERSE SHOULDER ARTHROPLASTY RIGHT BICEPS TENODESIS  SURGEON:  Surgeon(s): August Saucer, Corrie Mckusick, MD  ASSISTANT: magnant pa  ANESTHESIA:   general  EBL: 75 ml    Total I/O In: 1950 [I.V.:1600; IV Piggyback:350] Out: -   BLOOD ADMINISTERED: none  DRAINS: none   LOCAL MEDICATIONS USED:  vanco powder  SPECIMEN:  No Specimen  COUNTS:  YES  TOURNIQUET:  * No tourniquets in log *  DICTATION: .Other Dictation: Dictation Number [id number]  PLAN OF CARE: Admit for overnight observation  PATIENT DISPOSITION:  PACU - hemodynamically stable

## 2023-04-27 ENCOUNTER — Encounter (HOSPITAL_COMMUNITY): Payer: Self-pay | Admitting: Orthopedic Surgery

## 2023-04-27 ENCOUNTER — Observation Stay (HOSPITAL_COMMUNITY): Payer: Medicare Other

## 2023-04-27 DIAGNOSIS — M7521 Bicipital tendinitis, right shoulder: Secondary | ICD-10-CM | POA: Diagnosis not present

## 2023-04-27 DIAGNOSIS — J9811 Atelectasis: Secondary | ICD-10-CM | POA: Diagnosis not present

## 2023-04-27 DIAGNOSIS — M75101 Unspecified rotator cuff tear or rupture of right shoulder, not specified as traumatic: Secondary | ICD-10-CM | POA: Diagnosis not present

## 2023-04-27 DIAGNOSIS — R918 Other nonspecific abnormal finding of lung field: Secondary | ICD-10-CM | POA: Diagnosis not present

## 2023-04-27 DIAGNOSIS — Z87891 Personal history of nicotine dependence: Secondary | ICD-10-CM | POA: Diagnosis not present

## 2023-04-27 DIAGNOSIS — J45909 Unspecified asthma, uncomplicated: Secondary | ICD-10-CM | POA: Diagnosis not present

## 2023-04-27 DIAGNOSIS — Z85828 Personal history of other malignant neoplasm of skin: Secondary | ICD-10-CM | POA: Diagnosis not present

## 2023-04-27 DIAGNOSIS — E039 Hypothyroidism, unspecified: Secondary | ICD-10-CM | POA: Diagnosis not present

## 2023-04-27 DIAGNOSIS — Z79899 Other long term (current) drug therapy: Secondary | ICD-10-CM | POA: Diagnosis not present

## 2023-04-27 DIAGNOSIS — Z7982 Long term (current) use of aspirin: Secondary | ICD-10-CM | POA: Diagnosis not present

## 2023-04-27 DIAGNOSIS — N183 Chronic kidney disease, stage 3 unspecified: Secondary | ICD-10-CM | POA: Diagnosis not present

## 2023-04-27 DIAGNOSIS — Z96611 Presence of right artificial shoulder joint: Secondary | ICD-10-CM | POA: Diagnosis not present

## 2023-04-27 DIAGNOSIS — Z96641 Presence of right artificial hip joint: Secondary | ICD-10-CM | POA: Diagnosis not present

## 2023-04-27 DIAGNOSIS — I129 Hypertensive chronic kidney disease with stage 1 through stage 4 chronic kidney disease, or unspecified chronic kidney disease: Secondary | ICD-10-CM | POA: Diagnosis not present

## 2023-04-27 DIAGNOSIS — Z471 Aftercare following joint replacement surgery: Secondary | ICD-10-CM | POA: Diagnosis not present

## 2023-04-27 MED ORDER — METHOCARBAMOL 500 MG PO TABS: 500.0000 mg | ORAL_TABLET | Freq: Three times a day (TID) | ORAL | 0 refills | Status: DC | PRN

## 2023-04-27 MED ORDER — DOCUSATE SODIUM 100 MG PO CAPS: 100.0000 mg | ORAL_CAPSULE | Freq: Two times a day (BID) | ORAL | 0 refills | Status: AC

## 2023-04-27 MED ORDER — OXYCODONE HCL 5 MG PO TABS: 5.0000 mg | ORAL_TABLET | ORAL | 0 refills | Status: DC | PRN

## 2023-04-27 NOTE — Evaluation (Signed)
Physical Therapy Evaluation Patient Details Name: Cassidy Garcia MRN: [id number] DOB: 17-May-1946 Today's Date: 04/27/2023  History of Present Illness  77 y.o. female s/p right shoulder RTC arthroplasty on 04/26/23.  Clinical Impression  Pt presents with admitting diagnosis above. Session today focused on use of SPC which pt tolerated well. Pt remained between 87-93% on RA. Cues for PLB once below 90%. Pt does have 3 steps to enter home and will need to practice that tomorrow. Recommend OPPT once cleared by MD.      If plan is discharge home, recommend the following: A little help with walking and/or transfers;A little help with bathing/dressing/bathroom;Assistance with cooking/housework;Assist for transportation;Help with stairs or ramp for entrance   Can travel by private vehicle        Equipment Recommendations None recommended by PT  Recommendations for Other Services       Functional Status Assessment Patient has had a recent decline in their functional status and demonstrates the ability to make significant improvements in function in a reasonable and predictable amount of time.     Precautions / Restrictions Precautions Precautions: Shoulder Type of Shoulder Precautions: s/p R shoulder RTC athroplasty with shoulder restrictions. No A/ROM shoulder, OK A/ROM elbow, wrist, and hand, sling 24/7, WB restrictions Shoulder Interventions: Shoulder sling/immobilizer;At all times;Off for dressing/bathing/exercises Precaution Booklet Issued: No Precaution Comments: Pt provided with education and handout regarding shoulder restrictions/precautions by OT at evaluation Required Braces or Orthoses: Sling Restrictions Weight Bearing Restrictions: Yes RUE Weight Bearing: Non weight bearing      Mobility  Bed Mobility Overal bed mobility: Needs Assistance Bed Mobility: Supine to Sit     Supine to sit: Supervision, HOB elevated, Used rails          Transfers Overall transfer  level: Needs assistance Equipment used: None Transfers: Sit to/from Stand, Bed to chair/wheelchair/BSC Sit to Stand: Supervision           General transfer comment: no apparent LOB.    Ambulation/Gait Ambulation/Gait assistance: Contact guard assist Gait Distance (Feet): 50 Feet Assistive device: Straight cane Gait Pattern/deviations: Decreased stride length, Step-through pattern, Wide base of support, Drifts right/left Gait velocity: decreased     General Gait Details: cues for sequencing with SPC. Pt with slow cautious gait.  Stairs            Wheelchair Mobility     Tilt Bed    Modified Rankin (Stroke Patients Only)       Balance Overall balance assessment: Needs assistance Sitting-balance support: No upper extremity supported, Feet supported Sitting balance-Leahy Scale: Fair Sitting balance - Comments: sitting EOB                                     Pertinent Vitals/Pain Pain Assessment Pain Assessment: No/denies pain    Home Living Family/patient expects to be discharged to:: Private residence Living Arrangements: Alone Available Help at Discharge: Friend(s);Family;Available PRN/intermittently Type of Home: House Home Access: Stairs to enter Entrance Stairs-Rails: Left Entrance Stairs-Number of Steps: 3 Alternate Level Stairs-Number of Steps: flight Home Layout: Two level;Able to live on main level with bedroom/bathroom Home Equipment: Cane - single point;Hand held shower head Additional Comments: Did not use the cane.    Prior Function Prior Level of Function : Independent/Modified Independent;Driving             Mobility Comments: Ind no AD ADLs Comments: Ind     Extremity/Trunk  Assessment   Upper Extremity Assessment Upper Extremity Assessment: RUE deficits/detail RUE Deficits / Details: post op R shoulder RTC arthroplasty with shoulder restrictions in place. Pt able to demonstrate A/ROM of the hand during  evaluation. Reports that nerve block is still active and UE is numb.    Lower Extremity Assessment Lower Extremity Assessment: Overall WFL for tasks assessed    Cervical / Trunk Assessment Cervical / Trunk Assessment: Other exceptions Cervical / Trunk Exceptions: body habitus, forward head, rounded shoulders  Communication   Communication Communication: No apparent difficulties  Cognition Arousal: Alert Behavior During Therapy: WFL for tasks assessed/performed Overall Cognitive Status: Within Functional Limits for tasks assessed                                          General Comments General comments (skin integrity, edema, etc.): Pt remained between 87-93% on RA. Cues for PLB once below 90%.    Exercises     Assessment/Plan    PT Assessment Patient needs continued PT services  PT Problem List Decreased strength;Decreased range of motion;Decreased activity tolerance;Decreased balance;Decreased mobility;Decreased coordination;Decreased cognition;Decreased knowledge of use of DME;Decreased safety awareness;Decreased knowledge of precautions       PT Treatment Interventions DME instruction;Gait training;Stair training;Functional mobility training;Therapeutic activities;Therapeutic exercise;Balance training;Neuromuscular re-education;Patient/family education    PT Goals (Current goals can be found in the Care Plan section)  Acute Rehab PT Goals Patient Stated Goal: to go home PT Goal Formulation: With patient Time For Goal Achievement: 05/11/23 Potential to Achieve Goals: Good    Frequency Min 1X/week     Co-evaluation               AM-PAC PT "6 Clicks" Mobility  Outcome Measure Help needed turning from your back to your side while in a flat bed without using bedrails?: A Little Help needed moving from lying on your back to sitting on the side of a flat bed without using bedrails?: A Little Help needed moving to and from a bed to a chair  (including a wheelchair)?: A Little Help needed standing up from a chair using your arms (e.g., wheelchair or bedside chair)?: A Little Help needed to walk in hospital room?: A Little Help needed climbing 3-5 steps with a railing? : A Lot 6 Click Score: 17    End of Session Equipment Utilized During Treatment: Gait belt Activity Tolerance: Patient tolerated treatment well Patient left: in bed;with call bell/phone within reach Nurse Communication: Mobility status PT Visit Diagnosis: Other abnormalities of gait and mobility (R26.89)    Time: 1610-9604 PT Time Calculation (min) (ACUTE ONLY): 25 min   Charges:   PT Evaluation $PT Eval Moderate Complexity: 1 Mod PT Treatments $Gait Training: 8-22 mins PT General Charges $$ ACUTE PT VISIT: 1 Visit         Shela Nevin, PT, DPT Acute Rehab Services [phone]   Gladys Damme 04/27/2023, 3:34 PM

## 2023-04-27 NOTE — Progress Notes (Signed)
Occupational Therapy Treatment and Discharge Patient Details Name: Cassidy Garcia MRN: [id number] DOB: [birth year] Today's Date: 04/27/2023   History of present illness 77 y.o. female s/p right shoulder RTC arthroplasty on 04/26/23.   OT comments  Pt agreeable to participate in OT treatment session. Pt transferred from bed to recliner during session. OT focused on UB dressing while pt donned/doffed UE sling with more difficulty noted in donning. Pt completed RUE HEP for elbow, wrist, and hand. OT provided VC for form and technique as needed during sling management and HEP. All acute OT goals have been met this date. OT will sign off.       If plan is discharge home, recommend the following:  A little help with walking and/or transfers;A lot of help with bathing/dressing/bathroom;Assistance with cooking/housework;Assist for transportation;Help with stairs or ramp for entrance   Equipment Recommendations  None recommended by OT       Precautions / Restrictions Precautions Precautions: Shoulder Type of Shoulder Precautions: s/p R shoulder RTC athroplasty with shoulder restrictions. No A/ROM shoulder, OK A/ROM elbow, wrist, and hand, sling 24/7, WB restrictions Shoulder Interventions: Shoulder sling/immobilizer;At all times;Off for dressing/bathing/exercises Precaution Booklet Issued: No Precaution Comments: Pt provided with education and handout regarding shoulder restrictions/precautions by OT at evaluation Required Braces or Orthoses: Sling Restrictions Weight Bearing Restrictions: Yes RUE Weight Bearing: Non weight bearing       Mobility Bed Mobility Overal bed mobility: Needs Assistance Bed Mobility: Supine to Sit     Supine to sit: Supervision, HOB elevated, Used rails          Transfers Overall transfer level: Needs assistance Equipment used: None Transfers: Sit to/from Stand, Bed to chair/wheelchair/BSC Sit to Stand: Supervision     Step pivot transfers:  Supervision     General transfer comment: No hands on assist required for step pivot from bed to recliner.     Balance Overall balance assessment: Needs assistance Sitting-balance support: No upper extremity supported, Feet supported Sitting balance-Leahy Scale: Fair Sitting balance - Comments: sitting EOB          ADL either performed or assessed with clinical judgement   ADL Overall ADL's : Needs assistance/impaired      Upper Body Dressing : Cueing for sequencing;Adhering to UE precautions;Sitting;Set up Upper Body Dressing Details (indicate cue type and reason): While seated in recliner, pt practiced doffing and donning her UE sling. OT provided VC for technique. Pt able to doff with no assist. set-up with VC provided for technique in order to don sling.             Cognition Arousal: Alert Behavior During Therapy: WFL for tasks assessed/performed Overall Cognitive Status: Within Functional Limits for tasks assessed           Exercises Shoulder Exercises Elbow Flexion: PROM, Right, 10 reps, Seated (self-ROM) Elbow Extension: PROM, Right, 10 reps, Seated (Self- ROM) Wrist Flexion: AROM, Right, 10 reps, Seated Wrist Extension: AROM, Right, 10 reps, Seated Digit Composite Flexion: AROM, Right, 10 reps, Seated Composite Extension: AROM, Right, 10 reps, Seated Other Exercises Other Exercises: seated, A/ROM wrist supination/pronation, RUE, 1 set, 10X.    Shoulder Instructions Shoulder Instructions Donning/doffing sling/immobilizer: Set-up;Patient able to independently direct caregiver Correct positioning of sling/immobilizer: Set-up;Patient able to independently direct caregiver ROM for elbow, wrist and digits of operated UE: Modified independent;Patient able to independently direct caregiver Sling wearing schedule (on at all times/off for ADL's): Independent;Patient able to independently direct caregiver Proper positioning of operated UE when showering:  Set-up;Patient able to independently direct caregiver          Pertinent Vitals/ Pain       Pain Assessment Pain Assessment: No/denies pain  Home Living Family/patient expects to be discharged to:: Private residence Living Arrangements: Alone Available Help at Discharge: Friend(s);Family;Available PRN/intermittently Type of Home: House Home Access: Stairs to enter [employer] of Steps: 3 Entrance Stairs-Rails: Left Home Layout: Two level;Able to live on main level with bedroom/bathroom Alternate Level Stairs-Number of Steps: flight Alternate Level Stairs-Rails: Left Bathroom Shower/Tub: Producer, television/film/video: Standard     Home Equipment: Cane - single point;Hand held shower head   Additional Comments: Did not use the cane.          Frequency  Min 2X/week        Progress Toward Goals  OT Goals(current goals can now be found in the care plan section)  Progress towards OT goals: Goals met/education completed, patient discharged from OT            AM-PAC OT "6 Clicks" Daily Activity     Outcome Measure   Help from another person eating meals?: A Little Help from another person taking care of personal grooming?: A Little Help from another person toileting, which includes using toliet, bedpan, or urinal?: A Little Help from another person bathing (including washing, rinsing, drying)?: A Lot Help from another person to put on and taking off regular upper body clothing?: A Little Help from another person to put on and taking off regular lower body clothing?: A Lot 6 Click Score: 16    End of Session Equipment Utilized During Treatment: Other (comment) (sling)  OT Visit Diagnosis: Muscle weakness (generalized) (M62.81);Pain Pain - Right/Left: Right Pain - part of body: Shoulder   Activity Tolerance Patient tolerated treatment well   Patient Left in chair;with call bell/phone within reach           Time: 1126-1153 OT Time  Calculation (min): 27 min  Charges: OT General Charges $OT Visit: 1 Visit OT Treatments $Self Care/Home Management : 8-22 mins $Therapeutic Exercise: 8-22 mins  Limmie Patricia, OTR/L,CBIS  Supplemental OT - [facility] and [facility] Secure Chat Preferred    Makeya Hilgert, Charisse March 04/27/2023, 1:36 PM

## 2023-04-27 NOTE — Op Note (Unsigned)
Cassidy Garcia, [place] MEDICAL RECORD NO: [id number] ACCOUNT NO: [id number] DATE OF BIRTH: 02-Nov-1945 FACILITY: [facility] LOCATION: [facility] PHYSICIAN: Graylin Shiver. 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  PROCEDURE:  Right reverse shoulder replacement utilizing small augmented glenoid baseplate with 1 central compression screw and 4 peripheral locking screws with 36+6 glenosphere and size 13 mini stem with +6 tapered offset tray and +3 polyethylene liner.  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  DESCRIPTION OF PROCEDURE:  The patient was brought to the operating room where general endotracheal anesthesia was induced.  Preoperative antibiotics administered.  Timeout was called.  The patient was placed in the beach chair position with the head in  neutral position.  Right shoulder, arm and hand prescrubbed with first hydrogen peroxide followed by alcohol and then Betadine, which was allowed to air dry, then ChloraPrep solution.  The shoulder was covered with Ioban and draped in a sterile manner.   Timeout was called.  Deltopectoral approach was made.  Cephalic vein mobilized laterally.  The interval between the deltoid and the pectoralis major tendon was developed.  Anterior portion of the deltoid was released manually to decrease some of the  tension on the deltoid.  The axillary nerve was palpated and protected at all times during the case.  Upper 1.5 to 2 cm of the pec was released.  Subdeltoid and subacromial adhesions were manually released.  Significant effusion was present in the   shoulder joint.  Axillary nerve was palpated and protected.  Circumflex vessels were ligated.  The subscapularis was intact and that was detached from the lesser tuberosity using first a 15 blade off the lesser tuberosity and then cautery down about 2 cm  below the inferior humeral neck around to the 5 o'clock position.  At this time, rotator interval was opened and released.  Coracohumeral ligament was released.  Biceps tendon which was subluxated medially was actually tenodesed to the pec tendon using  five 0 Vicryl sutures.  Next, the shoulder was dislocated.  Proximal reaming was performed up to a size 13.  The head was cut in 30 degrees of retroversion comparable to the patient's known native version.  Broaching then performed up to size 13, which  gave a very nice fit.  A 38 mm cap was placed and attention was directed towards the glenoid.  Posterior glenoid retractor was placed.  The anterior retractor was placed.  Labrum was excised.  A Bankart lesion was created from the 12 o'clock to 6 o'clock  position.  Next, patient-specific guide was used to place the guide pin in the middle portion of the glenoid.  Reaming was performed, both for the baseplate as well as for the augment.  Full contact achieved. The true baseplate was then placed which had  a small augment.  Excellent compression achieved with the compression screw and then 4 peripheral locking screws were placed.  Next, we did trial with the +3 and +6 glenosphere along with the +6 offset humeral tray as well as the standard liner and the  +3 liner.  Optimal stability was achieved with the +6 offset glenosphere and the +3 polyethylene  liner with the +6 offset tray.  This gave very good stability to extension and adduction with superior force along with excellent range of motion with internal and external rotation  at 90 degrees of abduction.  Trial components were removed.  True components then placed in terms of the glenosphere on the [facility]  taper baseplate.  This was tried.  Next, we placed the humeral stem in position after drilling for 6 suture tapes.  We also  placed IrriSept solution which was removed and then vancomycin powder into the humeral canal.  Stem had an excellent fit.  Repeat trial reduction was performed and the +6 offset tray and the +3 polyethylene liner gave optimal stability and they were then  placed onto the [facility] taper.  The reduction was "2 fingers tight."  Excellent stability and rotation achieved.  Axillary nerve again palpated and found to be intact.  Irrigation with 4 liters of pouring irrigation was performed.  Next, we placed  IrriSept in the solution followed by vancomycin powder on the implant and that was performed after we repaired the subscapularis with the arm in 30 degrees of external rotation using the suture tapes.  Next, the deltopectoral interval was closed using #1  Vicryl suture followed by interrupted inverted 0 Vicryl suture, 2-0 Vicryl suture, and 3-0 Monocryl with Steri-Strips and Aquacel dressing applied.  Shoulder immobilizer placed.  Luke's assistance was required at all times for retraction, opening,  closing, mobilization of tissue.  His assistance was a medical necessity.   SHW D: 04/26/2023 10:40:46 am T: 04/26/2023 11:36:00 am  JOB: [id number]/ [id number]

## 2023-04-27 NOTE — Plan of Care (Signed)

## 2023-04-27 NOTE — Progress Notes (Addendum)
  Subjective: Cassidy Garcia is a 77 y.o. female s/p right RSA.  They are POD 1.  Pt's pain is controlled.  Block still in effect..  Patient denies any complaints of chest pain, abdominal pain.  She is having some dyspnea when she ambulates around the room and is requiring oxygen with nasal cannula.  Really no complaints aside from the dyspnea.  Feels like it is difficult to take a full deep breath.  Objective: Vital signs in last 24 hours: Temp:  [97 F (36.1 C)-98.8 F (37.1 C)] 98.8 F (37.1 C) (09/11 0826) Pulse Rate:  [55-78] 71 (09/11 0826) Resp:  [15-19] 18 (09/11 0826) BP: (104-123)/(47-67) 123/60 (09/11 0826) SpO2:  [89 %-100 %] 98 % (09/11 0826)  Intake/Output from previous day: 09/10 0701 - 09/11 0700 In: 2293 [P.O.:120; I.V.:1723; IV Piggyback:450] Out: 100 [Blood:100] Intake/Output this shift: No intake/output data recorded.  Exam:  No gross blood or drainage overlying the dressing 2+ radial pulse of the operative extremity Postoperative physical exam somewhat limited by interscalene block but intact EPL, FPL, finger abduction, pronation/supination.  Unable to assess bicep flexion and deltoid function due to nerve block.  Does have lower extremity pitting edema bilaterally.   Labs: No results for input(s): "HGB" in the last 72 hours. No results for input(s): "WBC", "RBC", "HCT", "PLT" in the last 72 hours. No results for input(s): "NA", "K", "CL", "CO2", "BUN", "CREATININE", "GLUCOSE", "CALCIUM" in the last 72 hours. No results for input(s): "LABPT", "INR" in the last 72 hours.  Assessment/Plan: Pt is POD 1 s/p right RSA    -Plan to discharge to home today or tomorrow pending patient's pain and oxygenation status  -Interscalene block is the most likely cause of her dyspnea and need for oxygen with volume overload a possible contributor as well based on the pitting edema she has in both lower extremities.  We will see how her dyspnea improves over the course of the day  as the block wears off  -Ordered CXR and EKG  -No lifting with the operative arm  -Stay in sling except for showering/sleeping and using CPM machine at home.  No lifting with the operative arm more than 1 to 2 pounds  -Follow-up with Dr. August Saucer in clinic 2 weeks postoperatively     [facility] 04/27/2023, 8:27 AM

## 2023-04-27 NOTE — Progress Notes (Signed)
PT Cancellation Note  Patient Details Name: Cassidy Garcia MRN: [id number] DOB: [birth year]   Cancelled Treatment:    Reason Eval/Treat Not Completed: Patient at procedure or test/unavailable (Pt off the floor at radiology. Will follow up if time allows.)   Gladys Damme 04/27/2023, 10:00 AM

## 2023-04-30 DIAGNOSIS — M19011 Primary osteoarthritis, right shoulder: Secondary | ICD-10-CM

## 2023-04-30 DIAGNOSIS — M7521 Bicipital tendinitis, right shoulder: Secondary | ICD-10-CM
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
## 2023-05-06 ENCOUNTER — Telehealth: Payer: Self-pay | Admitting: *Deleted

## 2023-05-06 ENCOUNTER — Other Ambulatory Visit: Payer: Self-pay | Admitting: *Deleted

## 2023-05-06 DIAGNOSIS — M19011 Primary osteoarthritis, right shoulder: Secondary | ICD-10-CM

## 2023-05-06 DIAGNOSIS — Z96611 Presence of right artificial shoulder joint: Secondary | ICD-10-CM

## 2023-05-06 NOTE — Telephone Encounter (Signed)
Ortho bundle 7 day call completed.

## 2023-05-11 ENCOUNTER — Ambulatory Visit (INDEPENDENT_AMBULATORY_CARE_PROVIDER_SITE_OTHER): Payer: Self-pay

## 2023-05-11 ENCOUNTER — Encounter: Payer: Self-pay | Admitting: Physical Therapy

## 2023-05-11 ENCOUNTER — Ambulatory Visit (INDEPENDENT_AMBULATORY_CARE_PROVIDER_SITE_OTHER): Payer: Medicare Other | Admitting: Surgical

## 2023-05-11 ENCOUNTER — Ambulatory Visit (INDEPENDENT_AMBULATORY_CARE_PROVIDER_SITE_OTHER): Payer: Medicare Other | Admitting: Physical Therapy

## 2023-05-11 ENCOUNTER — Other Ambulatory Visit: Payer: Self-pay

## 2023-05-11 ENCOUNTER — Telehealth: Payer: Self-pay | Admitting: *Deleted

## 2023-05-11 ENCOUNTER — Encounter: Payer: Self-pay | Admitting: Surgical

## 2023-05-11 DIAGNOSIS — M25611 Stiffness of right shoulder, not elsewhere classified: Secondary | ICD-10-CM | POA: Diagnosis not present

## 2023-05-11 DIAGNOSIS — M6281 Muscle weakness (generalized): Secondary | ICD-10-CM

## 2023-05-11 DIAGNOSIS — M25511 Pain in right shoulder: Secondary | ICD-10-CM

## 2023-05-11 DIAGNOSIS — R293 Abnormal posture: Secondary | ICD-10-CM | POA: Diagnosis not present

## 2023-05-11 DIAGNOSIS — Z96611 Presence of right artificial shoulder joint: Secondary | ICD-10-CM | POA: Diagnosis not present

## 2023-05-11 NOTE — Telephone Encounter (Signed)
Ortho bundle in office meeting completed. Patient doing well and starting OPPT today.

## 2023-05-11 NOTE — Therapy (Signed)
OUTPATIENT PHYSICAL THERAPY EVALUATION   Patient Name: Cassidy Garcia MRN: [id number] DOB:March 24, 1946, 77 y.o., female Today's Date: 05/11/2023  END OF SESSION:  PT End of Session - 05/11/23 1331     Visit Number 1    Date for PT Re-Evaluation 07/06/23    PT Start Time 1335    PT Stop Time 1402    PT Time Calculation (min) 27 min    Activity Tolerance Patient tolerated treatment well    Behavior During Therapy [facility] for tasks assessed/performed             Past Medical History:  Diagnosis Date   Arthritis    Asthma    Bronchitis 03/2016   Chronic kidney disease    stage 3   Colon polyps    tubular adenoma   Constipation    Depression    pt denies   Diverticulosis    GERD (gastroesophageal reflux disease)    Hemorrhoids    History of hiatal hernia    Hyperlipidemia    Hypertension    Hypothyroidism    Shortness of breath    with exertion on occasion   Sinusitis    Skin cancer    Past Surgical History:  Procedure Laterality Date   ABDOMINAL HYSTERECTOMY     BICEPT TENODESIS Right 04/26/2023   Procedure: RIGHT BICEPS TENODESIS;  Surgeon: Cammy Copa, MD;  Location: [facility] OR;  Service: Orthopedics;  Laterality: Right;   CARPOMETACARPAL (CMC) FUSION OF THUMB Bilateral    CATARACT EXTRACTION, BILATERAL     CHOLECYSTECTOMY     COLONOSCOPY  02/12/2011   HAMMER TOE SURGERY Right    LAPAROSCOPIC APPENDECTOMY N/A 08/26/2021   Procedure: APPENDECTOMY LAPAROSCOPIC WITH LYSIS OF ADHESIONS;  Surgeon: Karie Soda, MD;  Location: [facility] ORS;  Service: General;  Laterality: N/A;   NASAL SINUS SURGERY     POLYPECTOMY     REVERSE SHOULDER ARTHROPLASTY Right 04/26/2023   Procedure: RIGHT REVERSE SHOULDER ARTHROPLASTY;  Surgeon: Cammy Copa, MD;  Location: [facility] OR;  Service: Orthopedics;  Laterality: Right;   TOTAL HIP ARTHROPLASTY  12/10/2011   Procedure: TOTAL HIP ARTHROPLASTY ANTERIOR APPROACH;  Surgeon: Kathryne Hitch, MD;  Location: [facility] ORS;  Service:  Orthopedics;  Laterality: Right;  Right Total Hip Arthroplasty, Anterior Approach   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   Rotator cuff tendonitis, right 07/02/2022   Nausea 12/02/2021   Bright red rectal bleeding 09/15/2021   History of colonic polyps 07/13/2021   Gastroenteritis 09/09/2020   Anemia 03/06/2020   Hypothyroidism 02/04/2020   Depression 01/24/2020   Asthma 01/24/2020   CKD (chronic kidney disease), stage III (HCC) 01/24/2020   Acute on chronic renal failure (HCC) 01/24/2020   Constipation 12/20/2018   Pain in right hip 06/29/2017   Trochanteric bursitis, right hip 06/29/2017   History of total replacement of right hip 06/29/2017   Hemorrhoids 03/01/2017   Great toe pain, left 12/04/2016   Chronic right shoulder pain 12/04/2016   Non-toxic multinodular goiter 05/14/2013   Degenerative arthritis of hip 12/10/2011   HTN (hypertension) 07/06/2011   Hyperlipidemia 07/06/2011   Gastro-esophageal reflux disease without esophagitis 09/23/2009    PCP: Geoffry Paradise, MD  REFERRING PROVIDER: Cammy Copa, MD  REFERRING DIAG: M19.011 (ICD-10-CM) - Arthritis of right shoulder region Z96.611 (ICD-10-CM) - S/P reverse total shoulder arthroplasty,  right  Rationale for Evaluation and Treatment: Rehabilitation  THERAPY DIAG:  Acute pain of right shoulder - Plan: PT plan of care cert/re-cert  Stiffness of right shoulder, not elsewhere classified - Plan: PT plan of care cert/re-cert  Muscle weakness (generalized) - Plan: PT plan of care cert/re-cert  Abnormal posture - Plan: PT plan of care cert/re-cert  ONSET DATE: DOS: 04/26/23   SUBJECTIVE:                                                                                                                                                                                            SUBJECTIVE STATEMENT: Pt is s/p Rt RSA on 04/26/23. She just had first follow up visit with surgeon and so far everything is healing well  PERTINENT HISTORY:  OA, asthma, depression, HTN  PAIN:  Are you having pain? Yes: NPRS scale: 0 currently, up to 2/10 Pain location: Rt shoulder Pain description: ache, dull Aggravating factors: CPM near 90 deg Relieving factors: nothing  PRECAUTIONS:  Shoulder: P/AROM, no ER > 30 deg, okay for deltoid isometrics, no lifting anything heavier than a cup of coffee or cell phone  RED FLAGS: None   WEIGHT BEARING RESTRICTIONS:  No  FALLS:  Has patient fallen in last 6 months? No  LIVING ENVIRONMENT: Lives with: lives alone  OCCUPATION:  Retired  PLOF:  Independent and Leisure: watch TV, puzzles  PATIENT GOALS:  Improved ROM and strength; be able to put dishes and clothes away; reach behind back   OBJECTIVE:   PATIENT SURVEYS:  05/11/23 FOTO 44 (predicted 59)  COGNITIVE STATUS: Within functional limits for tasks assessed   SENSATION: WFL  POSTURE:  rounded shoulders and forward head  HAND DOMINANCE:  Right  GAIT: 05/11/23 Comments: independent  UPPER EXTREMITY ROM:  A/P ROM Right eval  Shoulder flexion A: 112 P: 122 (Supine)  Shoulder extension   Shoulder abduction A: 97 P: 102 (Supine)  Shoulder adduction   Shoulder extension   Shoulder internal rotation   Shoulder external rotation P: 25 (Supine)   (Blank rows = not tested)   UPPER EXTREMITY MMT:    05/11/23: deferred due to post-op status  MMT Right eval Left eval  Shoulder flexion    Shoulder extension    Shoulder abduction    Shoulder adduction    Shoulder extension    Shoulder internal rotation    Shoulder external rotation    Middle trapezius    Lower trapezius    Elbow flexion    Elbow extension    Wrist flexion    Wrist extension    Wrist ulnar deviation  Wrist radial deviation    Wrist pronation     Wrist supination    Grip strength     (Blank rows = not tested)   TREATMENT:                                                                                                                              DATE:  05/11/23 See HEP - performed trial reps with mod cues for technique and comprehension     PATIENT EDUCATION:  Education details: HEP Person educated: Patient Education method: Explanation, Demonstration, and Handouts Education comprehension: verbalized understanding, returned demonstration, and needs further education  HOME EXERCISE PROGRAM: Access Code: [email or web address] URL: [email or web address] Date: 05/11/2023 Prepared by: Moshe Cipro  Exercises - Supine Shoulder Flexion with Dowel  - 3-5 x daily - 7 x weekly - 1 sets - 5-10 reps - 3-5 sec hold - Standing Shoulder Abduction ROM with Dowel  - 3-5 x daily - 7 x weekly - 1 sets - 5-10 reps - 3-5 sec hold - Standing Scapular Retraction  - 3-5 x daily - 7 x weekly - 1 sets - 5-10 reps - 5 sec hold - Standing Backward Shoulder Rolls  - 3-5 x daily - 7 x weekly - 1 sets - 5-10 reps   ASSESSMENT:  CLINICAL IMPRESSION: Patient is a 77 y.o. female who was seen today for physical therapy evaluation and treatment for Rt RSA on 04/26/23. She demonstrates decreased ROM and strength as well as expected post op pain and restrictions affecting functional mobility.  She will benefit from PT to address deficits listed.    OBJECTIVE IMPAIRMENTS: decreased ROM, decreased strength, increased fascial restrictions, increased muscle spasms, impaired UE functional use, postural dysfunction, and pain.   ACTIVITY LIMITATIONS: carrying, lifting, bathing, toileting, dressing, reach over head, and hygiene/grooming  PARTICIPATION LIMITATIONS: meal prep, cleaning, laundry, driving, shopping, and community activity  PERSONAL FACTORS: 3+ comorbidities: OA, asthma, depression, HTN  are also affecting patient's functional outcome.    REHAB POTENTIAL: Excellent  CLINICAL DECISION MAKING: Evolving/moderate complexity  EVALUATION COMPLEXITY: Moderate   GOALS: Goals reviewed with patient? Yes  SHORT TERM GOALS: Target date: 06/08/2023  Independent with initial HEP Goal status: INITIAL  2.  Rt shoulder flexion and abduction AROM improved 10 deg from improved function and mobility Goal status: INITIAL   LONG TERM GOALS: Target date: 07/06/2023  Independent with final HEP Goal status: INITIAL  2.  FOTO score improved to 59 Goal status: INITIAL  3.  Rt shoulder AROM improved to [facility] in standing for improved function and ADLs Goal status: INIITAL  4.  Report pain < 2/10 with reaching overhead for improved function Goal status: INITIAL  5.  Report ability to sleep on side with pain </= 2/10 Goal status: INITIAL   PLAN:  PT FREQUENCY: 1-2x/week  PT DURATION: 8 weeks  PLANNED INTERVENTIONS: Therapeutic exercises, Therapeutic activity, Neuromuscular re-education, Patient/Family education, Self Care, Joint mobilization, Aquatic Therapy, Dry Needling,  Electrical stimulation, Cryotherapy, Moist heat, Taping, Vasopneumatic device, Ultrasound, Manual therapy, and Re-evaluation.  PLAN FOR NEXT SESSION: review HEP, deltoid isometrics, AA standing ROM exercises   NEXT MD VISIT: 06/08/23   Clarita Crane, PT, DPT 05/11/23 2:11 PM

## 2023-05-11 NOTE — Progress Notes (Signed)
Post-Op Visit Note   Patient: Cassidy Garcia           Date of Birth: 1945-09-16           MRN: [id number] Visit Date: 05/11/2023 PCP: Geoffry Paradise, MD   Assessment & Plan:  Chief Complaint:  Chief Complaint  Patient presents with   Right Shoulder - Routine Post Op    RIGHT RSA (surgery date 04-26-23)   Visit Diagnoses:  1. History of arthroplasty of right shoulder     Plan: [name] is a 77 y.o. female who presents s/p right reverse shoulder arthroplasty on 04/26/23.  Patient is doing well and pain is overall controlled.  Up to 90 degrees on CPM machine.  Denies any chest pain, SOB, fevers, chills.  No complaint of any instability symptoms.  Starting physical therapy upstairs today.  Has had a nonproductive cough since being in the hospital but this is getting better.  Also notes some unilateral leg swelling in the right lower extremity but this is not causing her any discomfort she has just noticed this.  No history of DVT/PE.  On exam, patient has range of motion 25 degrees X rotation, 70 degrees abduction, 95 degrees forward elevation passively.  Intact EPL, FPL, finger abduction, finger adduction, pronation/supination, bicep, tricep, deltoid of operative extremity.  Axillary nerve intact with deltoid firing.  Incision is healing well without evidence of infection or dehiscence.  Incision was made sure to be covered with Steri-Strips from the proximal to distal aspect of the length of the incision.  2+ radial pulse of the operative extremity.  No calf tenderness.  Negative Denna Haggard' sign of the right lower extremity.  Plan is discontinue sling.  Okay to very lightly lift with the operative extremity but no lifting anything heavier than a coffee cup or cell phone.  Start physical therapy to focus on passive range of motion and active range of motion with deltoid isometrics.  Do not want to externally rotate past 30 degrees to protect subscapularis repair.  Follow-up in 4 weeks for  clinical recheck.    Follow-Up Instructions: Return in about 4 weeks (around 06/08/2023).   Orders:  Orders Placed This Encounter  Procedures   XR Shoulder Right   No orders of the defined types were placed in this encounter.   Imaging: No results found.  PMFS History: 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   Rotator cuff tendonitis, right 07/02/2022   Nausea 12/02/2021   Bright red rectal bleeding 09/15/2021   History of colonic polyps 07/13/2021   Gastroenteritis 09/09/2020   Anemia 03/06/2020   Hypothyroidism 02/04/2020   Depression 01/24/2020   Asthma 01/24/2020   CKD (chronic kidney disease), stage III (HCC) 01/24/2020   Acute on chronic renal failure (HCC) 01/24/2020   Constipation 12/20/2018   Pain in right hip 06/29/2017   Trochanteric bursitis, right hip 06/29/2017   History of total replacement of right hip 06/29/2017   Hemorrhoids 03/01/2017   Great toe pain, left 12/04/2016   Chronic right shoulder pain 12/04/2016   Non-toxic multinodular goiter 05/14/2013   Degenerative arthritis of hip 12/10/2011   HTN (hypertension) 07/06/2011   Hyperlipidemia 07/06/2011   Gastro-esophageal reflux disease without esophagitis 09/23/2009   Past Medical History:  Diagnosis Date   Arthritis    Asthma    Bronchitis 03/2016  Chronic kidney disease    stage 3   Colon polyps    tubular adenoma   Constipation    Depression    pt denies   Diverticulosis    GERD (gastroesophageal reflux disease)    Hemorrhoids    History of hiatal hernia    Hyperlipidemia    Hypertension    Hypothyroidism    Shortness of breath    with exertion on occasion   Sinusitis    Skin cancer     Family History  Problem Relation Age of Onset   Ovarian cancer Mother    Anxiety disorder Mother    Heart disease Paternal  Grandmother    Other Paternal Aunt        brain tumor   Birth defects Sister        no iris   Depression Sister    Anxiety disorder Sister    Colon cancer Neg Hx     Past Surgical History:  Procedure Laterality Date   ABDOMINAL HYSTERECTOMY     BICEPT TENODESIS Right 04/26/2023   Procedure: RIGHT BICEPS TENODESIS;  Surgeon: Cammy Copa, MD;  Location: [facility] OR;  Service: Orthopedics;  Laterality: Right;   CARPOMETACARPAL (CMC) FUSION OF THUMB Bilateral    CATARACT EXTRACTION, BILATERAL     CHOLECYSTECTOMY     COLONOSCOPY  02/12/2011   HAMMER TOE SURGERY Right    LAPAROSCOPIC APPENDECTOMY N/A 08/26/2021   Procedure: APPENDECTOMY LAPAROSCOPIC WITH LYSIS OF ADHESIONS;  Surgeon: Karie Soda, MD;  Location: [facility];  Service: General;  Laterality: N/A;   NASAL SINUS SURGERY     POLYPECTOMY     REVERSE SHOULDER ARTHROPLASTY Right 04/26/2023   Procedure: RIGHT REVERSE SHOULDER ARTHROPLASTY;  Surgeon: Cammy Copa, MD;  Location: [facility] OR;  Service: Orthopedics;  Laterality: Right;   TOTAL HIP ARTHROPLASTY  12/10/2011   Procedure: TOTAL HIP ARTHROPLASTY ANTERIOR APPROACH;  Surgeon: Kathryne Hitch, MD;  Location: [facility];  Service: Orthopedics;  Laterality: Right;  Right Total Hip Arthroplasty, Anterior Approach   Social History   Occupational History   Occupation: retired    Associate Professor: RETIRED  Tobacco Use   Smoking status: Former    Current packs/day: 0.00    Types: Cigarettes    Quit date: 08/16/2001    Years since quitting: 21.7   Smokeless tobacco: Never  Vaping Use   Vaping status: Never Used  Substance and Sexual Activity   Alcohol use: Not Currently   Drug use: No   Sexual activity: Not on file

## 2023-05-16 ENCOUNTER — Encounter: Payer: Medicare Other | Admitting: Physical Therapy

## 2023-05-16 ENCOUNTER — Ambulatory Visit (INDEPENDENT_AMBULATORY_CARE_PROVIDER_SITE_OTHER): Payer: Medicare Other | Admitting: Rehabilitative and Restorative Service Providers"

## 2023-05-16 ENCOUNTER — Encounter: Payer: Self-pay | Admitting: Rehabilitative and Restorative Service Providers"

## 2023-05-16 DIAGNOSIS — M25511 Pain in right shoulder: Secondary | ICD-10-CM | POA: Diagnosis not present

## 2023-05-16 DIAGNOSIS — M25611 Stiffness of right shoulder, not elsewhere classified: Secondary | ICD-10-CM

## 2023-05-16 DIAGNOSIS — M6281 Muscle weakness (generalized): Secondary | ICD-10-CM

## 2023-05-16 DIAGNOSIS — R293 Abnormal posture: Secondary | ICD-10-CM | POA: Diagnosis not present

## 2023-05-16 NOTE — Therapy (Deleted)
OUTPATIENT PHYSICAL THERAPY EVALUATION   Patient Name: Cassidy Garcia MRN: [id number] DOB:03-16-1946, 77 y.o., female Today's Date: 05/16/2023  END OF SESSION:    Past Medical History:  Diagnosis Date   Arthritis    Asthma    Bronchitis 03/2016   Chronic kidney disease    stage 3   Colon polyps    tubular adenoma   Constipation    Depression    pt denies   Diverticulosis    GERD (gastroesophageal reflux disease)    Hemorrhoids    History of hiatal hernia    Hyperlipidemia    Hypertension    Hypothyroidism    Shortness of breath    with exertion on occasion   Sinusitis    Skin cancer    Past Surgical History:  Procedure Laterality Date   ABDOMINAL HYSTERECTOMY     BICEPT TENODESIS Right 04/26/2023   Procedure: RIGHT BICEPS TENODESIS;  Surgeon: Cammy Copa, MD;  Location: [facility] OR;  Service: Orthopedics;  Laterality: Right;   CARPOMETACARPAL (CMC) FUSION OF THUMB Bilateral    CATARACT EXTRACTION, BILATERAL     CHOLECYSTECTOMY     COLONOSCOPY  02/12/2011   HAMMER TOE SURGERY Right    LAPAROSCOPIC APPENDECTOMY N/A 08/26/2021   Procedure: APPENDECTOMY LAPAROSCOPIC WITH LYSIS OF ADHESIONS;  Surgeon: Karie Soda, MD;  Location: [facility]S;  Service: General;  Laterality: N/A;   NASAL SINUS SURGERY     POLYPECTOMY     REVERSE SHOULDER ARTHROPLASTY Right 04/26/2023   Procedure: RIGHT REVERSE SHOULDER ARTHROPLASTY;  Surgeon: Cammy Copa, MD;  Location: [facility] OR;  Service: Orthopedics;  Laterality: Right;   TOTAL HIP ARTHROPLASTY  12/10/2011   Procedure: TOTAL HIP ARTHROPLASTY ANTERIOR APPROACH;  Surgeon: Kathryne Hitch, MD;  Location: [facility] ORS;  Service: Orthopedics;  Laterality: Right;  Right Total Hip Arthroplasty, Anterior Approach   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   Rotator cuff tendonitis,  right 07/02/2022   Nausea 12/02/2021   Bright red rectal bleeding 09/15/2021   History of colonic polyps 07/13/2021   Gastroenteritis 09/09/2020   Anemia 03/06/2020   Hypothyroidism 02/04/2020   Depression 01/24/2020   Asthma 01/24/2020   CKD (chronic kidney disease), stage III (HCC) 01/24/2020   Acute on chronic renal failure (HCC) 01/24/2020   Constipation 12/20/2018   Pain in right hip 06/29/2017   Trochanteric bursitis, right hip 06/29/2017   History of total replacement of right hip 06/29/2017   Hemorrhoids 03/01/2017   Great toe pain, left 12/04/2016   Chronic right shoulder pain 12/04/2016   Non-toxic multinodular goiter 05/14/2013   Degenerative arthritis of hip 12/10/2011   HTN (hypertension) 07/06/2011   Hyperlipidemia 07/06/2011   Gastro-esophageal reflux disease without esophagitis 09/23/2009    PCP: Geoffry Paradise, MD  REFERRING PROVIDER: Cammy Copa, MD  REFERRING DIAG: M19.011 (ICD-10-CM) - Arthritis of right shoulder region Z96.611 (ICD-10-CM) - S/P reverse total shoulder arthroplasty, right  Rationale for Evaluation and Treatment: Rehabilitation  THERAPY DIAG:  No diagnosis found.  ONSET DATE: DOS: 04/26/23   SUBJECTIVE:  SUBJECTIVE STATEMENT: Pt is s/p Rt RSA on 04/26/23. She just had first follow up visit with surgeon and so far everything is healing well  PERTINENT HISTORY:  OA, asthma, depression, HTN  PAIN:  Are you having pain? Yes: NPRS scale: 0 currently, up to 2/10 Pain location: Rt shoulder Pain description: ache, dull Aggravating factors: CPM near 90 deg Relieving factors: nothing  PRECAUTIONS:  Shoulder: P/AROM, no ER > 30 deg, okay for deltoid isometrics, no lifting anything heavier than a cup of coffee or cell phone  RED  FLAGS: None   WEIGHT BEARING RESTRICTIONS:  No  FALLS:  Has patient fallen in last 6 months? No  LIVING ENVIRONMENT: Lives with: lives alone  OCCUPATION:  Retired  PLOF:  Independent and Leisure: watch TV, puzzles  PATIENT GOALS:  Improved ROM and strength; be able to put dishes and clothes away; reach behind back   OBJECTIVE:   PATIENT SURVEYS:  05/11/23 FOTO 44 (predicted 59)  COGNITIVE STATUS: Within functional limits for tasks assessed   SENSATION: WFL  POSTURE:  rounded shoulders and forward head  HAND DOMINANCE:  Right  GAIT: 05/11/23 Comments: independent  UPPER EXTREMITY ROM:  A/P ROM Right eval  Shoulder flexion A: 112 P: 122 (Supine)  Shoulder extension   Shoulder abduction A: 97 P: 102 (Supine)  Shoulder adduction   Shoulder extension   Shoulder internal rotation   Shoulder external rotation P: 25 (Supine)   (Blank rows = not tested)   UPPER EXTREMITY MMT:    05/11/23: deferred due to post-op status  MMT Right eval Left eval  Shoulder flexion    Shoulder extension    Shoulder abduction    Shoulder adduction    Shoulder extension    Shoulder internal rotation    Shoulder external rotation    Middle trapezius    Lower trapezius    Elbow flexion    Elbow extension    Wrist flexion    Wrist extension    Wrist ulnar deviation    Wrist radial deviation    Wrist pronation    Wrist supination    Grip strength     (Blank rows = not tested)   TREATMENT:                                                                                                                              DATE:  05/11/23 See HEP - performed trial reps with mod cues for technique and comprehension     PATIENT EDUCATION:  Education details: HEP Person educated: Patient Education method: Explanation, Demonstration, and Handouts Education comprehension: verbalized understanding, returned demonstration, and needs further education  HOME EXERCISE  PROGRAM: Access Code: [email or web address] URL: [email or web address] Date: 05/11/2023 Prepared by: Moshe Cipro  Exercises - Supine Shoulder Flexion with Dowel  - 3-5 x daily - 7 x weekly - 1 sets - 5-10 reps - 3-5 sec hold - Standing Shoulder Abduction ROM with Dowel  - 3-5  x daily - 7 x weekly - 1 sets - 5-10 reps - 3-5 sec hold - Standing Scapular Retraction  - 3-5 x daily - 7 x weekly - 1 sets - 5-10 reps - 5 sec hold - Standing Backward Shoulder Rolls  - 3-5 x daily - 7 x weekly - 1 sets - 5-10 reps   ASSESSMENT:  CLINICAL IMPRESSION: *** Patient is a 77 y.o. female who was seen today for physical therapy evaluation and treatment for Rt RSA on 04/26/23. She demonstrates decreased ROM and strength as well as expected post op pain and restrictions affecting functional mobility.  She will benefit from PT to address deficits listed.    OBJECTIVE IMPAIRMENTS: decreased ROM, decreased strength, increased fascial restrictions, increased muscle spasms, impaired UE functional use, postural dysfunction, and pain.   ACTIVITY LIMITATIONS: carrying, lifting, bathing, toileting, dressing, reach over head, and hygiene/grooming  PARTICIPATION LIMITATIONS: meal prep, cleaning, laundry, driving, shopping, and community activity  PERSONAL FACTORS: 3+ comorbidities: OA, asthma, depression, HTN  are also affecting patient's functional outcome.   REHAB POTENTIAL: Excellent  CLINICAL DECISION MAKING: Evolving/moderate complexity  EVALUATION COMPLEXITY: Moderate   GOALS: Goals reviewed with patient? Yes  SHORT TERM GOALS: Target date: 06/08/2023  Independent with initial HEP Goal status: INITIAL  2.  Rt shoulder flexion and abduction AROM improved 10 deg from improved function and mobility Goal status: INITIAL   LONG TERM GOALS: Target date: 07/06/2023  Independent with final HEP Goal status: INITIAL  2.  FOTO score improved to 59 Goal status: INITIAL  3.  Rt shoulder AROM  improved to [facility] in standing for improved function and ADLs Goal status: INIITAL  4.  Report pain < 2/10 with reaching overhead for improved function Goal status: INITIAL  5.  Report ability to sleep on side with pain </= 2/10 Goal status: INITIAL   PLAN:  PT FREQUENCY: 1-2x/week  PT DURATION: 8 weeks  PLANNED INTERVENTIONS: Therapeutic exercises, Therapeutic activity, Neuromuscular re-education, Patient/Family education, Self Care, Joint mobilization, Aquatic Therapy, Dry Needling, Electrical stimulation, Cryotherapy, Moist heat, Taping, Vasopneumatic device, Ultrasound, Manual therapy, and Re-evaluation.  PLAN FOR NEXT SESSION: *** review HEP, deltoid isometrics, AA standing ROM exercises   NEXT MD VISIT: 06/08/23   Clarita Crane, PT, DPT 05/16/23 7:19 AM

## 2023-05-16 NOTE — Therapy (Signed)
OUTPATIENT PHYSICAL THERAPY TREATMENT   Patient Name: Cassidy Garcia MRN: [id number] DOB:07/09/1946, 77 y.o., female Today's Date: 05/16/2023  END OF SESSION:  PT End of Session - 05/16/23 1600     Visit Number 2    Number of Visits 16    Date for PT Re-Evaluation 07/06/23    Authorization Type Medicare    Progress Note Due on Visit 10    PT Start Time 1543    PT Stop Time 1622    PT Time Calculation (min) 39 min    Activity Tolerance Patient tolerated treatment well    Behavior During Therapy [facility] for tasks assessed/performed              Past Medical History:  Diagnosis Date   Arthritis    Asthma    Bronchitis 03/2016   Chronic kidney disease    stage 3   Colon polyps    tubular adenoma   Constipation    Depression    pt denies   Diverticulosis    GERD (gastroesophageal reflux disease)    Hemorrhoids    History of hiatal hernia    Hyperlipidemia    Hypertension    Hypothyroidism    Shortness of breath    with exertion on occasion   Sinusitis    Skin cancer    Past Surgical History:  Procedure Laterality Date   ABDOMINAL HYSTERECTOMY     BICEPT TENODESIS Right 04/26/2023   Procedure: RIGHT BICEPS TENODESIS;  Surgeon: Cammy Copa, MD;  Location: [facility] OR;  Service: Orthopedics;  Laterality: Right;   CARPOMETACARPAL (CMC) FUSION OF THUMB Bilateral    CATARACT EXTRACTION, BILATERAL     CHOLECYSTECTOMY     COLONOSCOPY  02/12/2011   HAMMER TOE SURGERY Right    LAPAROSCOPIC APPENDECTOMY N/A 08/26/2021   Procedure: APPENDECTOMY LAPAROSCOPIC WITH LYSIS OF ADHESIONS;  Surgeon: Karie Soda, MD;  Location: [facility] ORS;  Service: General;  Laterality: N/A;   NASAL SINUS SURGERY     POLYPECTOMY     REVERSE SHOULDER ARTHROPLASTY Right 04/26/2023   Procedure: RIGHT REVERSE SHOULDER ARTHROPLASTY;  Surgeon: Cammy Copa, MD;  Location: [facility] OR;  Service: Orthopedics;  Laterality: Right;   TOTAL HIP ARTHROPLASTY  12/10/2011   Procedure: TOTAL HIP ARTHROPLASTY  ANTERIOR APPROACH;  Surgeon: Kathryne Hitch, MD;  Location: [facility] ORS;  Service: Orthopedics;  Laterality: Right;  Right Total Hip Arthroplasty, Anterior Approach   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   Rotator cuff tendonitis, right 07/02/2022   Nausea 12/02/2021   Bright red rectal bleeding 09/15/2021   History of colonic polyps 07/13/2021   Gastroenteritis 09/09/2020   Anemia 03/06/2020   Hypothyroidism 02/04/2020   Depression 01/24/2020   Asthma 01/24/2020   CKD (chronic kidney disease), stage III (HCC) 01/24/2020   Acute on chronic renal failure (HCC) 01/24/2020   Constipation 12/20/2018   Pain in right hip 06/29/2017   Trochanteric bursitis, right hip 06/29/2017   History of total replacement of right hip 06/29/2017   Hemorrhoids 03/01/2017   Great toe pain, left 12/04/2016   Chronic right shoulder pain 12/04/2016   Non-toxic multinodular goiter 05/14/2013   Degenerative arthritis of hip 12/10/2011   HTN (hypertension) 07/06/2011   Hyperlipidemia 07/06/2011   Gastro-esophageal reflux disease without esophagitis 09/23/2009    PCP: Geoffry Paradise, MD  REFERRING PROVIDER:  Cammy Copa, MD  REFERRING DIAG: [phone] (ICD-10-CM) - Arthritis of right shoulder region Z96.611 (ICD-10-CM) - S/P reverse total shoulder arthroplasty, right  Rationale for Evaluation and Treatment: Rehabilitation  THERAPY DIAG:  Acute pain of right shoulder  Stiffness of right shoulder, not elsewhere classified  Muscle weakness (generalized)  Abnormal posture  ONSET DATE: DOS: 04/26/23   SUBJECTIVE:                                                                                                                                                                                           SUBJECTIVE STATEMENT: Pt indicated  having soreness in arm.  Reported having a pin point place on lateral upper arm.  Not taking routine pain medicine as pain isn't as bad.   PERTINENT HISTORY:  OA, asthma, depression, HTN  PAIN:   NPRS scale: today at worst: 4/10 Pain location: Rt shoulder Pain description: ache, dull Aggravating factors: CPM near 90 deg Relieving factors: nothing  PRECAUTIONS:  Shoulder: P/AROM, no ER > 30 deg, okay for deltoid isometrics, no lifting anything heavier than a cup of coffee or cell phone  RED FLAGS: None   WEIGHT BEARING RESTRICTIONS:  No  FALLS:  Has patient fallen in last 6 months? No  LIVING ENVIRONMENT: Lives with: lives alone  OCCUPATION:  Retired  PLOF:  Independent and Leisure: watch TV, puzzles  PATIENT GOALS:  Improved ROM and strength; be able to put dishes and clothes away; reach behind back   OBJECTIVE:   PATIENT SURVEYS:  05/11/23 FOTO 44 (predicted 59)  COGNITIVE STATUS: 05/11/23 Within functional limits for tasks assessed   SENSATION: 05/11/23 WFL  POSTURE:  05/11/23 rounded shoulders and forward head  HAND DOMINANCE:  05/11/23 Right  GAIT: 05/11/23 Comments: independent  UPPER EXTREMITY ROM:  A/P ROM Right 05/11/23 Right 05/16/2023  Shoulder flexion A: 112 P: 122 (Supine) AROM 140 deg  Shoulder extension    Shoulder abduction A: 97 P: 102 (Supine)   Shoulder adduction    Shoulder extension    Shoulder internal rotation    Shoulder external rotation P: 25 (Supine)    (Blank rows = not tested)   UPPER EXTREMITY MMT:    05/11/23: deferred due to post-op status  MMT Right eval Left eval  Shoulder flexion    Shoulder extension    Shoulder abduction    Shoulder adduction    Shoulder extension    Shoulder internal rotation    Shoulder external rotation    Middle trapezius    Lower trapezius    Elbow flexion    Elbow extension    Wrist flexion  Wrist extension    Wrist ulnar deviation    Wrist radial deviation     Wrist pronation    Wrist supination    Grip strength     (Blank rows = not tested)                                                                                                                                              TODAYS TREATMENT:                                                                                            DATE: 05/16/2023 Therex: Pulleys flexion, scaption 3 mins each way with Rt arm outstretched.  Isometric Rt shoulder flexion, abduction , ER sub max, painfree with arm at side 5 sec on/off x 12 each way Supine wand flexion AAROM 1 lb bar x 15 Supine wand flexion AROM bilaterally 2 x 10   Additional time in education and review of existing HEP.   TODAYS TREATMENT:                                                                                            DATE: 05/11/23 See HEP - performed trial reps with mod cues for technique and comprehension    PATIENT EDUCATION:  05/16/2023 Education details: HEP update Person educated: Patient Education method: Explanation, Demonstration, and Handouts Education comprehension: verbalized understanding, returned demonstration, and needs further education  HOME EXERCISE PROGRAM: Access Code: [email or web address] URL: [email or web address] Date: 05/16/2023 Prepared by: Chyrel Masson  Exercises - Supine Shoulder Flexion with Dowel  - 3-5 x daily - 7 x weekly - 1 sets - 5-10 reps - 3-5 sec hold - Standing Shoulder Abduction ROM with Dowel  - 3-5 x daily - 7 x weekly - 1 sets - 5-10 reps - 3-5 sec hold - Standing Scapular Retraction  - 3-5 x daily - 7 x weekly - 1 sets - 5-10 reps - 5 sec hold - Standing Backward Shoulder Rolls  - 3-5 x daily - 7 x weekly - 1 sets - 5-10 reps - Isometric Shoulder Flexion at Wall  - 2 x daily -  7 x weekly - 1 sets - 10 reps - 5 hold - Isometric Shoulder External Rotation at Wall  - 2 x daily - 7 x weekly - 1 sets - 10 reps - 5 hold - Isometric Shoulder Abduction at Wall  - 2 x daily - 7  x weekly - 1 sets - 10 reps - 5 hold   ASSESSMENT:  CLINICAL IMPRESSION: Good tolerance and improved quality of passive mobility and AAROM as noted in intervention.  Progression to include isometrics as performed today with good overall tolerance.  Continued skilled PT services warranted at this time.    OBJECTIVE IMPAIRMENTS: decreased ROM, decreased strength, increased fascial restrictions, increased muscle spasms, impaired UE functional use, postural dysfunction, and pain.   ACTIVITY LIMITATIONS: carrying, lifting, bathing, toileting, dressing, reach over head, and hygiene/grooming  PARTICIPATION LIMITATIONS: meal prep, cleaning, laundry, driving, shopping, and community activity  PERSONAL FACTORS: 3+ comorbidities: OA, asthma, depression, HTN  are also affecting patient's functional outcome.   REHAB POTENTIAL: Excellent  CLINICAL DECISION MAKING: Evolving/moderate complexity  EVALUATION COMPLEXITY: Moderate   GOALS: Goals reviewed with patient? Yes  SHORT TERM GOALS: Target date: 06/08/2023  Independent with initial HEP Goal status: on going 05/16/2023  2.  Rt shoulder flexion and abduction AROM improved 10 deg from improved function and mobility Goal status: on going 05/16/2023   LONG TERM GOALS: Target date: 07/06/2023  Independent with final HEP Goal status: INITIAL  2.  FOTO score improved to 59 Goal status: INITIAL  3.  Rt shoulder AROM improved to [facility] in standing for improved function and ADLs Goal status: INIITAL  4.  Report pain < 2/10 with reaching overhead for improved function Goal status: INITIAL  5.  Report ability to sleep on side with pain </= 2/10 Goal status: INITIAL   PLAN:  PT FREQUENCY: 1-2x/week  PT DURATION: 8 weeks  PLANNED INTERVENTIONS: Therapeutic exercises, Therapeutic activity, Neuromuscular re-education, Patient/Family education, Self Care, Joint mobilization, Aquatic Therapy, Dry Needling, Electrical stimulation, Cryotherapy,  Moist heat, Taping, Vasopneumatic device, Ultrasound, Manual therapy, and Re-evaluation.  PLAN FOR NEXT SESSION:AAROM/ AROM progression as tolerated.  No IR loaded strengthening, no ER past 30 deg   NEXT MD VISIT: 06/08/23  Chyrel Masson, PT, DPT, OCS, ATC 05/16/23  4:21 PM

## 2023-05-18 ENCOUNTER — Encounter: Payer: Self-pay | Admitting: Physical Therapy

## 2023-05-18 ENCOUNTER — Encounter: Payer: Medicare Other | Admitting: Rehabilitative and Restorative Service Providers"

## 2023-05-18 ENCOUNTER — Ambulatory Visit (INDEPENDENT_AMBULATORY_CARE_PROVIDER_SITE_OTHER): Payer: Medicare Other | Admitting: Physical Therapy

## 2023-05-18 DIAGNOSIS — M6281 Muscle weakness (generalized): Secondary | ICD-10-CM | POA: Diagnosis not present

## 2023-05-18 DIAGNOSIS — M25511 Pain in right shoulder: Secondary | ICD-10-CM | POA: Diagnosis not present

## 2023-05-18 DIAGNOSIS — R293 Abnormal posture: Secondary | ICD-10-CM

## 2023-05-18 DIAGNOSIS — M25611 Stiffness of right shoulder, not elsewhere classified: Secondary | ICD-10-CM

## 2023-05-18 NOTE — Therapy (Signed)
OUTPATIENT PHYSICAL THERAPY TREATMENT   Patient Name: Cassidy Garcia MRN: [id number] DOB:December 10, 1945, 77 y.o., female Today's Date: 05/18/2023  END OF SESSION:  PT End of Session - 05/18/23 1300     Visit Number 3    Number of Visits 16    Date for PT Re-Evaluation 07/06/23    Authorization Type Medicare    Progress Note Due on Visit 10    PT Start Time 1300    PT Stop Time 1343    PT Time Calculation (min) 43 min    Activity Tolerance Patient tolerated treatment well    Behavior During Therapy [facility] for tasks assessed/performed               Past Medical History:  Diagnosis Date   Arthritis    Asthma    Bronchitis 03/2016   Chronic kidney disease    stage 3   Colon polyps    tubular adenoma   Constipation    Depression    pt denies   Diverticulosis    GERD (gastroesophageal reflux disease)    Hemorrhoids    History of hiatal hernia    Hyperlipidemia    Hypertension    Hypothyroidism    Shortness of breath    with exertion on occasion   Sinusitis    Skin cancer    Past Surgical History:  Procedure Laterality Date   ABDOMINAL HYSTERECTOMY     BICEPT TENODESIS Right 04/26/2023   Procedure: RIGHT BICEPS TENODESIS;  Surgeon: Cammy Copa, MD;  Location: [facility] OR;  Service: Orthopedics;  Laterality: Right;   CARPOMETACARPAL (CMC) FUSION OF THUMB Bilateral    CATARACT EXTRACTION, BILATERAL     CHOLECYSTECTOMY     COLONOSCOPY  02/12/2011   HAMMER TOE SURGERY Right    LAPAROSCOPIC APPENDECTOMY N/A 08/26/2021   Procedure: APPENDECTOMY LAPAROSCOPIC WITH LYSIS OF ADHESIONS;  Surgeon: Karie Soda, MD;  Location: [facility];  Service: General;  Laterality: N/A;   NASAL SINUS SURGERY     POLYPECTOMY     REVERSE SHOULDER ARTHROPLASTY Right 04/26/2023   Procedure: RIGHT REVERSE SHOULDER ARTHROPLASTY;  Surgeon: Cammy Copa, MD;  Location: [facility] OR;  Service: Orthopedics;  Laterality: Right;   TOTAL HIP ARTHROPLASTY  12/10/2011   Procedure: TOTAL HIP ARTHROPLASTY  ANTERIOR APPROACH;  Surgeon: Kathryne Hitch, MD;  Location: [facility] ORS;  Service: Orthopedics;  Laterality: Right;  Right Total Hip Arthroplasty, Anterior Approach   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   Rotator cuff tendonitis, right 07/02/2022   Nausea 12/02/2021   Bright red rectal bleeding 09/15/2021   History of colonic polyps 07/13/2021   Gastroenteritis 09/09/2020   Anemia 03/06/2020   Hypothyroidism 02/04/2020   Depression 01/24/2020   Asthma 01/24/2020   CKD (chronic kidney disease), stage III (HCC) 01/24/2020   Acute on chronic renal failure (HCC) 01/24/2020   Constipation 12/20/2018   Pain in right hip 06/29/2017   Trochanteric bursitis, right hip 06/29/2017   History of total replacement of right hip 06/29/2017   Hemorrhoids 03/01/2017   Great toe pain, left 12/04/2016   Chronic right shoulder pain 12/04/2016   Non-toxic multinodular goiter 05/14/2013   Degenerative arthritis of hip 12/10/2011   HTN (hypertension) 07/06/2011   Hyperlipidemia 07/06/2011   Gastro-esophageal reflux disease without esophagitis 09/23/2009    PCP: Geoffry Paradise, MD  REFERRING  PROVIDER: Cammy Copa, MD  REFERRING DIAG: [phone] (ICD-10-CM) - Arthritis of right shoulder region Z96.611 (ICD-10-CM) - S/P reverse total shoulder arthroplasty, right  Rationale for Evaluation and Treatment: Rehabilitation  THERAPY DIAG:  Acute pain of right shoulder  Stiffness of right shoulder, not elsewhere classified  Muscle weakness (generalized)  Abnormal posture  ONSET DATE: DOS: 04/26/23   SUBJECTIVE:                                                                                                                                                                                           SUBJECTIVE STATEMENT: Doing well;  new exercises are okay  PERTINENT HISTORY:  OA, asthma, depression, HTN  PAIN:   NPRS scale: no pain upon arrival/10 Pain location: Rt shoulder Pain description: ache, dull Aggravating factors: CPM near 90 deg Relieving factors: nothing  PRECAUTIONS:  Shoulder: P/AROM, no ER > 30 deg, okay for deltoid isometrics, no lifting anything heavier than a cup of coffee or cell phone  RED FLAGS: None   WEIGHT BEARING RESTRICTIONS:  No  FALLS:  Has patient fallen in last 6 months? No  LIVING ENVIRONMENT: Lives with: lives alone  OCCUPATION:  Retired  PLOF:  Independent and Leisure: watch TV, puzzles  PATIENT GOALS:  Improved ROM and strength; be able to put dishes and clothes away; reach behind back   OBJECTIVE:   PATIENT SURVEYS:  05/11/23 FOTO 44 (predicted 59)  COGNITIVE STATUS: 05/11/23 Within functional limits for tasks assessed   SENSATION: 05/11/23 WFL  POSTURE:  05/11/23 rounded shoulders and forward head  HAND DOMINANCE:  05/11/23 Right  GAIT: 05/11/23 Comments: independent  UPPER EXTREMITY ROM:  A/P ROM Right 05/11/23 Right 05/16/2023  Shoulder flexion A: 112 P: 122 (Supine) AROM 140 deg  Shoulder extension    Shoulder abduction A: 97 P: 102 (Supine)   Shoulder adduction    Shoulder extension    Shoulder internal rotation    Shoulder external rotation P: 25 (Supine)    (Blank rows = not tested)   UPPER EXTREMITY MMT:    05/11/23: deferred due to post-op status  MMT Right eval Left eval  Shoulder flexion    Shoulder extension    Shoulder abduction    Shoulder adduction    Shoulder extension    Shoulder internal rotation    Shoulder external rotation    Middle trapezius    Lower trapezius    Elbow flexion    Elbow extension    Wrist flexion    Wrist extension    Wrist ulnar deviation    Wrist radial deviation    Wrist pronation  Wrist supination    Grip strength     (Blank rows = not tested)                                                                                                                                               TODAYS TREATMENT: DATE:  05/18/23 Therex: Pulleys flexion, scaption 3 mins each way with Rt arm outstretched Wall ladder flexion, scaption x 10 reps each, Rt Isometrics: flexion, abduction, ER, extension 10 x 5 sec hold each on Rt Supine active flexion 2x10; Rt Supine shoulder circles in 90 deg flexion x 20 CCW/CW Sidelying Rt active abduction 2x10 Seated "drive the bus" without resistance x10 Rows L3 band 2x10   05/16/2023 Therex: Pulleys flexion, scaption 3 mins each way with Rt arm outstretched.  Isometric Rt shoulder flexion, abduction , ER sub max, painfree with arm at side 5 sec on/off x 12 each way Supine wand flexion AAROM 1 lb bar x 15 Supine wand flexion AROM bilaterally 2 x 10   Additional time in education and review of existing HEP.    05/11/23 See HEP - performed trial reps with mod cues for technique and comprehension    PATIENT EDUCATION:  05/16/2023 Education details: HEP update Person educated: Patient Education method: Explanation, Demonstration, and Handouts Education comprehension: verbalized understanding, returned demonstration, and needs further education  HOME EXERCISE PROGRAM: Access Code: [email or web address] URL: [email or web address] Date: 05/16/2023 Prepared by: Chyrel Masson  Exercises - Supine Shoulder Flexion with Dowel  - 3-5 x daily - 7 x weekly - 1 sets - 5-10 reps - 3-5 sec hold - Standing Shoulder Abduction ROM with Dowel  - 3-5 x daily - 7 x weekly - 1 sets - 5-10 reps - 3-5 sec hold - Standing Scapular Retraction  - 3-5 x daily - 7 x weekly - 1 sets - 5-10 reps - 5 sec hold - Standing Backward Shoulder Rolls  - 3-5 x daily - 7 x weekly - 1 sets - 5-10 reps - Isometric Shoulder Flexion at Wall  - 2 x daily - 7 x weekly - 1 sets - 10 reps - 5 hold - Isometric Shoulder External Rotation at Wall  - 2 x daily - 7 x  weekly - 1 sets - 10 reps - 5 hold - Isometric Shoulder Abduction at Wall  - 2 x daily - 7 x weekly - 1 sets - 10 reps - 5 hold   ASSESSMENT:  CLINICAL IMPRESSION: Pt tolerated session well today with increased activity and AROM exercises.  Expected fatigue noted after session.  Will continue to benefit from PT to maximize function.   OBJECTIVE IMPAIRMENTS: decreased ROM, decreased strength, increased fascial restrictions, increased muscle spasms, impaired UE functional use, postural dysfunction, and pain.   ACTIVITY LIMITATIONS: carrying, lifting, bathing, toileting, dressing, reach over head, and hygiene/grooming  PARTICIPATION LIMITATIONS: meal prep, cleaning,  laundry, driving, shopping, and community activity  PERSONAL FACTORS: 3+ comorbidities: OA, asthma, depression, HTN  are also affecting patient's functional outcome.   REHAB POTENTIAL: Excellent  CLINICAL DECISION MAKING: Evolving/moderate complexity  EVALUATION COMPLEXITY: Moderate   GOALS: Goals reviewed with patient? Yes  SHORT TERM GOALS: Target date: 06/08/2023  Independent with initial HEP Goal status: on going 05/16/2023  2.  Rt shoulder flexion and abduction AROM improved 10 deg from improved function and mobility Goal status: on going 05/16/2023   LONG TERM GOALS: Target date: 07/06/2023  Independent with final HEP Goal status: INITIAL  2.  FOTO score improved to 59 Goal status: INITIAL  3.  Rt shoulder AROM improved to [facility] in standing for improved function and ADLs Goal status: INIITAL  4.  Report pain < 2/10 with reaching overhead for improved function Goal status: INITIAL  5.  Report ability to sleep on side with pain </= 2/10 Goal status: INITIAL   PLAN:  PT FREQUENCY: 1-2x/week  PT DURATION: 8 weeks  PLANNED INTERVENTIONS: Therapeutic exercises, Therapeutic activity, Neuromuscular re-education, Patient/Family education, Self Care, Joint mobilization, Aquatic Therapy, Dry Needling,  Electrical stimulation, Cryotherapy, Moist heat, Taping, Vasopneumatic device, Ultrasound, Manual therapy, and Re-evaluation.  PLAN FOR NEXT SESSION: continue AAROM/ AROM progression as tolerated.  No IR loaded strengthening, no ER past 30 deg   NEXT MD VISIT: 06/08/23  Clarita Crane, PT, DPT 05/18/23 1:45 PM

## 2023-05-23 ENCOUNTER — Ambulatory Visit (INDEPENDENT_AMBULATORY_CARE_PROVIDER_SITE_OTHER): Payer: Medicare Other | Admitting: Physical Therapy

## 2023-05-23 ENCOUNTER — Encounter: Payer: Self-pay | Admitting: Physical Therapy

## 2023-05-23 DIAGNOSIS — R293 Abnormal posture: Secondary | ICD-10-CM | POA: Diagnosis not present

## 2023-05-23 DIAGNOSIS — M25611 Stiffness of right shoulder, not elsewhere classified: Secondary | ICD-10-CM | POA: Diagnosis not present

## 2023-05-23 DIAGNOSIS — M6281 Muscle weakness (generalized): Secondary | ICD-10-CM

## 2023-05-23 DIAGNOSIS — M25511 Pain in right shoulder: Secondary | ICD-10-CM | POA: Diagnosis not present

## 2023-05-23 NOTE — Therapy (Signed)
OUTPATIENT PHYSICAL THERAPY TREATMENT   Patient Name: Cassidy Garcia MRN: [id number] DOB:01-Mar-1946, 77 y.o., female Today's Date: 05/23/2023  END OF SESSION:  PT End of Session - 05/23/23 1358     Visit Number 4    Number of Visits 16    Date for PT Re-Evaluation 07/06/23    Authorization Type Medicare    Progress Note Due on Visit 10    PT Start Time 1356   pt arrived late   PT Stop Time 1426    PT Time Calculation (min) 30 min    Activity Tolerance Patient tolerated treatment well    Behavior During Therapy [facility] for tasks assessed/performed                Past Medical History:  Diagnosis Date   Arthritis    Asthma    Bronchitis 03/2016   Chronic kidney disease    stage 3   Colon polyps    tubular adenoma   Constipation    Depression    pt denies   Diverticulosis    GERD (gastroesophageal reflux disease)    Hemorrhoids    History of hiatal hernia    Hyperlipidemia    Hypertension    Hypothyroidism    Shortness of breath    with exertion on occasion   Sinusitis    Skin cancer    Past Surgical History:  Procedure Laterality Date   ABDOMINAL HYSTERECTOMY     BICEPT TENODESIS Right 04/26/2023   Procedure: RIGHT BICEPS TENODESIS;  Surgeon: Cammy Copa, MD;  Location: [facility] OR;  Service: Orthopedics;  Laterality: Right;   CARPOMETACARPAL (CMC) FUSION OF THUMB Bilateral    CATARACT EXTRACTION, BILATERAL     CHOLECYSTECTOMY     COLONOSCOPY  02/12/2011   HAMMER TOE SURGERY Right    LAPAROSCOPIC APPENDECTOMY N/A 08/26/2021   Procedure: APPENDECTOMY LAPAROSCOPIC WITH LYSIS OF ADHESIONS;  Surgeon: Karie Soda, MD;  Location: [facility] ORS;  Service: General;  Laterality: N/A;   NASAL SINUS SURGERY     POLYPECTOMY     REVERSE SHOULDER ARTHROPLASTY Right 04/26/2023   Procedure: RIGHT REVERSE SHOULDER ARTHROPLASTY;  Surgeon: Cammy Copa, MD;  Location: [facility] OR;  Service: Orthopedics;  Laterality: Right;   TOTAL HIP ARTHROPLASTY  12/10/2011   Procedure: TOTAL  HIP ARTHROPLASTY ANTERIOR APPROACH;  Surgeon: Kathryne Hitch, MD;  Location: [facility] ORS;  Service: Orthopedics;  Laterality: Right;  Right Total Hip Arthroplasty, Anterior Approach   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   Rotator cuff tendonitis, right 07/02/2022   Nausea 12/02/2021   Bright red rectal bleeding 09/15/2021   History of colonic polyps 07/13/2021   Gastroenteritis 09/09/2020   Anemia 03/06/2020   Hypothyroidism 02/04/2020   Depression 01/24/2020   Asthma 01/24/2020   CKD (chronic kidney disease), stage III (HCC) 01/24/2020   Acute on chronic renal failure (HCC) 01/24/2020   Constipation 12/20/2018   Pain in right hip 06/29/2017   Trochanteric bursitis, right hip 06/29/2017   History of total replacement of right hip 06/29/2017   Hemorrhoids 03/01/2017   Great toe pain, left 12/04/2016   Chronic right shoulder pain 12/04/2016   Non-toxic multinodular goiter 05/14/2013   Degenerative arthritis of hip 12/10/2011   HTN (hypertension) 07/06/2011   Hyperlipidemia 07/06/2011   Gastro-esophageal reflux disease without esophagitis 09/23/2009    PCP:  Geoffry Paradise, MD  REFERRING PROVIDER: Cammy Copa, MD  REFERRING DIAG: [phone] (ICD-10-CM) - Arthritis of right shoulder region Z96.611 (ICD-10-CM) - S/P reverse total shoulder arthroplasty, right  Rationale for Evaluation and Treatment: Rehabilitation  THERAPY DIAG:  Acute pain of right shoulder  Stiffness of right shoulder, not elsewhere classified  Muscle weakness (generalized)  Abnormal posture  ONSET DATE: DOS: 04/26/23   SUBJECTIVE:                                                                                                                                                                                           SUBJECTIVE  STATEMENT: Starting to use her arm more so she's having some more pain but not back.  PERTINENT HISTORY:  OA, asthma, depression, HTN  PAIN:   NPRS scale: no pain upon arrival/10 Pain location: Rt shoulder Pain description: ache, dull Aggravating factors: CPM near 90 deg Relieving factors: nothing  PRECAUTIONS:  Shoulder: P/AROM, no ER > 30 deg, okay for deltoid isometrics, no lifting anything heavier than a cup of coffee or cell phone  RED FLAGS: None   WEIGHT BEARING RESTRICTIONS:  No  FALLS:  Has patient fallen in last 6 months? No  LIVING ENVIRONMENT: Lives with: lives alone  OCCUPATION:  Retired  PLOF:  Independent and Leisure: watch TV, puzzles  PATIENT GOALS:  Improved ROM and strength; be able to put dishes and clothes away; reach behind back   OBJECTIVE:   PATIENT SURVEYS:  05/11/23 FOTO 44 (predicted 59)  COGNITIVE STATUS: 05/11/23 Within functional limits for tasks assessed   SENSATION: 05/11/23 WFL  POSTURE:  05/11/23 rounded shoulders and forward head  HAND DOMINANCE:  05/11/23 Right  GAIT: 05/11/23 Comments: independent  UPPER EXTREMITY ROM:  A/P ROM Right 05/11/23 Right 05/16/2023  Shoulder flexion A: 112 P: 122 (Supine) AROM 140 deg  Shoulder extension    Shoulder abduction A: 97 P: 102 (Supine)   Shoulder adduction    Shoulder extension    Shoulder internal rotation    Shoulder external rotation P: 25 (Supine)    (Blank rows = not tested)   UPPER EXTREMITY MMT:    05/11/23: deferred due to post-op status  MMT Right eval Left eval  Shoulder flexion    Shoulder extension    Shoulder abduction    Shoulder adduction    Shoulder extension    Shoulder internal rotation    Shoulder external rotation    Middle trapezius    Lower trapezius    Elbow flexion    Elbow extension    Wrist flexion    Wrist extension    Wrist ulnar  deviation    Wrist radial deviation    Wrist pronation    Wrist supination    Grip  strength     (Blank rows = not tested)                                                                                                                                              TODAYS TREATMENT: DATE:  05/23/23 Therex: Pulleys flexion, scaption 3 mins each way with Rt arm outstretched Wall ladder flexion, scaption x 10 reps each, Rt with 1# wrist weight Supine active flexion 2x10; Rt; 1/4# Supine shoulder circles in 90 deg flexion x 20 CCW/CW Sidelying Rt active abduction 2x10; 1/4#  05/18/23 Therex: Pulleys flexion, scaption 3 mins each way with Rt arm outstretched Wall ladder flexion, scaption x 10 reps each, Rt Isometrics: flexion, abduction, ER, extension 10 x 5 sec hold each on Rt Supine active flexion 2x10; Rt Supine shoulder circles in 90 deg flexion x 20 CCW/CW Sidelying Rt active abduction 2x10 Seated "drive the bus" without resistance x10 Rows L3 band 2x10   05/16/2023 Therex: Pulleys flexion, scaption 3 mins each way with Rt arm outstretched.  Isometric Rt shoulder flexion, abduction , ER sub max, painfree with arm at side 5 sec on/off x 12 each way Supine wand flexion AAROM 1 lb bar x 15 Supine wand flexion AROM bilaterally 2 x 10   Additional time in education and review of existing HEP.    05/11/23 See HEP - performed trial reps with mod cues for technique and comprehension    PATIENT EDUCATION:  05/16/2023 Education details: HEP update Person educated: Patient Education method: Explanation, Demonstration, and Handouts Education comprehension: verbalized understanding, returned demonstration, and needs further education  HOME EXERCISE PROGRAM: Access Code: [email or web address] URL: [email or web address] Date: 05/16/2023 Prepared by: Chyrel Masson  Exercises - Supine Shoulder Flexion with Dowel  - 3-5 x daily - 7 x weekly - 1 sets - 5-10 reps - 3-5 sec hold - Standing Shoulder Abduction ROM with Dowel  - 3-5 x daily - 7 x weekly - 1 sets - 5-10  reps - 3-5 sec hold - Standing Scapular Retraction  - 3-5 x daily - 7 x weekly - 1 sets - 5-10 reps - 5 sec hold - Standing Backward Shoulder Rolls  - 3-5 x daily - 7 x weekly - 1 sets - 5-10 reps - Isometric Shoulder Flexion at Wall  - 2 x daily - 7 x weekly - 1 sets - 10 reps - 5 hold - Isometric Shoulder External Rotation at Wall  - 2 x daily - 7 x weekly - 1 sets - 10 reps - 5 hold - Isometric Shoulder Abduction at Wall  - 2 x daily - 7 x weekly - 1 sets - 10 reps - 5 hold   ASSESSMENT:  CLINICAL IMPRESSION: Pt tolerated session well with  progression of weight as allowed post op.  Will continue to benefit from PT to maximize function.  OBJECTIVE IMPAIRMENTS: decreased ROM, decreased strength, increased fascial restrictions, increased muscle spasms, impaired UE functional use, postural dysfunction, and pain.   ACTIVITY LIMITATIONS: carrying, lifting, bathing, toileting, dressing, reach over head, and hygiene/grooming  PARTICIPATION LIMITATIONS: meal prep, cleaning, laundry, driving, shopping, and community activity  PERSONAL FACTORS: 3+ comorbidities: OA, asthma, depression, HTN  are also affecting patient's functional outcome.   REHAB POTENTIAL: Excellent  CLINICAL DECISION MAKING: Evolving/moderate complexity  EVALUATION COMPLEXITY: Moderate   GOALS: Goals reviewed with patient? Yes  SHORT TERM GOALS: Target date: 06/08/2023  Independent with initial HEP Goal status: on going 05/16/2023  2.  Rt shoulder flexion and abduction AROM improved 10 deg from improved function and mobility Goal status: on going 05/16/2023   LONG TERM GOALS: Target date: 07/06/2023  Independent with final HEP Goal status: INITIAL  2.  FOTO score improved to 59 Goal status: INITIAL  3.  Rt shoulder AROM improved to [facility] in standing for improved function and ADLs Goal status: INIITAL  4.  Report pain < 2/10 with reaching overhead for improved function Goal status: INITIAL  5.  Report  ability to sleep on side with pain </= 2/10 Goal status: INITIAL   PLAN:  PT FREQUENCY: 1-2x/week  PT DURATION: 8 weeks  PLANNED INTERVENTIONS: Therapeutic exercises, Therapeutic activity, Neuromuscular re-education, Patient/Family education, Self Care, Joint mobilization, Aquatic Therapy, Dry Needling, Electrical stimulation, Cryotherapy, Moist heat, Taping, Vasopneumatic device, Ultrasound, Manual therapy, and Re-evaluation.  PLAN FOR NEXT SESSION: measure AROM,  continue AAROM/ AROM progression as tolerated.  No IR loaded strengthening, no ER past 30 deg   NEXT MD VISIT: 06/08/23  Clarita Crane, PT, DPT 05/23/23 2:30 PM

## 2023-05-24 ENCOUNTER — Encounter: Payer: Self-pay | Admitting: Rehabilitative and Restorative Service Providers"

## 2023-05-24 ENCOUNTER — Ambulatory Visit (INDEPENDENT_AMBULATORY_CARE_PROVIDER_SITE_OTHER): Payer: Medicare Other | Admitting: Rehabilitative and Restorative Service Providers"

## 2023-05-24 DIAGNOSIS — M25511 Pain in right shoulder: Secondary | ICD-10-CM | POA: Diagnosis not present

## 2023-05-24 DIAGNOSIS — M25611 Stiffness of right shoulder, not elsewhere classified: Secondary | ICD-10-CM | POA: Diagnosis not present

## 2023-05-24 DIAGNOSIS — R293 Abnormal posture: Secondary | ICD-10-CM

## 2023-05-24 DIAGNOSIS — M6281 Muscle weakness (generalized): Secondary | ICD-10-CM | POA: Diagnosis not present

## 2023-05-24 NOTE — Therapy (Signed)
OUTPATIENT PHYSICAL THERAPY TREATMENT   Patient Name: Cassidy Garcia MRN: [id number] DOB:[birth year], 77 y.o., female Today's Date: 05/24/2023  END OF SESSION:  PT End of Session - 05/24/23 1424     Visit Number 5    Number of Visits 16    Date for PT Re-Evaluation 07/06/23    Authorization Type Medicare    Progress Note Due on Visit 10    PT Start Time 1429    PT Stop Time 1507    PT Time Calculation (min) 38 min    Activity Tolerance Patient limited by pain    Behavior During Therapy [facility] for tasks assessed/performed                 Past Medical History:  Diagnosis Date   Arthritis    Asthma    Bronchitis 03/2016   Chronic kidney disease    stage 3   Colon polyps    tubular adenoma   Constipation    Depression    pt denies   Diverticulosis    GERD (gastroesophageal reflux disease)    Hemorrhoids    History of hiatal hernia    Hyperlipidemia    Hypertension    Hypothyroidism    Shortness of breath    with exertion on occasion   Sinusitis    Skin cancer    Past Surgical History:  Procedure Laterality Date   ABDOMINAL HYSTERECTOMY     BICEPT TENODESIS Right 04/26/2023   Procedure: RIGHT BICEPS TENODESIS;  Surgeon: Cammy Copa, MD;  Location: [facility] OR;  Service: Orthopedics;  Laterality: Right;   CARPOMETACARPAL (CMC) FUSION OF THUMB Bilateral    CATARACT EXTRACTION, BILATERAL     CHOLECYSTECTOMY     COLONOSCOPY  02/12/2011   HAMMER TOE SURGERY Right    LAPAROSCOPIC APPENDECTOMY N/A 08/26/2021   Procedure: APPENDECTOMY LAPAROSCOPIC WITH LYSIS OF ADHESIONS;  Surgeon: Karie Soda, MD;  Location: [facility];  Service: General;  Laterality: N/A;   NASAL SINUS SURGERY     POLYPECTOMY     REVERSE SHOULDER ARTHROPLASTY Right 04/26/2023   Procedure: RIGHT REVERSE SHOULDER ARTHROPLASTY;  Surgeon: Cammy Copa, MD;  Location: [facility] OR;  Service: Orthopedics;  Laterality: Right;   TOTAL HIP ARTHROPLASTY  12/10/2011   Procedure: TOTAL HIP ARTHROPLASTY ANTERIOR  APPROACH;  Surgeon: Kathryne Hitch, MD;  Location: [facility];  Service: Orthopedics;  Laterality: Right;  Right Total Hip Arthroplasty, Anterior Approach   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   Rotator cuff tendonitis, right 07/02/2022   Nausea 12/02/2021   Bright red rectal bleeding 09/15/2021   History of colonic polyps 07/13/2021   Gastroenteritis 09/09/2020   Anemia 03/06/2020   Hypothyroidism 02/04/2020   Depression 01/24/2020   Asthma 01/24/2020   CKD (chronic kidney disease), stage III (HCC) 01/24/2020   Acute on chronic renal failure (HCC) 01/24/2020   Constipation 12/20/2018   Pain in right hip 06/29/2017   Trochanteric bursitis, right hip 06/29/2017   History of total replacement of right hip 06/29/2017   Hemorrhoids 03/01/2017   Great toe pain, left 12/04/2016   Chronic right shoulder pain 12/04/2016   Non-toxic multinodular goiter 05/14/2013   Degenerative arthritis of hip 12/10/2011   HTN (hypertension) 07/06/2011   Hyperlipidemia 07/06/2011   Gastro-esophageal reflux disease without esophagitis 09/23/2009    PCP: Geoffry Paradise, MD  REFERRING PROVIDER: Cammy Copa, MD  REFERRING DIAG: [phone] (ICD-10-CM) - Arthritis of right shoulder region Z96.611 (ICD-10-CM) - S/P reverse total shoulder arthroplasty, right  Rationale for Evaluation and Treatment: Rehabilitation  THERAPY DIAG:  Acute pain of right shoulder  Stiffness of right shoulder, not elsewhere classified  Muscle weakness (generalized)  Abnormal posture  ONSET DATE: DOS: 04/26/23   SUBJECTIVE:                                                                                                                                                                                           SUBJECTIVE STATEMENT: Pt indicated 3/10 or  so pain prior to adjust to avoid cat which jarred arm, hurt to 5/10 but lowering.   PERTINENT HISTORY:  OA, asthma, depression, HTN  PAIN:   NPRS scale: 3-5/10 today Pain location: Rt shoulder Pain description: ache, dull Aggravating factors: jarring from quick movement to avoid cait.   Relieving factors: nothing  PRECAUTIONS:  Shoulder: P/AROM, no ER > 30 deg, okay for deltoid isometrics, no lifting anything heavier than a cup of coffee or cell phone  RED FLAGS: None   WEIGHT BEARING RESTRICTIONS:  No  FALLS:  Has patient fallen in last 6 months? No  LIVING ENVIRONMENT: Lives with: lives alone  OCCUPATION:  Retired  PLOF:  Independent and Leisure: watch TV, puzzles  PATIENT GOALS:  Improved ROM and strength; be able to put dishes and clothes away; reach behind back   OBJECTIVE:   PATIENT SURVEYS:  05/11/23 FOTO 44 (predicted 59)  COGNITIVE STATUS: 05/11/23 Within functional limits for tasks assessed   SENSATION: 05/11/23 WFL  POSTURE:  05/11/23 rounded shoulders and forward head  HAND DOMINANCE:  05/11/23 Right  GAIT: 05/11/23 Comments: independent  UPPER EXTREMITY ROM:  A/P ROM Right 05/11/23 Right 05/16/2023 Right 05/24/2023  Shoulder flexion A: 112 P: 122 (Supine) AROM 140 deg 146 AROM in supine   Shoulder extension     Shoulder abduction A: 97 P: 102 (Supine)  110 AROM in supine 115 PROM in supine  Shoulder adduction     Shoulder extension     Shoulder internal rotation     Shoulder external rotation P: 25 (Supine)     (Blank rows = not tested)   UPPER EXTREMITY MMT:    05/11/23: deferred due to post-op status  MMT Right eval Left eval  Shoulder flexion    Shoulder extension    Shoulder abduction    Shoulder adduction    Shoulder extension    Shoulder internal rotation    Shoulder external rotation    Middle trapezius    Lower trapezius  Elbow flexion    Elbow extension    Wrist flexion    Wrist extension     Wrist ulnar deviation    Wrist radial deviation    Wrist pronation    Wrist supination    Grip strength     (Blank rows = not tested)                                                                                                                                              TODAYS TREATMENT:                                                                        DATE: 05/24/2023 Therex: UBE fwd/back 3 mins with 1 min rest between directions.  Lvl 2.0 Supine Rt shoulder AROM 2 x 15  - no weight due to increased arrival symptoms Supine Rt shoulder 90 deg flexion circles small x 30 clockwise, x 30 counterclockwise Lt sidelying Rt shoulder AROM abduction 2 x 15  Manual Rt shoulder PROM, g2 inferior/superior jt mobs in flexion, scaption, abduction for range improvement/pain relief.    TODAYS TREATMENT:                                                                        DATE: 05/23/2023 Therex: Pulleys flexion, scaption 3 mins each way with Rt arm outstretched Wall ladder flexion, scaption x 10 reps each, Rt with 1# wrist weight Supine active flexion 2x10; Rt; 1/4# Supine shoulder circles in 90 deg flexion x 20 CCW/CW Sidelying Rt active abduction 2x10; 1/4#  TODAYS TREATMENT:                                                                        DATE:05/18/2023 Therex: Pulleys flexion, scaption 3 mins each way with Rt arm outstretched Wall ladder flexion, scaption x 10 reps each, Rt Isometrics: flexion, abduction, ER, extension 10 x 5 sec hold each on Rt Supine active flexion 2x10; Rt Supine shoulder circles in 90 deg flexion x 20 CCW/CW Sidelying Rt active abduction 2x10 Seated "drive the bus" without resistance  x10 Rows L3 band 2x10   TODAYS TREATMENT:                                                                        DATE:05/16/2023 Therex: Pulleys flexion, scaption 3 mins each way with Rt arm outstretched.  Isometric Rt shoulder flexion, abduction , ER sub max, painfree  with arm at side 5 sec on/off x 12 each way Supine wand flexion AAROM 1 lb bar x 15 Supine wand flexion AROM bilaterally 2 x 10   Additional time in education and review of existing HEP.     PATIENT EDUCATION:  05/16/2023 Education details: HEP update Person educated: Patient Education method: Explanation, Demonstration, and Handouts Education comprehension: verbalized understanding, returned demonstration, and needs further education  HOME EXERCISE PROGRAM: Access Code: [email or web address] URL: [email or web address] Date: 05/16/2023 Prepared by: Chyrel Masson  Exercises - Supine Shoulder Flexion with Dowel  - 3-5 x daily - 7 x weekly - 1 sets - 5-10 reps - 3-5 sec hold - Standing Shoulder Abduction ROM with Dowel  - 3-5 x daily - 7 x weekly - 1 sets - 5-10 reps - 3-5 sec hold - Standing Scapular Retraction  - 3-5 x daily - 7 x weekly - 1 sets - 5-10 reps - 5 sec hold - Standing Backward Shoulder Rolls  - 3-5 x daily - 7 x weekly - 1 sets - 5-10 reps - Isometric Shoulder Flexion at Wall  - 2 x daily - 7 x weekly - 1 sets - 10 reps - 5 hold - Isometric Shoulder External Rotation at Wall  - 2 x daily - 7 x weekly - 1 sets - 10 reps - 5 hold - Isometric Shoulder Abduction at Wall  - 2 x daily - 7 x weekly - 1 sets - 10 reps - 5 hold   ASSESSMENT:  CLINICAL IMPRESSION: Limited activity progression today in part due to symptoms increase noted after jarring of shoulder when moving to avoid cat.  ROM continued to show well overall.  Pt to benefit from progressive AROM gains and strengthening as tolerated.   OBJECTIVE IMPAIRMENTS: decreased ROM, decreased strength, increased fascial restrictions, increased muscle spasms, impaired UE functional use, postural dysfunction, and pain.   ACTIVITY LIMITATIONS: carrying, lifting, bathing, toileting, dressing, reach over head, and hygiene/grooming  PARTICIPATION LIMITATIONS: meal prep, cleaning, laundry, driving, shopping, and community  activity  PERSONAL FACTORS: 3+ comorbidities: OA, asthma, depression, HTN  are also affecting patient's functional outcome.   REHAB POTENTIAL: Excellent  CLINICAL DECISION MAKING: Evolving/moderate complexity  EVALUATION COMPLEXITY: Moderate   GOALS: Goals reviewed with patient? Yes  SHORT TERM GOALS: Target date: 06/08/2023  Independent with initial HEP Goal status: on going 05/16/2023  2.  Rt shoulder flexion and abduction AROM improved 10 deg from improved function and mobility Goal status: on going 05/16/2023   LONG TERM GOALS: Target date: 07/06/2023  Independent with final HEP Goal status: INITIAL  2.  FOTO score improved to 59 Goal status: INITIAL  3.  Rt shoulder AROM improved to [place] in standing for improved function and ADLs Goal status: INIITAL  4.  Report pain < 2/10 with reaching overhead for improved function Goal status: INITIAL  5.  Report ability to sleep  on side with pain </= 2/10 Goal status: INITIAL   PLAN:  PT FREQUENCY: 1-2x/week  PT DURATION: 8 weeks  PLANNED INTERVENTIONS: Therapeutic exercises, Therapeutic activity, Neuromuscular re-education, Patient/Family education, Self Care, Joint mobilization, Aquatic Therapy, Dry Needling, Electrical stimulation, Cryotherapy, Moist heat, Taping, Vasopneumatic device, Ultrasound, Manual therapy, and Re-evaluation.  PLAN FOR NEXT SESSION :    AROM, progressive strengthening as able.   No IR loaded strengthening, no ER past 30 deg   NEXT MD VISIT: 06/08/23  Chyrel Masson, PT, DPT, OCS, ATC 05/24/23  3:10 PM

## 2023-05-30 ENCOUNTER — Ambulatory Visit (INDEPENDENT_AMBULATORY_CARE_PROVIDER_SITE_OTHER): Payer: Medicare Other | Admitting: Physical Therapy

## 2023-05-30 ENCOUNTER — Encounter: Payer: Self-pay | Admitting: Physical Therapy

## 2023-05-30 DIAGNOSIS — M6281 Muscle weakness (generalized): Secondary | ICD-10-CM

## 2023-05-30 DIAGNOSIS — M25511 Pain in right shoulder: Secondary | ICD-10-CM

## 2023-05-30 DIAGNOSIS — M25611 Stiffness of right shoulder, not elsewhere classified: Secondary | ICD-10-CM | POA: Diagnosis not present

## 2023-05-30 DIAGNOSIS — R293 Abnormal posture: Secondary | ICD-10-CM | POA: Diagnosis not present

## 2023-05-30 NOTE — Therapy (Signed)
OUTPATIENT PHYSICAL THERAPY TREATMENT   Patient Name: Cassidy Garcia MRN: [id number] DOB:07-01-1946, 77 y.o., female Today's Date: 05/30/2023  END OF SESSION:  PT End of Session - 05/30/23 1433     Visit Number 6    Number of Visits 16    Date for PT Re-Evaluation 07/06/23    Authorization Type Medicare    Progress Note Due on Visit 10    PT Start Time 1430    PT Stop Time 1510    PT Time Calculation (min) 40 min    Activity Tolerance Patient limited by pain    Behavior During Therapy [facility] for tasks assessed/performed                  Past Medical History:  Diagnosis Date   Arthritis    Asthma    Bronchitis 03/2016   Chronic kidney disease    stage 3   Colon polyps    tubular adenoma   Constipation    Depression    pt denies   Diverticulosis    GERD (gastroesophageal reflux disease)    Hemorrhoids    History of hiatal hernia    Hyperlipidemia    Hypertension    Hypothyroidism    Shortness of breath    with exertion on occasion   Sinusitis    Skin cancer    Past Surgical History:  Procedure Laterality Date   ABDOMINAL HYSTERECTOMY     BICEPT TENODESIS Right 04/26/2023   Procedure: RIGHT BICEPS TENODESIS;  Surgeon: Cammy Copa, MD;  Location: [facility] OR;  Service: Orthopedics;  Laterality: Right;   CARPOMETACARPAL (CMC) FUSION OF THUMB Bilateral    CATARACT EXTRACTION, BILATERAL     CHOLECYSTECTOMY     COLONOSCOPY  02/12/2011   HAMMER TOE SURGERY Right    LAPAROSCOPIC APPENDECTOMY N/A 08/26/2021   Procedure: APPENDECTOMY LAPAROSCOPIC WITH LYSIS OF ADHESIONS;  Surgeon: Karie Soda, MD;  Location: [facility]S;  Service: General;  Laterality: N/A;   NASAL SINUS SURGERY     POLYPECTOMY     REVERSE SHOULDER ARTHROPLASTY Right 04/26/2023   Procedure: RIGHT REVERSE SHOULDER ARTHROPLASTY;  Surgeon: Cammy Copa, MD;  Location: [facility] OR;  Service: Orthopedics;  Laterality: Right;   TOTAL HIP ARTHROPLASTY  12/10/2011   Procedure: TOTAL HIP ARTHROPLASTY  ANTERIOR APPROACH;  Surgeon: Kathryne Hitch, MD;  Location: [facility]S;  Service: Orthopedics;  Laterality: Right;  Right Total Hip Arthroplasty, Anterior Approach   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   Rotator cuff tendonitis, right 07/02/2022   Nausea 12/02/2021   Bright red rectal bleeding 09/15/2021   History of colonic polyps 07/13/2021   Gastroenteritis 09/09/2020   Anemia 03/06/2020   Hypothyroidism 02/04/2020   Depression 01/24/2020   Asthma 01/24/2020   CKD (chronic kidney disease), stage III (HCC) 01/24/2020   Acute on chronic renal failure (HCC) 01/24/2020   Constipation 12/20/2018   Pain in right hip 06/29/2017   Trochanteric bursitis, right hip 06/29/2017   History of total replacement of right hip 06/29/2017   Hemorrhoids 03/01/2017   Great toe pain, left 12/04/2016   Chronic right shoulder pain 12/04/2016   Non-toxic multinodular goiter 05/14/2013   Degenerative arthritis of hip 12/10/2011   HTN (hypertension) 07/06/2011   Hyperlipidemia 07/06/2011   Gastro-esophageal reflux disease without esophagitis 09/23/2009    PCP: Geoffry Paradise,  MD  REFERRING PROVIDER: Cammy Copa, MD  REFERRING DIAG: [phone] (ICD-10-CM) - Arthritis of right shoulder region Z96.611 (ICD-10-CM) - S/P reverse total shoulder arthroplasty, right  Rationale for Evaluation and Treatment: Rehabilitation  THERAPY DIAG:  Acute pain of right shoulder  Stiffness of right shoulder, not elsewhere classified  Muscle weakness (generalized)  Abnormal posture  ONSET DATE: DOS: 04/26/23   SUBJECTIVE:                                                                                                                                                                                           SUBJECTIVE STATEMENT: Keeps  aggravating her shoulder with some reaching out to the side and pulling arm back in  PERTINENT HISTORY:  OA, asthma, depression, HTN  PAIN:   NPRS scale: 3-5/10 today Pain location: Rt shoulder Pain description: ache, dull Aggravating factors: jarring from quick movement to avoid cait.   Relieving factors: nothing  PRECAUTIONS:  Shoulder: P/AROM, no ER > 30 deg, okay for deltoid isometrics, no lifting anything heavier than a cup of coffee or cell phone  RED FLAGS: None   WEIGHT BEARING RESTRICTIONS:  No  FALLS:  Has patient fallen in last 6 months? No  LIVING ENVIRONMENT: Lives with: lives alone  OCCUPATION:  Retired  PLOF:  Independent and Leisure: watch TV, puzzles  PATIENT GOALS:  Improved ROM and strength; be able to put dishes and clothes away; reach behind back   OBJECTIVE:   PATIENT SURVEYS:  05/11/23 FOTO 44 (predicted 59)  COGNITIVE STATUS: 05/11/23 Within functional limits for tasks assessed   SENSATION: 05/11/23 WFL  POSTURE:  05/11/23 rounded shoulders and forward head  HAND DOMINANCE:  05/11/23 Right  GAIT: 05/11/23 Comments: independent  UPPER EXTREMITY ROM:  A/P ROM Right 05/11/23 Right 05/16/2023 Right 05/24/2023 Right 05/30/23  Shoulder flexion A: 112 P: 122 (Supine) AROM 140 deg 146 AROM in supine  A: 130 in standing  Shoulder extension      Shoulder abduction A: 97 P: 102 (Supine)  110 AROM in supine 115 PROM in supine   Shoulder adduction      Shoulder extension      Shoulder internal rotation      Shoulder external rotation P: 25 (Supine)      (Blank rows = not tested)   UPPER EXTREMITY MMT:    05/11/23: deferred due to post-op status  MMT Right eval Left eval  Shoulder flexion    Shoulder extension    Shoulder abduction    Shoulder adduction    Shoulder extension    Shoulder internal rotation    Shoulder external rotation  Middle trapezius    Lower trapezius    Elbow flexion    Elbow extension     Wrist flexion    Wrist extension    Wrist ulnar deviation    Wrist radial deviation    Wrist pronation    Wrist supination    Grip strength     (Blank rows = not tested)                                                                                                                                              TODAYS TREATMENT: DATE:  05/30/23 TherEx UBE L2 x 6 min (3 min each direction) Supine active flexion 2x15; Rt; 1/4# Supine Rt shoulder 90 deg flexion circles small x 30 clockwise, x 30 counterclockwise with 1/4# weight Lt sidelying Rt shoulder AROM abduction 2 x 15; 1/4# with second set Rt shoulder L2 band IR reactive isometrics 2x10 Rows L2 band 2x10 Standing active shoulder flexion 2x10 on Rt Standing Rt shoulder active abduction 2x10 on Rt Standing Rt shoulder IR isometrics 5 sec 2x10, moved to sitting due to back pain    05/24/2023 Therex: UBE fwd/back 3 mins with 1 min rest between directions.  Lvl 2.0 Supine Rt shoulder AROM 2 x 15  - no weight due to increased arrival symptoms Supine Rt shoulder 90 deg flexion circles small x 30 clockwise, x 30 counterclockwise Lt sidelying Rt shoulder AROM abduction 2 x 15  Manual Rt shoulder PROM, g2 inferior/superior jt mobs in flexion, scaption, abduction for range improvement/pain relief.    05/23/2023 Therex: Pulleys flexion, scaption 3 mins each way with Rt arm outstretched Wall ladder flexion, scaption x 10 reps each, Rt with 1# wrist weight Supine active flexion 2x10; Rt; 1/4# Supine shoulder circles in 90 deg flexion x 20 CCW/CW Sidelying Rt active abduction 2x10; 1/4#  05/18/2023 Therex: Pulleys flexion, scaption 3 mins each way with Rt arm outstretched Wall ladder flexion, scaption x 10 reps each, Rt Isometrics: flexion, abduction, ER, extension 10 x 5 sec hold each on Rt Supine active flexion 2x10; Rt Supine shoulder circles in 90 deg flexion x 20 CCW/CW Sidelying Rt active abduction 2x10 Seated "drive the  bus" without resistance x10 Rows L3 band 2x10    PATIENT EDUCATION:  05/16/2023 Education details: HEP update Person educated: Patient Education method: Programmer, multimedia, Demonstration, and Handouts Education comprehension: verbalized understanding, returned demonstration, and needs further education  HOME EXERCISE PROGRAM: Access Code: [email or web address] URL: [email or web address] Date: 05/30/2023 Prepared by: Moshe Cipro  Exercises - Supine Shoulder Flexion with Dowel  - 3-5 x daily - 7 x weekly - 1 sets - 5-10 reps - 3-5 sec hold - Standing Shoulder Abduction ROM with Dowel  - 3-5 x daily - 7 x weekly - 1 sets - 5-10 reps - 3-5 sec hold - Standing Scapular Retraction  - 3-5 x daily - 7  x weekly - 1 sets - 5-10 reps - 5 sec hold - Standing Backward Shoulder Rolls  - 3-5 x daily - 7 x weekly - 1 sets - 5-10 reps - Isometric Shoulder Flexion at Wall (Mirrored)  - 2 x daily - 7 x weekly - 1 sets - 10 reps - 5 hold - Isometric Shoulder External Rotation at Wall (Mirrored)  - 2 x daily - 7 x weekly - 1 sets - 10 reps - 5 hold - Isometric Shoulder Abduction at Wall (Mirrored)  - 2 x daily - 7 x weekly - 1 sets - 10 reps - 5 hold - Standing Isometric Shoulder Internal Rotation at Doorway  - 2 x daily - 7 x weekly - 1 sets - 10 reps - 5 sec hold   ASSESSMENT:  CLINICAL IMPRESSION: Good improvement in AROM in standing today and able to progress exercises lightly.  Will continue to benefit from PT to maximize function.  OBJECTIVE IMPAIRMENTS: decreased ROM, decreased strength, increased fascial restrictions, increased muscle spasms, impaired UE functional use, postural dysfunction, and pain.   ACTIVITY LIMITATIONS: carrying, lifting, bathing, toileting, dressing, reach over head, and hygiene/grooming  PARTICIPATION LIMITATIONS: meal prep, cleaning, laundry, driving, shopping, and community activity  PERSONAL FACTORS: 3+ comorbidities: OA, asthma, depression, HTN  are also  affecting patient's functional outcome.   REHAB POTENTIAL: Excellent  CLINICAL DECISION MAKING: Evolving/moderate complexity  EVALUATION COMPLEXITY: Moderate   GOALS: Goals reviewed with patient? Yes  SHORT TERM GOALS: Target date: 06/08/2023  Independent with initial HEP Goal status: on going 05/16/2023  2.  Rt shoulder flexion and abduction AROM improved 10 deg from improved function and mobility Goal status: on going 05/16/2023   LONG TERM GOALS: Target date: 07/06/2023  Independent with final HEP Goal status: INITIAL  2.  FOTO score improved to 59 Goal status: INITIAL  3.  Rt shoulder AROM improved to [facility] in standing for improved function and ADLs Goal status: INIITAL  4.  Report pain < 2/10 with reaching overhead for improved function Goal status: INITIAL  5.  Report ability to sleep on side with pain </= 2/10 Goal status: INITIAL   PLAN:  PT FREQUENCY: 1-2x/week  PT DURATION: 8 weeks  PLANNED INTERVENTIONS: Therapeutic exercises, Therapeutic activity, Neuromuscular re-education, Patient/Family education, Self Care, Joint mobilization, Aquatic Therapy, Dry Needling, Electrical stimulation, Cryotherapy, Moist heat, Taping, Vasopneumatic device, Ultrasound, Manual therapy, and Re-evaluation.  PLAN FOR NEXT SESSION :   continue AROM, progressive strengthening as able.   No IR loaded strengthening, no ER past 30 deg   NEXT MD VISIT: 06/08/23    Clarita Crane, PT, DPT 05/30/23 3:11 PM

## 2023-06-01 ENCOUNTER — Encounter: Payer: Medicare Other | Admitting: Rehabilitative and Restorative Service Providers"

## 2023-06-06 ENCOUNTER — Ambulatory Visit (INDEPENDENT_AMBULATORY_CARE_PROVIDER_SITE_OTHER): Payer: Medicare Other | Admitting: Physical Therapy

## 2023-06-06 ENCOUNTER — Encounter: Payer: Self-pay | Admitting: Physical Therapy

## 2023-06-06 DIAGNOSIS — M6281 Muscle weakness (generalized): Secondary | ICD-10-CM | POA: Diagnosis not present

## 2023-06-06 DIAGNOSIS — G8929 Other chronic pain: Secondary | ICD-10-CM

## 2023-06-06 DIAGNOSIS — M25611 Stiffness of right shoulder, not elsewhere classified: Secondary | ICD-10-CM

## 2023-06-06 DIAGNOSIS — M25511 Pain in right shoulder: Secondary | ICD-10-CM | POA: Diagnosis not present

## 2023-06-06 DIAGNOSIS — R293 Abnormal posture: Secondary | ICD-10-CM

## 2023-06-06 NOTE — Therapy (Signed)
OUTPATIENT PHYSICAL THERAPY TREATMENT   Patient Name: Cassidy Garcia MRN: [id number] DOB:06-10-1946, 77 y.o., female Today's Date: 06/06/2023  END OF SESSION:  PT End of Session - 06/06/23 1526     Visit Number 7    Number of Visits 16    Date for PT Re-Evaluation 07/06/23    Authorization Type Medicare    Progress Note Due on Visit 10    PT Start Time 1524   pt arrived late   PT Stop Time 1554    PT Time Calculation (min) 30 min    Activity Tolerance Patient limited by pain    Behavior During Therapy [facility] for tasks assessed/performed                   Past Medical History:  Diagnosis Date   Arthritis    Asthma    Bronchitis 03/2016   Chronic kidney disease    stage 3   Colon polyps    tubular adenoma   Constipation    Depression    pt denies   Diverticulosis    GERD (gastroesophageal reflux disease)    Hemorrhoids    History of hiatal hernia    Hyperlipidemia    Hypertension    Hypothyroidism    Shortness of breath    with exertion on occasion   Sinusitis    Skin cancer    Past Surgical History:  Procedure Laterality Date   ABDOMINAL HYSTERECTOMY     BICEPT TENODESIS Right 04/26/2023   Procedure: RIGHT BICEPS TENODESIS;  Surgeon: Cammy Copa, MD;  Location: [facility] OR;  Service: Orthopedics;  Laterality: Right;   CARPOMETACARPAL (CMC) FUSION OF THUMB Bilateral    CATARACT EXTRACTION, BILATERAL     CHOLECYSTECTOMY     COLONOSCOPY  02/12/2011   HAMMER TOE SURGERY Right    LAPAROSCOPIC APPENDECTOMY N/A 08/26/2021   Procedure: APPENDECTOMY LAPAROSCOPIC WITH LYSIS OF ADHESIONS;  Surgeon: Karie Soda, MD;  Location: [facility] OR[facility];  Service: General;  Laterality: N/A;   NASAL SINUS SURGERY     POLYPECTOMY     REVERSE SHOULDER ARTHROPLASTY Right 04/26/2023   Procedure: RIGHT REVERSE SHOULDER ARTHROPLASTY;  Surgeon: Cammy Copa, MD;  Location: [facility] OR;  Service: Orthopedics;  Laterality: Right;   TOTAL HIP ARTHROPLASTY  12/10/2011   Procedure: TOTAL HIP  ARTHROPLASTY ANTERIOR APPROACH;  Surgeon: Kathryne Hitch, MD;  Location: [facility];  Service: Orthopedics;  Laterality: Right;  Right Total Hip Arthroplasty, Anterior Approach   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   Rotator cuff tendonitis, right 07/02/2022   Nausea 12/02/2021   Bright red rectal bleeding 09/15/2021   History of colonic polyps 07/13/2021   Gastroenteritis 09/09/2020   Anemia 03/06/2020   Hypothyroidism 02/04/2020   Depression 01/24/2020   Asthma 01/24/2020   CKD (chronic kidney disease), stage III (HCC) 01/24/2020   Acute on chronic renal failure (HCC) 01/24/2020   Constipation 12/20/2018   Pain in right hip 06/29/2017   Trochanteric bursitis, right hip 06/29/2017   History of total replacement of right hip 06/29/2017   Hemorrhoids 03/01/2017   Great toe pain, left 12/04/2016   Chronic right shoulder pain 12/04/2016   Non-toxic multinodular goiter 05/14/2013   Degenerative arthritis of hip 12/10/2011   HTN (hypertension) 07/06/2011   Hyperlipidemia 07/06/2011   Gastro-esophageal reflux disease without esophagitis 09/23/2009  PCP: Geoffry Paradise, MD  REFERRING PROVIDER: Cammy Copa, MD  REFERRING DIAG: [phone] (ICD-10-CM) - Arthritis of right shoulder region Z96.611 (ICD-10-CM) - S/P reverse total shoulder arthroplasty, right  Rationale for Evaluation and Treatment: Rehabilitation  THERAPY DIAG:  Acute pain of right shoulder  Stiffness of right shoulder, not elsewhere classified  Muscle weakness (generalized)  Abnormal posture  Chronic right shoulder pain  ONSET DATE: DOS: 04/26/23   SUBJECTIVE:                                                                                                                                                                                            SUBJECTIVE STATEMENT: Shoulder is doing really well; no pain.  Back has been more painful  PERTINENT HISTORY:  OA, asthma, depression, HTN  PAIN:   NPRS scale: 0/10 today Pain location: Rt shoulder Pain description: ache, dull Aggravating factors: jarring from quick movement to avoid cait.   Relieving factors: nothing  PRECAUTIONS:  Shoulder: P/AROM, no ER > 30 deg, okay for deltoid isometrics, no lifting anything heavier than a cup of coffee or cell phone  RED FLAGS: None   WEIGHT BEARING RESTRICTIONS:  No  FALLS:  Has patient fallen in last 6 months? No  LIVING ENVIRONMENT: Lives with: lives alone  OCCUPATION:  Retired  PLOF:  Independent and Leisure: watch TV, puzzles  PATIENT GOALS:  Improved ROM and strength; be able to put dishes and clothes away; reach behind back   OBJECTIVE:   PATIENT SURVEYS:  05/11/23 FOTO 44 (predicted 59)  COGNITIVE STATUS: 05/11/23 Within functional limits for tasks assessed   SENSATION: 05/11/23 WFL  POSTURE:  05/11/23 rounded shoulders and forward head  HAND DOMINANCE:  05/11/23 Right  GAIT: 05/11/23 Comments: independent  UPPER EXTREMITY ROM:  A/P ROM Right 05/11/23 Right 05/16/2023 Right 05/24/2023 Right 05/30/23 Right 06/06/23  Shoulder flexion A: 112 P: 122 (Supine) AROM 140 deg 146 AROM in supine  A: 130 in standing A: 133 (sitting)  Shoulder extension       Shoulder abduction A: 97 P: 102 (Supine)  110 AROM in supine 115 PROM in supine  A: 130 (sitting)  Shoulder adduction       Shoulder extension       Shoulder internal rotation       Shoulder external rotation P: 25 (Supine)       (Blank rows = not tested)   UPPER EXTREMITY MMT:    05/11/23: deferred due to post-op status  MMT Right eval Left eval  Shoulder flexion    Shoulder extension    Shoulder abduction    Shoulder adduction  Shoulder extension    Shoulder internal rotation    Shoulder external rotation    Middle  trapezius    Lower trapezius    Elbow flexion    Elbow extension    Wrist flexion    Wrist extension    Wrist ulnar deviation    Wrist radial deviation    Wrist pronation    Wrist supination    Grip strength     (Blank rows = not tested)                                                                                                                                              TODAYS TREATMENT:                                                              DATE: 06/06/2023 Therex: AROM measurements - see above for details Rows 15# 2x10 UBE L4 x 6 min (3 min each direction) Rt Seated shoulder flexion 1/4# 3x10 Rt Seated shoulder abduction 1/4# 3x10 Bicep curl on Rt 1/4# 3x10     TODAYS TREATMENT:                                                              DATE: 06/01/2023 Therex: UBE fwd/back 3 mins with 1 min rest between directions.  Lvl 2.0 Supine Rt shoulder AROM 2 x 15  - no weight due to increased arrival symptoms Supine Rt shoulder 90 deg flexion circles small x 30 clockwise, x 30 counterclockwise Lt sidelying Rt shoulder AROM abduction 2 x 15  Manual Rt shoulder PROM, g2 inferior/superior jt mobs in flexion, scaption, abduction for range improvement/pain relief.   TODAYS TREATMENT:                                                              DATE: 05/30/23 TherEx UBE L2 x 6 min (3 min each direction) Supine active flexion 2x15; Rt; 1/4# Supine Rt shoulder 90 deg flexion circles small x 30 clockwise, x 30 counterclockwise with 1/4# weight Lt sidelying Rt shoulder AROM abduction 2 x 15; 1/4# with second set Rt shoulder L2 band IR reactive isometrics 2x10 Rows L2 band 2x10 Standing active shoulder flexion 2x10 on Rt Standing Rt shoulder active  abduction 2x10 on Rt Standing Rt shoulder IR isometrics 5 sec 2x10, moved to sitting due to back pain  TODAYS TREATMENT:                                                              DATE: 05/24/2023 Therex: UBE fwd/back 3 mins  with 1 min rest between directions.  Lvl 2.0 Supine Rt shoulder AROM 2 x 15  - no weight due to increased arrival symptoms Supine Rt shoulder 90 deg flexion circles small x 30 clockwise, x 30 counterclockwise Lt sidelying Rt shoulder AROM abduction 2 x 15  Manual Rt shoulder PROM, g2 inferior/superior jt mobs in flexion, scaption, abduction for range improvement/pain relief.    PATIENT EDUCATION:  05/16/2023 Education details: HEP update Person educated: Patient Education method: Explanation, Demonstration, and Handouts Education comprehension: verbalized understanding, returned demonstration, and needs further education  HOME EXERCISE PROGRAM: Access Code: [email or web address] URL: [email or web address] Date: 05/30/2023 Prepared by: Moshe Cipro  Exercises - Supine Shoulder Flexion with Dowel  - 3-5 x daily - 7 x weekly - 1 sets - 5-10 reps - 3-5 sec hold - Standing Shoulder Abduction ROM with Dowel  - 3-5 x daily - 7 x weekly - 1 sets - 5-10 reps - 3-5 sec hold - Standing Scapular Retraction  - 3-5 x daily - 7 x weekly - 1 sets - 5-10 reps - 5 sec hold - Standing Backward Shoulder Rolls  - 3-5 x daily - 7 x weekly - 1 sets - 5-10 reps - Isometric Shoulder Flexion at Wall (Mirrored)  - 2 x daily - 7 x weekly - 1 sets - 10 reps - 5 hold - Isometric Shoulder External Rotation at Wall (Mirrored)  - 2 x daily - 7 x weekly - 1 sets - 10 reps - 5 hold - Isometric Shoulder Abduction at Wall (Mirrored)  - 2 x daily - 7 x weekly - 1 sets - 10 reps - 5 hold - Standing Isometric Shoulder Internal Rotation at Doorway  - 2 x daily - 7 x weekly - 1 sets - 10 reps - 5 sec hold   ASSESSMENT:  CLINICAL IMPRESSION: Pt with excellent ROM and doing very well with PT.  Will follow up with MD this week and plan to progress strengthening.  Will continue to benefit from PT to maximize function.  OBJECTIVE IMPAIRMENTS: decreased ROM, decreased strength, increased fascial restrictions, increased  muscle spasms, impaired UE functional use, postural dysfunction, and pain.   ACTIVITY LIMITATIONS: carrying, lifting, bathing, toileting, dressing, reach over head, and hygiene/grooming  PARTICIPATION LIMITATIONS: meal prep, cleaning, laundry, driving, shopping, and community activity  PERSONAL FACTORS: 3+ comorbidities: OA, asthma, depression, HTN  are also affecting patient's functional outcome.   REHAB POTENTIAL: Excellent  CLINICAL DECISION MAKING: Evolving/moderate complexity  EVALUATION COMPLEXITY: Moderate   GOALS: Goals reviewed with patient? Yes  SHORT TERM GOALS: Target date: 06/08/2023  Independent with initial HEP Goal status: MET 06/06/23  2.  Rt shoulder flexion and abduction AROM improved 10 deg from improved function and mobility Goal status: MET 06/06/23   LONG TERM GOALS: Target date: 07/06/2023  Independent with final HEP Goal status: INITIAL  2.  FOTO score improved to 59 Goal status: INITIAL  3.  Rt shoulder AROM improved to [facility] in  standing for improved function and ADLs Goal status: INIITAL  4.  Report pain < 2/10 with reaching overhead for improved function Goal status: INITIAL  5.  Report ability to sleep on side with pain </= 2/10 Goal status: INITIAL   PLAN:  PT FREQUENCY: 1-2x/week  PT DURATION: 8 weeks  PLANNED INTERVENTIONS: Therapeutic exercises, Therapeutic activity, Neuromuscular re-education, Patient/Family education, Self Care, Joint mobilization, Aquatic Therapy, Dry Needling, Electrical stimulation, Cryotherapy, Moist heat, Taping, Vasopneumatic device, Ultrasound, Manual therapy, and Re-evaluation.  PLAN FOR NEXT SESSION :   what did MD say,  continue AROM, progressive strengthening as able.   No IR loaded strengthening, no ER past 30 deg   NEXT MD VISIT: 06/08/23   Clarita Crane, PT, DPT 06/06/23 3:55 PM

## 2023-06-08 ENCOUNTER — Ambulatory Visit (INDEPENDENT_AMBULATORY_CARE_PROVIDER_SITE_OTHER): Payer: Medicare Other | Admitting: Orthopedic Surgery

## 2023-06-08 ENCOUNTER — Encounter: Payer: Self-pay | Admitting: Orthopedic Surgery

## 2023-06-08 DIAGNOSIS — N189 Chronic kidney disease, unspecified: Secondary | ICD-10-CM | POA: Diagnosis not present

## 2023-06-08 DIAGNOSIS — Z96611 Presence of right artificial shoulder joint: Secondary | ICD-10-CM

## 2023-06-08 DIAGNOSIS — N2581 Secondary hyperparathyroidism of renal origin: Secondary | ICD-10-CM | POA: Diagnosis not present

## 2023-06-08 DIAGNOSIS — D631 Anemia in chronic kidney disease: Secondary | ICD-10-CM | POA: Diagnosis not present

## 2023-06-08 DIAGNOSIS — N1832 Chronic kidney disease, stage 3b: Secondary | ICD-10-CM | POA: Diagnosis not present

## 2023-06-08 DIAGNOSIS — I129 Hypertensive chronic kidney disease with stage 1 through stage 4 chronic kidney disease, or unspecified chronic kidney disease: Secondary | ICD-10-CM | POA: Diagnosis not present

## 2023-06-08 NOTE — Progress Notes (Signed)
Post-Op Visit Note   Patient: Cassidy Garcia           Date of Birth: 1945-12-28           MRN: [id number] Visit Date: 06/08/2023 PCP: Cassidy Paradise, MD   Assessment & Plan:  Chief Complaint:  Chief Complaint  Patient presents with   Right Shoulder - Routine Post Op    RIGHT RSA (surgery date 04-26-23)   Visit Diagnoses:  1. History of arthroplasty of right shoulder     Plan: Cassidy Garcia underwent right reverse shoulder replacement 04/26/2023.  Doing well.  Has occasional discomfort.  She is 6 weeks out.  On examination does have diminished subscap strength but passive range of motion is 45/95/140.  Plan at this time is she is doing well.  Taking Tylenol for pain.  Follow-up in 6 weeks with Cassidy Garcia for clinical recheck and release  Follow-Up Instructions: Return in about 6 weeks (around 07/20/2023).   Orders:  No orders of the defined types were placed in this encounter.  No orders of the defined types were placed in this encounter.   Imaging: No results found.  PMFS History: 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   Rotator cuff tendonitis, right 07/02/2022   Nausea 12/02/2021   Bright red rectal bleeding 09/15/2021   History of colonic polyps 07/13/2021   Gastroenteritis 09/09/2020   Anemia 03/06/2020   Hypothyroidism 02/04/2020   Depression 01/24/2020   Asthma 01/24/2020   CKD (chronic kidney disease), stage III (HCC) 01/24/2020   Acute on chronic renal failure (HCC) 01/24/2020   Constipation 12/20/2018   Pain in right hip 06/29/2017   Trochanteric bursitis, right hip 06/29/2017   History of total replacement of right hip 06/29/2017   Hemorrhoids 03/01/2017   Great toe pain, left 12/04/2016   Chronic right shoulder pain 12/04/2016   Non-toxic multinodular goiter 05/14/2013   Degenerative arthritis of hip  12/10/2011   HTN (hypertension) 07/06/2011   Hyperlipidemia 07/06/2011   Gastro-esophageal reflux disease without esophagitis 09/23/2009   Past Medical History:  Diagnosis Date   Arthritis    Asthma    Bronchitis 03/2016   Chronic kidney disease    stage 3   Colon polyps    tubular adenoma   Constipation    Depression    pt denies   Diverticulosis    GERD (gastroesophageal reflux disease)    Hemorrhoids    History of hiatal hernia    Hyperlipidemia    Hypertension    Hypothyroidism    Shortness of breath    with exertion on occasion   Sinusitis    Skin cancer     Family History  Problem Relation Age of Onset   Ovarian cancer Mother    Anxiety disorder Mother    Heart disease Paternal Grandmother    Other Paternal Aunt        brain tumor   Birth defects Sister        no iris   Depression Sister    Anxiety disorder Sister    Colon cancer Neg Hx     Past Surgical History:  Procedure Laterality Date   ABDOMINAL HYSTERECTOMY     BICEPT TENODESIS Right 04/26/2023   Procedure: RIGHT BICEPS TENODESIS;  Surgeon: Cammy Copa, MD;  Location: [facility] OR;  Service: Orthopedics;  Laterality: Right;  CARPOMETACARPAL (CMC) FUSION OF THUMB Bilateral    CATARACT EXTRACTION, BILATERAL     CHOLECYSTECTOMY     COLONOSCOPY  02/12/2011   HAMMER TOE SURGERY Right    LAPAROSCOPIC APPENDECTOMY N/A 08/26/2021   Procedure: APPENDECTOMY LAPAROSCOPIC WITH LYSIS OF ADHESIONS;  Surgeon: Karie Soda, MD;  Location: [facility] ORS;  Service: General;  Laterality: N/A;   NASAL SINUS SURGERY     POLYPECTOMY     REVERSE SHOULDER ARTHROPLASTY Right 04/26/2023   Procedure: RIGHT REVERSE SHOULDER ARTHROPLASTY;  Surgeon: Cammy Copa, MD;  Location: [facility] OR;  Service: Orthopedics;  Laterality: Right;   TOTAL HIP ARTHROPLASTY  12/10/2011   Procedure: TOTAL HIP ARTHROPLASTY ANTERIOR APPROACH;  Surgeon: Kathryne Hitch, MD;  Location: [facility] ORS;  Service: Orthopedics;  Laterality: Right;  Right  Total Hip Arthroplasty, Anterior Approach   Social History   Occupational History   Occupation: retired    Associate Professor: RETIRED  Tobacco Use   Smoking status: Former    Current packs/day: 0.00    Types: Cigarettes    Quit date: 08/16/2001    Years since quitting: 21.8   Smokeless tobacco: Never  Vaping Use   Vaping status: Never Used  Substance and Sexual Activity   Alcohol use: Not Currently   Drug use: No   Sexual activity: Not on file

## 2023-06-09 ENCOUNTER — Encounter: Payer: Self-pay | Admitting: Physical Therapy

## 2023-06-09 ENCOUNTER — Ambulatory Visit: Payer: Medicare Other | Admitting: Physical Therapy

## 2023-06-09 DIAGNOSIS — G8929 Other chronic pain: Secondary | ICD-10-CM | POA: Diagnosis not present

## 2023-06-09 DIAGNOSIS — M25611 Stiffness of right shoulder, not elsewhere classified: Secondary | ICD-10-CM | POA: Diagnosis not present

## 2023-06-09 DIAGNOSIS — M25511 Pain in right shoulder: Secondary | ICD-10-CM

## 2023-06-09 DIAGNOSIS — R293 Abnormal posture: Secondary | ICD-10-CM | POA: Diagnosis not present

## 2023-06-09 DIAGNOSIS — M6281 Muscle weakness (generalized): Secondary | ICD-10-CM

## 2023-06-09 NOTE — Therapy (Signed)
OUTPATIENT PHYSICAL THERAPY TREATMENT   Patient Name: Cassidy Garcia MRN: [id number] DOB:Apr 24, 1946, 77 y.o., female Today's Date: 06/09/2023  END OF SESSION:  PT End of Session - 06/09/23 1518     Visit Number 8    Number of Visits 16    Date for PT Re-Evaluation 07/06/23    Authorization Type Medicare    Progress Note Due on Visit 10    PT Start Time 1518    PT Stop Time 1550    PT Time Calculation (min) 32 min    Activity Tolerance Patient limited by pain    Behavior During Therapy [facility] for tasks assessed/performed                    Past Medical History:  Diagnosis Date   Arthritis    Asthma    Bronchitis 03/2016   Chronic kidney disease    stage 3   Colon polyps    tubular adenoma   Constipation    Depression    pt denies   Diverticulosis    GERD (gastroesophageal reflux disease)    Hemorrhoids    History of hiatal hernia    Hyperlipidemia    Hypertension    Hypothyroidism    Shortness of breath    with exertion on occasion   Sinusitis    Skin cancer    Past Surgical History:  Procedure Laterality Date   ABDOMINAL HYSTERECTOMY     BICEPT TENODESIS Right 04/26/2023   Procedure: RIGHT BICEPS TENODESIS;  Surgeon: Cammy Copa, MD;  Location: [facility] OR;  Service: Orthopedics;  Laterality: Right;   CARPOMETACARPAL (CMC) FUSION OF THUMB Bilateral    CATARACT EXTRACTION, BILATERAL     CHOLECYSTECTOMY     COLONOSCOPY  02/12/2011   HAMMER TOE SURGERY Right    LAPAROSCOPIC APPENDECTOMY N/A 08/26/2021   Procedure: APPENDECTOMY LAPAROSCOPIC WITH LYSIS OF ADHESIONS;  Surgeon: Karie Soda, MD;  Location: [facility] ORS;  Service: General;  Laterality: N/A;   NASAL SINUS SURGERY     POLYPECTOMY     REVERSE SHOULDER ARTHROPLASTY Right 04/26/2023   Procedure: RIGHT REVERSE SHOULDER ARTHROPLASTY;  Surgeon: Cammy Copa, MD;  Location: [facility] OR;  Service: Orthopedics;  Laterality: Right;   TOTAL HIP ARTHROPLASTY  12/10/2011   Procedure: TOTAL HIP ARTHROPLASTY  ANTERIOR APPROACH;  Surgeon: Kathryne Hitch, MD;  Location: [facility] OR[facility];  Service: Orthopedics;  Laterality: Right;  Right Total Hip Arthroplasty, Anterior Approach   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   Rotator cuff tendonitis, right 07/02/2022   Nausea 12/02/2021   Bright red rectal bleeding 09/15/2021   History of colonic polyps 07/13/2021   Gastroenteritis 09/09/2020   Anemia 03/06/2020   Hypothyroidism 02/04/2020   Depression 01/24/2020   Asthma 01/24/2020   CKD (chronic kidney disease), stage III (HCC) 01/24/2020   Acute on chronic renal failure (HCC) 01/24/2020   Constipation 12/20/2018   Pain in right hip 06/29/2017   Trochanteric bursitis, right hip 06/29/2017   History of total replacement of right hip 06/29/2017   Hemorrhoids 03/01/2017   Great toe pain, left 12/04/2016   Chronic right shoulder pain 12/04/2016   Non-toxic multinodular goiter 05/14/2013   Degenerative arthritis of hip 12/10/2011   HTN (hypertension) 07/06/2011   Hyperlipidemia 07/06/2011   Gastro-esophageal reflux disease without esophagitis 09/23/2009    PCP:  Geoffry Paradise, MD  REFERRING PROVIDER: Cammy Copa, MD  REFERRING DIAG: [phone] (ICD-10-CM) - Arthritis of right shoulder region Z96.611 (ICD-10-CM) - S/P reverse total shoulder arthroplasty, right  Rationale for Evaluation and Treatment: Rehabilitation  THERAPY DIAG:  Acute pain of right shoulder  Stiffness of right shoulder, not elsewhere classified  Muscle weakness (generalized)  Abnormal posture  Chronic right shoulder pain  ONSET DATE: DOS: 04/26/23   SUBJECTIVE:                                                                                                                                                                                            SUBJECTIVE STATEMENT: MD pleased with progress  PERTINENT HISTORY:  OA, asthma, depression, HTN  PAIN:   NPRS scale: 0/10 today Pain location: Rt shoulder Pain description: ache, dull Aggravating factors: jarring from quick movement to avoid cait.   Relieving factors: nothing  PRECAUTIONS:  Shoulder: P/AROM, no ER > 30 deg, okay for deltoid isometrics, no lifting anything heavier than a cup of coffee or cell phone  RED FLAGS: None   WEIGHT BEARING RESTRICTIONS:  No  FALLS:  Has patient fallen in last 6 months? No  LIVING ENVIRONMENT: Lives with: lives alone  OCCUPATION:  Retired  PLOF:  Independent and Leisure: watch TV, puzzles  PATIENT GOALS:  Improved ROM and strength; be able to put dishes and clothes away; reach behind back   OBJECTIVE:   PATIENT SURVEYS:  05/11/23 FOTO 44 (predicted 59)  COGNITIVE STATUS: 05/11/23 Within functional limits for tasks assessed   SENSATION: 05/11/23 WFL  POSTURE:  05/11/23 rounded shoulders and forward head  HAND DOMINANCE:  05/11/23 Right  GAIT: 05/11/23 Comments: independent  UPPER EXTREMITY ROM:  A/P ROM Right 05/11/23 Right 05/16/2023 Right 05/24/2023 Right 05/30/23 Right 06/06/23  Shoulder flexion A: 112 P: 122 (Supine) AROM 140 deg 146 AROM in supine  A: 130 in standing A: 133 (sitting)  Shoulder extension       Shoulder abduction A: 97 P: 102 (Supine)  110 AROM in supine 115 PROM in supine  A: 130 (sitting)  Shoulder adduction       Shoulder extension       Shoulder internal rotation       Shoulder external rotation P: 25 (Supine)       (Blank rows = not tested)   UPPER EXTREMITY MMT:    05/11/23: deferred due to post-op status  MMT Right eval Left eval  Shoulder flexion    Shoulder extension    Shoulder abduction    Shoulder adduction    Shoulder extension    Shoulder internal  rotation    Shoulder external rotation    Middle trapezius    Lower trapezius    Elbow flexion     Elbow extension    Wrist flexion    Wrist extension    Wrist ulnar deviation    Wrist radial deviation    Wrist pronation    Wrist supination    Grip strength     (Blank rows = not tested)                                                                                                                                               TODAYS TREATMENT:                                                              DATE: 06/09/2023 Therex: Seated Rt flexion 1# 2x10 Seated Rt abduction 1# 3x10 Seated Rt bicep curls 1# 3x10 Seated overhead press 1# 2x10 UBE L4 x 6 min (3 min each direction)    TODAYS TREATMENT:                                                              DATE: 06/06/2023 Therex: AROM measurements - see above for details Rows 15# 2x10 UBE L4 x 6 min (3 min each direction) Rt Seated shoulder flexion 1/4# 3x10 Rt Seated shoulder abduction 1/4# 3x10 Bicep curl on Rt 1/4# 3x10     TODAYS TREATMENT:                                                              DATE: 06/01/2023 Therex: UBE fwd/back 3 mins with 1 min rest between directions.  Lvl 2.0 Supine Rt shoulder AROM 2 x 15  - no weight due to increased arrival symptoms Supine Rt shoulder 90 deg flexion circles small x 30 clockwise, x 30 counterclockwise Lt sidelying Rt shoulder AROM abduction 2 x 15  Manual Rt shoulder PROM, g2 inferior/superior jt mobs in flexion, scaption, abduction for range improvement/pain relief.   TODAYS TREATMENT:  DATE: 05/30/23 TherEx UBE L2 x 6 min (3 min each direction) Supine active flexion 2x15; Rt; 1/4# Supine Rt shoulder 90 deg flexion circles small x 30 clockwise, x 30 counterclockwise with 1/4# weight Lt sidelying Rt shoulder AROM abduction 2 x 15; 1/4# with second set Rt shoulder L2 band IR reactive isometrics 2x10 Rows L2 band 2x10 Standing active shoulder flexion 2x10 on Rt Standing Rt shoulder active abduction  2x10 on Rt Standing Rt shoulder IR isometrics 5 sec 2x10, moved to sitting due to back pain   PATIENT EDUCATION:  05/16/2023 Education details: HEP update Person educated: Patient Education method: Programmer, multimedia, Demonstration, and Handouts Education comprehension: verbalized understanding, returned demonstration, and needs further education  HOME EXERCISE PROGRAM: Access Code: [email or web address] URL: [email or web address] Date: 06/09/2023 Prepared by: Moshe Cipro  Exercises - Isometric Shoulder Flexion at Wall (Mirrored)  - 2 x daily - 7 x weekly - 1 sets - 10 reps - 5 hold - Isometric Shoulder External Rotation at Wall (Mirrored)  - 2 x daily - 7 x weekly - 1 sets - 10 reps - 5 hold - Isometric Shoulder Abduction at Wall (Mirrored)  - 2 x daily - 7 x weekly - 1 sets - 10 reps - 5 hold - Standing Isometric Shoulder Internal Rotation at Doorway  - 2 x daily - 7 x weekly - 1 sets - 10 reps - 5 sec hold - Seated Single Arm Shoulder Flexion with Dumbbells (Mirrored)  - 1 x daily - 7 x weekly - 2-3 sets - 10 reps - Seated Single Arm Shoulder Abduction with Dumbbell - Thumb Up (Mirrored)  - 1 x daily - 7 x weekly - 2-3 sets - 10 reps - Seated Single Arm Bicep Curls Supinated with Dumbbell (Mirrored)  - 1 x daily - 7 x weekly - 3 sets - 10 reps - Seated Overhead Press with Dumbbells  - 1 x daily - 7 x weekly - 3 sets - 10 reps   ASSESSMENT:  CLINICAL IMPRESSION: Updated HEP today to progress strengthening exercises which pt tolerated well.  She did have some expected muscle fatigue but overall good tolerance.  Anticipate we are close to transitioning to HEP only.  OBJECTIVE IMPAIRMENTS: decreased ROM, decreased strength, increased fascial restrictions, increased muscle spasms, impaired UE functional use, postural dysfunction, and pain.   ACTIVITY LIMITATIONS: carrying, lifting, bathing, toileting, dressing, reach over head, and hygiene/grooming  PARTICIPATION LIMITATIONS: meal  prep, cleaning, laundry, driving, shopping, and community activity  PERSONAL FACTORS: 3+ comorbidities: OA, asthma, depression, HTN  are also affecting patient's functional outcome.   REHAB POTENTIAL: Excellent  CLINICAL DECISION MAKING: Evolving/moderate complexity  EVALUATION COMPLEXITY: Moderate   GOALS: Goals reviewed with patient? Yes  SHORT TERM GOALS: Target date: 06/08/2023  Independent with initial HEP Goal status: MET 06/06/23  2.  Rt shoulder flexion and abduction AROM improved 10 deg from improved function and mobility Goal status: MET 06/06/23   LONG TERM GOALS: Target date: 07/06/2023  Independent with final HEP Goal status: INITIAL  2.  FOTO score improved to 59 Goal status: INITIAL  3.  Rt shoulder AROM improved to [facility] in standing for improved function and ADLs Goal status: INIITAL  4.  Report pain < 2/10 with reaching overhead for improved function Goal status: INITIAL  5.  Report ability to sleep on side with pain </= 2/10 Goal status: INITIAL   PLAN:  PT FREQUENCY: 1-2x/week  PT DURATION: 8 weeks  PLANNED INTERVENTIONS: Therapeutic exercises, Therapeutic activity, Neuromuscular re-education, Patient/Family  education, Self Care, Joint mobilization, Aquatic Therapy, Dry Needling, Electrical stimulation, Cryotherapy, Moist heat, Taping, Vasopneumatic device, Ultrasound, Manual therapy, and Re-evaluation.  PLAN FOR NEXT SESSION :   progressive strengthening as tolerated; likely anticipate d/c  planning  NEXT MD VISIT: 06/08/23   Clarita Crane, PT, DPT 06/09/23 3:53 PM

## 2023-06-10 ENCOUNTER — Telehealth: Payer: Self-pay | Admitting: *Deleted

## 2023-06-10 NOTE — Telephone Encounter (Signed)
Ortho bundle in office visit completed. 

## 2023-06-13 ENCOUNTER — Encounter: Payer: Medicare Other | Admitting: Physical Therapy

## 2023-06-14 DIAGNOSIS — Z23 Encounter for immunization: Secondary | ICD-10-CM | POA: Diagnosis not present

## 2023-06-15 ENCOUNTER — Encounter: Payer: Self-pay | Admitting: Physical Therapy

## 2023-06-15 ENCOUNTER — Ambulatory Visit (INDEPENDENT_AMBULATORY_CARE_PROVIDER_SITE_OTHER): Payer: Medicare Other | Admitting: Physical Therapy

## 2023-06-15 DIAGNOSIS — R293 Abnormal posture: Secondary | ICD-10-CM | POA: Diagnosis not present

## 2023-06-15 DIAGNOSIS — M6281 Muscle weakness (generalized): Secondary | ICD-10-CM

## 2023-06-15 DIAGNOSIS — M25611 Stiffness of right shoulder, not elsewhere classified: Secondary | ICD-10-CM

## 2023-06-15 DIAGNOSIS — M25511 Pain in right shoulder: Secondary | ICD-10-CM | POA: Diagnosis not present

## 2023-06-15 NOTE — Therapy (Signed)
OUTPATIENT PHYSICAL THERAPY TREATMENT DISCHARGE SUMMARY   Patient Name: Cassidy Garcia MRN: [id number] DOB:10/29/1945, 77 y.o., female Today's Date: 06/15/2023  END OF SESSION:  PT End of Session - 06/15/23 1503     Visit Number 9    Number of Visits 16    Date for PT Re-Evaluation 07/06/23    Authorization Type Medicare    Progress Note Due on Visit 10    PT Start Time 1500    PT Stop Time 1538    PT Time Calculation (min) 38 min    Activity Tolerance Patient limited by pain    Behavior During Therapy [facility] for tasks assessed/performed                     Past Medical History:  Diagnosis Date   Arthritis    Asthma    Bronchitis 03/2016   Chronic kidney disease    stage 3   Colon polyps    tubular adenoma   Constipation    Depression    pt denies   Diverticulosis    GERD (gastroesophageal reflux disease)    Hemorrhoids    History of hiatal hernia    Hyperlipidemia    Hypertension    Hypothyroidism    Shortness of breath    with exertion on occasion   Sinusitis    Skin cancer    Past Surgical History:  Procedure Laterality Date   ABDOMINAL HYSTERECTOMY     BICEPT TENODESIS Right 04/26/2023   Procedure: RIGHT BICEPS TENODESIS;  Surgeon: Cammy Copa, MD;  Location: [facility] OR;  Service: Orthopedics;  Laterality: Right;   CARPOMETACARPAL (CMC) FUSION OF THUMB Bilateral    CATARACT EXTRACTION, BILATERAL     CHOLECYSTECTOMY     COLONOSCOPY  02/12/2011   HAMMER TOE SURGERY Right    LAPAROSCOPIC APPENDECTOMY N/A 08/26/2021   Procedure: APPENDECTOMY LAPAROSCOPIC WITH LYSIS OF ADHESIONS;  Surgeon: Karie Soda, MD;  Location: [facility] ORS;  Service: General;  Laterality: N/A;   NASAL SINUS SURGERY     POLYPECTOMY     REVERSE SHOULDER ARTHROPLASTY Right 04/26/2023   Procedure: RIGHT REVERSE SHOULDER ARTHROPLASTY;  Surgeon: Cammy Copa, MD;  Location: [facility] OR;  Service: Orthopedics;  Laterality: Right;   TOTAL HIP ARTHROPLASTY  12/10/2011   Procedure:  TOTAL HIP ARTHROPLASTY ANTERIOR APPROACH;  Surgeon: Kathryne Hitch, MD;  Location: [facility] ORS;  Service: Orthopedics;  Laterality: Right;  Right Total Hip Arthroplasty, Anterior Approach   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   Rotator cuff tendonitis, right 07/02/2022   Nausea 12/02/2021   Bright red rectal bleeding 09/15/2021   History of colonic polyps 07/13/2021   Gastroenteritis 09/09/2020   Anemia 03/06/2020   Hypothyroidism 02/04/2020   Depression 01/24/2020   Asthma 01/24/2020   CKD (chronic kidney disease), stage III (HCC) 01/24/2020   Acute on chronic renal failure (HCC) 01/24/2020   Constipation 12/20/2018   Pain in right hip 06/29/2017   Trochanteric bursitis, right hip 06/29/2017   History of total replacement of right hip 06/29/2017   Hemorrhoids 03/01/2017   Great toe pain, left 12/04/2016   Chronic right shoulder pain 12/04/2016   Non-toxic multinodular goiter 05/14/2013   Degenerative arthritis of hip 12/10/2011   HTN (hypertension) 07/06/2011   Hyperlipidemia 07/06/2011   Gastro-esophageal reflux disease without esophagitis 09/23/2009  PCP: Geoffry Paradise, MD  REFERRING PROVIDER: Cammy Copa, MD  REFERRING DIAG: [phone] (ICD-10-CM) - Arthritis of right shoulder region Z96.611 (ICD-10-CM) - S/P reverse total shoulder arthroplasty, right  Rationale for Evaluation and Treatment: Rehabilitation  THERAPY DIAG:  Acute pain of right shoulder  Stiffness of right shoulder, not elsewhere classified  Muscle weakness (generalized)  Abnormal posture  ONSET DATE: DOS: 04/26/23   SUBJECTIVE:                                                                                                                                                                                           SUBJECTIVE  STATEMENT: Shoulder is doing well; back is more painful; pain up to 2/10  PERTINENT HISTORY:  OA, asthma, depression, HTN  PAIN:   NPRS scale: 0/10 today Pain location: Rt shoulder Pain description: ache, dull Aggravating factors: jarring from quick movement to avoid cait.   Relieving factors: nothing  PRECAUTIONS:  Shoulder: P/AROM, no ER > 30 deg, okay for deltoid isometrics, no lifting anything heavier than a cup of coffee or cell phone  RED FLAGS: None   WEIGHT BEARING RESTRICTIONS:  No  FALLS:  Has patient fallen in last 6 months? No  LIVING ENVIRONMENT: Lives with: lives alone  OCCUPATION:  Retired  PLOF:  Independent and Leisure: watch TV, puzzles  PATIENT GOALS:  Improved ROM and strength; be able to put dishes and clothes away; reach behind back   OBJECTIVE:   PATIENT SURVEYS:  05/11/23 FOTO 44 (predicted 59) 06/15/23: FOTO 61  COGNITIVE STATUS: 05/11/23 Within functional limits for tasks assessed   SENSATION: 05/11/23 WFL  POSTURE:  05/11/23 rounded shoulders and forward head  HAND DOMINANCE:  05/11/23 Right  GAIT: 05/11/23 Comments: independent  UPPER EXTREMITY ROM:  A/P ROM Right 05/11/23 Right 05/16/2023 Right 05/24/2023 Right 05/30/23 Right 06/06/23  Shoulder flexion A: 112 P: 122 (Supine) AROM 140 deg 146 AROM in supine  A: 130 in standing A: 133 (sitting)  Shoulder extension       Shoulder abduction A: 97 P: 102 (Supine)  110 AROM in supine 115 PROM in supine  A: 130 (sitting)  Shoulder adduction       Shoulder extension       Shoulder internal rotation       Shoulder external rotation P: 25 (Supine)       (Blank rows = not tested)   UPPER EXTREMITY MMT:    05/11/23: deferred due to post-op status  MMT Right eval Left eval  Shoulder flexion    Shoulder extension    Shoulder abduction    Shoulder adduction  Shoulder extension    Shoulder internal rotation    Shoulder external rotation    Middle  trapezius    Lower trapezius    Elbow flexion    Elbow extension    Wrist flexion    Wrist extension    Wrist ulnar deviation    Wrist radial deviation    Wrist pronation    Wrist supination    Grip strength     (Blank rows = not tested)                                                                                                                                               TODAYS TREATMENT:                                                              DATE: 06/15/2023 Therex: UBE L3 x 7 min (3.5 min each direction) Seated Rt flexion 1# 3x10 Seated Rt abduction 1# 3x10 Seated Rt bicep curls 1# 3x10 Seated overhead press 1# 2x10 Discussed aquatic exercise and continuation of current HEP for shoulder   TODAYS TREATMENT:                                                              DATE: 06/09/2023 Therex: Seated Rt flexion 1# 2x10 Seated Rt abduction 1# 3x10 Seated Rt bicep curls 1# 3x10 Seated overhead press 1# 2x10 UBE L4 x 6 min (3 min each direction)    TODAYS TREATMENT:                                                              DATE: 06/06/2023 Therex: AROM measurements - see above for details Rows 15# 2x10 UBE L4 x 6 min (3 min each direction) Rt Seated shoulder flexion 1/4# 3x10 Rt Seated shoulder abduction 1/4# 3x10 Bicep curl on Rt 1/4# 3x10     TODAYS TREATMENT:  DATE: 06/01/2023 Therex: UBE fwd/back 3 mins with 1 min rest between directions.  Lvl 2.0 Supine Rt shoulder AROM 2 x 15  - no weight due to increased arrival symptoms Supine Rt shoulder 90 deg flexion circles small x 30 clockwise, x 30 counterclockwise Lt sidelying Rt shoulder AROM abduction 2 x 15  Manual Rt shoulder PROM, g2 inferior/superior jt mobs in flexion, scaption, abduction for range improvement/pain relief.   TODAYS TREATMENT:                                                              DATE: 05/30/23 TherEx UBE L2 x  6 min (3 min each direction) Supine active flexion 2x15; Rt; 1/4# Supine Rt shoulder 90 deg flexion circles small x 30 clockwise, x 30 counterclockwise with 1/4# weight Lt sidelying Rt shoulder AROM abduction 2 x 15; 1/4# with second set Rt shoulder L2 band IR reactive isometrics 2x10 Rows L2 band 2x10 Standing active shoulder flexion 2x10 on Rt Standing Rt shoulder active abduction 2x10 on Rt Standing Rt shoulder IR isometrics 5 sec 2x10, moved to sitting due to back pain   PATIENT EDUCATION:  05/16/2023 Education details: HEP update Person educated: Patient Education method: Explanation, Demonstration, and Handouts Education comprehension: verbalized understanding, returned demonstration, and needs further education  HOME EXERCISE PROGRAM: Access Code: [email or web address] URL: [email or web address] Date: 06/09/2023 Prepared by: Moshe Cipro  Exercises - Isometric Shoulder Flexion at Wall (Mirrored)  - 2 x daily - 7 x weekly - 1 sets - 10 reps - 5 hold - Isometric Shoulder External Rotation at Wall (Mirrored)  - 2 x daily - 7 x weekly - 1 sets - 10 reps - 5 hold - Isometric Shoulder Abduction at Wall (Mirrored)  - 2 x daily - 7 x weekly - 1 sets - 10 reps - 5 hold - Standing Isometric Shoulder Internal Rotation at Doorway  - 2 x daily - 7 x weekly - 1 sets - 10 reps - 5 sec hold - Seated Single Arm Shoulder Flexion with Dumbbells (Mirrored)  - 1 x daily - 7 x weekly - 2-3 sets - 10 reps - Seated Single Arm Shoulder Abduction with Dumbbell - Thumb Up (Mirrored)  - 1 x daily - 7 x weekly - 2-3 sets - 10 reps - Seated Single Arm Bicep Curls Supinated with Dumbbell (Mirrored)  - 1 x daily - 7 x weekly - 3 sets - 10 reps - Seated Overhead Press with Dumbbells  - 1 x daily - 7 x weekly - 3 sets - 10 reps   ASSESSMENT:  CLINICAL IMPRESSION: Pt has met all goals except sleeping goal at this time.  Overall she is pleased with her progress and will d/c PT today.  OBJECTIVE  IMPAIRMENTS: decreased ROM, decreased strength, increased fascial restrictions, increased muscle spasms, impaired UE functional use, postural dysfunction, and pain.   ACTIVITY LIMITATIONS: carrying, lifting, bathing, toileting, dressing, reach over head, and hygiene/grooming  PARTICIPATION LIMITATIONS: meal prep, cleaning, laundry, driving, shopping, and community activity  PERSONAL FACTORS: 3+ comorbidities: OA, asthma, depression, HTN  are also affecting patient's functional outcome.   REHAB POTENTIAL: Excellent  CLINICAL DECISION MAKING: Evolving/moderate complexity  EVALUATION COMPLEXITY: Moderate   GOALS: Goals reviewed with patient? Yes  SHORT TERM GOALS: Target date: 06/08/2023  Independent  with initial HEP Goal status: MET 06/06/23  2.  Rt shoulder flexion and abduction AROM improved 10 deg from improved function and mobility Goal status: MET 06/06/23   LONG TERM GOALS: Target date: 07/06/2023  Independent with final HEP Goal status: met 06/15/23  2.  FOTO score improved to 59 Goal status: met 06/15/23  3.  Rt shoulder AROM improved to [facility] in standing for improved function and ADLs Goal status: met 06/15/23  4.  Report pain < 2/10 with reaching overhead for improved function Goal status: met 06/15/23  5.  Report ability to sleep on side with pain </= 2/10 Goal status: not met 06/15/23   PLAN:  PT FREQUENCY: 1-2x/week  PT DURATION: 8 weeks  PLANNED INTERVENTIONS: Therapeutic exercises, Therapeutic activity, Neuromuscular re-education, Patient/Family education, Self Care, Joint mobilization, Aquatic Therapy, Dry Needling, Electrical stimulation, Cryotherapy, Moist heat, Taping, Vasopneumatic device, Ultrasound, Manual therapy, and Re-evaluation.  PLAN FOR NEXT SESSION :   d/c PT today  NEXT MD VISIT: 07/20/23   Clarita Crane, PT, DPT 06/15/23 3:38 PM     PHYSICAL THERAPY DISCHARGE SUMMARY  Visits from Start of Care: 9  Current functional  level related to goals / functional outcomes: See above   Remaining deficits: See above   Education / Equipment: HEP   Patient agrees to discharge. Patient goals were met. Patient is being discharged due to being pleased with the current functional level.  Clarita Crane, PT, DPT 06/15/23 3:40 PM

## 2023-06-17 ENCOUNTER — Encounter: Payer: Self-pay | Admitting: Physical Medicine and Rehabilitation

## 2023-06-21 ENCOUNTER — Encounter: Payer: Self-pay | Admitting: Physical Medicine and Rehabilitation

## 2023-06-21 ENCOUNTER — Other Ambulatory Visit: Payer: Self-pay | Admitting: Physical Medicine and Rehabilitation

## 2023-06-21 DIAGNOSIS — M5416 Radiculopathy, lumbar region: Secondary | ICD-10-CM

## 2023-06-24 DIAGNOSIS — D631 Anemia in chronic kidney disease: Secondary | ICD-10-CM | POA: Diagnosis not present

## 2023-06-24 DIAGNOSIS — N1832 Chronic kidney disease, stage 3b: Secondary | ICD-10-CM | POA: Diagnosis not present

## 2023-06-28 ENCOUNTER — Other Ambulatory Visit: Payer: Self-pay | Admitting: Physical Medicine and Rehabilitation

## 2023-06-28 DIAGNOSIS — M5416 Radiculopathy, lumbar region: Secondary | ICD-10-CM

## 2023-06-28 DIAGNOSIS — M48062 Spinal stenosis, lumbar region with neurogenic claudication: Secondary | ICD-10-CM

## 2023-07-11 ENCOUNTER — Other Ambulatory Visit: Payer: Self-pay

## 2023-07-11 ENCOUNTER — Ambulatory Visit: Payer: Medicare Other | Admitting: Physical Medicine and Rehabilitation

## 2023-07-11 DIAGNOSIS — M5416 Radiculopathy, lumbar region: Secondary | ICD-10-CM

## 2023-07-11 MED ORDER — METHYLPREDNISOLONE ACETATE 40 MG/ML IJ SUSP
40.0000 mg | Freq: Once | INTRAMUSCULAR | Status: AC
Start: 2023-07-11 — End: 2023-07-11
  Administered 2023-07-11: 40 mg

## 2023-07-11 NOTE — Patient Instructions (Signed)

## 2023-07-11 NOTE — Progress Notes (Signed)
Functional Pain Scale - descriptive words and definitions  Mild (2)   Noticeable when not distracted/no impact on ADL's/sleep only slightly affected and able to   use both passive and active distraction for comfort. Mild range order  Average Pain 3  113/69 she is having pain on her R side vs L  +Driver, -BT, -Dye Allergies.

## 2023-07-16 NOTE — Progress Notes (Signed)
Cassidy Garcia - 77 y.o. female MRN [id number]  Date of birth: 1946-01-14  Office Visit Note: Visit Date: 07/11/2023 PCP: Geoffry Paradise, MD Referred by: Geoffry Paradise, MD  Subjective: Chief Complaint  Patient presents with   Lower Back - Pain   HPI:  Cassidy Garcia is a 77 y.o. female who comes in today at the request of Ellin Goodie, FNP for planned Bilateral L5-S1 Lumbar Transforaminal epidural steroid injection with fluoroscopic guidance.  The patient has failed conservative care including home exercise, medications, time and activity modification.  This injection will be diagnostic and hopefully therapeutic.  Please see requesting physician notes for further details and justification.   ROS Otherwise per HPI.  Assessment & Plan: Visit Diagnoses:    ICD-10-CM   1. Lumbar radiculopathy  M54.16 XR C-ARM NO REPORT    Epidural Steroid injection    methylPREDNISolone acetate (DEPO-MEDROL) injection 40 mg      Plan: No additional findings.   Meds & Orders:  Meds ordered this encounter  Medications   methylPREDNISolone acetate (DEPO-MEDROL) injection 40 mg    Orders Placed This Encounter  Procedures   XR C-ARM NO REPORT   Epidural Steroid injection    Follow-up: Return for visit to requesting provider as needed.   Procedures: No procedures performed  Lumbosacral Transforaminal Epidural Steroid Injection - Sub-Pedicular Approach with Fluoroscopic Guidance  Patient: Cassidy Garcia      Date of Birth: Mar 04, 1946 MRN: [id number] PCP: Geoffry Paradise, MD      Visit Date: 07/11/2023   Universal Protocol:    Date/Time: 07/11/2023  Consent Given By: the patient  Position: PRONE  Additional Comments: Vital signs were monitored before and after the procedure. Patient was prepped and draped in the usual sterile fashion. The correct patient, procedure, and site was verified.   Injection Procedure Details:   Procedure diagnoses: Lumbar radiculopathy [M54.16]     Meds Administered:  Meds ordered this encounter  Medications   methylPREDNISolone acetate (DEPO-MEDROL) injection 40 mg    Laterality: Bilateral  Location/Site: L5  Needle:5.0 in., 22 ga.  Short bevel or Quincke spinal needle  Needle Placement: Transforaminal  Findings:    -Comments: Excellent flow of contrast along the nerve, nerve root and into the epidural space.  Procedure Details: After squaring off the end-plates to get a true AP view, the C-arm was positioned so that an oblique view of the foramen as noted above was visualized. The target area is just inferior to the "nose of the scotty dog" or sub pedicular. The soft tissues overlying this structure were infiltrated with 2-3 ml. of 1% Lidocaine without Epinephrine.  The spinal needle was inserted toward the target using a "trajectory" view along the fluoroscope beam.  Under AP and lateral visualization, the needle was advanced so it did not puncture dura and was located close the 6 O'Clock position of the pedical in AP tracterory. Biplanar projections were used to confirm position. Aspiration was confirmed to be negative for CSF and/or blood. A 1-2 ml. volume of Isovue-250 was injected and flow of contrast was noted at each level. Radiographs were obtained for documentation purposes.   After attaining the desired flow of contrast documented above, a 0.5 to 1.0 ml test dose of 0.25% Marcaine was injected into each respective transforaminal space.  The patient was observed for 90 seconds post injection.  After no sensory deficits were reported, and normal lower extremity motor function was noted,   the above injectate was administered so  that equal amounts of the injectate were placed at each foramen (level) into the transforaminal epidural space.   Additional Comments:  No complications occurred Dressing: 2 x 2 sterile gauze and Band-Aid    Post-procedure details: Patient was observed during the procedure. Post-procedure  instructions were reviewed.  Patient left the clinic in stable condition.    Clinical History: MRI LUMBAR SPINE WITHOUT CONTRAST   TECHNIQUE: Multiplanar, multisequence MR imaging of the lumbar spine was performed. No intravenous contrast was administered.   COMPARISON:  Radiographs December 02, 2021.   FINDINGS: Segmentation:  Standard.   Alignment: Dextroconvex scoliosis. Small anterolisthesis at L3-4 and L4-5. Small retrolisthesis at L1-2, L2-3 and L5-S1.   Vertebrae: No fracture, evidence of discitis, or bone lesion. Endplate degenerative changes with loss of disc height and Schmorl nodes throughout the lumbar spine with associated marrow edema at L3-4 and L4-5.   Conus medullaris and cauda equina: Conus extends to the L1 level. Conus and cauda equina appear normal.   Paraspinal and other soft tissues: Negative.   Disc levels:   T12-L1: Disc bulge and moderate facet degenerative changes without significant spinal canal or neural foraminal stenosis.   L1-2: Disc bulge, moderate facet degenerative changes and ligamentum flavum redundancy resulting in mild spinal canal stenosis with narrowing of the bilateral subarticular zones and mild bilateral foraminal.   L2-3: Disc bulge, moderate hypertrophic facet degenerative changes and ligamentum flavum redundancy resulting in mild spinal canal stenosis, mild right and moderate left neural foraminal narrowing.   L3-4: Disc bulge, prominent hypertrophic facet degenerative changes with bilateral joint effusion and ligamentum flavum redundancy resulting in severe spinal canal stenosis, mild right and severe left neural foraminal.   L4-5: Disc bulge, prominent hypertrophic facet degenerative changes with bilateral joint effusion and ligamentum flavum redundancy resulting in severe spinal canal stenosis, mild right and moderate left neural foraminal narrowing.   L5-S1: Disc bulge, prominent hypertrophic facet degenerative  changes with bilateral joint effusion and ligamentum flavum redundancy resulting in severe spinal canal stenosis and mild-to-moderate bilateral foraminal narrowing.   IMPRESSION: 1. 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 2. Multilevel neural foraminal narrowing severe on the left at L3-4 and moderate on the left at L2-3 and L4-5.     Electronically Signed   By: Baldemar Lenis M.D.   On: 01/21/2022 16:03     Objective:  VS:  HT:    WT:   BMI:     BP:   HR: bpm  TEMP: ( )  RESP:  Physical Exam Vitals and nursing note reviewed.  Constitutional:      General: She is not in acute distress.    Appearance: Normal appearance. She is not ill-appearing.  HENT:     Head: Normocephalic and atraumatic.     Right Ear: External ear normal.     Left Ear: External ear normal.  Eyes:     Extraocular Movements: Extraocular movements intact.  Cardiovascular:     Rate and Rhythm: Normal rate.     Pulses: Normal pulses.  Pulmonary:     Effort: Pulmonary effort is normal. No respiratory distress.  Abdominal:     General: There is no distension.     Palpations: Abdomen is soft.  Musculoskeletal:        General: Tenderness present.     Cervical back: Neck supple.     Right lower leg: No edema.     Left lower leg:  No edema.     Comments: Patient has good distal strength with no pain over the greater trochanters.  No clonus or focal weakness.  Skin:    Findings: No erythema, lesion or rash.  Neurological:     General: No focal deficit present.     Mental Status: She is alert and oriented to person, place, and time.     Sensory: No sensory deficit.     Motor: No weakness or abnormal muscle tone.     Coordination: Coordination normal.  Psychiatric:        Mood and Affect: Mood normal.        Behavior: Behavior normal.      Imaging: No results found.

## 2023-07-16 NOTE — Procedures (Signed)
Lumbosacral Transforaminal Epidural Steroid Injection - Sub-Pedicular Approach with Fluoroscopic Guidance  Patient: Cassidy Garcia      Date of Birth: [birth year] MRN: [id number] PCP: Geoffry Paradise, MD      Visit Date: 07/11/2023   Universal Protocol:    Date/Time: 07/11/2023  Consent Given By: the patient  Position: PRONE  Additional Comments: Vital signs were monitored before and after the procedure. Patient was prepped and draped in the usual sterile fashion. The correct patient, procedure, and site was verified.   Injection Procedure Details:   Procedure diagnoses: Lumbar radiculopathy [M54.16]    Meds Administered:  Meds ordered this encounter  Medications   methylPREDNISolone acetate (DEPO-MEDROL) injection 40 mg    Laterality: Bilateral  Location/Site: L5  Needle:5.0 in., 22 ga.  Short bevel or Quincke spinal needle  Needle Placement: Transforaminal  Findings:    -Comments: Excellent flow of contrast along the nerve, nerve root and into the epidural space.  Procedure Details: After squaring off the end-plates to get a true AP view, the C-arm was positioned so that an oblique view of the foramen as noted above was visualized. The target area is just inferior to the "nose of the scotty dog" or sub pedicular. The soft tissues overlying this structure were infiltrated with 2-3 ml. of 1% Lidocaine without Epinephrine.  The spinal needle was inserted toward the target using a "trajectory" view along the fluoroscope beam.  Under AP and lateral visualization, the needle was advanced so it did not puncture dura and was located close the 6 O'Clock position of the pedical in AP tracterory. Biplanar projections were used to confirm position. Aspiration was confirmed to be negative for CSF and/or blood. A 1-2 ml. volume of Isovue-250 was injected and flow of contrast was noted at each level. Radiographs were obtained for documentation purposes.   After attaining the desired  flow of contrast documented above, a 0.5 to 1.0 ml test dose of 0.25% Marcaine was injected into each respective transforaminal space.  The patient was observed for 90 seconds post injection.  After no sensory deficits were reported, and normal lower extremity motor function was noted,   the above injectate was administered so that equal amounts of the injectate were placed at each foramen (level) into the transforaminal epidural space.   Additional Comments:  No complications occurred Dressing: 2 x 2 sterile gauze and Band-Aid    Post-procedure details: Patient was observed during the procedure. Post-procedure instructions were reviewed.  Patient left the clinic in stable condition.

## 2023-07-20 ENCOUNTER — Ambulatory Visit (INDEPENDENT_AMBULATORY_CARE_PROVIDER_SITE_OTHER): Payer: Medicare Other | Admitting: Orthopedic Surgery

## 2023-07-20 DIAGNOSIS — Z96611 Presence of right artificial shoulder joint: Secondary | ICD-10-CM

## 2023-07-22 ENCOUNTER — Encounter: Payer: Self-pay | Admitting: Orthopedic Surgery

## 2023-07-22 NOTE — Progress Notes (Signed)
Post-Op Visit Note   Patient: Cassidy Garcia           Date of Birth: Jun 19, 1946           MRN: [id number] Visit Date: 07/20/2023 PCP: Geoffry Paradise, MD   Assessment & Plan:  Chief Complaint:  Chief Complaint  Patient presents with   Right Shoulder - Follow-up    04/26/23 right RSA   Visit Diagnoses: No diagnosis found.  Plan: Patient underwent right reverse shoulder replacement 04/26/2023.  Finished therapy on 06/15/2023.  She is doing well as long she does not overdo it.  Still hard for her to reach behind her back.  On examination she has excellent range of motion overhead with abduction easily going past 90 degrees. Plan at this time is to continue to work on stretching and strengthening exercises.  She will follow-up with [place] as needed.  She is happy with her result. Follow-Up Instructions: No follow-ups on file.   Orders:  No orders of the defined types were placed in this encounter.  No orders of the defined types were placed in this encounter.   Imaging: No results found.  PMFS History: 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   Rotator cuff tendonitis, right 07/02/2022   Nausea 12/02/2021   Bright red rectal bleeding 09/15/2021   History of colonic polyps 07/13/2021   Gastroenteritis 09/09/2020   Anemia 03/06/2020   Hypothyroidism 02/04/2020   Depression 01/24/2020   Asthma 01/24/2020   CKD (chronic kidney disease), stage III (HCC) 01/24/2020   Acute on chronic renal failure (HCC) 01/24/2020   Constipation 12/20/2018   Pain in right hip 06/29/2017   Trochanteric bursitis, right hip 06/29/2017   History of total replacement of right hip 06/29/2017   Hemorrhoids 03/01/2017   Great toe pain, left 12/04/2016   Chronic right shoulder pain 12/04/2016   Non-toxic multinodular goiter 05/14/2013    Degenerative arthritis of hip 12/10/2011   HTN (hypertension) 07/06/2011   Hyperlipidemia 07/06/2011   Gastro-esophageal reflux disease without esophagitis 09/23/2009   Past Medical History:  Diagnosis Date   Arthritis    Asthma    Bronchitis 03/2016   Chronic kidney disease    stage 3   Colon polyps    tubular adenoma   Constipation    Depression    pt denies   Diverticulosis    GERD (gastroesophageal reflux disease)    Hemorrhoids    History of hiatal hernia    Hyperlipidemia    Hypertension    Hypothyroidism    Shortness of breath    with exertion on occasion   Sinusitis    Skin cancer     Family History  Problem Relation Age of Onset   Ovarian cancer Mother    Anxiety disorder Mother    Heart disease Paternal Grandmother    Other Paternal Aunt        brain tumor   Birth defects Sister        no iris   Depression Sister    Anxiety disorder Sister    Colon cancer Neg Hx     Past Surgical History:  Procedure Laterality Date   ABDOMINAL HYSTERECTOMY     BICEPT TENODESIS Right 04/26/2023   Procedure: RIGHT BICEPS TENODESIS;  Surgeon: Cammy Copa, MD;  Location: [facility] OR;  Service: Orthopedics;  Laterality:  Right;   CARPOMETACARPAL (CMC) FUSION OF THUMB Bilateral    CATARACT EXTRACTION, BILATERAL     CHOLECYSTECTOMY     COLONOSCOPY  02/12/2011   HAMMER TOE SURGERY Right    LAPAROSCOPIC APPENDECTOMY N/A 08/26/2021   Procedure: APPENDECTOMY LAPAROSCOPIC WITH LYSIS OF ADHESIONS;  Surgeon: Karie Soda, MD;  Location: [facility];  Service: General;  Laterality: N/A;   NASAL SINUS SURGERY     POLYPECTOMY     REVERSE SHOULDER ARTHROPLASTY Right 04/26/2023   Procedure: RIGHT REVERSE SHOULDER ARTHROPLASTY;  Surgeon: Cammy Copa, MD;  Location: [facility] OR;  Service: Orthopedics;  Laterality: Right;   TOTAL HIP ARTHROPLASTY  12/10/2011   Procedure: TOTAL HIP ARTHROPLASTY ANTERIOR APPROACH;  Surgeon: Kathryne Hitch, MD;  Location: [facility];  Service: Orthopedics;   Laterality: Right;  Right Total Hip Arthroplasty, Anterior Approach   Social History   Occupational History   Occupation: retired    Associate Professor: RETIRED  Tobacco Use   Smoking status: Former    Current packs/day: 0.00    Types: Cigarettes    Quit date: 08/16/2001    Years since quitting: 21.9   Smokeless tobacco: Never  Vaping Use   Vaping status: Never Used  Substance and Sexual Activity   Alcohol use: Not Currently   Drug use: No   Sexual activity: Not on file

## 2023-07-26 ENCOUNTER — Other Ambulatory Visit: Payer: Self-pay

## 2023-07-26 ENCOUNTER — Encounter (HOSPITAL_BASED_OUTPATIENT_CLINIC_OR_DEPARTMENT_OTHER): Payer: Self-pay | Admitting: Physical Therapy

## 2023-07-26 ENCOUNTER — Ambulatory Visit (HOSPITAL_BASED_OUTPATIENT_CLINIC_OR_DEPARTMENT_OTHER): Payer: Medicare Other | Attending: Physical Medicine and Rehabilitation | Admitting: Physical Therapy

## 2023-07-26 DIAGNOSIS — M5459 Other low back pain: Secondary | ICD-10-CM | POA: Diagnosis not present

## 2023-07-26 DIAGNOSIS — M5416 Radiculopathy, lumbar region: Secondary | ICD-10-CM | POA: Diagnosis not present

## 2023-07-26 DIAGNOSIS — R293 Abnormal posture: Secondary | ICD-10-CM | POA: Diagnosis not present

## 2023-07-26 DIAGNOSIS — M25511 Pain in right shoulder: Secondary | ICD-10-CM | POA: Diagnosis not present

## 2023-07-26 DIAGNOSIS — M545 Low back pain, unspecified: Secondary | ICD-10-CM | POA: Diagnosis present

## 2023-07-26 DIAGNOSIS — R2681 Unsteadiness on feet: Secondary | ICD-10-CM | POA: Insufficient documentation

## 2023-07-26 NOTE — Therapy (Signed)
OUTPATIENT PHYSICAL THERAPY THORACOLUMBAR EVALUATION   Patient Name: Cassidy Garcia MRN: [id number] DOB:Jan 06, 1946, 77 y.o., female Today's Date: 07/26/2023  END OF SESSION:  PT End of Session - 07/26/23 1228     Visit Number 1    Number of Visits 16    Date for PT Re-Evaluation 09/23/23    Authorization Type mdr A&B    Progress Note Due on Visit 10    PT Start Time 1120    PT Stop Time 1200    PT Time Calculation (min) 40 min    Activity Tolerance Patient tolerated treatment well    Behavior During Therapy [facility] for tasks assessed/performed             Past Medical History:  Diagnosis Date   Arthritis    Asthma    Bronchitis 03/2016   Chronic kidney disease    stage 3   Colon polyps    tubular adenoma   Constipation    Depression    pt denies   Diverticulosis    GERD (gastroesophageal reflux disease)    Hemorrhoids    History of hiatal hernia    Hyperlipidemia    Hypertension    Hypothyroidism    Shortness of breath    with exertion on occasion   Sinusitis    Skin cancer    Past Surgical History:  Procedure Laterality Date   ABDOMINAL HYSTERECTOMY     BICEPT TENODESIS Right 04/26/2023   Procedure: RIGHT BICEPS TENODESIS;  Surgeon: Cammy Copa, MD;  Location: [facility] OR;  Service: Orthopedics;  Laterality: Right;   CARPOMETACARPAL (CMC) FUSION OF THUMB Bilateral    CATARACT EXTRACTION, BILATERAL     CHOLECYSTECTOMY     COLONOSCOPY  02/12/2011   HAMMER TOE SURGERY Right    LAPAROSCOPIC APPENDECTOMY N/A 08/26/2021   Procedure: APPENDECTOMY LAPAROSCOPIC WITH LYSIS OF ADHESIONS;  Surgeon: Karie Soda, MD;  Location: [facility];  Service: General;  Laterality: N/A;   NASAL SINUS SURGERY     POLYPECTOMY     REVERSE SHOULDER ARTHROPLASTY Right 04/26/2023   Procedure: RIGHT REVERSE SHOULDER ARTHROPLASTY;  Surgeon: Cammy Copa, MD;  [facility];  Service: Orthopedics;  Laterality: Right;   TOTAL HIP ARTHROPLASTY  12/10/2011   Procedure: TOTAL HIP  ARTHROPLASTY ANTERIOR APPROACH;  Surgeon: Kathryne Hitch, MD;  Location: [facility];  Service: Orthopedics;  Laterality: Right;  Right Total Hip Arthroplasty, Anterior Approach   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   Rotator cuff tendonitis, right 07/02/2022   Nausea 12/02/2021   Bright red rectal bleeding 09/15/2021   History of colonic polyps 07/13/2021   Gastroenteritis 09/09/2020   Anemia 03/06/2020   Hypothyroidism 02/04/2020   Depression 01/24/2020   Asthma 01/24/2020   CKD (chronic kidney disease), stage III (HCC) 01/24/2020   Acute on chronic renal failure (HCC) 01/24/2020   Constipation 12/20/2018   Pain in right hip 06/29/2017   Trochanteric bursitis, right hip 06/29/2017   History of total replacement of right hip 06/29/2017   Hemorrhoids 03/01/2017   Great toe pain, left 12/04/2016   Chronic right shoulder pain 12/04/2016   Non-toxic multinodular goiter 05/14/2013   Degenerative arthritis of hip 12/10/2011   HTN (hypertension) 07/06/2011   Hyperlipidemia 07/06/2011   Gastro-esophageal reflux disease without esophagitis 09/23/2009    PCP: Geoffry Paradise MD  REFERRING  PROVIDER: Juanda Chance, NP   REFERRING DIAG: [phone] (ICD-10-CM) - Lumbar radiculopathy   Rationale for Evaluation and Treatment: Rehabilitation  THERAPY DIAG:  Other low back pain  Unsteadiness on feet  Abnormal posture  ONSET DATE: <5 yrs  SUBJECTIVE:                                                                                                                                                                                           SUBJECTIVE STATEMENT: Inj 8/24 epidural.  Have had 5-6 injections and help a little.  Can sit for a few minutes and get up like it resets. I do not use a cane.  Can lean on a grocery cart  and walk through store for up to 30 minute. I have been having an issue with balance. [facility] Pt does have access to [facility] but reports she has not used.  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 Low back and right buttock pain. 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 Yes: NPRS scale: 1/10; worst: 6/10;  least 0/10 Pain location: lumbar spine are to hips and occasionally into r hip Pain description: burning, severe muscle pain Aggravating factors: standing, walking 5-10 minutes Relieving factors: meds; lidocaine patches, sitting  PRECAUTIONS: None  RED FLAGS: None   WEIGHT BEARING RESTRICTIONS: No  FALLS:  Has patient fallen in last 6 months? No  LIVING ENVIRONMENT: Lives with: lives alone   OCCUPATION:  Retired   PLOF:  Independent and Leisure: watch TV, puzzles  PATIENT GOALS: stand and walk for more than 10 minutes without cart or UE support; decrease pain.  NEXT MD VISIT: q 3-5 months  OBJECTIVE:  Note: Objective measures were completed at Evaluation unless otherwise noted.  DIAGNOSTIC FINDINGS:  6/23 Xray lumbar IMPRESSION: 1. 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 2. Multilevel neural foraminal narrowing severe on the left at L3-4 and moderate on the left at L2-3 and L4-5.  PATIENT SURVEYS:  FOTO Primary measure 42% with goal of 52% at 12th visit  COGNITION: Overall cognitive status: Within functional limits for tasks assessed     SENSATION: WFL  MUSCLE LENGTH: Hamstrings: WFL tested in sitting   POSTURE: rounded shoulders, forward head, and decreased lumbar lordosis  PALPATION: TTP lumbar spine paraspinals and multifidi bilaterally, glutes right sided only  LUMBAR  ROM:   Full  LOWER EXTREMITY strength:  Strength Right eval Left eval  Hip flexion 23.6 35.5  Hip extension    Hip abduction 22.4 27.4  Hip adduction    Hip internal rotation    Hip external rotation    Knee flexion    Knee extension 29.6 23.2  Ankle dorsiflexion    Ankle plantarflexion    Ankle inversion    Ankle eversion     (Blank rows = not tested)  LOWER EXTREMITY ROM:    WFL  LUMBAR SPECIAL TESTS:  Slump test: Negative  FUNCTIONAL TESTS:  5 times sit to stand: from pool bench 14.99 Timed up and go (TUG): 15.22 Berg 32/56 2 mwt 362ft  GAIT: Distance walked: 500 Assistive device utilized: None Level of assistance: Complete Independence Comments: guarded posture decreased arm swing, limited hip flex  TODAY'S TREATMENT:                                                                                                                              Eval    PATIENT EDUCATION:  Education details: Discussed eval findings, rehab rationale, aquatic program progression/POC and pools in area. Patient is in agreement  Person educated: Patient Education method: Explanation Education comprehension: verbalized understanding  HOME EXERCISE PROGRAM: TBA  ASSESSMENT:  CLINICAL IMPRESSION: Patient is a 77 y.o. f who was seen today for physical therapy evaluation and treatment for LBP. She presents with moderate pain sensitivity throughout lumbar spine through bilat hips with radicular pattern into right buttock. Main complaint is being limited primarily with amb >10 minutes unsupported.  She is not currently using a cane for amb.  Does tolerate walking through stores push a cart.  Pt demonstrates significant hip weakness bilaterally and is a high fall risk as per Sharlene Motts Balance scale. She will benefit from skilled PT both aquatic and land based in order to improve function and safety.  OBJECTIVE IMPAIRMENTS: Abnormal gait, decreased activity tolerance, decreased balance,  decreased knowledge of use of DME, decreased mobility, difficulty walking, decreased strength, improper body mechanics, postural dysfunction, obesity, and pain.   ACTIVITY LIMITATIONS: carrying, lifting, standing, transfers, and locomotion level  PARTICIPATION LIMITATIONS: meal prep, cleaning, laundry, driving, shopping, and community activity   PERSONAL FACTORS:  3+ comorbidities: OA, asthma, depression, HTN  are also affecting patient's functional outcome.   REHAB POTENTIAL: Good  CLINICAL DECISION MAKING: Evolving/moderate complexity  EVALUATION COMPLEXITY: Moderate   GOALS: Goals reviewed with patient? Yes  SHORT TERM GOALS: Target date: 08/20/23  Pt will tolerate full aquatic sessions consistently without increase in pain and with improving function to demonstrate good toleration and effectiveness of intervention.  Baseline: Goal status: INITIAL  2.  Pt will tolerate walking to and from setting along with an entire aquatic session without excessive fatigue or pain To demonstrate improved toleration to activity. Baseline:  Goal status: INITIAL  3.  Pt will improve on 5 X STS test to <or=    to demonstrate improving functional lower extremity strength,  transitional movements, and balance Baseline: 14.99 Goal status: INITIAL  4.  Pt will complete tandem stance and SLS in 3.6 ft holding positions x >15 s without ue support Baseline:  Goal status: INITIAL  5. Pt will improve on Tug test to <or= 13s  to demonstrate improvement in lower extremity function, mobility and decreased fall risk. Baseline: 15.22 Goal status: INITIAL      LONG TERM GOALS: Target date: 09/23/23  Pt to meet stated Foto Goal Baseline:  Goal status: INITIAL  2.  Pt to report improved ability to step up into SUV safely. Baseline:  Goal status: INITIAL  3.  Pt will be indep with final HEP's (land and aquatic as appropriate) for continued management of condition Baseline:  Goal status: INITIAL  4.   Pt will improve strength in bilateral hips by at least 10lbs to demonstrate improved overall physical function  Baseline:  Goal status: INITIAL  5.  Pt will improve on Berg balance test to >/= 50/56 to demonstrate a decrease in fall risk. Baseline: 32/56 Goal status: INITIAL  6.  Pt will amb community distances with or without ad without limitation to pain Baseline:  Goal status: INITIAL  PLAN:  PT FREQUENCY: 1-2x/week  PT DURATION: 8 weeks  PLANNED INTERVENTIONS: 97164- PT Re-evaluation, 97110-Therapeutic exercises, 97530- Therapeutic activity, 97112- Neuromuscular re-education, 97535- Self Care, 16109- Manual therapy, [phone]- Gait training, [phone]- Orthotic Fit/training, [phone]- Aquatic Therapy, [phone]- Ionotophoresis 4mg /ml Dexamethasone, Patient/Family education, Balance training, Stair training, Taping, Dry Needling, Joint mobilization, DME instructions, Cryotherapy, and Moist heat.  PLAN FOR NEXT SESSION: Aquatic: core and hip strengthening/ROM; balance retraining; posture retraining Land:Gait tolerance, posture and body mechanics, strengthening and stretching; HEP   Rushie Chestnut) Melissia Lahman MPT 07/26/23 12:43 PM [facility] [address] [place], [place], [address] Phone: [phone]   Fax:  [phone]

## 2023-07-28 ENCOUNTER — Encounter (HOSPITAL_BASED_OUTPATIENT_CLINIC_OR_DEPARTMENT_OTHER): Payer: Self-pay | Admitting: Physical Therapy

## 2023-07-28 ENCOUNTER — Ambulatory Visit (HOSPITAL_BASED_OUTPATIENT_CLINIC_OR_DEPARTMENT_OTHER): Payer: Medicare Other | Admitting: Physical Therapy

## 2023-07-28 DIAGNOSIS — M5416 Radiculopathy, lumbar region: Secondary | ICD-10-CM | POA: Diagnosis not present

## 2023-07-28 DIAGNOSIS — M5459 Other low back pain: Secondary | ICD-10-CM

## 2023-07-28 DIAGNOSIS — R293 Abnormal posture: Secondary | ICD-10-CM

## 2023-07-28 DIAGNOSIS — M25511 Pain in right shoulder: Secondary | ICD-10-CM | POA: Diagnosis not present

## 2023-07-28 DIAGNOSIS — R2681 Unsteadiness on feet: Secondary | ICD-10-CM

## 2023-07-28 NOTE — Therapy (Signed)
OUTPATIENT PHYSICAL THERAPY THORACOLUMBAR EVALUATION   Patient Name: Cassidy Garcia MRN: [id number] DOB:[birth year], 77 y.o., female Today's Date: 07/28/2023  END OF SESSION:  PT End of Session - 07/28/23 1516     Visit Number 2    Number of Visits 16    Date for PT Re-Evaluation 09/23/23    Authorization Type mdr A&B    Progress Note Due on Visit 10    PT Start Time 1520    PT Stop Time 1605    PT Time Calculation (min) 45 min    Activity Tolerance Patient tolerated treatment well    Behavior During Therapy [facility] for tasks assessed/performed             Past Medical History:  Diagnosis Date   Arthritis    Asthma    Bronchitis 03/2016   Chronic kidney disease    stage 3   Colon polyps    tubular adenoma   Constipation    Depression    pt denies   Diverticulosis    GERD (gastroesophageal reflux disease)    Hemorrhoids    History of hiatal hernia    Hyperlipidemia    Hypertension    Hypothyroidism    Shortness of breath    with exertion on occasion   Sinusitis    Skin cancer    Past Surgical History:  Procedure Laterality Date   ABDOMINAL HYSTERECTOMY     BICEPT TENODESIS Right 04/26/2023   Procedure: RIGHT BICEPS TENODESIS;  Surgeon: Cammy Copa, MD;  Location: [facility] OR;  Service: Orthopedics;  Laterality: Right;   CARPOMETACARPAL (CMC) FUSION OF THUMB Bilateral    CATARACT EXTRACTION, BILATERAL     CHOLECYSTECTOMY     COLONOSCOPY  02/12/2011   HAMMER TOE SURGERY Right    LAPAROSCOPIC APPENDECTOMY N/A 08/26/2021   Procedure: APPENDECTOMY LAPAROSCOPIC WITH LYSIS OF ADHESIONS;  Surgeon: Karie Soda, MD;  Location: [facility] ORS;  Service: General;  Laterality: N/A;   NASAL SINUS SURGERY     POLYPECTOMY     REVERSE SHOULDER ARTHROPLASTY Right 04/26/2023   Procedure: RIGHT REVERSE SHOULDER ARTHROPLASTY;  Surgeon: Cammy Copa, MD;  Location: [facility] OR;  Service: Orthopedics;  Laterality: Right;   TOTAL HIP ARTHROPLASTY  12/10/2011   Procedure: TOTAL HIP  ARTHROPLASTY ANTERIOR APPROACH;  Surgeon: Kathryne Hitch, MD;  Location: [facility] ORS;  Service: Orthopedics;  Laterality: Right;  Right Total Hip Arthroplasty, Anterior Approach   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   Rotator cuff tendonitis, right 07/02/2022   Nausea 12/02/2021   Bright red rectal bleeding 09/15/2021   History of colonic polyps 07/13/2021   Gastroenteritis 09/09/2020   Anemia 03/06/2020   Hypothyroidism 02/04/2020   Depression 01/24/2020   Asthma 01/24/2020   CKD (chronic kidney disease), stage III (HCC) 01/24/2020   Acute on chronic renal failure (HCC) 01/24/2020   Constipation 12/20/2018   Pain in right hip 06/29/2017   Trochanteric bursitis, right hip 06/29/2017   History of total replacement of right hip 06/29/2017   Hemorrhoids 03/01/2017   Great toe pain, left 12/04/2016   Chronic right shoulder pain 12/04/2016   Non-toxic multinodular goiter 05/14/2013   Degenerative arthritis of hip 12/10/2011   HTN (hypertension) 07/06/2011   Hyperlipidemia 07/06/2011   Gastro-esophageal reflux disease without esophagitis 09/23/2009    PCP: Geoffry Paradise MD  REFERRING  PROVIDER: Juanda Chance, NP   REFERRING DIAG: [phone] (ICD-10-CM) - Lumbar radiculopathy   Rationale for Evaluation and Treatment: Rehabilitation  THERAPY DIAG:  Other low back pain  Unsteadiness on feet  Abnormal posture  ONSET DATE: <5 yrs  SUBJECTIVE:                                                                                                                                                                                           SUBJECTIVE STATEMENT: Spent the morning doing Christmas acards, back is ok today, haven't taken any meds"  Initial Subjective Inj 8/24 epidural.  Have had 5-6 injections and help a  little.  Can sit for a few minutes and get up like it resets. I do not use a cane.  Can lean on a grocery cart and walk through store for up to 30 minute. I have been having an issue with balance. [facility] Pt does have access to [facility] but reports she has not used.  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 Low back and right buttock pain. 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 Yes: NPRS scale: 3/10; worst: 6/10;  least 0/10 Pain location: lumbar spine are to hips and occasionally into r hip Pain description: burning, severe muscle pain Aggravating factors: standing, walking 5-10 minutes Relieving factors: meds; lidocaine patches, sitting  PRECAUTIONS: None  RED FLAGS: None   WEIGHT BEARING RESTRICTIONS: No  FALLS:  Has patient fallen in last 6 months? No  LIVING ENVIRONMENT: Lives with: lives alone   OCCUPATION:  Retired   PLOF:  Independent and Leisure: watch TV, puzzles  PATIENT GOALS: stand and walk for more than 10 minutes without cart or UE support; decrease pain.  NEXT MD VISIT: q 3-5 months  OBJECTIVE:  Note: Objective measures were completed at Evaluation unless otherwise noted.  DIAGNOSTIC FINDINGS:  6/23 Xray lumbar IMPRESSION: 1. 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 2. Multilevel neural foraminal narrowing severe on the left at L3-4 and moderate on the left at L2-3 and L4-5.  PATIENT SURVEYS:  FOTO Primary measure 42% with goal of 52% at 12th visit  COGNITION: Overall cognitive status: Within functional limits for tasks assessed     SENSATION: WFL  MUSCLE LENGTH: Hamstrings: WFL tested in sitting   POSTURE: rounded shoulders, forward head, and decreased lumbar  lordosis  PALPATION: TTP lumbar spine paraspinals  and multifidi bilaterally, glutes right sided only  LUMBAR ROM:   Full  LOWER EXTREMITY strength:     Strength Right eval Left eval  Hip flexion 23.6 35.5  Hip extension    Hip abduction 22.4 27.4  Hip adduction    Hip internal rotation    Hip external rotation    Knee flexion    Knee extension 29.6 23.2  Ankle dorsiflexion    Ankle plantarflexion    Ankle inversion    Ankle eversion     (Blank rows = not tested)  LOWER EXTREMITY ROM:    WFL  LUMBAR SPECIAL TESTS:  Slump test: Negative  FUNCTIONAL TESTS:  5 times sit to stand: from pool bench 14.99 Timed up and go (TUG): 15.22 Berg 32/56 2 mwt 340ft  GAIT: Distance walked: 500 Assistive device utilized: None Level of assistance: Complete Independence Comments: guarded posture decreased arm swing, limited hip flex  TODAY'S TREATMENT:                                                                                                                              Pt seen for aquatic therapy today.  Treatment took place in water 3.5-4.75 ft in depth at the [facility] pool. Temp of water was 91.  Pt entered/exited the pool via stairs and step to pattern with hand rail.  *Intro to setting *walking *HB carry rainbow bilater forward and back x 2 widths (causes right shoulder pain)->unilaterally lue side stepping x 2 widths *staggered stance: rainbow HB shoulder add/abd *Squatted to shoulder depths: Ue flex/ext; add/abd; right shoulder IR/ER *L stretch on steps holding hand rail x 3 *3 way stretch standing R/L x 2 in ea position *straddling noodle: cycling; hip add/abd; hip flex/ext *standing ue support yellow HB: hip flex/ext x 10  Pt requires the buoyancy and hydrostatic pressure of water for support, and to offload joints by unweighting joint load by at least 50 % in navel deep water and by at least 75-80% in chest to neck deep water.  Viscosity of the water  is needed for resistance of strengthening. Water current perturbations provides challenge to standing balance requiring increased core activation.      PATIENT EDUCATION:  Education details: Discussed eval findings, rehab rationale, aquatic program progression/POC and pools in area. Patient is in agreement  Person educated: Patient Education method: Explanation Education comprehension: verbalized understanding  HOME EXERCISE PROGRAM: TBA  ASSESSMENT:  CLINICAL IMPRESSION: Pt demonstrates safety and indep in setting with therapist instructing from deck.  She demonstrates confidence and skill submerged finding COB quickly.  She is given vc and demonstration for execution of exercises.  She does complain of mod right shoulder pain with HB carry decreasing ability to complete some core engagement exercises. Anticipate toleration will improve next session as she reports irritating newly replaced shoulder today by carrying boxes. She tolerates session otherwise very well. Pain level low at onset of session and lower upon completion.  Goals ongoing   Initial Impression Patient is a 77 y.o. f who was seen today for physical therapy evaluation and treatment for LBP. She presents with moderate pain sensitivity throughout lumbar spine through bilat hips with radicular pattern into right buttock. Main complaint is being limited primarily with amb >10 minutes unsupported.  She is not currently using a cane for amb.  Does tolerate walking through stores push a cart.  Pt demonstrates significant hip weakness bilaterally and is a high fall risk as per Sharlene Motts Balance scale. She will benefit from skilled PT both aquatic and land based in order to improve function and safety.  OBJECTIVE IMPAIRMENTS: Abnormal gait, decreased activity tolerance, decreased balance, decreased knowledge of use of DME, decreased mobility, difficulty walking, decreased strength, improper body mechanics, postural dysfunction, obesity, and  pain.   ACTIVITY LIMITATIONS: carrying, lifting, standing, transfers, and locomotion level  PARTICIPATION LIMITATIONS: meal prep, cleaning, laundry, driving, shopping, and community activity   PERSONAL FACTORS:  3+ comorbidities: OA, asthma, depression, HTN  are also affecting patient's functional outcome.   REHAB POTENTIAL: Good  CLINICAL DECISION MAKING: Evolving/moderate complexity  EVALUATION COMPLEXITY: Moderate   GOALS: Goals reviewed with patient? Yes  SHORT TERM GOALS: Target date: 08/20/23  Pt will tolerate full aquatic sessions consistently without increase in pain and with improving function to demonstrate good toleration and effectiveness of intervention.  Baseline: Goal status: INITIAL  2.  Pt will tolerate walking to and from setting along with an entire aquatic session without excessive fatigue or pain To demonstrate improved toleration to activity. Baseline:  Goal status: INITIAL  3.  Pt will improve on 5 X STS test to <or=    to demonstrate improving functional lower extremity strength, transitional movements, and balance Baseline: 14.99 Goal status: INITIAL  4.  Pt will complete tandem stance and SLS in 3.6 ft holding positions x >15 s without ue support Baseline:  Goal status: INITIAL  5. Pt will improve on Tug test to <or= 13s  to demonstrate improvement in lower extremity function, mobility and decreased fall risk. Baseline: 15.22 Goal status: INITIAL      LONG TERM GOALS: Target date: 09/23/23  Pt to meet stated Foto Goal Baseline:  Goal status: INITIAL  2.  Pt to report improved ability to step up into SUV safely. Baseline:  Goal status: INITIAL  3.  Pt will be indep with final HEP's (land and aquatic as appropriate) for continued management of condition Baseline:  Goal status: INITIAL  4.  Pt will improve strength in bilateral hips by at least 10lbs to demonstrate improved overall physical function  Baseline:  Goal status: INITIAL  5.   Pt will improve on Berg balance test to >/= 50/56 to demonstrate a decrease in fall risk. Baseline: 32/56 Goal status: INITIAL  6.  Pt will amb community distances with or without ad without limitation to pain Baseline:  Goal status: INITIAL  PLAN:  PT FREQUENCY: 1-2x/week  PT DURATION: 8 weeks  PLANNED INTERVENTIONS: 97164- PT Re-evaluation, 97110-Therapeutic exercises, 97530- Therapeutic activity, 97112- Neuromuscular re-education, 97535- Self Care, 40981- Manual therapy, [phone]- Gait training, [phone]- Orthotic Fit/training, [phone]- Aquatic Therapy, [phone]- Ionotophoresis 4mg /ml Dexamethasone, Patient/Family education, Balance training, Stair training, Taping, Dry Needling, Joint mobilization, DME instructions, Cryotherapy, and Moist heat.  PLAN FOR NEXT SESSION: Aquatic: core and hip strengthening/ROM; balance retraining; posture retraining Land:Gait tolerance, posture and body mechanics, strengthening and stretching; HEP   Corrie Dandy (Frankie) Tamaira Ciriello MPT 07/28/23 3:25 PM [facility] [phone]  [place], [place], [address] Phone: [phone]   Fax:  [phone]

## 2023-08-02 ENCOUNTER — Encounter (HOSPITAL_BASED_OUTPATIENT_CLINIC_OR_DEPARTMENT_OTHER): Payer: Self-pay | Admitting: Physical Therapy

## 2023-08-02 ENCOUNTER — Ambulatory Visit (HOSPITAL_BASED_OUTPATIENT_CLINIC_OR_DEPARTMENT_OTHER): Payer: Medicare Other | Admitting: Physical Therapy

## 2023-08-02 DIAGNOSIS — M5416 Radiculopathy, lumbar region: Secondary | ICD-10-CM | POA: Diagnosis not present

## 2023-08-02 DIAGNOSIS — M5459 Other low back pain: Secondary | ICD-10-CM | POA: Diagnosis not present

## 2023-08-02 DIAGNOSIS — R293 Abnormal posture: Secondary | ICD-10-CM

## 2023-08-02 DIAGNOSIS — M25511 Pain in right shoulder: Secondary | ICD-10-CM | POA: Diagnosis not present

## 2023-08-02 DIAGNOSIS — R2681 Unsteadiness on feet: Secondary | ICD-10-CM | POA: Diagnosis not present

## 2023-08-02 NOTE — Therapy (Signed)
OUTPATIENT PHYSICAL THERAPY THORACOLUMBAR EVALUATION   Patient Name: Cassidy Garcia MRN: [id number] DOB:1946/04/30, 77 y.o., female Today's Date: 08/02/2023  END OF SESSION:  PT End of Session - 08/02/23 1157     Visit Number 3    Number of Visits 16    Date for PT Re-Evaluation 09/23/23    Authorization Type mdr A&B    Progress Note Due on Visit 10    PT Start Time 1200    PT Stop Time 1245    PT Time Calculation (min) 45 min    Activity Tolerance Patient tolerated treatment well    Behavior During Therapy [facility] for tasks assessed/performed             Past Medical History:  Diagnosis Date   Arthritis    Asthma    Bronchitis 03/2016   Chronic kidney disease    stage 3   Colon polyps    tubular adenoma   Constipation    Depression    pt denies   Diverticulosis    GERD (gastroesophageal reflux disease)    Hemorrhoids    History of hiatal hernia    Hyperlipidemia    Hypertension    Hypothyroidism    Shortness of breath    with exertion on occasion   Sinusitis    Skin cancer    Past Surgical History:  Procedure Laterality Date   ABDOMINAL HYSTERECTOMY     BICEPT TENODESIS Right 04/26/2023   Procedure: RIGHT BICEPS TENODESIS;  Surgeon: Cammy Copa, MD;  Location: [facility] OR;  Service: Orthopedics;  Laterality: Right;   CARPOMETACARPAL (CMC) FUSION OF THUMB Bilateral    CATARACT EXTRACTION, BILATERAL     CHOLECYSTECTOMY     COLONOSCOPY  02/12/2011   HAMMER TOE SURGERY Right    LAPAROSCOPIC APPENDECTOMY N/A 08/26/2021   Procedure: APPENDECTOMY LAPAROSCOPIC WITH LYSIS OF ADHESIONS;  Surgeon: Karie Soda, MD;  Location: [facility] ORS;  Service: General;  Laterality: N/A;   NASAL SINUS SURGERY     POLYPECTOMY     REVERSE SHOULDER ARTHROPLASTY Right 04/26/2023   Procedure: RIGHT REVERSE SHOULDER ARTHROPLASTY;  Surgeon: Cammy Copa, MD;  Location: [facility] OR;  Service: Orthopedics;  Laterality: Right;   TOTAL HIP ARTHROPLASTY  12/10/2011   Procedure: TOTAL HIP  ARTHROPLASTY ANTERIOR APPROACH;  Surgeon: Kathryne Hitch, MD;  Location: [facility] ORS;  Service: Orthopedics;  Laterality: Right;  Right Total Hip Arthroplasty, Anterior Approach   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   Rotator cuff tendonitis, right 07/02/2022   Nausea 12/02/2021   Bright red rectal bleeding 09/15/2021   History of colonic polyps 07/13/2021   Gastroenteritis 09/09/2020   Anemia 03/06/2020   Hypothyroidism 02/04/2020   Depression 01/24/2020   Asthma 01/24/2020   CKD (chronic kidney disease), stage III (HCC) 01/24/2020   Acute on chronic renal failure (HCC) 01/24/2020   Constipation 12/20/2018   Pain in right hip 06/29/2017   Trochanteric bursitis, right hip 06/29/2017   History of total replacement of right hip 06/29/2017   Hemorrhoids 03/01/2017   Great toe pain, left 12/04/2016   Chronic right shoulder pain 12/04/2016   Non-toxic multinodular goiter 05/14/2013   Degenerative arthritis of hip 12/10/2011   HTN (hypertension) 07/06/2011   Hyperlipidemia 07/06/2011   Gastro-esophageal reflux disease without esophagitis 09/23/2009    PCP: Geoffry Paradise MD  REFERRING  PROVIDER: Juanda Chance, NP   REFERRING DIAG: [phone] (ICD-10-CM) - Lumbar radiculopathy   Rationale for Evaluation and Treatment: Rehabilitation  THERAPY DIAG:  Other low back pain  Unsteadiness on feet  Abnormal posture  ONSET DATE: <5 yrs  SUBJECTIVE:                                                                                                                                                                                           SUBJECTIVE STATEMENT: Shoulder pain 0/10, did ok after last treatment not real tired or hurting"  Initial Subjective Inj 8/24 epidural.  Have had 5-6 injections and help a little.  Can  sit for a few minutes and get up like it resets. I do not use a cane.  Can lean on a grocery cart and walk through store for up to 30 minute. I have been having an issue with balance. [facility] Pt does [facility] to [facility] but reports Cassidy Garcia has not used.  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 Low back and right buttock pain. 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 Yes: NPRS scale: 3/10; worst: 6/10;  least 0/10 Pain location: lumbar spine are to hips and occasionally into r hip Pain description: burning, severe muscle pain Aggravating factors: standing, walking 5-10 minutes Relieving factors: meds; lidocaine patches, sitting  PRECAUTIONS: None  RED FLAGS: None   WEIGHT BEARING RESTRICTIONS: No  FALLS:  Has patient fallen in last 6 months? No  LIVING ENVIRONMENT: Lives with: lives alone   OCCUPATION:  Retired   PLOF:  Independent and Leisure: watch TV, puzzles  PATIENT GOALS: stand and walk for more than 10 minutes without cart or UE support; decrease pain.  NEXT MD VISIT: q 3-5 months  OBJECTIVE:  Note: Objective measures were completed at Evaluation unless otherwise noted.  DIAGNOSTIC FINDINGS:  6/23 Xray lumbar IMPRESSION: 1. 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 2. Multilevel neural foraminal narrowing severe on the left at L3-4 and moderate on the left at L2-3 and L4-5.  PATIENT SURVEYS:  FOTO Primary measure 42% with goal of 52% at 12th visit  COGNITION: Overall cognitive status: Within functional limits for tasks assessed     SENSATION: WFL  MUSCLE LENGTH: Hamstrings: WFL tested in sitting   POSTURE: rounded shoulders, forward head, and decreased lumbar  lordosis  PALPATION: TTP lumbar spine paraspinals and  multifidi bilaterally, glutes right sided only  LUMBAR ROM:   Full  LOWER EXTREMITY strength:     Strength Right eval Left eval  Hip flexion 23.6 35.5  Hip extension    Hip abduction 22.4 27.4  Hip adduction    Hip internal rotation    Hip external rotation    Knee flexion    Knee extension 29.6 23.2  Ankle dorsiflexion    Ankle plantarflexion    Ankle inversion    Ankle eversion     (Blank rows = not tested)  LOWER EXTREMITY ROM:    WFL  LUMBAR SPECIAL TESTS:  Slump test: Negative  FUNCTIONAL TESTS:  5 times sit to stand: from pool bench 14.99 Timed up and go (TUG): 15.22 Berg 32/56 2 mwt 385ft  GAIT: Distance walked: 500 Assistive device utilized: None Level of assistance: Complete Independence Comments: guarded posture decreased arm swing, limited hip flex  TODAY'S TREATMENT:                                                                                                                              Pt seen for aquatic therapy today.  Treatment took place in water 3.5-4.75 ft in depth at the [facility] pool. Temp of water was 91.  Pt entered/exited the pool via stairs and step to pattern with hand rail.   *walking forward/back and side stepping with UE swing *green bells shoulder abd/add; flex/ext; right shoulder IR/ER *unilateral left HB carry rainbow  forward and back x 2 widths ea *TrA set using 1/2 noodle pull down *Solid Noodle stomp 10 slow then 10 fast (once motor plan established) R/L *standing ue support yellow HB: DF; PF; high knee flex; hip flex/ext x 10 *hip hinge: vc and demonstration for execution.  Cues for glut isometric upon standing. x8 *straddling noodle: cycling; hip add/abd; hip flex/ext   Pt requires the buoyancy and hydrostatic pressure of water for support, and to offload joints by unweighting joint load by at least 50 % in navel deep water and by at least 75-80% in  chest to neck deep water.  Viscosity of the water is needed for resistance of strengthening. Water current perturbations provides challenge to standing balance requiring increased core activation.      PATIENT EDUCATION:  Education details: Discussed eval findings, rehab rationale, aquatic program progression/POC and pools in area. Patient is in agreement  Person educated: Patient Education method: Explanation Education comprehension: verbalized understanding  HOME EXERCISE PROGRAM: TBA  ASSESSMENT:  CLINICAL IMPRESSION: Adjusted exercises today to avoid overuse of rue (recent total shoulder) with core engagement. Cassidy Garcia has good toleration to use of LE with core activation although does need extra reps to gain motor plan. Good execution hip hinging allowing for good LB stretch. Goals ongoing.     Initial Impression Patient is a 77 y.o. f who was seen today for physical therapy evaluation and treatment for LBP. Cassidy Garcia presents with moderate pain sensitivity throughout  lumbar spine through bilat hips with radicular pattern into right buttock. Main complaint is being limited primarily with amb >10 minutes unsupported.  Cassidy Garcia is not currently using a cane for amb.  Does tolerate walking through stores push a cart.  Pt demonstrates significant hip weakness bilaterally and is a high fall risk as per Sharlene Motts Balance scale. Cassidy Garcia will benefit from skilled PT both aquatic and land based in order to improve function and safety.  OBJECTIVE IMPAIRMENTS: Abnormal gait, decreased activity tolerance, decreased balance, decreased knowledge of use of DME, decreased mobility, difficulty walking, decreased strength, improper body mechanics, postural dysfunction, obesity, and pain.   ACTIVITY LIMITATIONS: carrying, lifting, standing, transfers, and locomotion level  PARTICIPATION LIMITATIONS: meal prep, cleaning, laundry, driving, shopping, and community activity   PERSONAL FACTORS:  3+ comorbidities: OA, asthma,  depression, HTN  are also affecting patient's functional outcome.   REHAB POTENTIAL: Good  CLINICAL DECISION MAKING: Evolving/moderate complexity  EVALUATION COMPLEXITY: Moderate   GOALS: Goals reviewed with patient? Yes  SHORT TERM GOALS: Target date: 08/20/23  Pt will tolerate full aquatic sessions consistently without increase in pain and with improving function to demonstrate good toleration and effectiveness of intervention.  Baseline: Goal status: INITIAL  2.  Pt will tolerate walking to and from setting along with an entire aquatic session without excessive fatigue or pain To demonstrate improved toleration to activity. Baseline:  Goal status: INITIAL  3.  Pt will improve on 5 X STS test to <or=    to demonstrate improving functional lower extremity strength, transitional movements, and balance Baseline: 14.99 Goal status: INITIAL  4.  Pt will complete tandem stance and SLS in 3.6 ft holding positions x >15 s without ue support Baseline:  Goal status: INITIAL  5. Pt will improve on Tug test to <or= 13s  to demonstrate improvement in lower extremity function, mobility and decreased fall risk. Baseline: 15.22 Goal status: INITIAL      LONG TERM GOALS: Target date: 09/23/23  Pt to meet stated Foto Goal Baseline:  Goal status: INITIAL  2.  Pt to report improved ability to step up into SUV safely. Baseline:  Goal status: INITIAL  3.  Pt will be indep with final HEP's (land and aquatic as appropriate) for continued management of condition Baseline:  Goal status: INITIAL  4.  Pt will improve strength in bilateral hips by at least 10lbs to demonstrate improved overall physical function  Baseline:  Goal status: INITIAL  5.  Pt will improve on Berg balance test to >/= 50/56 to demonstrate a decrease in fall risk. Baseline: 32/56 Goal status: INITIAL  6.  Pt will amb community distances with or without ad without limitation to pain Baseline:  Goal status:  INITIAL  PLAN:  PT FREQUENCY: 1-2x/week  PT DURATION: 8 weeks  PLANNED INTERVENTIONS: 97164- PT Re-evaluation, 97110-Therapeutic exercises, 97530- Therapeutic activity, 97112- Neuromuscular re-education, 97535- Self Care, 78295- Manual therapy, [phone]- Gait training, [phone]- Orthotic Fit/training, [phone]- Aquatic Therapy, [phone]- Ionotophoresis 4mg /ml Dexamethasone, Patient/Family education, Balance training, Stair training, Taping, Dry Needling, Joint mobilization, DME instructions, Cryotherapy, and Moist heat.  PLAN FOR NEXT SESSION: Aquatic: core and hip strengthening/ROM; balance retraining; posture retraining Land:Gait tolerance, posture and body mechanics, strengthening and stretching; HEP   Rushie Chestnut) Sherryn Pollino MPT 08/02/23 12:42 PM A [facility] [address] [place], [place], [address] Phone: [phone]   Fax:  [phone]

## 2023-08-04 ENCOUNTER — Ambulatory Visit (HOSPITAL_BASED_OUTPATIENT_CLINIC_OR_DEPARTMENT_OTHER): Payer: Medicare Other | Admitting: Physical Therapy

## 2023-08-04 ENCOUNTER — Encounter (HOSPITAL_BASED_OUTPATIENT_CLINIC_OR_DEPARTMENT_OTHER): Payer: Self-pay | Admitting: Physical Therapy

## 2023-08-04 DIAGNOSIS — R2681 Unsteadiness on feet: Secondary | ICD-10-CM | POA: Diagnosis not present

## 2023-08-04 DIAGNOSIS — M5459 Other low back pain: Secondary | ICD-10-CM

## 2023-08-04 DIAGNOSIS — R293 Abnormal posture: Secondary | ICD-10-CM | POA: Diagnosis not present

## 2023-08-04 DIAGNOSIS — M25511 Pain in right shoulder: Secondary | ICD-10-CM | POA: Diagnosis not present

## 2023-08-04 DIAGNOSIS — M5416 Radiculopathy, lumbar region: Secondary | ICD-10-CM | POA: Diagnosis not present

## 2023-08-04 NOTE — Therapy (Signed)
OUTPATIENT PHYSICAL THERAPY THORACOLUMBAR EVALUATION   Patient Name: Cassidy Garcia MRN: [id number] DOB:03/12/1946, 77 y.o., female Today's Date: 08/04/2023  END OF SESSION:  PT End of Session - 08/04/23 0945     Visit Number 4    Number of Visits 16    Date for PT Re-Evaluation 09/23/23    Authorization Type mdr A&B    Progress Note Due on Visit 10    PT Start Time [phone]    PT Stop Time 1030    PT Time Calculation (min) 44 min    Activity Tolerance Patient tolerated treatment well    Behavior During Therapy [facility] for tasks assessed/performed             Past Medical History:  Diagnosis Date   Arthritis    Asthma    Bronchitis 03/2016   Chronic kidney disease    stage 3   Colon polyps    tubular adenoma   Constipation    Depression    pt denies   Diverticulosis    GERD (gastroesophageal reflux disease)    Hemorrhoids    History of hiatal hernia    Hyperlipidemia    Hypertension    Hypothyroidism    Shortness of breath    with exertion on occasion   Sinusitis    Skin cancer    Past Surgical History:  Procedure Laterality Date   ABDOMINAL HYSTERECTOMY     BICEPT TENODESIS Right 04/26/2023   Procedure: RIGHT BICEPS TENODESIS;  Surgeon: Cammy Copa, MD;  Location: [facility] OR;  Service: Orthopedics;  Laterality: Right;   CARPOMETACARPAL (CMC) FUSION OF THUMB Bilateral    CATARACT EXTRACTION, BILATERAL     CHOLECYSTECTOMY     COLONOSCOPY  02/12/2011   HAMMER TOE SURGERY Right    LAPAROSCOPIC APPENDECTOMY N/A 08/26/2021   Procedure: APPENDECTOMY LAPAROSCOPIC WITH LYSIS OF ADHESIONS;  Surgeon: Karie Soda, MD;  Location: [facility] ORS;  Service: General;  Laterality: N/A;   NASAL SINUS SURGERY     POLYPECTOMY     REVERSE SHOULDER ARTHROPLASTY Right 04/26/2023   Procedure: RIGHT REVERSE SHOULDER ARTHROPLASTY;  Surgeon: Cammy Copa, MD;  Location: [facility] OR;  Service: Orthopedics;  Laterality: Right;   TOTAL HIP ARTHROPLASTY  12/10/2011   Procedure: TOTAL HIP  ARTHROPLASTY ANTERIOR APPROACH;  Surgeon: Kathryne Hitch, MD;  Location: [facility] ORS;  Service: Orthopedics;  Laterality: Right;  Right Total Hip Arthroplasty, Anterior Approach   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   Rotator cuff tendonitis, right 07/02/2022   Nausea 12/02/2021   Bright red rectal bleeding 09/15/2021   History of colonic polyps 07/13/2021   Gastroenteritis 09/09/2020   Anemia 03/06/2020   Hypothyroidism 02/04/2020   Depression 01/24/2020   Asthma 01/24/2020   CKD (chronic kidney disease), stage III (HCC) 01/24/2020   Acute on chronic renal failure (HCC) 01/24/2020   Constipation 12/20/2018   Pain in right hip 06/29/2017   Trochanteric bursitis, right hip 06/29/2017   History of total replacement of right hip 06/29/2017   Hemorrhoids 03/01/2017   Great toe pain, left 12/04/2016   Chronic right shoulder pain 12/04/2016   Non-toxic multinodular goiter 05/14/2013   Degenerative arthritis of hip 12/10/2011   HTN (hypertension) 07/06/2011   Hyperlipidemia 07/06/2011   Gastro-esophageal reflux disease without esophagitis 09/23/2009    PCP: Geoffry Paradise MD  REFERRING  PROVIDER: Juanda Chance, NP   REFERRING DIAG: [phone] (ICD-10-CM) - Lumbar radiculopathy   Rationale for Evaluation and Treatment: Rehabilitation  THERAPY DIAG:  Other low back pain  Unsteadiness on feet  Abnormal posture  ONSET DATE: <5 yrs  SUBJECTIVE:                                                                                                                                                                                           SUBJECTIVE STATEMENT: "Woke up hurting 4-5/10 took a tylenol 3 and sudefed no pain really now"  Initial Subjective Inj 8/24 epidural.  Have had 5-6 injections and help a little.  Can sit  for a few minutes and get up like it resets. I do not use a cane.  Can lean on a grocery cart and walk through store for up to 30 minute. I have been having an issue with balance. [facility] Pt does have access to [facility] but reports she has not used.  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 Low back and right buttock pain. 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 Yes: NPRS scale: current 3]1/10; worst: 6/10;  least 0/10 Pain location: lumbar spine are to hips and occasionally into r hip Pain description: burning, severe muscle pain Aggravating factors: standing, walking 5-10 minutes Relieving factors: meds; lidocaine patches, sitting  PRECAUTIONS: None  RED FLAGS: None   WEIGHT BEARING RESTRICTIONS: No  FALLS:  Has patient fallen in last 6 months? No  LIVING ENVIRONMENT: Lives with: lives alone   OCCUPATION:  Retired   PLOF:  Independent and Leisure: watch TV, puzzles  PATIENT GOALS: stand and walk for more than 10 minutes without cart or UE support; decrease pain.  NEXT MD VISIT: q 3-5 months  OBJECTIVE:  Note: Objective measures were completed at Evaluation unless otherwise noted.  DIAGNOSTIC FINDINGS:  6/23 Xray lumbar IMPRESSION: 1. 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 2. Multilevel neural foraminal narrowing severe on the left at L3-4 and moderate on the left at L2-3 and L4-5.  PATIENT SURVEYS:  FOTO Primary measure 42% with goal of 52% at 12th visit  COGNITION: Overall cognitive status: Within functional limits for tasks assessed     SENSATION: WFL  MUSCLE LENGTH: Hamstrings: WFL tested in sitting   POSTURE: rounded shoulders, forward head, and decreased lumbar  lordosis  PALPATION: TTP lumbar spine  paraspinals and multifidi bilaterally, glutes right sided only  LUMBAR ROM:   Full  LOWER EXTREMITY strength:     Strength Right eval Left eval  Hip flexion 23.6 35.5  Hip extension    Hip abduction 22.4 27.4  Hip adduction    Hip internal rotation    Hip external rotation    Knee flexion    Knee extension 29.6 23.2  Ankle dorsiflexion    Ankle plantarflexion    Ankle inversion    Ankle eversion     (Blank rows = not tested)  LOWER EXTREMITY ROM:    WFL  LUMBAR SPECIAL TESTS:  Slump test: Negative  FUNCTIONAL TESTS:  5 times sit to stand: from pool bench 14.99 Timed up and go (TUG): 15.22 Berg 32/56 2 mwt 363ft  GAIT: Distance walked: 500 Assistive device utilized: None Level of assistance: Complete Independence Comments: guarded posture decreased arm swing, limited hip flex  TODAY'S TREATMENT:                                                                                                                              Pt seen for aquatic therapy today.  Treatment took place in water 3.5-4.75 ft in depth at the [facility] pool. Temp of water was 91.  Pt entered/exited the pool via stairs and step to pattern with hand rail.   *walking forward/back and side stepping with UE swing *green bells shoulder abd/add; flex/ext; right shoulder IR/ER; circle cw&ccw *bilateral then unilateral left HB carry rainbow  forward and back x 2 widths ea *[place].  Requires vc and demonstration. Has some difficulty with coordination.  Good balance challeng *Solid Noodle stomp 10 slow then 10 fast R/L.  Added set in external hip rotation *TrA set using full hollow noodle pull down wide stance then staggered x5 *hip hinge: vc and demonstration for execution.  Cues for glut isometric upon standing. x8 *straddling noodle: cycling; hip add/abd; hip flex/ext   Pt requires the buoyancy and hydrostatic pressure of water for support,  and to offload joints by unweighting joint load by at least 50 % in navel deep water and by at least 75-80% in chest to neck deep water.  Viscosity of the water is needed for resistance of strengthening. Water current perturbations provides challenge to standing balance requiring increased core activation.      PATIENT EDUCATION:  Education details: Discussed eval findings, rehab rationale, aquatic program progression/POC and pools in area. Patient is in agreement  Person educated: Patient Education method: Explanation Education comprehension: verbalized understanding  HOME EXERCISE PROGRAM: TBA  ASSESSMENT:  CLINICAL IMPRESSION: Progressed core strengthening.  Pt tolerating well added exercises in hip external rotation ROM and core strengthening.  Progressed balance with stepping backward without vision field.  Pt reports some fatigue after sessions but no added pain.  She is walking to and from setting along with tolerating full sessions without limitation.  STG #1&2 met. Goals ongoing  Initial Impression Patient is a 77 y.o. f who was seen today for physical therapy evaluation and treatment for LBP. She presents with moderate pain sensitivity throughout lumbar spine through bilat hips with radicular pattern into right buttock. Main complaint is being limited primarily with amb >10 minutes unsupported.  She is not currently using a cane for amb.  Does tolerate walking through stores push a cart.  Pt demonstrates significant hip weakness bilaterally and is a high fall risk as per Sharlene Motts Balance scale. She will benefit from skilled PT both aquatic and land based in order to improve function and safety.  OBJECTIVE IMPAIRMENTS: Abnormal gait, decreased activity tolerance, decreased balance, decreased knowledge of use of DME, decreased mobility, difficulty walking, decreased strength, improper body mechanics, postural dysfunction, obesity, and pain.   ACTIVITY LIMITATIONS: carrying,  lifting, standing, transfers, and locomotion level  PARTICIPATION LIMITATIONS: meal prep, cleaning, laundry, driving, shopping, and community activity   PERSONAL FACTORS:  3+ comorbidities: OA, asthma, depression, HTN  are also affecting patient's functional outcome.   REHAB POTENTIAL: Good  CLINICAL DECISION MAKING: Evolving/moderate complexity  EVALUATION COMPLEXITY: Moderate   GOALS: Goals reviewed with patient? Yes  SHORT TERM GOALS: Target date: 08/20/23  Pt will tolerate full aquatic sessions consistently without increase in pain and with improving function to demonstrate good toleration and effectiveness of intervention.  Baseline: Goal status: Met 08/04/23  2.  Pt will tolerate walking to and from setting along with an entire aquatic session without excessive fatigue or pain To demonstrate improved toleration to activity. Baseline:  Goal status: Met 08/04/23  3.  Pt will improve on 5 X STS test to <or=    to demonstrate improving functional lower extremity strength, transitional movements, and balance Baseline: 14.99 Goal status: INITIAL  4.  Pt will complete tandem stance and SLS in 3.6 ft holding positions x >15 s without ue support Baseline:  Goal status: INITIAL  5. Pt will improve on Tug test to <or= 13s  to demonstrate improvement in lower extremity function, mobility and decreased fall risk. Baseline: 15.22 Goal status: INITIAL      LONG TERM GOALS: Target date: 09/23/23  Pt to meet stated Foto Goal Baseline:  Goal status: INITIAL  2.  Pt to report improved ability to step up into SUV safely. Baseline:  Goal status: INITIAL  3.  Pt will be indep with final HEP's (land and aquatic as appropriate) for continued management of condition Baseline:  Goal status: INITIAL  4.  Pt will improve strength in bilateral hips by at least 10lbs to demonstrate improved overall physical function  Baseline:  Goal status: INITIAL  5.  Pt will improve on Berg balance  test to >/= 50/56 to demonstrate a decrease in fall risk. Baseline: 32/56 Goal status: INITIAL  6.  Pt will amb community distances with or without ad without limitation to pain Baseline:  Goal status: INITIAL  PLAN:  PT FREQUENCY: 1-2x/week  PT DURATION: 8 weeks  PLANNED INTERVENTIONS: 97164- PT Re-evaluation, 97110-Therapeutic exercises, 97530- Therapeutic activity, 97112- Neuromuscular re-education, 97535- Self Care, [address]- Manual therapy, [phone]- Gait training, [phone]- Orthotic Fit/training, [phone]- Aquatic Therapy, [phone]- Ionotophoresis 4mg /ml Dexamethasone, Patient/Family education, Balance training, Stair training, Taping, Dry Needling, Joint mobilization, DME instructions, Cryotherapy, and Moist heat.  PLAN FOR NEXT SESSION: Aquatic: core and hip strengthening/ROM; balance retraining; posture retraining Land:Gait tolerance, posture and body mechanics, strengthening and stretching; HEP   Corrie Dandy Tomma Lightning) Bilal Manzer MPT 08/04/23 9:47 AM  [facility] [address]  [place], [place], [address] Phone: [phone]   Fax:  [phone]

## 2023-08-11 ENCOUNTER — Ambulatory Visit (HOSPITAL_BASED_OUTPATIENT_CLINIC_OR_DEPARTMENT_OTHER): Payer: Medicare Other | Admitting: Physical Therapy

## 2023-08-11 ENCOUNTER — Encounter (HOSPITAL_BASED_OUTPATIENT_CLINIC_OR_DEPARTMENT_OTHER): Payer: Self-pay | Admitting: Physical Therapy

## 2023-08-11 DIAGNOSIS — R2681 Unsteadiness on feet: Secondary | ICD-10-CM | POA: Diagnosis not present

## 2023-08-11 DIAGNOSIS — R293 Abnormal posture: Secondary | ICD-10-CM

## 2023-08-11 DIAGNOSIS — M25511 Pain in right shoulder: Secondary | ICD-10-CM | POA: Diagnosis not present

## 2023-08-11 DIAGNOSIS — M5459 Other low back pain: Secondary | ICD-10-CM | POA: Diagnosis not present

## 2023-08-11 DIAGNOSIS — M5416 Radiculopathy, lumbar region: Secondary | ICD-10-CM | POA: Diagnosis not present

## 2023-08-11 NOTE — Therapy (Signed)
OUTPATIENT PHYSICAL THERAPY THORACOLUMBAR TREATMENT   Patient Name: Cassidy Garcia MRN: [id number] DOB:[birth year], 77 y.o., female Today's Date: 08/11/2023  END OF SESSION:  PT End of Session - 08/11/23 1617     Visit Number 5    Number of Visits 16    Date for PT Re-Evaluation 09/23/23    Authorization Type mdr A&B    Progress Note Due on Visit 10    PT Start Time 1612    PT Stop Time 1652    PT Time Calculation (min) 40 min    Behavior During Therapy [facility] for tasks assessed/performed             Past Medical History:  Diagnosis Date   Arthritis    Asthma    Bronchitis 03/2016   Chronic kidney disease    stage 3   Colon polyps    tubular adenoma   Constipation    Depression    pt denies   Diverticulosis    GERD (gastroesophageal reflux disease)    Hemorrhoids    History of hiatal hernia    Hyperlipidemia    Hypertension    Hypothyroidism    Shortness of breath    with exertion on occasion   Sinusitis    Skin cancer    Past Surgical History:  Procedure Laterality Date   ABDOMINAL HYSTERECTOMY     BICEPT TENODESIS Right 04/26/2023   Procedure: RIGHT BICEPS TENODESIS;  Surgeon: Cammy Copa, MD;  Location: [facility] OR;  Service: Orthopedics;  Laterality: Right;   CARPOMETACARPAL (CMC) FUSION OF THUMB Bilateral    CATARACT EXTRACTION, BILATERAL     CHOLECYSTECTOMY     COLONOSCOPY  02/12/2011   HAMMER TOE SURGERY Right    LAPAROSCOPIC APPENDECTOMY N/A 08/26/2021   Procedure: APPENDECTOMY LAPAROSCOPIC WITH LYSIS OF ADHESIONS;  Surgeon: Karie Soda, MD;  Location: [facility] ORS;  Service: General;  Laterality: N/A;   NASAL SINUS SURGERY     POLYPECTOMY     REVERSE SHOULDER ARTHROPLASTY Right 04/26/2023   Procedure: RIGHT REVERSE SHOULDER ARTHROPLASTY;  Surgeon: Cammy Copa, MD;  Location: [facility] OR;  Service: Orthopedics;  Laterality: Right;   TOTAL HIP ARTHROPLASTY  12/10/2011   Procedure: TOTAL HIP ARTHROPLASTY ANTERIOR APPROACH;  Surgeon: Kathryne Hitch, MD;  Location: [facility];  Service: Orthopedics;  Laterality: Right;  Right Total Hip Arthroplasty, Anterior Approach   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   Rotator cuff tendonitis, right 07/02/2022   Nausea 12/02/2021   Bright red rectal bleeding 09/15/2021   History of colonic polyps 07/13/2021   Gastroenteritis 09/09/2020   Anemia 03/06/2020   Hypothyroidism 02/04/2020   Depression 01/24/2020   Asthma 01/24/2020   CKD (chronic kidney disease), stage III (HCC) 01/24/2020   Acute on chronic renal failure (HCC) 01/24/2020   Constipation 12/20/2018   Pain in right hip 06/29/2017   Trochanteric bursitis, right hip 06/29/2017   History of total replacement of right hip 06/29/2017   Hemorrhoids 03/01/2017   Great toe pain, left 12/04/2016   Chronic right shoulder pain 12/04/2016   Non-toxic multinodular goiter 05/14/2013   Degenerative arthritis of hip 12/10/2011   HTN (hypertension) 07/06/2011   Hyperlipidemia 07/06/2011   Gastro-esophageal reflux disease without esophagitis 09/23/2009    PCP: Geoffry Paradise MD  REFERRING PROVIDER: Juanda Chance, NP   REFERRING DIAG:  M54.16 (ICD-10-CM) - Lumbar radiculopathy   Rationale for Evaluation and Treatment: Rehabilitation  THERAPY DIAG:  Other low back pain  Unsteadiness on feet  Abnormal posture  Acute pain of right shoulder  ONSET DATE: <5 yrs  SUBJECTIVE:                                                                                                                                                                                           SUBJECTIVE STATEMENT: Pt reports some increase in pain in Rt hip/shoulder; took pain medicine today with some relief.   POOL ACCESS: Pt does have access to [facility] but reports she has not used. May explore other  pools.  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 Low back and right buttock pain. 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 Yes: NPRS scale: current 4/10 Pain location: R shoulder, R hip, across lower back Pain description:dull  Aggravating factors: standing, walking 5-10 minutes Relieving factors: meds; lidocaine patches, sitting  PRECAUTIONS: None  RED FLAGS: None   WEIGHT BEARING RESTRICTIONS: No  FALLS:  Has patient fallen in last 6 months? No  LIVING ENVIRONMENT: Lives with: lives alone   OCCUPATION:  Retired   PLOF:  Independent and Leisure: watch TV, puzzles  PATIENT GOALS: stand and walk for more than 10 minutes without cart or UE support; decrease pain.  NEXT MD VISIT: q 3-5 months  OBJECTIVE:  Note: Objective measures were completed at Evaluation unless otherwise noted.  DIAGNOSTIC FINDINGS:  6/23 Xray lumbar IMPRESSION: 1. 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 2. Multilevel neural foraminal narrowing severe on the left at L3-4 and moderate on the left at L2-3 and L4-5.  PATIENT SURVEYS:  FOTO Primary measure 42% with goal of 52% at 12th visit  COGNITION: Overall cognitive status: Within functional limits for tasks assessed     SENSATION: WFL  MUSCLE LENGTH: Hamstrings: WFL tested in sitting   POSTURE: rounded shoulders, forward head, and decreased lumbar lordosis  PALPATION: TTP lumbar spine paraspinals and multifidi bilaterally, glutes right sided only  LUMBAR ROM:   Full  LOWER EXTREMITY strength:     Strength Right eval Left eval  Hip flexion 23.6 35.5  Hip extension    Hip abduction 22.4 27.4  Hip adduction    Hip internal rotation    Hip external rotation    Knee flexion    Knee extension  29.6 23.2  Ankle dorsiflexion  Ankle plantarflexion    Ankle inversion    Ankle eversion     (Blank rows = not tested)  LOWER EXTREMITY ROM:    WFL  LUMBAR SPECIAL TESTS:  Slump test: Negative  FUNCTIONAL TESTS:  5 times sit to stand: from pool bench 14.99 Timed up and go (TUG): 15.22 Berg 32/56 2 mwt 341ft  GAIT: Distance walked: 500 Assistive device utilized: None Level of assistance: Complete Independence Comments: guarded posture decreased arm swing, limited hip flex  TODAY'S TREATMENT:                                                                                                                              Pt seen for aquatic therapy today.  Treatment took place in water 3.5-4.75 ft in depth at the [facility] pool. Temp of water was 91.  Pt entered/exited the pool via stairs and step to pattern with hand rail.  * 4+ feet, unsupported: cross country ski *green bells shoulder abd/add with side stepping; walking forward backward with reciprocal arm swing * UE on yellow hand floats:  LE swings into hip abdct/ addct (crossing midline) x 12, hip flex/ext x 12 *[place] with step back x 10 each,  -Good balance challenge * single leg superman 2 x 5 with UE on yellow hand floats *Solid Noodle stomp into hip/knee flexion x 10 slow then 10 fast R/L.   *TrA set using solid noodle pull down to thighs in wide stance *straddling noodle: cycling; hip add/abd; * SLS with hands out of water x 15sec x 2 reps   Pt requires the buoyancy and hydrostatic pressure of water for support, and to offload joints by unweighting joint load by at least 50 % in navel deep water and by at least 75-80% in chest to neck deep water.  Viscosity of the water is needed for resistance of strengthening. Water current perturbations provides challenge to standing balance requiring increased core activation.  PATIENT EDUCATION:  Education details: aquatic therapy exercise progressions/  modifications  Person educated: Patient Education method: Explanation Education comprehension: verbalized understanding  HOME EXERCISE PROGRAM: TBA  ASSESSMENT:  CLINICAL IMPRESSION: Pt reported overall reduction in pain when exercising in the water. Her R shoulder fatigued when using it for support with SLS (noodle stomp).  SLS unsupported in 4 ft was a good challenge. Will continue to work on [place] activities in water to improve balance. Pt to check out local pools to find right fit in the near future.  Will begin creating aquatic HEP in next few visits.   Therapist to check STGs next visit as time allows.      Initial Impression Patient is a 77 y.o. f who was seen today for physical therapy evaluation and treatment for LBP. She presents with moderate pain sensitivity throughout lumbar spine through bilat hips with radicular pattern into right buttock. Main complaint is being limited primarily with amb >10 minutes unsupported.  She is  not currently using a cane for amb.  Does tolerate walking through stores push a cart.  Pt demonstrates significant hip weakness bilaterally and is a high fall risk as per Sharlene Motts Balance scale. She will benefit from skilled PT both aquatic and land based in order to improve function and safety.  OBJECTIVE IMPAIRMENTS: Abnormal gait, decreased activity tolerance, decreased balance, decreased knowledge of use of DME, decreased mobility, difficulty walking, decreased strength, improper body mechanics, postural dysfunction, obesity, and pain.   ACTIVITY LIMITATIONS: carrying, lifting, standing, transfers, and locomotion level  PARTICIPATION LIMITATIONS: meal prep, cleaning, laundry, driving, shopping, and community activity   PERSONAL FACTORS:  3+ comorbidities: OA, asthma, depression, HTN  are also affecting patient's functional outcome.   REHAB POTENTIAL: Good  CLINICAL DECISION MAKING: Evolving/moderate complexity  EVALUATION COMPLEXITY:  Moderate   GOALS: Goals reviewed with patient? Yes  SHORT TERM GOALS: Target date: 08/20/23  Pt will tolerate full aquatic sessions consistently without increase in pain and with improving function to demonstrate good toleration and effectiveness of intervention.  Baseline: Goal status: Met 08/04/23  2.  Pt will tolerate walking to and from setting along with an entire aquatic session without excessive fatigue or pain To demonstrate improved toleration to activity. Baseline:  Goal status: Met 08/04/23  3.  Pt will improve on 5 X STS test to <or=    to demonstrate improving functional lower extremity strength, transitional movements, and balance Baseline: 14.99 Goal status: INITIAL  4.  Pt will complete tandem stance and SLS in 3.6 ft holding positions x >15 s without ue support Baseline:  Goal status: INITIAL  5. Pt will improve on Tug test to <or= 13s  to demonstrate improvement in lower extremity function, mobility and decreased fall risk. Baseline: 15.22 Goal status: INITIAL      LONG TERM GOALS: Target date: 09/23/23  Pt to meet stated Foto Goal Baseline:  Goal status: INITIAL  2.  Pt to report improved ability to step up into SUV safely. Baseline:  Goal status: INITIAL  3.  Pt will be indep with final HEP's (land and aquatic as appropriate) for continued management of condition Baseline:  Goal status: INITIAL  4.  Pt will improve strength in bilateral hips by at least 10lbs to demonstrate improved overall physical function  Baseline:  Goal status: INITIAL  5.  Pt will improve on Berg balance test to >/= 50/56 to demonstrate a decrease in fall risk. Baseline: 32/56 Goal status: INITIAL  6.  Pt will amb community distances with or without ad without limitation to pain Baseline:  Goal status: INITIAL  PLAN:  PT FREQUENCY: 1-2x/week  PT DURATION: 8 weeks  PLANNED INTERVENTIONS: 97164- PT Re-evaluation, 97110-Therapeutic exercises, 97530- Therapeutic  activity, 97112- Neuromuscular re-education, 97535- Self Care, [id number]- Manual therapy, [phone]- Gait training, [phone]- Orthotic Fit/training, [phone]- Aquatic Therapy, [phone]- Ionotophoresis 4mg /ml Dexamethasone, Patient/Family education, Balance training, Stair training, Taping, Dry Needling, Joint mobilization, DME instructions, Cryotherapy, and Moist heat.  PLAN FOR NEXT SESSION: Aquatic: core and hip strengthening/ROM; balance retraining; posture retraining Land:Gait tolerance, posture and body mechanics, strengthening and stretching; HEP  Mayer Camel, PTA 08/11/23 6:03 PM [facility] [address] [place], [place], [address] Phone: [phone]   Fax:  [phone]

## 2023-08-18 ENCOUNTER — Ambulatory Visit (HOSPITAL_BASED_OUTPATIENT_CLINIC_OR_DEPARTMENT_OTHER): Payer: Medicare Other | Attending: Physical Medicine and Rehabilitation | Admitting: Physical Therapy

## 2023-08-18 ENCOUNTER — Encounter (HOSPITAL_BASED_OUTPATIENT_CLINIC_OR_DEPARTMENT_OTHER): Payer: Self-pay | Admitting: Physical Therapy

## 2023-08-18 DIAGNOSIS — M5459 Other low back pain: Secondary | ICD-10-CM | POA: Insufficient documentation

## 2023-08-18 DIAGNOSIS — N1831 Chronic kidney disease, stage 3a: Secondary | ICD-10-CM | POA: Diagnosis not present

## 2023-08-18 DIAGNOSIS — I129 Hypertensive chronic kidney disease with stage 1 through stage 4 chronic kidney disease, or unspecified chronic kidney disease: Secondary | ICD-10-CM | POA: Diagnosis not present

## 2023-08-18 DIAGNOSIS — R2681 Unsteadiness on feet: Secondary | ICD-10-CM | POA: Insufficient documentation

## 2023-08-18 DIAGNOSIS — R293 Abnormal posture: Secondary | ICD-10-CM | POA: Diagnosis not present

## 2023-08-18 DIAGNOSIS — D649 Anemia, unspecified: Secondary | ICD-10-CM | POA: Diagnosis not present

## 2023-08-18 DIAGNOSIS — Z1231 Encounter for screening mammogram for malignant neoplasm of breast: Secondary | ICD-10-CM | POA: Diagnosis not present

## 2023-08-18 DIAGNOSIS — E039 Hypothyroidism, unspecified: Secondary | ICD-10-CM | POA: Diagnosis not present

## 2023-08-18 DIAGNOSIS — E785 Hyperlipidemia, unspecified: Secondary | ICD-10-CM | POA: Diagnosis not present

## 2023-08-18 DIAGNOSIS — Z1212 Encounter for screening for malignant neoplasm of rectum: Secondary | ICD-10-CM | POA: Diagnosis not present

## 2023-08-18 NOTE — Therapy (Signed)
 OUTPATIENT PHYSICAL THERAPY THORACOLUMBAR TREATMENT   Patient Name: Cassidy Garcia MRN: [id number] DOB:1946-02-07, 78 y.o., female Today's Date: 08/18/2023  END OF SESSION:  PT End of Session - 08/18/23 1530     Visit Number 6    Number of Visits 16    Date for PT Re-Evaluation 09/23/23    Authorization Type mdr A&B    Progress Note Due on Visit 10    PT Start Time 1531    PT Stop Time 1615    PT Time Calculation (min) 44 min    Activity Tolerance Patient tolerated treatment well    Behavior During Therapy [facility] for tasks assessed/performed             Past Medical History:  Diagnosis Date   Arthritis    Asthma    Bronchitis 03/2016   Chronic kidney disease    stage 3   Colon polyps    tubular adenoma   Constipation    Depression    pt denies   Diverticulosis    GERD (gastroesophageal reflux disease)    Hemorrhoids    History of hiatal hernia    Hyperlipidemia    Hypertension    Hypothyroidism    Shortness of breath    with exertion on occasion   Sinusitis    Skin cancer    Past Surgical History:  Procedure Laterality Date   ABDOMINAL HYSTERECTOMY     BICEPT TENODESIS Right 04/26/2023   Procedure: RIGHT BICEPS TENODESIS;  Surgeon: Addie Cordella Hamilton, MD;  Location: [facility] OR;  Service: Orthopedics;  Laterality: Right;   CARPOMETACARPAL (CMC) FUSION OF THUMB Bilateral    CATARACT EXTRACTION, BILATERAL     CHOLECYSTECTOMY     COLONOSCOPY  02/12/2011   HAMMER TOE SURGERY Right    LAPAROSCOPIC APPENDECTOMY N/A 08/26/2021   Procedure: APPENDECTOMY LAPAROSCOPIC WITH LYSIS OF ADHESIONS;  Surgeon: Sheldon Standing, MD;  Location: [facility] ORS;  Service: General;  Laterality: N/A;   NASAL SINUS SURGERY     POLYPECTOMY     REVERSE SHOULDER ARTHROPLASTY Right 04/26/2023   Procedure: RIGHT REVERSE SHOULDER ARTHROPLASTY;  Surgeon: Addie Cordella Hamilton, MD;  Location: [facility];  Service: Orthopedics;  Laterality: Right;   TOTAL HIP ARTHROPLASTY  12/10/2011   Procedure: TOTAL HIP  ARTHROPLASTY ANTERIOR APPROACH;  Surgeon: Lonni CINDERELLA Poli, MD;  Location: [facility];  Service: Orthopedics;  Laterality: Right;  Right Total Hip Arthroplasty, Anterior Approach   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   Rotator cuff tendonitis, right 07/02/2022   Nausea 12/02/2021   Bright red rectal bleeding 09/15/2021   History of colonic polyps 07/13/2021   Gastroenteritis 09/09/2020   Anemia 03/06/2020   Hypothyroidism 02/04/2020   Depression 01/24/2020   Asthma 01/24/2020   CKD (chronic kidney disease), stage III (HCC) 01/24/2020   Acute on chronic renal failure (HCC) 01/24/2020   Constipation 12/20/2018   Pain in right hip 06/29/2017   Trochanteric bursitis, right hip 06/29/2017   History of total replacement of right hip 06/29/2017   Hemorrhoids 03/01/2017   Great toe pain, left 12/04/2016   Chronic right shoulder pain 12/04/2016   Non-toxic multinodular goiter 05/14/2013   Degenerative arthritis of hip 12/10/2011   HTN (hypertension) 07/06/2011   Hyperlipidemia 07/06/2011   Gastro-esophageal reflux disease without esophagitis 09/23/2009    PCP: Charlie Love MD  REFERRING  PROVIDER: Trudy Duwaine BRAVO, NP   REFERRING DIAG: [phone] (ICD-10-CM) - Lumbar radiculopathy   Rationale for Evaluation and Treatment: Rehabilitation  THERAPY DIAG:  Other low back pain  Unsteadiness on feet  Abnormal posture  ONSET DATE: <5 yrs  SUBJECTIVE:                                                                                                                                                                                           SUBJECTIVE STATEMENT: Had to walk a lot today.  My back feels tired.  I can sit down for 5-10  inutes and then can go for another 5-10 minutes. My shoulder is fine until I do something with  it  POOL ACCESS: Pt does have access to [facility] but reports she has not used. May explore other pools.  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 Low back and right buttock pain. 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 Yes: NPRS scale: current 0/10 no pain just high fatigue Pain location: R shoulder, R hip, across lower back Pain description:dull  Aggravating factors: standing, walking 5-10 minutes Relieving factors: meds; lidocaine  patches, sitting  PRECAUTIONS: None  RED FLAGS: None   WEIGHT BEARING RESTRICTIONS: No  FALLS:  Has patient fallen in last 6 months? No  LIVING ENVIRONMENT: Lives with: lives alone   OCCUPATION:  Retired   PLOF:  Independent and Leisure: watch TV, puzzles  PATIENT GOALS: stand and walk for more than 10 minutes without cart or UE support; decrease pain.  NEXT MD VISIT: q 3-5 months  OBJECTIVE:  Note: Objective measures were completed at Evaluation unless otherwise noted.  DIAGNOSTIC FINDINGS:  6/23 Xray lumbar IMPRESSION: 1. 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 2. Multilevel neural foraminal narrowing severe on the left at L3-4 and moderate on the left at L2-3 and L4-5.  PATIENT SURVEYS:  FOTO Primary measure 42% with goal of 52% at 12th visit 08/18/23: 53%  COGNITION: Overall cognitive status: Within functional limits for tasks assessed     SENSATION: WFL  MUSCLE LENGTH: Hamstrings: WFL tested in sitting   POSTURE: rounded shoulders, forward head, and decreased lumbar lordosis  PALPATION: TTP lumbar spine paraspinals and multifidi bilaterally, glutes right sided only  LUMBAR ROM:   Full  LOWER EXTREMITY strength:     Strength Right eval Left eval  Hip flexion 23.6 35.5  Hip extension     Hip abduction 22.4 27.4  Hip adduction    Hip internal rotation    Hip external rotation    Knee flexion    Knee extension 29.6 23.2  Ankle dorsiflexion    Ankle plantarflexion    Ankle inversion    Ankle eversion     (Blank rows = not tested)  LOWER EXTREMITY ROM:    WFL  LUMBAR SPECIAL TESTS:  Slump test: Negative  FUNCTIONAL TESTS:  5 times sit to stand: from pool bench 14.99 Timed up and go (TUG): 15.22 Berg 32/56 2 mwt 359ft  GAIT: Distance walked: 500 Assistive device utilized: None Level of assistance: Complete Independence Comments: guarded posture decreased arm swing, limited hip flex  TODAY'S TREATMENT:                                                                                                                              Pt seen for aquatic therapy today.  Treatment took place in water  3.5-4.75 ft in depth at the [facility] pool. Temp of water  was 91.  Pt entered/exited the pool via stairs and step to pattern with hand rail.   *walking *TrA set using solid noodle pull down to thighs in wide stance then staggered x 10 *Bow & Arrow with step back x 10 each,  -Good balance challenge.  Completed with improvement * UE on yellow hand floats:  LE swings into hip abdct/ addct (crossing midline) x 12, hip flex/ext x 12 * single leg superman 2 x 5 with UE on yellow hand floats * 4+ feet, straddling noodle: cross country ski; reverse jumping jack x 10-15 *Solid Noodle stomp into hip/knee flexion x 10 slow then 10 fast R/L.   *straddling noodle: cycling breast stroke arms x 4 widths * SLS with hands out of water  x18 sec 3.6 ft left le; rle x 15 *rle step taps standing on bottom of pool to 2nd step x 5; standing on bottom step tapping 3rd step x 10 RLE only  *Step ups bottom step leading right than left x 10. Cues for unsupported balance  Pt requires the buoyancy and hydrostatic pressure of water  for support, and to offload joints by unweighting joint  load by at least 50 % in navel deep water  and by at least 75-80% in chest to neck deep water .  Viscosity of the water  is needed for resistance of strengthening. Water  current perturbations provides challenge to standing balance requiring increased core activation.  PATIENT EDUCATION:  Education details: aquatic therapy exercise progressions/ modifications  Person educated: Patient Education method: Explanation Education comprehension: verbalized understanding  HOME EXERCISE PROGRAM: TBA  ASSESSMENT:  CLINICAL IMPRESSION: Pt with reports of back fatigue after busy day with appointments. She demonstrates improving balance submerged completing Bow & Arrow with  better control fewer missteps and meets ST balance goal # 4.  Foto completed.  She has also met Foto LTG #1 demonstrating improve functional ability at home.  She is progressing well. Plan to test  5 x STS and TUG next session.       Initial Impression Patient is a 78 y.o. f who was seen today for physical therapy evaluation and treatment for LBP. She presents with moderate pain sensitivity throughout lumbar spine through bilat hips with radicular pattern into right buttock. Main complaint is being limited primarily with amb >10 minutes unsupported.  She is not currently using a cane for amb.  Does tolerate walking through stores push a cart.  Pt demonstrates significant hip weakness bilaterally and is a high fall risk as per Lars Balance scale. She will benefit from skilled PT both aquatic and land based in order to improve function and safety.  OBJECTIVE IMPAIRMENTS: Abnormal gait, decreased activity tolerance, decreased balance, decreased knowledge of use of DME, decreased mobility, difficulty walking, decreased strength, improper body mechanics, postural dysfunction, obesity, and pain.   ACTIVITY LIMITATIONS: carrying, lifting, standing, transfers, and locomotion level  PARTICIPATION LIMITATIONS: meal prep, cleaning, laundry,  driving, shopping, and community activity   PERSONAL FACTORS:  3+ comorbidities: OA, asthma, depression, HTN  are also affecting patient's functional outcome.   REHAB POTENTIAL: Good  CLINICAL DECISION MAKING: Evolving/moderate complexity  EVALUATION COMPLEXITY: Moderate   GOALS: Goals reviewed with patient? Yes  SHORT TERM GOALS: Target date: 08/20/23  Pt will tolerate full aquatic sessions consistently without increase in pain and with improving function to demonstrate good toleration and effectiveness of intervention.  Baseline: Goal status: Met 08/04/23  2.  Pt will tolerate walking to and from setting along with an entire aquatic session without excessive fatigue or pain To demonstrate improved toleration to activity. Baseline:  Goal status: Met 08/04/23  3.  Pt will improve on 5 X STS test to <or=  12s  to demonstrate improving functional lower extremity strength, transitional movements, and balance Baseline: 14.99 Goal status: INITIAL  4.  Pt will complete tandem stance and SLS in 3.6 ft holding positions x >15 s without ue support Baseline:  Goal status:Met 08/18/23  5. Pt will improve on Tug test to <or= 13s  to demonstrate improvement in lower extremity function, mobility and decreased fall risk. Baseline: 15.22 Goal status: INITIAL      LONG TERM GOALS: Target date: 09/23/23  Pt to meet stated Foto Goal Baseline: 53% 08/18/23 Goal status: Met 08/18/23  2.  Pt to report improved ability to step up into SUV safely. Baseline:  Goal status: INITIAL  3.  Pt will be indep with final HEP's (land and aquatic as appropriate) for continued management of condition Baseline:  Goal status: INITIAL  4.  Pt will improve strength in bilateral hips by at least 10lbs to demonstrate improved overall physical function  Baseline:  Goal status: INITIAL  5.  Pt will improve on Berg balance test to >/= 50/56 to demonstrate a decrease in fall risk. Baseline: 32/56 Goal status:  INITIAL  6.  Pt will amb community distances with or without ad without limitation to pain Baseline:  Goal status: INITIAL  PLAN:  PT FREQUENCY: 1-2x/week  PT DURATION: 8 weeks  PLANNED INTERVENTIONS: 97164- PT Re-evaluation, 97110-Therapeutic exercises, 97530- Therapeutic activity, 97112- Neuromuscular re-education, 97535- Self Care, [id number]- Manual therapy, [phone]- Gait training, [phone]- Orthotic Fit/training, [phone]- Aquatic Therapy, [phone]- Ionotophoresis 4mg /ml Dexamethasone , Patient/Family education, Balance training, Stair training, Taping, Dry Needling, Joint mobilization, DME instructions, Cryotherapy, and Moist heat.  PLAN FOR NEXT SESSION: Aquatic: core and hip strengthening/ROM; balance retraining; posture retraining Land:Gait tolerance, posture and body mechanics, strengthening and stretching; HEP  Ronal Kem)  Kaylob Wallen MPT 08/18/23 3:31 PM [facility] [address] [place], [place], [address] Phone: [phone]   Fax:  [phone]

## 2023-08-23 ENCOUNTER — Ambulatory Visit (HOSPITAL_BASED_OUTPATIENT_CLINIC_OR_DEPARTMENT_OTHER): Payer: Medicare Other | Admitting: Physical Therapy

## 2023-08-23 ENCOUNTER — Encounter (HOSPITAL_BASED_OUTPATIENT_CLINIC_OR_DEPARTMENT_OTHER): Payer: Self-pay | Admitting: Physical Therapy

## 2023-08-23 DIAGNOSIS — R293 Abnormal posture: Secondary | ICD-10-CM

## 2023-08-23 DIAGNOSIS — R2681 Unsteadiness on feet: Secondary | ICD-10-CM | POA: Diagnosis not present

## 2023-08-23 DIAGNOSIS — M5459 Other low back pain: Secondary | ICD-10-CM | POA: Diagnosis not present

## 2023-08-23 NOTE — Therapy (Signed)
 OUTPATIENT PHYSICAL THERAPY THORACOLUMBAR TREATMENT   Patient Name: Cassidy Garcia MRN: [id number] DOB:1946-03-11, 78 y.o., female Today's Date: 08/23/2023  END OF SESSION:  PT End of Session - 08/23/23 1448     Visit Number 7    Number of Visits 16    Date for PT Re-Evaluation 09/23/23    Authorization Type mdr A&B    Progress Note Due on Visit 10    PT Start Time 1447    PT Stop Time 1530    PT Time Calculation (min) 43 min    Activity Tolerance Patient tolerated treatment well    Behavior During Therapy [facility] for tasks assessed/performed             Past Medical History:  Diagnosis Date   Arthritis    Asthma    Bronchitis 03/2016   Chronic kidney disease    stage 3   Colon polyps    tubular adenoma   Constipation    Depression    pt denies   Diverticulosis    GERD (gastroesophageal reflux disease)    Hemorrhoids    History of hiatal hernia    Hyperlipidemia    Hypertension    Hypothyroidism    Shortness of breath    with exertion on occasion   Sinusitis    Skin cancer    Past Surgical History:  Procedure Laterality Date   ABDOMINAL HYSTERECTOMY     BICEPT TENODESIS Right 04/26/2023   Procedure: RIGHT BICEPS TENODESIS;  Surgeon: Addie Cordella Hamilton, MD;  Location: [facility] OR;  Service: Orthopedics;  Laterality: Right;   CARPOMETACARPAL (CMC) FUSION OF THUMB Bilateral    CATARACT EXTRACTION, BILATERAL     CHOLECYSTECTOMY     COLONOSCOPY  02/12/2011   HAMMER TOE SURGERY Right    LAPAROSCOPIC APPENDECTOMY N/A 08/26/2021   Procedure: APPENDECTOMY LAPAROSCOPIC WITH LYSIS OF ADHESIONS;  Surgeon: Sheldon Standing, MD;  Location: [facility] ORS;  Service: General;  Laterality: N/A;   NASAL SINUS SURGERY     POLYPECTOMY     REVERSE SHOULDER ARTHROPLASTY Right 04/26/2023   Procedure: RIGHT REVERSE SHOULDER ARTHROPLASTY;  Surgeon: Addie Cordella Hamilton, MD;  Location: [facility] OR;  Service: Orthopedics;  Laterality: Right;   TOTAL HIP ARTHROPLASTY  12/10/2011   Procedure: TOTAL HIP  ARTHROPLASTY ANTERIOR APPROACH;  Surgeon: Lonni CINDERELLA Poli, MD;  Location: [facility] ORS;  Service: Orthopedics;  Laterality: Right;  Right Total Hip Arthroplasty, Anterior Approach   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   Rotator cuff tendonitis, right 07/02/2022   Nausea 12/02/2021   Bright red rectal bleeding 09/15/2021   History of colonic polyps 07/13/2021   Gastroenteritis 09/09/2020   Anemia 03/06/2020   Hypothyroidism 02/04/2020   Depression 01/24/2020   Asthma 01/24/2020   CKD (chronic kidney disease), stage III (HCC) 01/24/2020   Acute on chronic renal failure (HCC) 01/24/2020   Constipation 12/20/2018   Pain in right hip 06/29/2017   Trochanteric bursitis, right hip 06/29/2017   History of total replacement of right hip 06/29/2017   Hemorrhoids 03/01/2017   Great toe pain, left 12/04/2016   Chronic right shoulder pain 12/04/2016   Non-toxic multinodular goiter 05/14/2013   Degenerative arthritis of hip 12/10/2011   HTN (hypertension) 07/06/2011   Hyperlipidemia 07/06/2011   Gastro-esophageal reflux disease without esophagitis 09/23/2009    PCP: Charlie Love MD  REFERRING  PROVIDER: Trudy Duwaine BRAVO, NP   REFERRING DIAG: [phone] (ICD-10-CM) - Lumbar radiculopathy   Rationale for Evaluation and Treatment: Rehabilitation  THERAPY DIAG:  Other low back pain  Unsteadiness on feet  Abnormal posture  ONSET DATE: <5 yrs  SUBJECTIVE:                                                                                                                                                                                           SUBJECTIVE STATEMENT: May stop ad check out [facility] water  aerobic classes.  No pain just soarness  POOL ACCESS: Pt does have access to [facility] but reports she has not used. May  explore other pools.  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 Low back and right buttock pain. 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 Yes: NPRS scale: current 0/10 no pain just high fatigue Pain location: R shoulder, R hip, across lower back Pain description:dull  Aggravating factors: standing, walking 5-10 minutes Relieving factors: meds; lidocaine  patches, sitting  PRECAUTIONS: None  RED FLAGS: None   WEIGHT BEARING RESTRICTIONS: No  FALLS:  Has patient fallen in last 6 months? No  LIVING ENVIRONMENT: Lives with: lives alone   OCCUPATION:  Retired   PLOF:  Independent and Leisure: watch TV, puzzles  PATIENT GOALS: stand and walk for more than 10 minutes without cart or UE support; decrease pain.  NEXT MD VISIT: q 3-5 months  OBJECTIVE:  Note: Objective measures were completed at Evaluation unless otherwise noted.  DIAGNOSTIC FINDINGS:  6/23 Xray lumbar IMPRESSION: 1. 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 2. Multilevel neural foraminal narrowing severe on the left at L3-4 and moderate on the left at L2-3 and L4-5.  PATIENT SURVEYS:  FOTO Primary measure 42% with goal of 52% at 12th visit 08/18/23: 53%  COGNITION: Overall cognitive status: Within functional limits for tasks assessed     SENSATION: WFL  MUSCLE LENGTH: Hamstrings: WFL tested in sitting   POSTURE: rounded shoulders, forward head, and decreased lumbar lordosis  PALPATION: TTP lumbar spine paraspinals and multifidi bilaterally, glutes right sided only  LUMBAR ROM:   Full  LOWER EXTREMITY strength:     Strength Right eval Left eval  Hip flexion 23.6 35.5  Hip extension    Hip abduction 22.4 27.4  Hip adduction    Hip internal rotation    Hip  external rotation    Knee flexion    Knee  extension 29.6 23.2  Ankle dorsiflexion    Ankle plantarflexion    Ankle inversion    Ankle eversion     (Blank rows = not tested)  LOWER EXTREMITY ROM:    WFL  LUMBAR SPECIAL TESTS:  Slump test: Negative  FUNCTIONAL TESTS:  5 times sit to stand: from pool bench 14.99 Timed up and go (TUG): 15.22 Berg 32/56 2 mwt 361ft  GAIT: Distance walked: 500 Assistive device utilized: None Level of assistance: Complete Independence Comments: guarded posture decreased arm swing, limited hip flex  TODAY'S TREATMENT:                                                                                                                              Pt seen for aquatic therapy today.  Treatment took place in water  3.5-4.75 ft in depth at the [facility] pool. Temp of water  was 91.  Pt entered/exited the pool via stairs and step to pattern with hand rail.   *walking *TrA set using solid noodle pull down to thighs in wide stance then staggered x 10 *[place] with step back x 10 each, (with ease) *Solid Noodle stomp into hip/knee flexion x 10 slow then 10 fast R/L in both hip neutral then external hip rotation Step ups bottom step leading right than left x 10. Cues for unsupported balance *Stair  taps standing on bottom of pool to 2nd step x 10 * 4+ feet, straddling noodle: cross country ski; reverse jumping jack x 10-15 *straddling noodle: cycling breast stroke arms x 6 widths; hip add/abd; hip flex/ext * single leg superman x10 with UE on yellow hand floats *sitting balance on yellow noodle (swing like) raising ue out of water  unilaterally then bilat; hands on lap * Unsupported:  LE swings into hip abdct/ addct (crossing midline) x 12, hip flex/ext x 12   Pt requires the buoyancy and hydrostatic pressure of water  for support, and to offload joints by unweighting joint load by at least 50 % in navel deep water  and by at least 75-80% in chest to  neck deep water .  Viscosity of the water  is needed for resistance of strengthening. Water  current perturbations provides challenge to standing balance requiring increased core activation.  PATIENT EDUCATION:  Education details: aquatic therapy exercise progressions/ modifications  Person educated: Patient Education method: Explanation Education comprehension: verbalized understanding  HOME EXERCISE PROGRAM: TBA  ASSESSMENT:  CLINICAL IMPRESSION: Continued improvement with standing balance submerged. Progressed hip strengthening as well as standing balance.  She begins land based intervention next session and is ready for added challenge.  Pain sensitivity low.  Goals on going.  Please test 5 x STS and TUG if time allows.   Initial Impression Patient is a 78 y.o. f who was seen today for physical therapy evaluation and treatment for LBP. She presents with moderate pain sensitivity throughout lumbar spine through bilat hips with radicular pattern into right buttock. Main complaint is being limited  primarily with amb >10 minutes unsupported.  She is not currently using a cane for amb.  Does tolerate walking through stores push a cart.  Pt demonstrates significant hip weakness bilaterally and is a high fall risk as per Lars Balance scale. She will benefit from skilled PT both aquatic and land based in order to improve function and safety.  OBJECTIVE IMPAIRMENTS: Abnormal gait, decreased activity tolerance, decreased balance, decreased knowledge of use of DME, decreased mobility, difficulty walking, decreased strength, improper body mechanics, postural dysfunction, obesity, and pain.   ACTIVITY LIMITATIONS: carrying, lifting, standing, transfers, and locomotion level  PARTICIPATION LIMITATIONS: meal prep, cleaning, laundry, driving, shopping, and community activity   PERSONAL FACTORS:  3+ comorbidities: OA, asthma, depression, HTN  are also affecting patient's functional outcome.   REHAB  POTENTIAL: Good  CLINICAL DECISION MAKING: Evolving/moderate complexity  EVALUATION COMPLEXITY: Moderate   GOALS: Goals reviewed with patient? Yes  SHORT TERM GOALS: Target date: 08/20/23  Pt will tolerate full aquatic sessions consistently without increase in pain and with improving function to demonstrate good toleration and effectiveness of intervention.  Baseline: Goal status: Met 08/04/23  2.  Pt will tolerate walking to and from setting along with an entire aquatic session without excessive fatigue or pain To demonstrate improved toleration to activity. Baseline:  Goal status: Met 08/04/23  3.  Pt will improve on 5 X STS test to <or=  12s  to demonstrate improving functional lower extremity strength, transitional movements, and balance Baseline: 14.99 Goal status: INITIAL  4.  Pt will complete tandem stance and SLS in 3.6 ft holding positions x >15 s without ue support Baseline:  Goal status:Met 08/18/23  5. Pt will improve on Tug test to <or= 13s  to demonstrate improvement in lower extremity function, mobility and decreased fall risk. Baseline: 15.22 Goal status: INITIAL      LONG TERM GOALS: Target date: 09/23/23  Pt to meet stated Foto Goal Baseline: 53% 08/18/23 Goal status: Met 08/18/23  2.  Pt to report improved ability to step up into SUV safely. Baseline:  Goal status: INITIAL  3.  Pt will be indep with final HEP's (land and aquatic as appropriate) for continued management of condition Baseline:  Goal status: INITIAL  4.  Pt will improve strength in bilateral hips by at least 10lbs to demonstrate improved overall physical function  Baseline:  Goal status: INITIAL  5.  Pt will improve on Berg balance test to >/= 50/56 to demonstrate a decrease in fall risk. Baseline: 32/56 Goal status: INITIAL  6.  Pt will amb community distances with or without ad without limitation to pain Baseline:  Goal status: INITIAL  PLAN:  PT FREQUENCY: 1-2x/week  PT  DURATION: 8 weeks  PLANNED INTERVENTIONS: 97164- PT Re-evaluation, 97110-Therapeutic exercises, 97530- Therapeutic activity, 97112- Neuromuscular re-education, 97535- Self Care, 02859- Manual therapy, [phone]- Gait training, [phone]- Orthotic Fit/training, [phone]- Aquatic Therapy, [phone]- Ionotophoresis 4mg /ml Dexamethasone , Patient/Family education, Balance training, Stair training, Taping, Dry Needling, Joint mobilization, DME instructions, Cryotherapy, and Moist heat.  PLAN FOR NEXT SESSION: Aquatic: core and hip strengthening/ROM; balance retraining; posture retraining Land:Gait tolerance, posture and body mechanics, strengthening and stretching; HEP  Ronal Foots) Kiah Keay MPT 08/23/23 2:51 PM [facility] [address] [place], [place], [address] Phone: [phone]   Fax:  [phone]

## 2023-08-24 DIAGNOSIS — M1990 Unspecified osteoarthritis, unspecified site: Secondary | ICD-10-CM | POA: Diagnosis not present

## 2023-08-24 DIAGNOSIS — E042 Nontoxic multinodular goiter: Secondary | ICD-10-CM | POA: Diagnosis not present

## 2023-08-24 DIAGNOSIS — D508 Other iron deficiency anemias: Secondary | ICD-10-CM | POA: Diagnosis not present

## 2023-08-24 DIAGNOSIS — N390 Urinary tract infection, site not specified: Secondary | ICD-10-CM | POA: Diagnosis not present

## 2023-08-24 DIAGNOSIS — E669 Obesity, unspecified: Secondary | ICD-10-CM | POA: Diagnosis not present

## 2023-08-24 DIAGNOSIS — I10 Essential (primary) hypertension: Secondary | ICD-10-CM | POA: Diagnosis not present

## 2023-08-24 DIAGNOSIS — Z0000 Encounter for general adult medical examination without abnormal findings: Secondary | ICD-10-CM | POA: Diagnosis not present

## 2023-08-24 DIAGNOSIS — E039 Hypothyroidism, unspecified: Secondary | ICD-10-CM | POA: Diagnosis not present

## 2023-08-24 DIAGNOSIS — N1831 Chronic kidney disease, stage 3a: Secondary | ICD-10-CM | POA: Diagnosis not present

## 2023-08-24 DIAGNOSIS — Z1339 Encounter for screening examination for other mental health and behavioral disorders: Secondary | ICD-10-CM | POA: Diagnosis not present

## 2023-08-24 DIAGNOSIS — K219 Gastro-esophageal reflux disease without esophagitis: Secondary | ICD-10-CM | POA: Diagnosis not present

## 2023-08-24 DIAGNOSIS — I129 Hypertensive chronic kidney disease with stage 1 through stage 4 chronic kidney disease, or unspecified chronic kidney disease: Secondary | ICD-10-CM | POA: Diagnosis not present

## 2023-08-24 DIAGNOSIS — Z1331 Encounter for screening for depression: Secondary | ICD-10-CM | POA: Diagnosis not present

## 2023-08-25 ENCOUNTER — Ambulatory Visit (HOSPITAL_BASED_OUTPATIENT_CLINIC_OR_DEPARTMENT_OTHER): Payer: Medicare Other | Admitting: Physical Therapy

## 2023-08-25 DIAGNOSIS — R293 Abnormal posture: Secondary | ICD-10-CM | POA: Diagnosis not present

## 2023-08-25 DIAGNOSIS — R2681 Unsteadiness on feet: Secondary | ICD-10-CM

## 2023-08-25 DIAGNOSIS — M5459 Other low back pain: Secondary | ICD-10-CM

## 2023-08-25 NOTE — Therapy (Signed)
 OUTPATIENT PHYSICAL THERAPY THORACOLUMBAR TREATMENT   Patient Name: AIRIANA Garcia MRN: [id number] DOB:Aug 19, 1945, 78 y.o., female Today's Date: 08/26/2023  END OF SESSION:  PT End of Session - 08/25/23 1501     Visit Number 8    Number of Visits 16    Date for PT Re-Evaluation 09/23/23    Authorization Type MCR A&B    PT Start Time 1455    PT Stop Time 1529    PT Time Calculation (min) 34 min    Activity Tolerance Patient tolerated treatment well    Behavior During Therapy [facility] for tasks assessed/performed              Past Medical History:  Diagnosis Date   Arthritis    Asthma    Bronchitis 03/2016   Chronic kidney disease    stage 3   Colon polyps    tubular adenoma   Constipation    Depression    pt denies   Diverticulosis    GERD (gastroesophageal reflux disease)    Hemorrhoids    History of hiatal hernia    Hyperlipidemia    Hypertension    Hypothyroidism    Shortness of breath    with exertion on occasion   Sinusitis    Skin cancer    Past Surgical History:  Procedure Laterality Date   ABDOMINAL HYSTERECTOMY     BICEPT TENODESIS Right 04/26/2023   Procedure: RIGHT BICEPS TENODESIS;  Surgeon: Addie Cordella Hamilton, MD;  Location: [facility] OR;  Service: Orthopedics;  Laterality: Right;   CARPOMETACARPAL (CMC) FUSION OF THUMB Bilateral    CATARACT EXTRACTION, BILATERAL     CHOLECYSTECTOMY     COLONOSCOPY  02/12/2011   HAMMER TOE SURGERY Right    LAPAROSCOPIC APPENDECTOMY N/A 08/26/2021   Procedure: APPENDECTOMY LAPAROSCOPIC WITH LYSIS OF ADHESIONS;  Surgeon: Sheldon Standing, MD;  Location: [facility] ORS;  Service: General;  Laterality: N/A;   NASAL SINUS SURGERY     POLYPECTOMY     REVERSE SHOULDER ARTHROPLASTY Right 04/26/2023   Procedure: RIGHT REVERSE SHOULDER ARTHROPLASTY;  Surgeon: Addie Cordella Hamilton, MD;  Location: [facility] OR;  Service: Orthopedics;  Laterality: Right;   TOTAL HIP ARTHROPLASTY  12/10/2011   Procedure: TOTAL HIP ARTHROPLASTY ANTERIOR APPROACH;   Surgeon: Lonni CINDERELLA Poli, MD;  Location: [facility] ORS;  Service: Orthopedics;  Laterality: Right;  Right Total Hip Arthroplasty, Anterior Approach   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   Rotator cuff tendonitis, right 07/02/2022   Nausea 12/02/2021   Bright red rectal bleeding 09/15/2021   History of colonic polyps 07/13/2021   Gastroenteritis 09/09/2020   Anemia 03/06/2020   Hypothyroidism 02/04/2020   Depression 01/24/2020   Asthma 01/24/2020   CKD (chronic kidney disease), stage III (HCC) 01/24/2020   Acute on chronic renal failure (HCC) 01/24/2020   Constipation 12/20/2018   Pain in right hip 06/29/2017   Trochanteric bursitis, right hip 06/29/2017   History of total replacement of right hip 06/29/2017   Hemorrhoids 03/01/2017   Great toe pain, left 12/04/2016   Chronic right shoulder pain 12/04/2016   Non-toxic multinodular goiter 05/14/2013   Degenerative arthritis of hip 12/10/2011   HTN (hypertension) 07/06/2011   Hyperlipidemia 07/06/2011   Gastro-esophageal reflux disease without esophagitis 09/23/2009    PCP: Charlie Love MD  REFERRING PROVIDER: Williams, Megan E, NP   REFERRING  DIAG: M54.16 (ICD-10-CM) - Lumbar radiculopathy   Rationale for Evaluation and Treatment: Rehabilitation  THERAPY DIAG:  Other low back pain  Unsteadiness on feet  Abnormal posture  ONSET DATE: <5 yrs  SUBJECTIVE:                                                                                                                                                                                           SUBJECTIVE STATEMENT: Pt is responding well to aquatic therapy.  Pt thinks she may be more functional.  Pt was able to go through a full day without taking anything.  Pt states she was a little more active.  Normally her  pain radiates to R post hip though it has radiated to her post L hip the past couple of days.  When pt has pain, she sits down and the pain goes away.  Pt has signed up at the [facility] and plans to start an aquatic class next week.   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 Low back and right buttock pain. 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 Yes: NPRS scale: basically 0/10 in sitting currently Pain location: R shoulder, R hip, across lower back Pain description:dull  Aggravating factors: standing, walking 5-10 minutes Relieving factors: meds; lidocaine  patches, sitting  PRECAUTIONS: None  RED FLAGS: None   WEIGHT BEARING RESTRICTIONS: No  FALLS:  Has patient fallen in last 6 months? No  LIVING ENVIRONMENT: Lives with: lives alone   OCCUPATION:  Retired   PLOF:  Independent and Leisure: watch TV, puzzles  PATIENT GOALS: stand and walk for more than 10 minutes without cart or UE support; decrease pain.  NEXT MD VISIT: q 3-5 months  OBJECTIVE:  Note: Objective measures were completed at Evaluation unless otherwise noted.  DIAGNOSTIC FINDINGS:  6/23 Xray lumbar IMPRESSION: 1. 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 2. Multilevel neural foraminal narrowing severe on the left at L3-4 and moderate on the left at L2-3 and L4-5.  PATIENT SURVEYS:  FOTO Primary measure 42% with goal of 52% at 12th visit 08/18/23: 53%  COGNITION: Overall cognitive status: Within functional limits for tasks assessed     SENSATION: WFL  MUSCLE LENGTH: Hamstrings: WFL tested in sitting   POSTURE: rounded shoulders, forward head, and decreased lumbar lordosis  PALPATION: TTP lumbar spine paraspinals and multifidi bilaterally, glutes right sided  only  LUMBAR ROM:   Full  LOWER EXTREMITY strength:  Strength Right eval Left eval  Hip flexion 23.6 35.5  Hip extension    Hip abduction 22.4 27.4  Hip adduction    Hip internal rotation    Hip external rotation    Knee flexion    Knee extension 29.6 23.2  Ankle dorsiflexion    Ankle plantarflexion    Ankle inversion    Ankle eversion     (Blank rows = not tested)  LOWER EXTREMITY ROM:    WFL  LUMBAR SPECIAL TESTS:  Slump test: Negative  FUNCTIONAL TESTS:  5 times sit to stand: from pool bench 14.99 Timed up and go (TUG): 15.22 Berg 32/56 2 mwt 340ft  GAIT: Distance walked: 500 Assistive device utilized: None Level of assistance: Complete Independence Comments: guarded posture decreased arm swing, limited hip flex  TODAY'S TREATMENT:                                                                                                                              PT educated pt with correct performance and palpation of TrA contraction. Supine TrA contraction with and without 5 sec hold Supine marching with TrA Supine heel slides with TrA x 10 reps  LAQ  R:  approx 12 reps and 7 reps, L:  x10 reps, 5 reps Standing marching x 5 reps with TrA  PT assessed HS flexibility in supine and pt had good flexibility in bilat HS.   PT gave pt a HEP handout and educated pt in correct form and appropriate frequency.  PT instructed pt she should not have pain with HEP.     PATIENT EDUCATION:  Education details:  Person educated: Patient Education method: Programmer, Multimedia, demonstration, verbal and tactile cues, handout Education comprehension: verbalized understanding, returned demonstration, verbal and tactile cues required  HOME EXERCISE PROGRAM: Access Code: [email or web address] URL: [email or web address] Date: 08/25/2023 Prepared by: Mose Minerva  Exercises - Supine Transversus Abdominis Bracing - Hands on Stomach  - 2 x daily - 7 x weekly - 1-2 sets - 10 reps -  Supine March  - 1 x daily - 7 x weekly - 1-2 sets - 10 reps - Supine Core Control with Heel Slide  - 1 x daily - 7 x weekly - 1 sets - 10 reps  ASSESSMENT:  CLINICAL IMPRESSION: Pt is responding well to aquatic therapy and report improved functional tolerance.  When pt has pain, she sits down and the pain goes.  PT established HEP today.  Pt performed core exercises including performing and receiving education on TrA contraction.  Pt performed exercises well with cuing and instruction in correct form.  She was fatigued with exercises.  Pt gave pt a HEP handout and pt demonstrates good understanding.  Pt has good flexibility in bilat HS.  She responded well to Rx and reports having soreness after Rx.     OBJECTIVE IMPAIRMENTS: Abnormal gait, decreased activity tolerance, decreased balance, decreased knowledge of use of DME, decreased mobility, difficulty walking, decreased  strength, improper body mechanics, postural dysfunction, obesity, and pain.   ACTIVITY LIMITATIONS: carrying, lifting, standing, transfers, and locomotion level  PARTICIPATION LIMITATIONS: meal prep, cleaning, laundry, driving, shopping, and community activity   PERSONAL FACTORS:  3+ comorbidities: OA, asthma, depression, HTN  are also affecting patient's functional outcome.   REHAB POTENTIAL: Good  CLINICAL DECISION MAKING: Evolving/moderate complexity  EVALUATION COMPLEXITY: Moderate   GOALS: Goals reviewed with patient? Yes  SHORT TERM GOALS: Target date: 08/20/23  Pt will tolerate full aquatic sessions consistently without increase in pain and with improving function to demonstrate good toleration and effectiveness of intervention.  Baseline: Goal status: Met 08/04/23  2.  Pt will tolerate walking to and from setting along with an entire aquatic session without excessive fatigue or pain To demonstrate improved toleration to activity. Baseline:  Goal status: Met 08/04/23  3.  Pt will improve on 5 X STS test to  <or=  12s  to demonstrate improving functional lower extremity strength, transitional movements, and balance Baseline: 14.99 Goal status: INITIAL  4.  Pt will complete tandem stance and SLS in 3.6 ft holding positions x >15 s without ue support Baseline:  Goal status:Met 08/18/23  5. Pt will improve on Tug test to <or= 13s  to demonstrate improvement in lower extremity function, mobility and decreased fall risk. Baseline: 15.22 Goal status: INITIAL      LONG TERM GOALS: Target date: 09/23/23  Pt to meet stated Foto Goal Baseline: 53% 08/18/23 Goal status: Met 08/18/23  2.  Pt to report improved ability to step up into SUV safely. Baseline:  Goal status: INITIAL  3.  Pt will be indep with final HEP's (land and aquatic as appropriate) for continued management of condition Baseline:  Goal status: INITIAL  4.  Pt will improve strength in bilateral hips by at least 10lbs to demonstrate improved overall physical function  Baseline:  Goal status: INITIAL  5.  Pt will improve on Berg balance test to >/= 50/56 to demonstrate a decrease in fall risk. Baseline: 32/56 Goal status: INITIAL  6.  Pt will amb community distances with or without ad without limitation to pain Baseline:  Goal status: INITIAL  PLAN:  PT FREQUENCY: 1-2x/week  PT DURATION: 8 weeks  PLANNED INTERVENTIONS: 97164- PT Re-evaluation, 97110-Therapeutic exercises, 97530- Therapeutic activity, 97112- Neuromuscular re-education, 97535- Self Care, 02859- Manual therapy, [phone]- Gait training, [phone]- Orthotic Fit/training, [phone]- Aquatic Therapy, [phone]- Ionotophoresis 4mg /ml Dexamethasone , Patient/Family education, Balance training, Stair training, Taping, Dry Needling, Joint mobilization, DME instructions, Cryotherapy, and Moist heat.  PLAN FOR NEXT SESSION: Aquatic: core and hip strengthening/ROM; balance retraining; posture retraining.  Pt is planning to start aquatic classes next week. Land:Gait tolerance, posture and  body mechanics, strengthening and stretching; HEP  Leigh Minerva III PT, DPT 08/26/23 3:39 PM

## 2023-08-26 ENCOUNTER — Encounter (HOSPITAL_BASED_OUTPATIENT_CLINIC_OR_DEPARTMENT_OTHER): Payer: Self-pay | Admitting: Physical Therapy

## 2023-08-30 ENCOUNTER — Encounter (HOSPITAL_BASED_OUTPATIENT_CLINIC_OR_DEPARTMENT_OTHER): Payer: Self-pay | Admitting: Physical Therapy

## 2023-08-30 ENCOUNTER — Ambulatory Visit (HOSPITAL_BASED_OUTPATIENT_CLINIC_OR_DEPARTMENT_OTHER): Payer: Medicare Other | Admitting: Physical Therapy

## 2023-08-30 DIAGNOSIS — R293 Abnormal posture: Secondary | ICD-10-CM

## 2023-08-30 DIAGNOSIS — R2681 Unsteadiness on feet: Secondary | ICD-10-CM | POA: Diagnosis not present

## 2023-08-30 DIAGNOSIS — M5459 Other low back pain: Secondary | ICD-10-CM

## 2023-08-30 NOTE — Therapy (Signed)
 OUTPATIENT PHYSICAL THERAPY THORACOLUMBAR TREATMENT   Patient Name: Cassidy Garcia MRN: [id number] DOB:Apr 15, 1946, 78 y.o., female Today's Date: 08/30/2023  END OF SESSION:  PT End of Session - 08/30/23 1446     Visit Number 9    Number of Visits 16    Date for PT Re-Evaluation 09/23/23    Authorization Type MCR A&B    PT Start Time 1446    PT Stop Time 1530    PT Time Calculation (min) 44 min    Activity Tolerance Patient tolerated treatment well    Behavior During Therapy [facility] for tasks assessed/performed              Past Medical History:  Diagnosis Date   Arthritis    Asthma    Bronchitis 03/2016   Chronic kidney disease    stage 3   Colon polyps    tubular adenoma   Constipation    Depression    pt denies   Diverticulosis    GERD (gastroesophageal reflux disease)    Hemorrhoids    History of hiatal hernia    Hyperlipidemia    Hypertension    Hypothyroidism    Shortness of breath    with exertion on occasion   Sinusitis    Skin cancer    Past Surgical History:  Procedure Laterality Date   ABDOMINAL HYSTERECTOMY     BICEPT TENODESIS Right 04/26/2023   Procedure: RIGHT BICEPS TENODESIS;  Surgeon: Addie Cordella Hamilton, MD;  Location: [facility] OR;  Service: Orthopedics;  Laterality: Right;   CARPOMETACARPAL (CMC) FUSION OF THUMB Bilateral    CATARACT EXTRACTION, BILATERAL     CHOLECYSTECTOMY     COLONOSCOPY  02/12/2011   HAMMER TOE SURGERY Right    LAPAROSCOPIC APPENDECTOMY N/A 08/26/2021   Procedure: APPENDECTOMY LAPAROSCOPIC WITH LYSIS OF ADHESIONS;  Surgeon: Sheldon Standing, MD;  Location: [facility] ORS;  Service: General;  Laterality: N/A;   NASAL SINUS SURGERY     POLYPECTOMY     REVERSE SHOULDER ARTHROPLASTY Right 04/26/2023   Procedure: RIGHT REVERSE SHOULDER ARTHROPLASTY;  Surgeon: Addie Cordella Hamilton, MD;  Location: [facility] OR;  Service: Orthopedics;  Laterality: Right;   TOTAL HIP ARTHROPLASTY  12/10/2011   Procedure: TOTAL HIP ARTHROPLASTY ANTERIOR APPROACH;   Surgeon: Lonni CINDERELLA Poli, MD;  Location: [facility] ORS;  Service: Orthopedics;  Laterality: Right;  Right Total Hip Arthroplasty, Anterior Approach   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   Rotator cuff tendonitis, right 07/02/2022   Nausea 12/02/2021   Bright red rectal bleeding 09/15/2021   History of colonic polyps 07/13/2021   Gastroenteritis 09/09/2020   Anemia 03/06/2020   Hypothyroidism 02/04/2020   Depression 01/24/2020   Asthma 01/24/2020   CKD (chronic kidney disease), stage III (HCC) 01/24/2020   Acute on chronic renal failure (HCC) 01/24/2020   Constipation 12/20/2018   Pain in right hip 06/29/2017   Trochanteric bursitis, right hip 06/29/2017   History of total replacement of right hip 06/29/2017   Hemorrhoids 03/01/2017   Great toe pain, left 12/04/2016   Chronic right shoulder pain 12/04/2016   Non-toxic multinodular goiter 05/14/2013   Degenerative arthritis of hip 12/10/2011   HTN (hypertension) 07/06/2011   Hyperlipidemia 07/06/2011   Gastro-esophageal reflux disease without esophagitis 09/23/2009    PCP: Charlie Love MD  REFERRING PROVIDER: Williams, Megan E, NP   REFERRING  DIAG: M54.16 (ICD-10-CM) - Lumbar radiculopathy   Rationale for Evaluation and Treatment: Rehabilitation  THERAPY DIAG:  Other low back pain  Unsteadiness on feet  Abnormal posture  ONSET DATE: <5 yrs  SUBJECTIVE:                                                                                                                                                                                           SUBJECTIVE STATEMENT: Pt reports increase in LBP after 1st land visit.  0/10 to 4-5/10 with activity which had subsided up until that point.  She began walking water  program at [facility] today and reports some le  fatigue.   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 Low back and right buttock pain. 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 Yes: NPRS scale: basically 4/10 in sitting currently Pain location: R shoulder, R hip, across lower back Pain description:dull  Aggravating factors: standing, walking 5-10 minutes Relieving factors: meds; lidocaine  patches, sitting  PRECAUTIONS: None  RED FLAGS: None   WEIGHT BEARING RESTRICTIONS: No  FALLS:  Has patient fallen in last 6 months? No  LIVING ENVIRONMENT: Lives with: lives alone   OCCUPATION:  Retired   PLOF:  Independent and Leisure: watch TV, puzzles  PATIENT GOALS: stand and walk for more than 10 minutes without cart or UE support; decrease pain.  NEXT MD VISIT: q 3-5 months  OBJECTIVE:  Note: Objective measures were completed at Evaluation unless otherwise noted.  DIAGNOSTIC FINDINGS:  6/23 Xray lumbar IMPRESSION: 1. 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 2. Multilevel neural foraminal narrowing severe on the left at L3-4 and moderate on the left at L2-3 and L4-5.  PATIENT SURVEYS:  FOTO Primary measure 42% with goal of 52% at 12th visit 08/18/23: 53%  COGNITION: Overall cognitive status: Within functional limits for tasks assessed     SENSATION: WFL  MUSCLE LENGTH: Hamstrings: WFL tested in sitting   POSTURE: rounded shoulders, forward head, and decreased lumbar lordosis  PALPATION: TTP lumbar spine paraspinals and multifidi bilaterally, glutes right sided only  LUMBAR ROM:   Full  LOWER EXTREMITY strength:     Strength Right eval Left eval  Hip flexion 23.6 35.5  Hip extension    Hip abduction 22.4 27.4  Hip adduction    Hip internal rotation    Hip external rotation     Knee flexion    Knee extension 29.6 23.2  Ankle dorsiflexion  Ankle plantarflexion    Ankle inversion    Ankle eversion     (Blank rows = not tested)  LOWER EXTREMITY ROM:    WFL  LUMBAR SPECIAL TESTS:  Slump test: Negative  FUNCTIONAL TESTS:  5 times sit to stand: from pool bench 14.99 Timed up and go (TUG): 15.22 Berg 32/56 2 mwt 351ft        08/30/23:  5 x STS: 13.99   TUG:13.36  GAIT: Distance walked: 500 Assistive device utilized: None Level of assistance: Complete Independence Comments: guarded posture decreased arm swing, limited hip flex  TODAY'S TREATMENT:                                                                                                                               5x STS and Tug completed  Pt seen for aquatic therapy today.  Treatment took place in water  3.5-4.75 ft in depth at the [facility] pool. Temp of water  was 91.  Pt entered/exited the pool via stairs and step to pattern with hand rail.    *walking *L stretch *[place] with step back x 10 each, (with ease) * single leg superman x10 with UE on yellow hand floats *TrA set using solid noodle pull down to thighs in wide stance then staggered x 10 *Yellow noodle posteriorly wrapped around chest: knees t o 2 x 10.  Cues for TrA bracing *straddling noodle: cycling breast stroke arms x 6 widths; hip add/abd; hip flex/ext * Unsupported:  LE swings into hip abdct/ addct (crossing midline) x 12, hip flex/ext x 12.  Completed in 3.39ft (added challenge)  Pt requires the buoyancy and hydrostatic pressure of water  for support, and to offload joints by unweighting joint load by at least 50 % in navel deep water  and by at least 75-80% in chest to neck deep water .  Viscosity of the water  is needed for resistance of strengthening. Water  current perturbations provides challenge to standing balance requiring increased core activation.  Prior land session: PT educated pt with correct  performance and palpation of TrA contraction. Supine TrA contraction with and without 5 sec hold Supine marching with TrA Supine heel slides with TrA x 10 reps  LAQ  R:  approx 12 reps and 7 reps, L:  x10 reps, 5 reps Standing marching x 5 reps with TrA  PT assessed HS flexibility in supine and pt had good flexibility in bilat HS.   PT gave pt a HEP handout and educated pt in correct form and appropriate frequency.  PT instructed pt she should not have pain with HEP.     PATIENT EDUCATION:  Education details:  Person educated: Patient Education method: Programmer, Multimedia, demonstration, verbal and tactile cues, handout Education comprehension: verbalized understanding, returned demonstration, verbal and tactile cues required  HOME EXERCISE PROGRAM: Access Code: [email or web address] URL: [email or web address] Date: 08/25/2023 Prepared by: Mose Minerva  Exercises - Supine Transversus Abdominis Bracing - Hands on Stomach  -  2 x daily - 7 x weekly - 1-2 sets - 10 reps - Supine March  - 1 x daily - 7 x weekly - 1-2 sets - 10 reps - Supine Core Control with Heel Slide  - 1 x daily - 7 x weekly - 1 sets - 10 reps  ASSESSMENT:  Pt reports some increase in LBP with addition of land session which focused on core strengthening. She is encouraged to continue with for further treatments to better gage benefits. She did begin water  walking program today at [facility] with some residual fatigue. Further instruction on modifying jumping or bouncing to marching if need be.  She VU.  Balance improving with ability to decrease submersion when completing balance tasks. Functional testing demonstrates improvement in times as evidence of improving functional lower extremity strength and transitional movements      OBJECTIVE IMPAIRMENTS: Abnormal gait, decreased activity tolerance, decreased balance, decreased knowledge of use of DME, decreased mobility, difficulty walking, decreased strength, improper body  mechanics, postural dysfunction, obesity, and pain.   ACTIVITY LIMITATIONS: carrying, lifting, standing, transfers, and locomotion level  PARTICIPATION LIMITATIONS: meal prep, cleaning, laundry, driving, shopping, and community activity   PERSONAL FACTORS:  3+ comorbidities: OA, asthma, depression, HTN  are also affecting patient's functional outcome.   REHAB POTENTIAL: Good  CLINICAL DECISION MAKING: Evolving/moderate complexity  EVALUATION COMPLEXITY: Moderate   GOALS: Goals reviewed with patient? Yes  SHORT TERM GOALS: Target date: 08/20/23  Pt will tolerate full aquatic sessions consistently without increase in pain and with improving function to demonstrate good toleration and effectiveness of intervention.  Baseline: Goal status: Met 08/04/23  2.  Pt will tolerate walking to and from setting along with an entire aquatic session without excessive fatigue or pain To demonstrate improved toleration to activity. Baseline:  Goal status: Met 08/04/23  3.  Pt will improve on 5 X STS test to <or=  12s  to demonstrate improving functional lower extremity strength, transitional movements, and balance Baseline: 14.99 Goal status: INITIAL  4.  Pt will complete tandem stance and SLS in 3.6 ft holding positions x >15 s without ue support Baseline:  Goal status:Met 08/18/23  5. Pt will improve on Tug test to <or= 13s  to demonstrate improvement in lower extremity function, mobility and decreased fall risk. Baseline: 15.22 Goal status: INITIAL      LONG TERM GOALS: Target date: 09/23/23  Pt to meet stated Foto Goal Baseline: 53% 08/18/23 Goal status: Met 08/18/23  2.  Pt to report improved ability to step up into SUV safely. Baseline:  Goal status: INITIAL  3.  Pt will be indep with final HEP's (land and aquatic as appropriate) for continued management of condition Baseline:  Goal status: INITIAL  4.  Pt will improve strength in bilateral hips by at least 10lbs to demonstrate  improved overall physical function  Baseline:  Goal status: INITIAL  5.  Pt will improve on Berg balance test to >/= 50/56 to demonstrate a decrease in fall risk. Baseline: 32/56 Goal status: INITIAL  6.  Pt will amb community distances with or without ad without limitation to pain Baseline:  Goal status: INITIAL  PLAN:  PT FREQUENCY: 1-2x/week  PT DURATION: 8 weeks  PLANNED INTERVENTIONS: [id number]- PT Re-evaluation, 97110-Therapeutic exercises, 97530- Therapeutic activity, 97112- Neuromuscular re-education, 97535- Self Care, [id number]- Manual therapy, [phone]- Gait training, [phone]- Orthotic Fit/training, [phone]- Aquatic Therapy, [phone]- Ionotophoresis 4mg /ml Dexamethasone , Patient/Family education, Balance training, Stair training, Taping, Dry Needling, Joint mobilization, DME instructions, Cryotherapy, and  Moist heat.  PLAN FOR NEXT SESSION: Aquatic: core and hip strengthening/ROM; balance retraining; posture retraining.  Pt is planning to start aquatic classes next week. Land:Gait tolerance, posture and body mechanics, strengthening and stretching; HEP  Ronal Foots) Zaahir Pickney MPT 08/30/23 2:53 PM [facility] [address] [place], [place], [address] Phone: [phone]   Fax:  [phone]

## 2023-09-01 ENCOUNTER — Encounter (HOSPITAL_BASED_OUTPATIENT_CLINIC_OR_DEPARTMENT_OTHER): Payer: Self-pay | Admitting: Physical Therapy

## 2023-09-01 ENCOUNTER — Ambulatory Visit (HOSPITAL_BASED_OUTPATIENT_CLINIC_OR_DEPARTMENT_OTHER): Payer: Medicare Other | Admitting: Physical Therapy

## 2023-09-01 DIAGNOSIS — R2681 Unsteadiness on feet: Secondary | ICD-10-CM | POA: Diagnosis not present

## 2023-09-01 DIAGNOSIS — R293 Abnormal posture: Secondary | ICD-10-CM

## 2023-09-01 DIAGNOSIS — M5459 Other low back pain: Secondary | ICD-10-CM | POA: Diagnosis not present

## 2023-09-01 NOTE — Therapy (Signed)
OUTPATIENT PHYSICAL THERAPY THORACOLUMBAR TREATMENT   Patient Name: Cassidy Garcia MRN: [id number] DOB:1945-11-17, 78 y.o., female Today's Date: 09/02/2023  END OF SESSION:  PT End of Session - 09/01/23 1511     Visit Number 10    Number of Visits 16    Date for PT Re-Evaluation 09/23/23    Authorization Type MCR A&B    PT Start Time 1455    PT Stop Time 1538    PT Time Calculation (min) 43 min    Activity Tolerance Patient tolerated treatment well    Behavior During Therapy [facility] for tasks assessed/performed               Past Medical History:  Diagnosis Date   Arthritis    Asthma    Bronchitis 03/2016   Chronic kidney disease    stage 3   Colon polyps    tubular adenoma   Constipation    Depression    pt denies   Diverticulosis    GERD (gastroesophageal reflux disease)    Hemorrhoids    History of hiatal hernia    Hyperlipidemia    Hypertension    Hypothyroidism    Shortness of breath    with exertion on occasion   Sinusitis    Skin cancer    Past Surgical History:  Procedure Laterality Date   ABDOMINAL HYSTERECTOMY     BICEPT TENODESIS Right 04/26/2023   Procedure: RIGHT BICEPS TENODESIS;  Surgeon: Cammy Copa, MD;  Location: [facility] OR;  Service: Orthopedics;  Laterality: Right;   CARPOMETACARPAL (CMC) FUSION OF THUMB Bilateral    CATARACT EXTRACTION, BILATERAL     CHOLECYSTECTOMY     COLONOSCOPY  02/12/2011   HAMMER TOE SURGERY Right    LAPAROSCOPIC APPENDECTOMY N/A 08/26/2021   Procedure: APPENDECTOMY LAPAROSCOPIC WITH LYSIS OF ADHESIONS;  Surgeon: Karie Soda, MD;  Location: [facility] ORS;  Service: General;  Laterality: N/A;   NASAL SINUS SURGERY     POLYPECTOMY     REVERSE SHOULDER ARTHROPLASTY Right 04/26/2023   Procedure: RIGHT REVERSE SHOULDER ARTHROPLASTY;  Surgeon: Cammy Copa, MD;  Location: [facility] OR;  Service: Orthopedics;  Laterality: Right;   TOTAL HIP ARTHROPLASTY  12/10/2011   Procedure: TOTAL HIP ARTHROPLASTY ANTERIOR APPROACH;   Surgeon: Kathryne Hitch, MD;  Location: [facility] ORS;  Service: Orthopedics;  Laterality: Right;  Right Total Hip Arthroplasty, Anterior Approach   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   Rotator cuff tendonitis, right 07/02/2022   Nausea 12/02/2021   Bright red rectal bleeding 09/15/2021   History of colonic polyps 07/13/2021   Gastroenteritis 09/09/2020   Anemia 03/06/2020   Hypothyroidism 02/04/2020   Depression 01/24/2020   Asthma 01/24/2020   CKD (chronic kidney disease), stage III (HCC) 01/24/2020   Acute on chronic renal failure (HCC) 01/24/2020   Constipation 12/20/2018   Pain in right hip 06/29/2017   Trochanteric bursitis, right hip 06/29/2017   History of total replacement of right hip 06/29/2017   Hemorrhoids 03/01/2017   Great toe pain, left 12/04/2016   Chronic right shoulder pain 12/04/2016   Non-toxic multinodular goiter 05/14/2013   Degenerative arthritis of hip 12/10/2011   HTN (hypertension) 07/06/2011   Hyperlipidemia 07/06/2011   Gastro-esophageal reflux disease without esophagitis 09/23/2009    PCP: Geoffry Paradise MD  REFERRING PROVIDER: Juanda Chance, NP  REFERRING DIAG: M54.16 (ICD-10-CM) - Lumbar radiculopathy   Rationale for Evaluation and Treatment: Rehabilitation  THERAPY DIAG:  Other low back pain  Unsteadiness on feet  Abnormal posture  ONSET DATE: <5 yrs  SUBJECTIVE:                                                                                                                                                                                           SUBJECTIVE STATEMENT: Pt states she had severely increased pain the following AM after prior land based treatment.  She performed a water walking class at the [facility] the following Tuesday and did too much.  Pt reports having  much increased pain after the water class.  She later had aquatic therapy that same day.  Pt states her pain is getting worse.  Sitting is bothering her now and it wasn't bothering her prior.  Pt is having pain in bilat posterior hips.  Pt reports improved performance of getting into/out of SUV.  She states she is safe with SUV transfers.  Pt states she was improving with community ambulation distance until last week's land based therapy.  Pt thinks aquatic therapy is helping.     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 Low back and right buttock pain. 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 Yes: NPRS scale: 3-4/10 in sitting currently Pain location: R shoulder, R hip, across lower back Pain description:dull  Aggravating factors: standing, walking 5-10 minutes Relieving factors: meds; lidocaine patches, sitting  PRECAUTIONS: None  RED FLAGS: None   WEIGHT BEARING RESTRICTIONS: No  FALLS:  Has patient fallen in last 6 months? No  LIVING ENVIRONMENT: Lives with: lives alone   OCCUPATION:  Retired   PLOF:  Independent and Leisure: watch TV, puzzles  PATIENT GOALS: stand and walk for more than 10 minutes without cart or UE support; decrease pain.  NEXT MD VISIT: q 3-5 months  OBJECTIVE:  Note: Objective measures were completed at Evaluation unless otherwise noted.  DIAGNOSTIC FINDINGS:  6/23 Xray lumbar IMPRESSION: 1. 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 2. Multilevel neural foraminal narrowing severe on the left at L3-4 and moderate on the left at L2-3 and L4-5.  TODAY'S TREATMENT:     LOWER EXTREMITY strength:     Strength Right eval Left eval Right 1/16 Left  1/16  Hip flexion 23.6 35.5 33.7 31.7  Hip extension      Hip  abduction 22.4  27.4 25.9 28.2  Hip adduction      Hip internal rotation      Hip external rotation      Knee flexion      Knee extension 29.6 23.2 46.0 32.9  Ankle dorsiflexion      Ankle plantarflexion      Ankle inversion      Ankle eversion       (Blank rows = not tested)   FUNCTIONAL TESTS:  5x STS test:  Initial/Current: 14.99 / 11.23  2 MWT:  Initial/ Current:  321 ft / 273 ft -- Pt states her legs felt weak                                                                                                                               PT educated pt with correct performance and palpation of TrA contraction. Supine TrA contraction with and without 5 sec hold Supine marching with TrA x 10 reps  LAQ  2x10 bilat     PATIENT EDUCATION:  Education details:  objective findings, progress, goal progress, exercise form, exercise response, POC, relevant anatomy Person educated: Patient Education method: Explanation, demonstration, verbal and tactile cues Education comprehension: verbalized understanding, returned demonstration, verbal and tactile cues required  HOME EXERCISE PROGRAM: Access Code: [email or web address] URL: [email or web address] Date: 08/25/2023 Prepared by: Aaron Edelman  Exercises - Supine Transversus Abdominis Bracing - Hands on Stomach  - 2 x daily - 7 x weekly - 1-2 sets - 10 reps - Supine March  - 1 x daily - 7 x weekly - 1-2 sets - 10 reps - Supine Core Control with Heel Slide  - 1 x daily - 7 x weekly - 1 sets - 10 reps  ASSESSMENT:  CLINICAL IMPRESSION: Pt presents to Rx reporting much increased pain after prior land based Rx.  Pt states her pain is getting worse.  She is having pain in sitting which she did not have before.  Pt demonstrates improved functional LE strength and performance of sit/stand transfers with 5x STS test improving from 14.99 to 11.23.  Pt demonstrates improved strength in bilat LE's except L hip flexion as evidenced by HHD.  Pt  has decreased distance on 2 MWT by 48 ft today.  Pt has been responding well to aquatic therapy.  PT worked on TrA contractions to improve core strength after re-assessing pt.  Though she states her pain is worse, is bothering her now and it wasn't bothering her prior.  Pt has made good progress toward goals.  Pt has met all STG's except partially meeting #5.  Pt met LTG's #1,2 and partially #4.  Pt should benefit from cont skilled PT to address impairments and ongoing goals and to improve overall function.    OBJECTIVE IMPAIRMENTS: Abnormal gait, decreased activity tolerance, decreased balance, decreased knowledge of use of DME, decreased mobility, difficulty walking, decreased strength, improper body mechanics, postural dysfunction, obesity, and pain.   ACTIVITY LIMITATIONS:  carrying, lifting, standing, transfers, and locomotion level  PARTICIPATION LIMITATIONS: meal prep, cleaning, laundry, driving, shopping, and community activity   PERSONAL FACTORS:  3+ comorbidities: OA, asthma, depression, HTN  are also affecting patient's functional outcome.   REHAB POTENTIAL: Good  CLINICAL DECISION MAKING: Evolving/moderate complexity  EVALUATION COMPLEXITY: Moderate   GOALS: Goals reviewed with patient? Yes  SHORT TERM GOALS: Target date: 08/20/23  Pt will tolerate full aquatic sessions consistently without increase in pain and with improving function to demonstrate good toleration and effectiveness of intervention.  Baseline: Goal status: Met 08/04/23  2.  Pt will tolerate walking to and from setting along with an entire aquatic session without excessive fatigue or pain To demonstrate improved toleration to activity. Baseline:  Goal status: Met 08/04/23  3.  Pt will improve on 5 X STS test to <or=  12s  to demonstrate improving functional lower extremity strength, transitional movements, and balance Baseline: 14.99 Goal status: GOAL MET  09/01/23  4.  Pt will complete tandem stance and SLS  in 3.6 ft holding positions x >15 s without ue support Baseline:  Goal status:Met 08/18/23  5. Pt will improve on Tug test to <or= 13s  to demonstrate improvement in lower extremity function, mobility and decreased fall risk. Baseline: 15.22 Goal status: PARTIALLY  MET  08/30/23      LONG TERM GOALS: Target date: 09/23/23  Pt to meet stated Foto Goal Baseline: 53% 08/18/23 Goal status: Met 08/18/23  2.  Pt to report improved ability to step up into SUV safely. Baseline:  Goal status: GOAL MET  09/01/23  3.  Pt will be indep with final HEP's (land and aquatic as appropriate) for continued management of condition Baseline:  Goal status: INITIAL  4.  Pt will improve strength in bilateral hips by at least 10lbs to demonstrate improved overall physical function  Baseline:  Goal status: 25% MET  5.  Pt will improve on Berg balance test to >/= 50/56 to demonstrate a decrease in fall risk. Baseline: 32/56 Goal status: INITIAL  6.  Pt will amb community distances with or without ad without limitation to pain Baseline:  Goal status: ONGOING  09/01/23  PLAN:  PT FREQUENCY: 1-2x/week  PT DURATION:  3-4 weeks   PLANNED INTERVENTIONS: [id number]- PT Re-evaluation, 97110-Therapeutic exercises, 97530- Therapeutic activity, 97112- Neuromuscular re-education, 97535- Self Care, 65784- Manual therapy, [phone]- Gait training, [phone]- Orthotic Fit/training, [phone]- Aquatic Therapy, [phone]- Ionotophoresis 4mg /ml Dexamethasone, Patient/Family education, Balance training, Stair training, Taping, Dry Needling, Joint mobilization, DME instructions, Cryotherapy, and Moist heat.  PLAN FOR NEXT SESSION:  Pt to continue with aquatic therapy and possibly land therapy if she tolerates it well.  Aquatic: core and hip strengthening/ROM; balance retraining; posture retraining.  Pt is planning to start aquatic classes next week. Land:Gait tolerance, posture and body mechanics, strengthening and stretching; HEP  Audie Clear  III PT, DPT 09/02/23 1:16 PM

## 2023-09-02 ENCOUNTER — Other Ambulatory Visit: Payer: Self-pay | Admitting: Surgical

## 2023-09-02 MED ORDER — ACETAMINOPHEN-CODEINE 300-30 MG PO TABS
1.0000 | ORAL_TABLET | Freq: Every day | ORAL | 0 refills | Status: DC | PRN
Start: 2023-09-02 — End: 2023-12-12

## 2023-09-02 NOTE — Telephone Encounter (Signed)
Refilled Tylenol #3 to take as needed for one prescription

## 2023-09-06 ENCOUNTER — Ambulatory Visit (HOSPITAL_BASED_OUTPATIENT_CLINIC_OR_DEPARTMENT_OTHER): Payer: Medicare Other | Admitting: Physical Therapy

## 2023-09-06 ENCOUNTER — Encounter (HOSPITAL_BASED_OUTPATIENT_CLINIC_OR_DEPARTMENT_OTHER): Payer: Self-pay | Admitting: Physical Therapy

## 2023-09-06 DIAGNOSIS — R2681 Unsteadiness on feet: Secondary | ICD-10-CM

## 2023-09-06 DIAGNOSIS — M5459 Other low back pain: Secondary | ICD-10-CM | POA: Diagnosis not present

## 2023-09-06 DIAGNOSIS — R293 Abnormal posture: Secondary | ICD-10-CM | POA: Diagnosis not present

## 2023-09-06 NOTE — Therapy (Signed)
OUTPATIENT PHYSICAL THERAPY THORACOLUMBAR TREATMENT   Patient Name: Cassidy Garcia MRN: [id number] DOB:May 11, 1946, 78 y.o., female Today's Date: 09/06/2023  END OF SESSION:  PT End of Session - 09/06/23 1531     Visit Number 11    Number of Visits 16    Date for PT Re-Evaluation 09/23/23    Authorization Type MCR A&B    PT Start Time 1448    PT Stop Time 1528    PT Time Calculation (min) 40 min    Activity Tolerance Patient tolerated treatment well    Behavior During Therapy [facility] for tasks assessed/performed                Past Medical History:  Diagnosis Date   Arthritis    Asthma    Bronchitis 03/2016   Chronic kidney disease    stage 3   Colon polyps    tubular adenoma   Constipation    Depression    pt denies   Diverticulosis    GERD (gastroesophageal reflux disease)    Hemorrhoids    History of hiatal hernia    Hyperlipidemia    Hypertension    Hypothyroidism    Shortness of breath    with exertion on occasion   Sinusitis    Skin cancer    Past Surgical History:  Procedure Laterality Date   ABDOMINAL HYSTERECTOMY     BICEPT TENODESIS Right 04/26/2023   Procedure: RIGHT BICEPS TENODESIS;  Surgeon: Cammy Copa, MD;  Location: [facility] OR;  Service: Orthopedics;  Laterality: Right;   CARPOMETACARPAL (CMC) FUSION OF THUMB Bilateral    CATARACT EXTRACTION, BILATERAL     CHOLECYSTECTOMY     COLONOSCOPY  02/12/2011   HAMMER TOE SURGERY Right    LAPAROSCOPIC APPENDECTOMY N/A 08/26/2021   Procedure: APPENDECTOMY LAPAROSCOPIC WITH LYSIS OF ADHESIONS;  Surgeon: Karie Soda, MD;  Location: [facility] ORS;  Service: General;  Laterality: N/A;   NASAL SINUS SURGERY     POLYPECTOMY     REVERSE SHOULDER ARTHROPLASTY Right 04/26/2023   Procedure: RIGHT REVERSE SHOULDER ARTHROPLASTY;  Surgeon: Cammy Copa, MD;  Location: [facility] OR;  Service: Orthopedics;  Laterality: Right;   TOTAL HIP ARTHROPLASTY  12/10/2011   Procedure: TOTAL HIP ARTHROPLASTY ANTERIOR APPROACH;   Surgeon: Kathryne Hitch, MD;  Location: [facility] ORS;  Service: Orthopedics;  Laterality: Right;  Right Total Hip Arthroplasty, Anterior Approach   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   Rotator cuff tendonitis, right 07/02/2022   Nausea 12/02/2021   Bright red rectal bleeding 09/15/2021   History of colonic polyps 07/13/2021   Gastroenteritis 09/09/2020   Anemia 03/06/2020   Hypothyroidism 02/04/2020   Depression 01/24/2020   Asthma 01/24/2020   CKD (chronic kidney disease), stage III (HCC) 01/24/2020   Acute on chronic renal failure (HCC) 01/24/2020   Constipation 12/20/2018   Pain in right hip 06/29/2017   Trochanteric bursitis, right hip 06/29/2017   History of total replacement of right hip 06/29/2017   Hemorrhoids 03/01/2017   Great toe pain, left 12/04/2016   Chronic right shoulder pain 12/04/2016   Non-toxic multinodular goiter 05/14/2013   Degenerative arthritis of hip 12/10/2011   HTN (hypertension) 07/06/2011   Hyperlipidemia 07/06/2011   Gastro-esophageal reflux disease without esophagitis 09/23/2009    PCP: Geoffry Paradise MD  REFERRING PROVIDER: Juanda Chance, NP  REFERRING DIAG: M54.16 (ICD-10-CM) - Lumbar radiculopathy   Rationale for Evaluation and Treatment: Rehabilitation  THERAPY DIAG:  Other low back pain  Unsteadiness on feet  Abnormal posture  ONSET DATE: <5 yrs  SUBJECTIVE:                                                                                                                                                                                           SUBJECTIVE STATEMENT: I was sore againb after last land appointment but not too bad.  PN subjective: Pt states she had severely increased pain the following AM after prior land based treatment.  She performed a water  walking class at the [facility] the following Tuesday and did too much.  Pt reports having much increased pain after the water class.  She later had aquatic therapy that same day.  Pt states her pain is getting worse.  Sitting is bothering her now and it wasn't bothering her prior.  Pt is having pain in bilat posterior hips.  Pt reports improved performance of getting into/out of SUV.  She states she is safe with SUV transfers.  Pt states she was improving with community ambulation distance until last week's land based therapy.  Pt thinks aquatic therapy is helping.     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 Low back and right buttock pain. 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 Yes: NPRS scale: 3/10 in sitting currently Pain location: R shoulder, R hip, across lower back Pain description:dull  Aggravating factors: standing, walking 5-10 minutes Relieving factors: meds; lidocaine patches, sitting  PRECAUTIONS: None  RED FLAGS: None   WEIGHT BEARING RESTRICTIONS: No  FALLS:  Has patient fallen in last 6 months? No  LIVING ENVIRONMENT: Lives with: lives alone   OCCUPATION:  Retired   PLOF:  Independent and Leisure: watch TV, puzzles  PATIENT GOALS: stand and walk for more than 10 minutes without cart or UE support; decrease pain.  NEXT MD VISIT: q 3-5 months  OBJECTIVE:  Note: Objective measures were completed at Evaluation unless otherwise noted.  DIAGNOSTIC FINDINGS:  6/23 Xray lumbar IMPRESSION: 1. 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 2. Multilevel neural foraminal narrowing severe on the left at L3-4 and moderate on the left at L2-3 and L4-5.  TODAY'S TREATMENT:     LOWER EXTREMITY strength:     Strength Right eval Left eval  Right 1/16 Left  1/16  Hip flexion  23.6 35.5 33.7 31.7  Hip extension      Hip abduction 22.4 27.4 25.9 28.2  Hip adduction      Hip internal rotation      Hip external rotation      Knee flexion      Knee extension 29.6 23.2 46.0 32.9  Ankle dorsiflexion      Ankle plantarflexion      Ankle inversion      Ankle eversion       (Blank rows = not tested)   FUNCTIONAL TESTS:  5x STS test:  Initial/Current: 14.99 / 11.23  2 MWT:  Initial/ Current:  321 ft / 273 ft -- Pt states her legs felt weak   Treatment  09/06/23  Pt seen for aquatic therapy today.  Treatment took place in water 3.5-4.75 ft in depth at the [facility] pool. Temp of water was 91.  Pt entered/exited the pool via stairs and step to pattern with hand rail.     *walking *Attempted forward to back pendulum increased rue pain *side to side pendulum. Cues for execution, abd bracing and pacing x 10 *[place] with step back x 10 each *prone fly (modified for r shoulder) *TrA set using solid noodle pull down to thighs in wide stance then staggered x 10 *braiding x 4 widths ue support noodle * Unsupported:  LE swings into hip abdct/ addct (crossing midline) x 12, hip flex/ext x 12.  Completed in 3.83ft (good  challenge) * single leg superman x6 R/L with UE unsupported *straddling noodle: cycling breast stroke arms x 6 widths; hip add/abd; hip flex/ext *sitting balance on yellow noodle- >hands on lap->alternating hand in air hand on lap->hand elevated    Pt requires the buoyancy and hydrostatic pressure of water for support, and to offload joints by unweighting joint load by at least 50 % in navel deep water and by at least 75-80% in chest to neck deep water.  Viscosity of the water is needed for resistance of strengthening. Water current perturbations provides challenge to standing balance requiring increased core activation.   PATIENT EDUCATION:  Education details:  objective findings, progress, goal  progress, exercise form, exercise response, POC, relevant anatomy Person educated: Patient Education method: Explanation, demonstration, verbal and tactile cues Education comprehension: verbalized understanding, returned demonstration, verbal and tactile cues required  HOME EXERCISE PROGRAM: Access Code: [email or web address] URL: [email or web address] Date: 08/25/2023 Prepared by: Aaron Edelman  Exercises - Supine Transversus Abdominis Bracing - Hands on Stomach  - 2 x daily - 7 x weekly - 1-2 sets - 10 reps - Supine March  - 1 x daily - 7 x weekly - 1-2 sets - 10 reps - Supine Core Control with Heel Slide  - 1 x daily - 7 x weekly - 1 sets - 10 reps  ASSESSMENT:  CLINICAL IMPRESSION: Progressed core strengthening and balance with good tolerance. Pt advances herself into pendulums adding movement when waiting for therapy to begin.  Advanced pendulums with instruction on proper execution.  She is unable to tolerate forward to back due to left shoulder pain.  Was able to execute side to side. Cues continue for core engagement throughout.No pain with session with the progression of challenges.  She does report increase in pain again after last land based session.  Goals ongoing.   PN Pt presents to Rx reporting much increased pain after prior land based Rx.  Pt states her pain is getting worse.  She is having pain in sitting which  she did not have before.  Pt demonstrates improved functional LE strength and performance of sit/stand transfers with 5x STS test improving from 14.99 to 11.23.  Pt demonstrates improved strength in bilat LE's except L hip flexion as evidenced by HHD.  Pt has decreased distance on 2 MWT by 48 ft today.  Pt has been responding well to aquatic therapy.  PT worked on TrA contractions to improve core strength after re-assessing pt.  Though she states her pain is worse, is bothering her now and it wasn't bothering her prior.  Pt has made good progress toward goals.  Pt has  met all STG's except partially meeting #5.  Pt met LTG's #1,2 and partially #4.  Pt should benefit from cont skilled PT to address impairments and ongoing goals and to improve overall function.    OBJECTIVE IMPAIRMENTS: Abnormal gait, decreased activity tolerance, decreased balance, decreased knowledge of use of DME, decreased mobility, difficulty walking, decreased strength, improper body mechanics, postural dysfunction, obesity, and pain.   ACTIVITY LIMITATIONS: carrying, lifting, standing, transfers, and locomotion level  PARTICIPATION LIMITATIONS: meal prep, cleaning, laundry, driving, shopping, and community activity   PERSONAL FACTORS:  3+ comorbidities: OA, asthma, depression, HTN  are also affecting patient's functional outcome.   REHAB POTENTIAL: Good  CLINICAL DECISION MAKING: Evolving/moderate complexity  EVALUATION COMPLEXITY: Moderate   GOALS: Goals reviewed with patient? Yes  SHORT TERM GOALS: Target date: 08/20/23  Pt will tolerate full aquatic sessions consistently without increase in pain and with improving function to demonstrate good toleration and effectiveness of intervention.  Baseline: Goal status: Met 08/04/23  2.  Pt will tolerate walking to and from setting along with an entire aquatic session without excessive fatigue or pain To demonstrate improved toleration to activity. Baseline:  Goal status: Met 08/04/23  3.  Pt will improve on 5 X STS test to <or=  12s  to demonstrate improving functional lower extremity strength, transitional movements, and balance Baseline: 14.99 Goal status: GOAL MET  09/01/23  4.  Pt will complete tandem stance and SLS in 3.6 ft holding positions x >15 s without ue support Baseline:  Goal status:Met 08/18/23  5. Pt will improve on Tug test to <or= 13s  to demonstrate improvement in lower extremity function, mobility and decreased fall risk. Baseline: 15.22 Goal status: PARTIALLY  MET  08/30/23      LONG TERM GOALS: Target  date: 09/23/23  Pt to meet stated Foto Goal Baseline: 53% 08/18/23 Goal status: Met 08/18/23  2.  Pt to report improved ability to step up into SUV safely. Baseline:  Goal status: GOAL MET  09/01/23  3.  Pt will be indep with final HEP's (land and aquatic as appropriate) for continued management of condition Baseline:  Goal status: INITIAL  4.  Pt will improve strength in bilateral hips by at least 10lbs to demonstrate improved overall physical function  Baseline:  Goal status: 25% MET  5.  Pt will improve on Berg balance test to >/= 50/56 to demonstrate a decrease in fall risk. Baseline: 32/56 Goal status: INITIAL  6.  Pt will amb community distances with or without ad without limitation to pain Baseline:  Goal status: ONGOING  09/01/23  PLAN:  PT FREQUENCY: 1-2x/week  PT DURATION:  3-4 weeks   PLANNED INTERVENTIONS: 97164- PT Re-evaluation, 97110-Therapeutic exercises, 97530- Therapeutic activity, 97112- Neuromuscular re-education, 97535- Self Care, 95621- Manual therapy, [phone]- Gait training, [phone]- Orthotic Fit/training, [phone]- Aquatic Therapy, [phone]- Ionotophoresis 4mg /ml Dexamethasone, Patient/Family education, Balance training, Stair  training, Taping, Dry Needling, Joint mobilization, DME instructions, Cryotherapy, and Moist heat.  PLAN FOR NEXT SESSION:  Pt to continue with aquatic therapy and possibly land therapy if she tolerates it well.  Aquatic: core and hip strengthening/ROM; balance retraining; posture retraining.  Pt is planning to start aquatic classes next week. Land:Gait tolerance, posture and body mechanics, strengthening and stretching; HEP  Rushie Chestnut) Romesha Scherer MPT 09/06/23 3:32 PM [facility] [address] [place], [place], [address] Phone: [phone]   Fax:  [phone]

## 2023-09-08 ENCOUNTER — Encounter (HOSPITAL_BASED_OUTPATIENT_CLINIC_OR_DEPARTMENT_OTHER): Payer: Self-pay

## 2023-09-08 ENCOUNTER — Ambulatory Visit (HOSPITAL_BASED_OUTPATIENT_CLINIC_OR_DEPARTMENT_OTHER): Payer: Medicare Other

## 2023-09-08 DIAGNOSIS — M5459 Other low back pain: Secondary | ICD-10-CM | POA: Diagnosis not present

## 2023-09-08 DIAGNOSIS — R293 Abnormal posture: Secondary | ICD-10-CM

## 2023-09-08 DIAGNOSIS — R2681 Unsteadiness on feet: Secondary | ICD-10-CM | POA: Diagnosis not present

## 2023-09-08 NOTE — Therapy (Signed)
OUTPATIENT PHYSICAL THERAPY THORACOLUMBAR TREATMENT   Patient Name: Cassidy Garcia MRN: [id number] DOB:1946/05/25, 78 y.o., female Today's Date: 09/08/2023  END OF SESSION:  PT End of Session - 09/08/23 1502     Visit Number 12    Number of Visits 16    Date for PT Re-Evaluation 09/23/23    Authorization Type MCR A&B    Progress Note Due on Visit 20    PT Start Time 1439    PT Stop Time 1515    PT Time Calculation (min) 36 min    Activity Tolerance Patient tolerated treatment well    Behavior During Therapy [facility] for tasks assessed/performed                 Past Medical History:  Diagnosis Date   Arthritis    Asthma    Bronchitis 03/2016   Chronic kidney disease    stage 3   Colon polyps    tubular adenoma   Constipation    Depression    pt denies   Diverticulosis    GERD (gastroesophageal reflux disease)    Hemorrhoids    History of hiatal hernia    Hyperlipidemia    Hypertension    Hypothyroidism    Shortness of breath    with exertion on occasion   Sinusitis    Skin cancer    Past Surgical History:  Procedure Laterality Date   ABDOMINAL HYSTERECTOMY     BICEPT TENODESIS Right 04/26/2023   Procedure: RIGHT BICEPS TENODESIS;  Surgeon: Cammy Copa, MD;  Location: [facility] OR;  Service: Orthopedics;  Laterality: Right;   CARPOMETACARPAL (CMC) FUSION OF THUMB Bilateral    CATARACT EXTRACTION, BILATERAL     CHOLECYSTECTOMY     COLONOSCOPY  02/12/2011   HAMMER TOE SURGERY Right    LAPAROSCOPIC APPENDECTOMY N/A 08/26/2021   Procedure: APPENDECTOMY LAPAROSCOPIC WITH LYSIS OF ADHESIONS;  Surgeon: Karie Soda, MD;  Location: [facility] ORS;  Service: General;  Laterality: N/A;   NASAL SINUS SURGERY     POLYPECTOMY     REVERSE SHOULDER ARTHROPLASTY Right 04/26/2023   Procedure: RIGHT REVERSE SHOULDER ARTHROPLASTY;  Surgeon: Cammy Copa, MD;  Location: [facility] OR;  Service: Orthopedics;  Laterality: Right;   TOTAL HIP ARTHROPLASTY  12/10/2011   Procedure: TOTAL  HIP ARTHROPLASTY ANTERIOR APPROACH;  Surgeon: Kathryne Hitch, MD;  Location: [facility] ORS;  Service: Orthopedics;  Laterality: Right;  Right Total Hip Arthroplasty, Anterior Approach   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   Rotator cuff tendonitis, right 07/02/2022   Nausea 12/02/2021   Bright red rectal bleeding 09/15/2021   History of colonic polyps 07/13/2021   Gastroenteritis 09/09/2020   Anemia 03/06/2020   Hypothyroidism 02/04/2020   Depression 01/24/2020   Asthma 01/24/2020   CKD (chronic kidney disease), stage III (HCC) 01/24/2020   Acute on chronic renal failure (HCC) 01/24/2020   Constipation 12/20/2018   Pain in right hip 06/29/2017   Trochanteric bursitis, right hip 06/29/2017   History of total replacement of right hip 06/29/2017   Hemorrhoids 03/01/2017   Great toe pain, left 12/04/2016   Chronic right shoulder pain 12/04/2016   Non-toxic multinodular goiter 05/14/2013   Degenerative arthritis of hip 12/10/2011   HTN (hypertension) 07/06/2011   Hyperlipidemia 07/06/2011   Gastro-esophageal reflux disease without esophagitis 09/23/2009    PCP: Gerlene Burdock  Jacky Kindle MD  REFERRING PROVIDER: Juanda Chance, NP   REFERRING DIAG: [phone] (ICD-10-CM) - Lumbar radiculopathy   Rationale for Evaluation and Treatment: Rehabilitation  THERAPY DIAG:  Other low back pain  Unsteadiness on feet  Abnormal posture  ONSET DATE: <5 yrs  SUBJECTIVE:                                                                                                                                                                                           SUBJECTIVE STATEMENT: I was sore againb after last land appointment but not too bad.  PN subjective: Pt states she had severely increased pain the following AM after prior land based  treatment.  She performed a water walking class at the [facility] the following Tuesday and did too much.  Pt reports having much increased pain after the water class.  She later had aquatic therapy that same day.  Pt states her pain is getting worse.  Sitting is bothering her now and it wasn't bothering her prior.  Pt is having pain in bilat posterior hips.  Pt reports improved performance of getting into/out of SUV.  She states she is safe with SUV transfers.  Pt states she was improving with community ambulation distance until last week's land based therapy.  Pt thinks aquatic therapy is helping.     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 Low back and right buttock pain. 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 Yes: NPRS scale: 3/10 in sitting currently Pain location: R shoulder, R hip, across lower back Pain description:dull  Aggravating factors: standing, walking 5-10 minutes Relieving factors: meds; lidocaine patches, sitting  PRECAUTIONS: None  RED FLAGS: None   WEIGHT BEARING RESTRICTIONS: No  FALLS:  Has patient fallen in last 6 months? No  LIVING ENVIRONMENT: Lives with: lives alone   OCCUPATION:  Retired   PLOF:  Independent and Leisure: watch TV, puzzles  PATIENT GOALS: stand and walk for more than 10 minutes without cart or UE support; decrease pain.  NEXT MD VISIT: q 3-5 months  OBJECTIVE:  Note: Objective measures were completed at Evaluation unless otherwise noted.  DIAGNOSTIC FINDINGS:  6/23 Xray lumbar IMPRESSION: 1. 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 2. Multilevel neural foraminal narrowing severe on the left at L3-4 and moderate on the left at L2-3 and L4-5.      LOWER EXTREMITY strength:     Strength Right eval  Left  eval Right 1/16 Left  1/16  Hip flexion 23.6 35.5 33.7 31.7  Hip extension      Hip abduction 22.4 27.4 25.9 28.2  Hip adduction      Hip internal rotation      Hip external rotation      Knee flexion      Knee extension 29.6 23.2 46.0 32.9  Ankle dorsiflexion      Ankle plantarflexion      Ankle inversion      Ankle eversion       (Blank rows = not tested)   FUNCTIONAL TESTS:  5x STS test:  Initial/Current: 14.99 / 11.23  2 MWT:  Initial/ Current:  321 ft / 273 ft -- Pt states her legs felt weak   TODAY'S TREATMENT:   1/23 LTR x10ea SKTC 20sec x3ea  Supine TrA contraction with and without 5 sec hold x15 Posterior pelvic tilts in supine 5" x15 Supine marching with TrA 2x10 Sidelying clams 2x10bil Seated marching 2x10    09/06/23  Pt seen for aquatic therapy today.  Treatment took place in water 3.5-4.75 ft in depth at the [facility] pool. Temp of water was 91.  Pt entered/exited the pool via stairs and step to pattern with hand rail.     *walking *Attempted forward to back pendulum increased rue pain *side to side pendulum. Cues for execution, abd bracing and pacing x 10 *[place] with step back x 10 each *prone fly (modified for r shoulder) *TrA set using solid noodle pull down to thighs in wide stance then staggered x 10 *braiding x 4 widths ue support noodle * Unsupported:  LE swings into hip abdct/ addct (crossing midline) x 12, hip flex/ext x 12.  Completed in 3.30ft (good  challenge) * single leg superman x6 R/L with UE unsupported *straddling noodle: cycling breast stroke arms x 6 widths; hip add/abd; hip flex/ext *sitting balance on yellow noodle- >hands on lap->alternating hand in air hand on lap->hand elevated    Pt requires the buoyancy and hydrostatic pressure of water for support, and to offload joints by unweighting joint load by at least 50 % in navel deep water and by at least 75-80% in chest to neck deep water.  Viscosity of  the water is needed for resistance of strengthening. Water current perturbations provides challenge to standing balance requiring increased core activation.   PATIENT EDUCATION:  Education details:  objective findings, progress, goal progress, exercise form, exercise response, POC, relevant anatomy Person educated: Patient Education method: Explanation, demonstration, verbal and tactile cues Education comprehension: verbalized understanding, returned demonstration, verbal and tactile cues required  HOME EXERCISE PROGRAM: Access Code: [email or web address] URL: [email or web address] Date: 08/25/2023 Prepared by: Aaron Edelman  Exercises - Supine Transversus Abdominis Bracing - Hands on Stomach  - 2 x daily - 7 x weekly - 1-2 sets - 10 reps - Supine March  - 1 x daily - 7 x weekly - 1-2 sets - 10 reps - Supine Core Control with Heel Slide  - 1 x daily - 7 x weekly - 1 sets - 10 reps  ASSESSMENT:  CLINICAL IMPRESSION: Pt had good tolerance for gentle progressions to core and hip strengthening. Added s/l clams to HEP. Pt does report difficulty raising legs to get into vehicle, so added seated marching today. Will continue to progress with core/lumbar stabilization and hip strengthening as tolerated.    PN Pt presents to Rx reporting much increased pain after prior land based Rx.  Pt states  her pain is getting worse.  She is having pain in sitting which she did not have before.  Pt demonstrates improved functional LE strength and performance of sit/stand transfers with 5x STS test improving from 14.99 to 11.23.  Pt demonstrates improved strength in bilat LE's except L hip flexion as evidenced by HHD.  Pt has decreased distance on 2 MWT by 48 ft today.  Pt has been responding well to aquatic therapy.  PT worked on TrA contractions to improve core strength after re-assessing pt.  Though she states her pain is worse, is bothering her now and it wasn't bothering her prior.  Pt has made good  progress toward goals.  Pt has met all STG's except partially meeting #5.  Pt met LTG's #1,2 and partially #4.  Pt should benefit from cont skilled PT to address impairments and ongoing goals and to improve overall function.    OBJECTIVE IMPAIRMENTS: Abnormal gait, decreased activity tolerance, decreased balance, decreased knowledge of use of DME, decreased mobility, difficulty walking, decreased strength, improper body mechanics, postural dysfunction, obesity, and pain.   ACTIVITY LIMITATIONS: carrying, lifting, standing, transfers, and locomotion level  PARTICIPATION LIMITATIONS: meal prep, cleaning, laundry, driving, shopping, and community activity   PERSONAL FACTORS:  3+ comorbidities: OA, asthma, depression, HTN  are also affecting patient's functional outcome.   REHAB POTENTIAL: Good  CLINICAL DECISION MAKING: Evolving/moderate complexity  EVALUATION COMPLEXITY: Moderate   GOALS: Goals reviewed with patient? Yes  SHORT TERM GOALS: Target date: 08/20/23  Pt will tolerate full aquatic sessions consistently without increase in pain and with improving function to demonstrate good toleration and effectiveness of intervention.  Baseline: Goal status: Met 08/04/23  2.  Pt will tolerate walking to and from setting along with an entire aquatic session without excessive fatigue or pain To demonstrate improved toleration to activity. Baseline:  Goal status: Met 08/04/23  3.  Pt will improve on 5 X STS test to <or=  12s  to demonstrate improving functional lower extremity strength, transitional movements, and balance Baseline: 14.99 Goal status: GOAL MET  09/01/23  4.  Pt will complete tandem stance and SLS in 3.6 ft holding positions x >15 s without ue support Baseline:  Goal status:Met 08/18/23  5. Pt will improve on Tug test to <or= 13s  to demonstrate improvement in lower extremity function, mobility and decreased fall risk. Baseline: 15.22 Goal status: PARTIALLY  MET  08/30/23       LONG TERM GOALS: Target date: 09/23/23  Pt to meet stated Foto Goal Baseline: 53% 08/18/23 Goal status: Met 08/18/23  2.  Pt to report improved ability to step up into SUV safely. Baseline:  Goal status: GOAL MET  09/01/23  3.  Pt will be indep with final HEP's (land and aquatic as appropriate) for continued management of condition Baseline:  Goal status: INITIAL  4.  Pt will improve strength in bilateral hips by at least 10lbs to demonstrate improved overall physical function  Baseline:  Goal status: 25% MET  5.  Pt will improve on Berg balance test to >/= 50/56 to demonstrate a decrease in fall risk. Baseline: 32/56 Goal status: INITIAL  6.  Pt will amb community distances with or without ad without limitation to pain Baseline:  Goal status: ONGOING  09/01/23  PLAN:  PT FREQUENCY: 1-2x/week  PT DURATION:  3-4 weeks   PLANNED INTERVENTIONS: 97164- PT Re-evaluation, 97110-Therapeutic exercises, 97530- Therapeutic activity, 97112- Neuromuscular re-education, 97535- Self Care, [id number]- Manual therapy, [id number]- Gait training, [phone]- Orthotic  Fit/training, 40981- Aquatic Therapy, [phone]- Ionotophoresis 4mg /ml Dexamethasone, Patient/Family education, Balance training, Stair training, Taping, Dry Needling, Joint mobilization, DME instructions, Cryotherapy, and Moist heat.  PLAN FOR NEXT SESSION:  Pt to continue with aquatic therapy and possibly land therapy if she tolerates it well.  Aquatic: core and hip strengthening/ROM; balance retraining; posture retraining.  Pt is planning to start aquatic classes next week. Land:Gait tolerance, posture and body mechanics, strengthening and stretching; HEP  Riki Altes, PTA  09/08/23 4:52 PM [facility] [address] [place], [place], [address] Phone: [phone]   Fax:  [phone]

## 2023-09-13 ENCOUNTER — Encounter (HOSPITAL_BASED_OUTPATIENT_CLINIC_OR_DEPARTMENT_OTHER): Payer: Self-pay | Admitting: Physical Therapy

## 2023-09-13 ENCOUNTER — Ambulatory Visit (HOSPITAL_BASED_OUTPATIENT_CLINIC_OR_DEPARTMENT_OTHER): Payer: Medicare Other | Admitting: Physical Therapy

## 2023-09-13 DIAGNOSIS — R2681 Unsteadiness on feet: Secondary | ICD-10-CM | POA: Diagnosis not present

## 2023-09-13 DIAGNOSIS — R293 Abnormal posture: Secondary | ICD-10-CM | POA: Diagnosis not present

## 2023-09-13 DIAGNOSIS — M5459 Other low back pain: Secondary | ICD-10-CM | POA: Diagnosis not present

## 2023-09-13 NOTE — Therapy (Signed)
OUTPATIENT PHYSICAL THERAPY THORACOLUMBAR TREATMENT   Patient Name: Cassidy Garcia MRN: [id number] DOB:07-10-1946, 78 y.o., female Today's Date: 09/13/2023  END OF SESSION:  PT End of Session - 09/13/23 1452     Visit Number 13    Number of Visits 16    Date for PT Re-Evaluation 09/23/23    Authorization Type MCR A&B    Progress Note Due on Visit 20    PT Start Time 1445    PT Stop Time 1525    PT Time Calculation (min) 40 min    Activity Tolerance Patient tolerated treatment well    Behavior During Therapy [facility] for tasks assessed/performed                 Past Medical History:  Diagnosis Date   Arthritis    Asthma    Bronchitis 03/2016   Chronic kidney disease    stage 3   Colon polyps    tubular adenoma   Constipation    Depression    pt denies   Diverticulosis    GERD (gastroesophageal reflux disease)    Hemorrhoids    History of hiatal hernia    Hyperlipidemia    Hypertension    Hypothyroidism    Shortness of breath    with exertion on occasion   Sinusitis    Skin cancer    Past Surgical History:  Procedure Laterality Date   ABDOMINAL HYSTERECTOMY     BICEPT TENODESIS Right 04/26/2023   Procedure: RIGHT BICEPS TENODESIS;  Surgeon: Cammy Copa, MD;  Location: [facility] OR;  Service: Orthopedics;  Laterality: Right;   CARPOMETACARPAL (CMC) FUSION OF THUMB Bilateral    CATARACT EXTRACTION, BILATERAL     CHOLECYSTECTOMY     COLONOSCOPY  02/12/2011   HAMMER TOE SURGERY Right    LAPAROSCOPIC APPENDECTOMY N/A 08/26/2021   Procedure: APPENDECTOMY LAPAROSCOPIC WITH LYSIS OF ADHESIONS;  Surgeon: Karie Soda, MD;  Location: [facility] ORS;  Service: General;  Laterality: N/A;   NASAL SINUS SURGERY     POLYPECTOMY     REVERSE SHOULDER ARTHROPLASTY Right 04/26/2023   Procedure: RIGHT REVERSE SHOULDER ARTHROPLASTY;  Surgeon: Cammy Copa, MD;  Location: [facility] OR;  Service: Orthopedics;  Laterality: Right;   TOTAL HIP ARTHROPLASTY  12/10/2011   Procedure: TOTAL  HIP ARTHROPLASTY ANTERIOR APPROACH;  Surgeon: Kathryne Hitch, MD;  Location: [facility] ORS;  Service: Orthopedics;  Laterality: Right;  Right Total Hip Arthroplasty, Anterior Approach   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   Rotator cuff tendonitis, right 07/02/2022   Nausea 12/02/2021   Bright red rectal bleeding 09/15/2021   History of colonic polyps 07/13/2021   Gastroenteritis 09/09/2020   Anemia 03/06/2020   Hypothyroidism 02/04/2020   Depression 01/24/2020   Asthma 01/24/2020   CKD (chronic kidney disease), stage III (HCC) 01/24/2020   Acute on chronic renal failure (HCC) 01/24/2020   Constipation 12/20/2018   Pain in right hip 06/29/2017   Trochanteric bursitis, right hip 06/29/2017   History of total replacement of right hip 06/29/2017   Hemorrhoids 03/01/2017   Great toe pain, left 12/04/2016   Chronic right shoulder pain 12/04/2016   Non-toxic multinodular goiter 05/14/2013   Degenerative arthritis of hip 12/10/2011   HTN (hypertension) 07/06/2011   Hyperlipidemia 07/06/2011   Gastro-esophageal reflux disease without esophagitis 09/23/2009    PCP: Gerlene Burdock  Jacky Kindle MD  REFERRING PROVIDER: Juanda Chance, NP   REFERRING DIAG: [phone] (ICD-10-CM) - Lumbar radiculopathy   Rationale for Evaluation and Treatment: Rehabilitation  THERAPY DIAG:  Other low back pain  Unsteadiness on feet  Abnormal posture  ONSET DATE: <5 yrs  SUBJECTIVE:                                                                                                                                                                                           SUBJECTIVE STATEMENT: I've had a set back.  I missed a step at pool and slipped, caught myself with my R arm on rail.      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 Low back and right buttock pain. 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 Yes: NPRS scale: 3-4/10  Pain location: R shoulder, R hip, across lower back Pain description:dull  Aggravating factors: standing, walking 5-10 minutes Relieving factors: meds; lidocaine patches, sitting  PRECAUTIONS: None  RED FLAGS: None   WEIGHT BEARING RESTRICTIONS: No  FALLS:  Has patient fallen in last 6 months? No  LIVING ENVIRONMENT: Lives with: lives alone   OCCUPATION:  Retired   PLOF:  Independent and Leisure: watch TV, puzzles  PATIENT GOALS: stand and walk for more than 10 minutes without cart or UE support; decrease pain.  NEXT MD VISIT: q 3-5 months  OBJECTIVE:  Note: Objective measures were completed at Evaluation unless otherwise noted.  DIAGNOSTIC FINDINGS:  6/23 Xray lumbar IMPRESSION: 1. 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 2. Multilevel neural foraminal narrowing severe on the left at L3-4 and moderate on the left at L2-3 and L4-5.      LOWER EXTREMITY strength:     Strength Right eval Left eval Right 1/16 Left  1/16  Hip flexion 23.6 35.5 33.7 31.7  Hip extension      Hip abduction 22.4 27.4 25.9 28.2  Hip adduction      Hip internal rotation      Hip external rotation      Knee flexion      Knee extension 29.6 23.2 46.0 32.9  Ankle dorsiflexion      Ankle plantarflexion      Ankle inversion      Ankle eversion       (Blank rows = not tested)   FUNCTIONAL TESTS:  5x STS test:  Initial/Current: 14.99 / 11.23  2 MWT:  Initial/ Current:  321 ft / 273 ft -- Pt states her legs felt weak  09/13/23, TUG: 10.68sec   TODAY'S TREATMENT:  09/13/23 Pt seen for aquatic therapy today.  Treatment took place in water 3.5-4.75 ft in depth at  the [facility] pool. Temp of water was 91.  Pt entered/exited the pool via stairs and step to pattern with hand rail.  *walking forward / backward unsupported  * UE on yellow hand floats:  LE swings into hip abdct/ addct (crossing midline) x 12, hip flex/ext x 12. *Bow & Arrow with step back x 10 each * Side stepping with arm addct with rainbow hand floats -> without resistance  *braiding R/L without support * TrA set with knee taps to same side/ opp side knee while marching forward  *sitting balance on yellow noodle- >hands on lap->alternating hand in air hand on lap->hand elevated * 3 way LE lunge x 10 each, unsupported * plank to from superman with UE On rainbow hand floats x 8;  plank with hands on rainbow hand floats with alternating LE hip ext * pendulums -side to side    1/23 LTR x10ea SKTC 20sec x3ea  Supine TrA contraction with and without 5 sec hold x15 Posterior pelvic tilts in supine 5" x15 Supine marching with TrA 2x10 Sidelying clams 2x10bil Seated marching 2x10    09/06/23  Pt seen for aquatic therapy today.  Treatment took place in water 3.5-4.75 ft in depth at the [facility] pool. Temp of water was 91.  Pt entered/exited the pool via stairs and step to pattern with hand rail.   *walking *Attempted forward to back pendulum increased rue pain *side to side pendulum. Cues for execution, abd bracing and pacing x 10 *Bow & Arrow with step back x 10 each *prone fly (modified for r shoulder) *TrA set using solid noodle pull down to thighs in wide stance then staggered x 10 *braiding x 4 widths ue support noodle * Unsupported:  LE swings into hip abdct/ addct (crossing midline) x 12, hip flex/ext x 12.  Completed in 3.52ft (good  challenge) * single leg superman x6 R/L with UE unsupported *straddling noodle: cycling breast stroke arms x 6 widths; hip add/abd; hip flex/ext *sitting balance on yellow noodle- >hands on lap->alternating hand in air  hand on lap->hand elevated    Pt requires the buoyancy and hydrostatic pressure of water for support, and to offload joints by unweighting joint load by at least 50 % in navel deep water and by at least 75-80% in chest to neck deep water.  Viscosity of the water is needed for resistance of strengthening. Water current perturbations provides challenge to standing balance requiring increased core activation.   PATIENT EDUCATION:  Education details:  objective findings, progress, goal progress, exercise form, exercise response, POC, relevant anatomy Person educated: Patient Education method: Explanation, demonstration, verbal and tactile cues Education comprehension: verbalized understanding, returned demonstration, verbal and tactile cues required  HOME EXERCISE PROGRAM: Access Code: [email or web address] URL: [email or web address] Date: 08/25/2023 Prepared by: Aaron Edelman  Aquatic Access Code: [email or web address] URL: [email or web address] Date: 09/13/2023  * not issued yet. Prepared by: [facility] [address]   ASSESSMENT:  CLINICAL IMPRESSION: Pt had good tolerance for aquatic exercise, despite reported set-back last week. Pt has met STG 5 with improved TUG speed. Aquatic HEP created; will plan to review and issue next visit at pool.   Pt should benefit from cont skilled PT to address impairments and ongoing goals and to improve overall  function.   Therapist to check LTG 3-6 at upcoming visits in preparation for d/c to HEP.    OBJECTIVE IMPAIRMENTS: Abnormal gait, decreased activity tolerance, decreased balance, decreased knowledge of use of DME, decreased mobility, difficulty walking, decreased strength, improper body mechanics, postural dysfunction, obesity, and pain.   ACTIVITY LIMITATIONS: carrying, lifting, standing, transfers, and locomotion level  PARTICIPATION LIMITATIONS: meal prep, cleaning, laundry, driving, shopping, and community activity    PERSONAL FACTORS:  3+ comorbidities: OA, asthma, depression, HTN  are also affecting patient's functional outcome.   REHAB POTENTIAL: Good  CLINICAL DECISION MAKING: Evolving/moderate complexity  EVALUATION COMPLEXITY: Moderate   GOALS: Goals reviewed with patient? Yes  SHORT TERM GOALS: Target date: 08/20/23  Pt will tolerate full aquatic sessions consistently without increase in pain and with improving function to demonstrate good toleration and effectiveness of intervention.  Baseline: Goal status: Met 08/04/23  2.  Pt will tolerate walking to and from setting along with an entire aquatic session without excessive fatigue or pain To demonstrate improved toleration to activity. Baseline:  Goal status: Met 08/04/23  3.  Pt will improve on 5 X STS test to <or=  12s  to demonstrate improving functional lower extremity strength, transitional movements, and balance Baseline: 14.99 Goal status: MET  09/01/23  4.  Pt will complete tandem stance and SLS in 3.6 ft holding positions x >15 s without ue support Baseline:  Goal status:Met 08/18/23  5. Pt will improve on Tug test to <or= 13s  to demonstrate improvement in lower extremity function, mobility and decreased fall risk. Baseline: see above Goal status: MET - 09/13/23      LONG TERM GOALS: Target date: 09/23/23  Pt to meet stated Foto Goal Baseline: 53% 08/18/23 Goal status: Met 08/18/23  2.  Pt to report improved ability to step up into SUV safely. Baseline:  Goal status: MET  09/01/23  3.  Pt will be indep with final HEP's (land and aquatic as appropriate) for continued management of condition Baseline:  Goal status: INITIAL  4.  Pt will improve strength in bilateral hips by at least 10lbs to demonstrate improved overall physical function  Baseline:  Goal status: 25% MET  5.  Pt will improve on Berg balance test to >/= 50/56 to demonstrate a decrease in fall risk. Baseline: 32/56 Goal status: INITIAL  6.  Pt will amb  community distances with or without ad without limitation to pain Baseline:  Goal status: ONGOING  09/01/23  PLAN:  PT FREQUENCY: 1-2x/week  PT DURATION:  3-4 weeks   PLANNED INTERVENTIONS: 97164- PT Re-evaluation, 97110-Therapeutic exercises, 97530- Therapeutic activity, 97112- Neuromuscular re-education, 97535- Self Care, 91478- Manual therapy, [phone]- Gait training, [phone]- Orthotic Fit/training, [phone]- Aquatic Therapy, [phone]- Ionotophoresis 4mg /ml Dexamethasone, Patient/Family education, Balance training, Stair training, Taping, Dry Needling, Joint mobilization, DME instructions, Cryotherapy, and Moist heat.  PLAN FOR NEXT SESSION:  Pt to continue with aquatic therapy and possibly land therapy if she tolerates it well.  Aquatic: core and hip strengthening/ROM; balance retraining; posture retraining.  Pt is planning to start aquatic classes next week. Land:Gait tolerance, posture and body mechanics, strengthening and stretching; HEP  Mayer Camel, PTA 09/13/23 3:29 PM [facility] [address] [place], [place], [address] Phone: [phone]   Fax:  [phone]

## 2023-09-15 ENCOUNTER — Ambulatory Visit (HOSPITAL_BASED_OUTPATIENT_CLINIC_OR_DEPARTMENT_OTHER): Payer: Medicare Other | Admitting: Physical Therapy

## 2023-09-15 ENCOUNTER — Encounter (HOSPITAL_BASED_OUTPATIENT_CLINIC_OR_DEPARTMENT_OTHER): Payer: Self-pay | Admitting: Physical Therapy

## 2023-09-15 DIAGNOSIS — M5459 Other low back pain: Secondary | ICD-10-CM | POA: Diagnosis not present

## 2023-09-15 DIAGNOSIS — R2681 Unsteadiness on feet: Secondary | ICD-10-CM | POA: Diagnosis not present

## 2023-09-15 DIAGNOSIS — R293 Abnormal posture: Secondary | ICD-10-CM

## 2023-09-15 NOTE — Therapy (Signed)
OUTPATIENT PHYSICAL THERAPY THORACOLUMBAR TREATMENT   Patient Name: Cassidy Garcia MRN: [id number] DOB:[birth year], 78 y.o., female Today's Date: 09/16/2023  END OF SESSION:  PT End of Session - 09/15/23 1537     Visit Number 14    Number of Visits 16    Date for PT Re-Evaluation 09/23/23    Authorization Type MCR A&B    PT Start Time 1455    PT Stop Time 1530    PT Time Calculation (min) 35 min    Activity Tolerance Patient tolerated treatment well    Behavior During Therapy [facility] for tasks assessed/performed                  Past Medical History:  Diagnosis Date   Arthritis    Asthma    Bronchitis 03/2016   Chronic kidney disease    stage 3   Colon polyps    tubular adenoma   Constipation    Depression    pt denies   Diverticulosis    GERD (gastroesophageal reflux disease)    Hemorrhoids    History of hiatal hernia    Hyperlipidemia    Hypertension    Hypothyroidism    Shortness of breath    with exertion on occasion   Sinusitis    Skin cancer    Past Surgical History:  Procedure Laterality Date   ABDOMINAL HYSTERECTOMY     BICEPT TENODESIS Right 04/26/2023   Procedure: RIGHT BICEPS TENODESIS;  Surgeon: Cammy Copa, MD;  Location: [facility] OR;  Service: Orthopedics;  Laterality: Right;   CARPOMETACARPAL (CMC) FUSION OF THUMB Bilateral    CATARACT EXTRACTION, BILATERAL     CHOLECYSTECTOMY     COLONOSCOPY  02/12/2011   HAMMER TOE SURGERY Right    LAPAROSCOPIC APPENDECTOMY N/A 08/26/2021   Procedure: APPENDECTOMY LAPAROSCOPIC WITH LYSIS OF ADHESIONS;  Surgeon: Karie Soda, MD;  Location: [facility] ORS;  Service: General;  Laterality: N/A;   NASAL SINUS SURGERY     POLYPECTOMY     REVERSE SHOULDER ARTHROPLASTY Right 04/26/2023   Procedure: RIGHT REVERSE SHOULDER ARTHROPLASTY;  Surgeon: Cammy Copa, MD;  Location: [facility] OR;  Service: Orthopedics;  Laterality: Right;   TOTAL HIP ARTHROPLASTY  12/10/2011   Procedure: TOTAL HIP ARTHROPLASTY ANTERIOR  APPROACH;  Surgeon: Kathryne Hitch, MD;  Location: [facility] ORS;  Service: Orthopedics;  Laterality: Right;  Right Total Hip Arthroplasty, Anterior Approach   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   Rotator cuff tendonitis, right 07/02/2022   Nausea 12/02/2021   Bright red rectal bleeding 09/15/2021   History of colonic polyps 07/13/2021   Gastroenteritis 09/09/2020   Anemia 03/06/2020   Hypothyroidism 02/04/2020   Depression 01/24/2020   Asthma 01/24/2020   CKD (chronic kidney disease), stage III (HCC) 01/24/2020   Acute on chronic renal failure (HCC) 01/24/2020   Constipation 12/20/2018   Pain in right hip 06/29/2017   Trochanteric bursitis, right hip 06/29/2017   History of total replacement of right hip 06/29/2017   Hemorrhoids 03/01/2017   Great toe pain, left 12/04/2016   Chronic right shoulder pain 12/04/2016   Non-toxic multinodular goiter 05/14/2013   Degenerative arthritis of hip 12/10/2011   HTN (hypertension) 07/06/2011   Hyperlipidemia 07/06/2011   Gastro-esophageal reflux disease without esophagitis 09/23/2009    PCP: Geoffry Paradise MD  REFERRING PROVIDER: Juanda Chance,  NP   REFERRING DIAG: M54.16 (ICD-10-CM) - Lumbar radiculopathy   Rationale for Evaluation and Treatment: Rehabilitation  THERAPY DIAG:  Other low back pain  Unsteadiness on feet  Abnormal posture  ONSET DATE: <5 yrs  SUBJECTIVE:                                                                                                                                                                                           SUBJECTIVE STATEMENT: Pt states she "wrenched" her shoulder and back last week at the [facility] pool.  I misjudged the steps and missed a step at the pool falling into the water.  She was holding onto the rail when  she slipped.  Pt states she felt fine after the prior land based and aquatic Rx.  Pt reports her lumbar pain is a 7/10 when she moves and it catches.  Pt states she plans to try a new aquatic class at the [facility] which is aqua arthritis.     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 Low back and right buttock pain. 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 Yes: NPRS scale:  2/10  /  0/10  Pain location:  Lumbar / R shoulder Pain description:dull  Aggravating factors: standing, walking 5-10 minutes Relieving factors: meds; lidocaine patches, sitting  PRECAUTIONS: None  RED FLAGS: None   WEIGHT BEARING RESTRICTIONS: No  FALLS:  Has patient fallen in last 6 months? No  LIVING ENVIRONMENT: Lives with: lives alone   OCCUPATION:  Retired   PLOF:  Independent and Leisure: watch TV, puzzles  PATIENT GOALS: stand and walk for more than 10 minutes without cart or UE support; decrease pain.  NEXT MD VISIT: q 3-5 months  OBJECTIVE:  Note: Objective measures were completed at Evaluation unless otherwise noted.  DIAGNOSTIC FINDINGS:  6/23 Xray lumbar IMPRESSION: 1. 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 2. Multilevel neural foraminal narrowing severe on the left at L3-4 and moderate on the left at L2-3 and L4-5.      LOWER EXTREMITY strength:     Strength Right eval Left eval Right 1/16 Left  1/16  Hip flexion 23.6 35.5 33.7 31.7  Hip extension      Hip abduction 22.4 27.4 25.9 28.2  Hip adduction      Hip internal rotation      Hip external rotation      Knee flexion  Knee extension 29.6 23.2 46.0 32.9  Ankle dorsiflexion      Ankle plantarflexion      Ankle inversion      Ankle eversion       (Blank rows = not  tested)   FUNCTIONAL TESTS:  5x STS test:  Initial/Current: 14.99 / 11.23  2 MWT:  Initial/ Current:  321 ft / 273 ft -- Pt states her legs felt weak  09/13/23, TUG: 10.68sec   TODAY'S TREATMENT:  09/15/2023 Reviewed pt presentation, response to prior Rx, and pain level.  Supine marching with TrA x10 and x 6 reps Supine alt LE extension with TrA 2x10 S/L clams 2x10 bilat Supine PPT 2x10 LAQ 2x10 Standing marching 2x10 with bilat UE suppport Seated rows with RTB with TrA 2x10   09/13/23 Pt seen for aquatic therapy today.  Treatment took place in water 3.5-4.75 ft in depth at the [facility] pool. Temp of water was 91.  Pt entered/exited the pool via stairs and step to pattern with hand rail.  *walking forward / backward unsupported  * UE on yellow hand floats:  LE swings into hip abdct/ addct (crossing midline) x 12, hip flex/ext x 12. *Bow & Arrow with step back x 10 each * Side stepping with arm addct with rainbow hand floats -> without resistance  *braiding R/L without support * TrA set with knee taps to same side/ opp side knee while marching forward  *sitting balance on yellow noodle- >hands on lap->alternating hand in air hand on lap->hand elevated * 3 way LE lunge x 10 each, unsupported * plank to from superman with UE On rainbow hand floats x 8;  plank with hands on rainbow hand floats with alternating LE hip ext * pendulums -side to side    1/23 LTR x10ea SKTC 20sec x3ea  Supine TrA contraction with and without 5 sec hold x15 Posterior pelvic tilts in supine 5" x15 Supine marching with TrA 2x10 Sidelying clams 2x10bil Seated marching 2x10    09/06/23  Pt seen for aquatic therapy today.  Treatment took place in water 3.5-4.75 ft in depth at the [facility] pool. Temp of water was 91.  Pt entered/exited the pool via stairs and step to pattern with hand rail.   *walking *Attempted forward to back pendulum increased rue pain *side to side  pendulum. Cues for execution, abd bracing and pacing x 10 *Bow & Arrow with step back x 10 each *prone fly (modified for r shoulder) *TrA set using solid noodle pull down to thighs in wide stance then staggered x 10 *braiding x 4 widths ue support noodle * Unsupported:  LE swings into hip abdct/ addct (crossing midline) x 12, hip flex/ext x 12.  Completed in 3.39ft (good  challenge) * single leg superman x6 R/L with UE unsupported *straddling noodle: cycling breast stroke arms x 6 widths; hip add/abd; hip flex/ext *sitting balance on yellow noodle- >hands on lap->alternating hand in air hand on lap->hand elevated    Pt requires the buoyancy and hydrostatic pressure of water for support, and to offload joints by unweighting joint load by at least 50 % in navel deep water and by at least 75-80% in chest to neck deep water.  Viscosity of the water is needed for resistance of strengthening. Water current perturbations provides challenge to standing balance requiring increased core activation.   PATIENT EDUCATION:  Education details:  exercise form, exercise response, POC, relevant anatomy Person educated: Patient Education method: Explanation, demonstration, verbal and tactile  cues Education comprehension: verbalized understanding, returned demonstration, verbal and tactile cues required  HOME EXERCISE PROGRAM: Access Code: [email or web address] URL: [email or web address] Date: 08/25/2023 Prepared by: Aaron Edelman  Aquatic Access Code: [email or web address] URL: [email or web address] Date: 09/13/2023  * not issued yet. Prepared by: [facility] [address]   ASSESSMENT:  CLINICAL IMPRESSION: Pt is improving with tolerance for land based exercises.  She is able to perform increased land based exercises without c/o's.  PT provided cuing and instruction for correct form and positioning with exercises focusing on core, postural, and LE strength.  Pt is going to the [facility]  pool though states the aquatic class she was trying may be too intense.  She plans to try a new aquatic class.  She responded well to Rx stating she felt a little better after Rx.       OBJECTIVE IMPAIRMENTS: Abnormal gait, decreased activity tolerance, decreased balance, decreased knowledge of use of DME, decreased mobility, difficulty walking, decreased strength, improper body mechanics, postural dysfunction, obesity, and pain.   ACTIVITY LIMITATIONS: carrying, lifting, standing, transfers, and locomotion level  PARTICIPATION LIMITATIONS: meal prep, cleaning, laundry, driving, shopping, and community activity   PERSONAL FACTORS:  3+ comorbidities: OA, asthma, depression, HTN  are also affecting patient's functional outcome.   REHAB POTENTIAL: Good  CLINICAL DECISION MAKING: Evolving/moderate complexity  EVALUATION COMPLEXITY: Moderate   GOALS: Goals reviewed with patient? Yes  SHORT TERM GOALS: Target date: 08/20/23  Pt will tolerate full aquatic sessions consistently without increase in pain and with improving function to demonstrate good toleration and effectiveness of intervention.  Baseline: Goal status: Met 08/04/23  2.  Pt will tolerate walking to and from setting along with an entire aquatic session without excessive fatigue or pain To demonstrate improved toleration to activity. Baseline:  Goal status: Met 08/04/23  3.  Pt will improve on 5 X STS test to <or=  12s  to demonstrate improving functional lower extremity strength, transitional movements, and balance Baseline: 14.99 Goal status: MET  09/01/23  4.  Pt will complete tandem stance and SLS in 3.6 ft holding positions x >15 s without ue support Baseline:  Goal status:Met 08/18/23  5. Pt will improve on Tug test to <or= 13s  to demonstrate improvement in lower extremity function, mobility and decreased fall risk. Baseline: see above Goal status: MET - 09/13/23      LONG TERM GOALS: Target date: 09/23/23  Pt to  meet stated Foto Goal Baseline: 53% 08/18/23 Goal status: Met 08/18/23  2.  Pt to report improved ability to step up into SUV safely. Baseline:  Goal status: MET  09/01/23  3.  Pt will be indep with final HEP's (land and aquatic as appropriate) for continued management of condition Baseline:  Goal status: INITIAL  4.  Pt will improve strength in bilateral hips by at least 10lbs to demonstrate improved overall physical function  Baseline:  Goal status: 25% MET  5.  Pt will improve on Berg balance test to >/= 50/56 to demonstrate a decrease in fall risk. Baseline: 32/56 Goal status: INITIAL  6.  Pt will amb community distances with or without ad without limitation to pain Baseline:  Goal status: ONGOING  09/01/23  PLAN:  PT FREQUENCY: 1-2x/week  PT DURATION:  3-4 weeks   PLANNED INTERVENTIONS: 97164- PT Re-evaluation, 97110-Therapeutic exercises, 97530- Therapeutic activity, 97112- Neuromuscular re-education, 97535- Self Care, 16109- Manual therapy, [phone]- Gait training, [phone]- Orthotic Fit/training, [phone]- Aquatic Therapy, [phone]- Ionotophoresis 4mg /ml Dexamethasone, Patient/Family education, Balance training,  Stair training, Taping, Dry Needling, Joint mobilization, DME instructions, Cryotherapy, and Moist heat.  PLAN FOR NEXT SESSION:  Pt to continue with aquatic therapy and possibly land therapy if she tolerates it well.  Aquatic: core and hip strengthening/ROM; balance retraining; posture retraining.  Pt is planning to start aquatic classes next week. Land:Gait tolerance, posture and body mechanics, strengthening and stretching; HEP  Audie Clear III PT, DPT 09/16/23 1:32 PM

## 2023-09-20 ENCOUNTER — Encounter (HOSPITAL_BASED_OUTPATIENT_CLINIC_OR_DEPARTMENT_OTHER): Payer: Self-pay | Admitting: Physical Therapy

## 2023-09-20 ENCOUNTER — Ambulatory Visit (HOSPITAL_BASED_OUTPATIENT_CLINIC_OR_DEPARTMENT_OTHER): Payer: Medicare Other | Attending: Physical Medicine and Rehabilitation | Admitting: Physical Therapy

## 2023-09-20 DIAGNOSIS — R293 Abnormal posture: Secondary | ICD-10-CM | POA: Insufficient documentation

## 2023-09-20 DIAGNOSIS — R2681 Unsteadiness on feet: Secondary | ICD-10-CM | POA: Diagnosis not present

## 2023-09-20 DIAGNOSIS — M5459 Other low back pain: Secondary | ICD-10-CM | POA: Insufficient documentation

## 2023-09-20 NOTE — Therapy (Signed)
 OUTPATIENT PHYSICAL THERAPY THORACOLUMBAR TREATMENT   Patient Name: Cassidy Garcia MRN: [id number] DOB:1945-11-29, 78 y.o., female Today's Date: 09/20/2023  END OF SESSION:  PT End of Session - 09/20/23 1444     Visit Number 15    Number of Visits 16    Date for PT Re-Evaluation 09/23/23    Authorization Type MCR A&B    PT Start Time 1340    PT Stop Time 1420    PT Time Calculation (min) 40 min    Activity Tolerance Patient tolerated treatment well    Behavior During Therapy [facility] for tasks assessed/performed                  Past Medical History:  Diagnosis Date   Arthritis    Asthma    Bronchitis 03/2016   Chronic kidney disease    stage 3   Colon polyps    tubular adenoma   Constipation    Depression    pt denies   Diverticulosis    GERD (gastroesophageal reflux disease)    Hemorrhoids    History of hiatal hernia    Hyperlipidemia    Hypertension    Hypothyroidism    Shortness of breath    with exertion on occasion   Sinusitis    Skin cancer    Past Surgical History:  Procedure Laterality Date   ABDOMINAL HYSTERECTOMY     BICEPT TENODESIS Right 04/26/2023   Procedure: RIGHT BICEPS TENODESIS;  Surgeon: Addie Cordella Hamilton, MD;  Location: [facility] OR;  Service: Orthopedics;  Laterality: Right;   CARPOMETACARPAL (CMC) FUSION OF THUMB Bilateral    CATARACT EXTRACTION, BILATERAL     CHOLECYSTECTOMY     COLONOSCOPY  02/12/2011   HAMMER TOE SURGERY Right    LAPAROSCOPIC APPENDECTOMY N/A 08/26/2021   Procedure: APPENDECTOMY LAPAROSCOPIC WITH LYSIS OF ADHESIONS;  Surgeon: Sheldon Standing, MD;  Location: [facility] ORS;  Service: General;  Laterality: N/A;   NASAL SINUS SURGERY     POLYPECTOMY     REVERSE SHOULDER ARTHROPLASTY Right 04/26/2023   Procedure: RIGHT REVERSE SHOULDER ARTHROPLASTY;  Surgeon: Addie Cordella Hamilton, MD;  Location: [facility] OR;  Service: Orthopedics;  Laterality: Right;   TOTAL HIP ARTHROPLASTY  12/10/2011   Procedure: TOTAL HIP ARTHROPLASTY ANTERIOR APPROACH;   Surgeon: Lonni CINDERELLA Poli, MD;  Location: [facility] ORS;  Service: Orthopedics;  Laterality: Right;  Right Total Hip Arthroplasty, Anterior Approach   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   Rotator cuff tendonitis, right 07/02/2022   Nausea 12/02/2021   Bright red rectal bleeding 09/15/2021   History of colonic polyps 07/13/2021   Gastroenteritis 09/09/2020   Anemia 03/06/2020   Hypothyroidism 02/04/2020   Depression 01/24/2020   Asthma 01/24/2020   CKD (chronic kidney disease), stage III (HCC) 01/24/2020   Acute on chronic renal failure (HCC) 01/24/2020   Constipation 12/20/2018   Pain in right hip 06/29/2017   Trochanteric bursitis, right hip 06/29/2017   History of total replacement of right hip 06/29/2017   Hemorrhoids 03/01/2017   Great toe pain, left 12/04/2016   Chronic right shoulder pain 12/04/2016   Non-toxic multinodular goiter 05/14/2013   Degenerative arthritis of hip 12/10/2011   HTN (hypertension) 07/06/2011   Hyperlipidemia 07/06/2011   Gastro-esophageal reflux disease without esophagitis 09/23/2009    PCP: Charlie Love MD  REFERRING PROVIDER: Williams, Megan E,  NP   REFERRING DIAG: M54.16 (ICD-10-CM) - Lumbar radiculopathy   Rationale for Evaluation and Treatment: Rehabilitation  THERAPY DIAG:  Other low back pain  Unsteadiness on feet  Abnormal posture  ONSET DATE: <5 yrs  SUBJECTIVE:                                                                                                                                                                                           SUBJECTIVE STATEMENT: Pt states shoulder is better but still sore.  She completed the arthritis aquatic class at [facility] and tolerated well.    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 Low back and right buttock pain. 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 Yes: NPRS scale:  back 4/10  /  0/10         Right shoulder 0/10 Pain location:  Lumbar / R shoulder Pain description:dull  Aggravating factors: standing, walking 5-10 minutes Relieving factors: meds; lidocaine  patches, sitting  PRECAUTIONS: None  RED FLAGS: None   WEIGHT BEARING RESTRICTIONS: No  FALLS:  Has patient fallen in last 6 months? No  LIVING ENVIRONMENT: Lives with: lives alone   OCCUPATION:  Retired   PLOF:  Independent and Leisure: watch TV, puzzles  PATIENT GOALS: stand and walk for more than 10 minutes without cart or UE support; decrease pain.  NEXT MD VISIT: q 3-5 months  OBJECTIVE:  Note: Objective measures were completed at Evaluation unless otherwise noted.  DIAGNOSTIC FINDINGS:  6/23 Xray lumbar IMPRESSION: 1. 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 2. Multilevel neural foraminal narrowing severe on the left at L3-4 and moderate on the left at L2-3 and L4-5.      LOWER EXTREMITY strength:     Strength Right eval Left eval Right 1/16 Left  1/16  Hip flexion 23.6 35.5 33.7 31.7  Hip extension      Hip abduction 22.4 27.4 25.9 28.2  Hip adduction      Hip internal rotation      Hip external rotation      Knee flexion      Knee extension 29.6 23.2 46.0 32.9  Ankle dorsiflexion      Ankle plantarflexion      Ankle inversion      Ankle eversion       (Blank rows = not tested)   FUNCTIONAL TESTS:  5x STS test:  Initial/Current: 14.99 / 11.23  2 MWT:  Initial/ Current:  321 ft / 273 ft -- Pt states her legs felt weak  09/13/23, TUG: 10.68sec   TODAY'S TREATMENT:  09/20/23  Pt seen for aquatic therapy today.  Treatment took place in water  3.5-4.75  ft in depth at the [facility] pool. Temp of water  was 91.  Pt entered/exited the pool via stairs and step to pattern with hand rail.  Exercises - walking forward/ backward    - Side Stepping   - Single Leg Stance with 3-Way Kick  - Bow and Arrow with Step Back, with Hand Floats    - Forward March with Opposite Arm Knee Taps   - Sitting Balance with Arm Raise on Pool Noodle  - 3-Way Lunge   - Plank on Long Hand Float with Leg Lift   - Plank on Long Psychologist, Sport And Exercise with Arm Lifts   - Side to Side Pendulum Swing with Foam Dumbbells and Ankle Floats    Pt requires the buoyancy and hydrostatic pressure of water  for support, and to offload joints by unweighting joint load by at least 50 % in navel deep water  and by at least 75-80% in chest to neck deep water .  Viscosity of the water  is needed for resistance of strengthening. Water  current perturbations provides challenge to standing balance requiring increased core activation.  09/15/2023 Reviewed pt presentation, response to prior Rx, and pain level.  Supine marching with TrA x10 and x 6 reps Supine alt LE extension with TrA 2x10 S/L clams 2x10 bilat Supine PPT 2x10 LAQ 2x10 Standing marching 2x10 with bilat UE suppport Seated rows with RTB with TrA 2x10   09/13/23 Pt seen for aquatic therapy today.  Treatment took place in water  3.5-4.75 ft in depth at the [facility] pool. Temp of water  was 91.  Pt entered/exited the pool via stairs and step to pattern with hand rail.  *walking forward / backward unsupported  * UE on yellow hand floats:  LE swings into hip abdct/ addct (crossing midline) x 12, hip flex/ext x 12. *Bow & Arrow with step back x 10 each * Side stepping with arm addct with rainbow hand floats -> without resistance  *braiding R/L without support * TrA set with knee taps to same side/ opp side knee while marching forward  *sitting balance on yellow noodle- >hands on lap->alternating hand in air hand on  lap->hand elevated * 3 way LE lunge x 10 each, unsupported * plank to from superman with UE On rainbow hand floats x 8;  plank with hands on rainbow hand floats with alternating LE hip ext * pendulums -side to side    1/23 LTR x10ea SKTC 20sec x3ea  Supine TrA contraction with and without 5 sec hold x15 Posterior pelvic tilts in supine 5 x15 Supine marching with TrA 2x10 Sidelying clams 2x10bil Seated marching 2x10    PATIENT EDUCATION:  Education details:  exercise form, exercise response, POC, relevant anatomy Person educated: Patient Education method: Explanation, demonstration, verbal and tactile cues Education comprehension: verbalized understanding, returned demonstration, verbal and tactile cues required  HOME EXERCISE PROGRAM: Access Code: [email or web address] URL: [email or web address] Date: 08/25/2023 Prepared by: Mose Minerva  Aquatic Access Code: [email or web address] URL: [email or web address] Date: 09/13/2023  * issued Prepared by: [facility] - [address]   ASSESSMENT:  CLINICAL IMPRESSION: Pt instructed in and issued final aquatic HEP.  She completes with minor vc demonstrating good knowledge and indep with program.  She reports participation in aquatic programs at the [facility] 2 days a  week as well as compliance with land based exercises assigned  by land based therapist.  Pt reports functional amb distances in grocery stores pushing cart.  She reports limited use of cane although is encouraged to use as I do believe she would have better toleration to amb longer distances.  She VU.  She has reached her max potential in aquatic therapy and has met most goals.  Plan to adjust and re-instruct on gait using cane next/last land visit.  Pt will bring with her. Complete functional testing. Anticipate DC from services after next visit.      OBJECTIVE IMPAIRMENTS: Abnormal gait, decreased activity tolerance, decreased balance, decreased  knowledge of use of DME, decreased mobility, difficulty walking, decreased strength, improper body mechanics, postural dysfunction, obesity, and pain.   ACTIVITY LIMITATIONS: carrying, lifting, standing, transfers, and locomotion level  PARTICIPATION LIMITATIONS: meal prep, cleaning, laundry, driving, shopping, and community activity   PERSONAL FACTORS:  3+ comorbidities: OA, asthma, depression, HTN  are also affecting patient's functional outcome.   REHAB POTENTIAL: Good  CLINICAL DECISION MAKING: Evolving/moderate complexity  EVALUATION COMPLEXITY: Moderate   GOALS: Goals reviewed with patient? Yes  SHORT TERM GOALS: Target date: 08/20/23  Pt will tolerate full aquatic sessions consistently without increase in pain and with improving function to demonstrate good toleration and effectiveness of intervention.  Baseline: Goal status: Met 08/04/23  2.  Pt will tolerate walking to and from setting along with an entire aquatic session without excessive fatigue or pain To demonstrate improved toleration to activity. Baseline:  Goal status: Met 08/04/23  3.  Pt will improve on 5 X STS test to <or=  12s  to demonstrate improving functional lower extremity strength, transitional movements, and balance Baseline: 14.99 Goal status: MET  09/01/23  4.  Pt will complete tandem stance and SLS in 3.6 ft holding positions x >15 s without ue support Baseline:  Goal status:Met 08/18/23  5. Pt will improve on Tug test to <or= 13s  to demonstrate improvement in lower extremity function, mobility and decreased fall risk. Baseline: see above Goal status: MET - 09/13/23      LONG TERM GOALS: Target date: 09/23/23  Pt to meet stated Foto Goal Baseline: 53% 08/18/23 Goal status: Met 08/18/23  2.  Pt to report improved ability to step up into SUV safely. Baseline:  Goal status: MET  09/01/23  3.  Pt will be indep with final HEP's (land and aquatic as appropriate) for continued management of  condition Baseline:  Goal status: Partially met (aquatic) 09/20/23  4.  Pt will improve strength in bilateral hips by at least 10lbs to demonstrate improved overall physical function  Baseline:  Goal status: 25% MET  5.  Pt will improve on Berg balance test to >/= 50/56 to demonstrate a decrease in fall risk. Baseline: 32/56 Goal status: INITIAL  6.  Pt will amb community distances with or without ad without limitation to pain Baseline:  Goal status: Mer 09/20/23  PLAN:  PT FREQUENCY: 1-2x/week  PT DURATION:  3-4 weeks   PLANNED INTERVENTIONS: 97164- PT Re-evaluation, 97110-Therapeutic exercises, 97530- Therapeutic activity, 97112- Neuromuscular re-education, 97535- Self Care, [id number]- Manual therapy, [phone]- Gait training, [phone]- Orthotic Fit/training, [phone]- Aquatic Therapy, [phone]- Ionotophoresis 4mg /ml Dexamethasone , Patient/Family education, Balance training, Stair training, Taping, Dry Needling, Joint mobilization, DME instructions, Cryotherapy, and Moist heat.  PLAN FOR NEXT SESSION:  Pt to continue with aquatic therapy and possibly land therapy if she tolerates it well.  Aquatic: core and hip strengthening/ROM; balance  retraining; posture retraining.  Pt is planning to start aquatic classes next week. Land:Gait tolerance, posture and body mechanics, strengthening and stretching; HEP  Ronal Foots) Lavonte Palos MPT 09/20/23 3:29 PM [facility] [address] [place], [place], [address] Phone: [phone]   Fax:  [phone]

## 2023-09-22 ENCOUNTER — Ambulatory Visit (HOSPITAL_BASED_OUTPATIENT_CLINIC_OR_DEPARTMENT_OTHER): Payer: Medicare Other | Admitting: Physical Therapy

## 2023-09-22 DIAGNOSIS — M5459 Other low back pain: Secondary | ICD-10-CM

## 2023-09-22 DIAGNOSIS — R2681 Unsteadiness on feet: Secondary | ICD-10-CM

## 2023-09-22 DIAGNOSIS — R293 Abnormal posture: Secondary | ICD-10-CM

## 2023-09-22 NOTE — Therapy (Signed)
 OUTPATIENT PHYSICAL THERAPY THORACOLUMBAR TREATMENT   Patient Name: Cassidy Garcia MRN: [id number] DOB:1945-11-07, 78 y.o., female Today's Date: 09/23/2023  END OF SESSION:  PT End of Session - 09/22/23 1545     Visit Number 16    Number of Visits 16    Date for PT Re-Evaluation 09/23/23    Authorization Type MCR A&B    PT Start Time 1448    PT Stop Time 1533    PT Time Calculation (min) 45 min    Activity Tolerance Patient tolerated treatment well    Behavior During Therapy [facility] for tasks assessed/performed                   Past Medical History:  Diagnosis Date   Arthritis    Asthma    Bronchitis 03/2016   Chronic kidney disease    stage 3   Colon polyps    tubular adenoma   Constipation    Depression    pt denies   Diverticulosis    GERD (gastroesophageal reflux disease)    Hemorrhoids    History of hiatal hernia    Hyperlipidemia    Hypertension    Hypothyroidism    Shortness of breath    with exertion on occasion   Sinusitis    Skin cancer    Past Surgical History:  Procedure Laterality Date   ABDOMINAL HYSTERECTOMY     BICEPT TENODESIS Right 04/26/2023   Procedure: RIGHT BICEPS TENODESIS;  Surgeon: Addie Cordella Hamilton, MD;  Location: [facility] OR;  Service: Orthopedics;  Laterality: Right;   CARPOMETACARPAL (CMC) FUSION OF THUMB Bilateral    CATARACT EXTRACTION, BILATERAL     CHOLECYSTECTOMY     COLONOSCOPY  02/12/2011   HAMMER TOE SURGERY Right    LAPAROSCOPIC APPENDECTOMY N/A 08/26/2021   Procedure: APPENDECTOMY LAPAROSCOPIC WITH LYSIS OF ADHESIONS;  Surgeon: Sheldon Standing, MD;  Location: [facility] ORS;  Service: General;  Laterality: N/A;   NASAL SINUS SURGERY     POLYPECTOMY     REVERSE SHOULDER ARTHROPLASTY Right 04/26/2023   Procedure: RIGHT REVERSE SHOULDER ARTHROPLASTY;  Surgeon: Addie Cordella Hamilton, MD;  Location: [facility] OR;  Service: Orthopedics;  Laterality: Right;   TOTAL HIP ARTHROPLASTY  12/10/2011   Procedure: TOTAL HIP ARTHROPLASTY ANTERIOR  APPROACH;  Surgeon: Lonni CINDERELLA Poli, MD;  Location: [facility] ORS;  Service: Orthopedics;  Laterality: Right;  Right Total Hip Arthroplasty, Anterior Approach   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   Rotator cuff tendonitis, right 07/02/2022   Nausea 12/02/2021   Bright red rectal bleeding 09/15/2021   History of colonic polyps 07/13/2021   Gastroenteritis 09/09/2020   Anemia 03/06/2020   Hypothyroidism 02/04/2020   Depression 01/24/2020   Asthma 01/24/2020   CKD (chronic kidney disease), stage III (HCC) 01/24/2020   Acute on chronic renal failure (HCC) 01/24/2020   Constipation 12/20/2018   Pain in right hip 06/29/2017   Trochanteric bursitis, right hip 06/29/2017   History of total replacement of right hip 06/29/2017   Hemorrhoids 03/01/2017   Great toe pain, left 12/04/2016   Chronic right shoulder pain 12/04/2016   Non-toxic multinodular goiter 05/14/2013   Degenerative arthritis of hip 12/10/2011   HTN (hypertension) 07/06/2011   Hyperlipidemia 07/06/2011   Gastro-esophageal reflux disease without esophagitis 09/23/2009    PCP: Charlie Love MD  REFERRING PROVIDER: Trudy Bouchard  E, NP   REFERRING DIAG: M54.16 (ICD-10-CM) - Lumbar radiculopathy   Rationale for Evaluation and Treatment: Rehabilitation  THERAPY DIAG:  Other low back pain  Unsteadiness on feet  Abnormal posture  ONSET DATE: <5 yrs  SUBJECTIVE:                                                                                                                                                                                           SUBJECTIVE STATEMENT: Pt reports she continues to have lumbar pain if she sits or stands too long (10 mins).  Pt states her worst pain is if she sits or stands too long.  Pt states she has no pain with sitting.   Pt states she is able to ambulate community distance without limitation to pain.   She performed a new aquatic exercise class and tolerated it well.  She has her aquatic program handout and feels good with aquatic exercises.    She gets twinges in her R shoulder since her slip in the pool.     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 Low back and right buttock pain. 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 Yes: NPRS scale:  3/10 current, 8/10 worst, 0/10 best Pain location:  bilat sides of lumbar and central lumbar Pain description:dull  Aggravating factors: standing, walking 5-10 minutes Relieving factors: meds; lidocaine  patches, sitting  PRECAUTIONS: None  RED FLAGS: None   WEIGHT BEARING RESTRICTIONS: No  FALLS:  Has patient fallen in last 6 months? No  LIVING ENVIRONMENT: Lives with: lives alone   OCCUPATION:  Retired   PLOF:  Independent and Leisure: watch TV, puzzles  PATIENT GOALS: stand and walk for more than 10 minutes without cart or UE support; decrease pain.  NEXT MD VISIT: q 3-5 months  OBJECTIVE:  Note: Objective measures were completed at Evaluation unless otherwise noted.  DIAGNOSTIC FINDINGS:  6/23 Xray lumbar IMPRESSION: 1. 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 2. Multilevel neural foraminal narrowing severe on the left at L3-4 and moderate on the left at L2-3 and L4-5.     TODAY'S TREATMENT:   LOWER EXTREMITY strength:     Strength Right eval Left eval Right 1/16 Left  1/16 Right 2/6 Left 2/6  Hip flexion 23.6 35.5 33.7 31.7 35.9 30.2  Hip extension        Hip abduction 22.4 27.4 25.9 28.2 27.2 29.9  Hip adduction  Hip internal rotation        Hip external rotation        Knee flexion         Knee extension 29.6 23.2 46.0 32.9    Ankle dorsiflexion        Ankle plantarflexion        Ankle inversion        Ankle eversion         (Blank rows = not tested)      Reviewed pt presentation, response to prior Rx, and pain level.  Supine alt LE extension with TrA 2x10 Supine PPT 2x10 LAQ 2x10 seated at EOT, 1x10 seated in chair Seated rows with RTB with TrA x10 and in standing 2x10  Reviewed HEP.  Updated HEP and gave pt a HEP handout.  Educated pt in correct form and appropriate frequency.  PT instructed pt she should not have pain with HEP.  FOTO:  Prior / Current:  53 / 48.  Goal of 52  Pt had questions concerning usage of her SPC.  PT answered pt's concerning using her SPC.  PT instructed pt in correct sequencing of cane and appropriate height.    PATIENT EDUCATION:  Education details:  exercise form, exercise response, POC/discharge planning, HEP, relevant anatomy Person educated: Patient Education method: Explanation, demonstration, verbal and tactile cues, handout Education comprehension: verbalized understanding, returned demonstration, verbal and tactile cues required  HOME EXERCISE PROGRAM: Access Code: [email or web address] URL: [email or web address] Date: 08/25/2023 Prepared by: Mose Minerva  Updated HEP: - Seated Long Arc Quad  - 1 x daily - 5-6 x weekly - 2 sets - 10 reps - Supine Core Control with Leg Extension  - 1 x daily - 7 x weekly - 1-2 sets - 10 reps - Standing Shoulder Row with Anchored Resistance  - 1 x daily - 3-4 x weekly - 2 sets - 10 reps  Aquatic Access Code: [email or web address] URL: [email or web address] Date: 09/13/2023  * not issued yet. Prepared by: [facility] - [address]   ASSESSMENT:  CLINICAL IMPRESSION: Pt has made good progress in PT.  Pt states she is able to ambulate community distance without limitation to pain.   Pt continues to have lumbar pain if she sits or stands too long (10 mins).  Pt is  independent with aquatic exercise program and has began a new aquatic class that she tolerates well.  Pt has improved tolerance with land based exercises.  PT reviewed and updated HEP.  Pt performed exercises well with good tolerance.  She demonstrates good understanding of HEP and is independent with land based HEP.  Pt demonstrates improved strength in bilat LE's except worse strength in L hip flexion.  Pt had met her FOTO goal though her FOTO score worsened from prior testing.  She has not met her FOTO goal now.  Pt has met all STG's and LTG's 2,3,6.      OBJECTIVE IMPAIRMENTS: Abnormal gait, decreased activity tolerance, decreased balance, decreased knowledge of use of DME, decreased mobility, difficulty walking, decreased strength, improper body mechanics, postural dysfunction, obesity, and pain.   ACTIVITY LIMITATIONS: carrying, lifting, standing, transfers, and locomotion level  PARTICIPATION LIMITATIONS: meal prep, cleaning, laundry, driving, shopping, and community activity   PERSONAL FACTORS:  3+ comorbidities: OA, asthma, depression, HTN  are also affecting patient's functional outcome.   REHAB POTENTIAL: Good  CLINICAL DECISION MAKING: Evolving/moderate complexity  EVALUATION COMPLEXITY: Moderate   GOALS: Goals reviewed with patient? Yes  SHORT TERM GOALS: Target  date: 08/20/23  Pt will tolerate full aquatic sessions consistently without increase in pain and with improving function to demonstrate good toleration and effectiveness of intervention.  Baseline: Goal status: Met 08/04/23  2.  Pt will tolerate walking to and from setting along with an entire aquatic session without excessive fatigue or pain To demonstrate improved toleration to activity. Baseline:  Goal status: Met 08/04/23  3.  Pt will improve on 5 X STS test to <or=  12s  to demonstrate improving functional lower extremity strength, transitional movements, and balance Baseline: 14.99 Goal status: MET   09/01/23  4.  Pt will complete tandem stance and SLS in 3.6 ft holding positions x >15 s without ue support Baseline:  Goal status:Met 08/18/23  5. Pt will improve on Tug test to <or= 13s  to demonstrate improvement in lower extremity function, mobility and decreased fall risk. Baseline: see above Goal status: MET - 09/13/23      LONG TERM GOALS: Target date: 09/23/23  Pt to meet stated Foto Goal Baseline: Goal status: Not Met 09/22/23  2.  Pt to report improved ability to step up into SUV safely. Baseline:  Goal status: MET  09/01/23  3.  Pt will be indep with final HEP's (land and aquatic as appropriate) for continued management of condition Baseline:  Goal status: GOAL MET  09/22/2023  4.  Pt will improve strength in bilateral hips by at least 10lbs to demonstrate improved overall physical function  Baseline:  Goal status: 25% MET  5.  Pt will improve on Berg balance test to >/= 50/56 to demonstrate a decrease in fall risk. Baseline: 32/56 Goal status: NOT ASSESSED  6.  Pt will amb community distances with or without ad without limitation to pain Baseline:  Goal status:  GOAL MET  09/22/2023  PLAN:   PLANNED INTERVENTIONS: 97164- PT Re-evaluation, 97110-Therapeutic exercises, 97530- Therapeutic activity, 97112- Neuromuscular re-education, 97535- Self Care, [id number]- Manual therapy, [phone]- Gait training, [phone]- Orthotic Fit/training, [phone]- Aquatic Therapy, [phone]- Ionotophoresis 4mg /ml Dexamethasone , Patient/Family education, Balance training, Stair training, Taping, Dry Needling, Joint mobilization, DME instructions, Cryotherapy, and Moist heat.  PLAN FOR NEXT SESSION:  Pt to be discharged from skilled PT due to good progress toward goals and being pleased with current functional level.  She is agreeable with discharge.  She will cont with land based HEP and aquatic program.     PHYSICAL THERAPY DISCHARGE SUMMARY  Visits from Start of Care: 16  Current functional level related to  goals / functional outcomes: See above   Remaining deficits: See above   Education / Equipment: HEP     Leigh Minerva III PT, DPT 09/23/23 4:20 PM

## 2023-09-23 ENCOUNTER — Encounter (HOSPITAL_BASED_OUTPATIENT_CLINIC_OR_DEPARTMENT_OTHER): Payer: Self-pay | Admitting: Physical Therapy

## 2023-10-07 DIAGNOSIS — N1832 Chronic kidney disease, stage 3b: Secondary | ICD-10-CM | POA: Diagnosis not present

## 2023-10-07 DIAGNOSIS — N390 Urinary tract infection, site not specified: Secondary | ICD-10-CM | POA: Diagnosis not present

## 2023-10-10 DIAGNOSIS — I129 Hypertensive chronic kidney disease with stage 1 through stage 4 chronic kidney disease, or unspecified chronic kidney disease: Secondary | ICD-10-CM | POA: Diagnosis not present

## 2023-10-10 DIAGNOSIS — N1832 Chronic kidney disease, stage 3b: Secondary | ICD-10-CM | POA: Diagnosis not present

## 2023-10-10 DIAGNOSIS — N189 Chronic kidney disease, unspecified: Secondary | ICD-10-CM | POA: Diagnosis not present

## 2023-10-10 DIAGNOSIS — N2581 Secondary hyperparathyroidism of renal origin: Secondary | ICD-10-CM | POA: Diagnosis not present

## 2023-10-10 DIAGNOSIS — D631 Anemia in chronic kidney disease: Secondary | ICD-10-CM | POA: Diagnosis not present

## 2023-12-05 DIAGNOSIS — Z961 Presence of intraocular lens: Secondary | ICD-10-CM | POA: Diagnosis not present

## 2023-12-05 DIAGNOSIS — H524 Presbyopia: Secondary | ICD-10-CM | POA: Diagnosis not present

## 2023-12-05 DIAGNOSIS — H52203 Unspecified astigmatism, bilateral: Secondary | ICD-10-CM | POA: Diagnosis not present

## 2023-12-05 DIAGNOSIS — H04123 Dry eye syndrome of bilateral lacrimal glands: Secondary | ICD-10-CM | POA: Diagnosis not present

## 2023-12-08 DIAGNOSIS — D485 Neoplasm of uncertain behavior of skin: Secondary | ICD-10-CM | POA: Diagnosis not present

## 2023-12-08 DIAGNOSIS — L821 Other seborrheic keratosis: Secondary | ICD-10-CM | POA: Diagnosis not present

## 2023-12-08 DIAGNOSIS — Z85828 Personal history of other malignant neoplasm of skin: Secondary | ICD-10-CM | POA: Diagnosis not present

## 2023-12-08 DIAGNOSIS — L570 Actinic keratosis: Secondary | ICD-10-CM | POA: Diagnosis not present

## 2023-12-08 DIAGNOSIS — C44329 Squamous cell carcinoma of skin of other parts of face: Secondary | ICD-10-CM | POA: Diagnosis not present

## 2023-12-09 ENCOUNTER — Encounter (HOSPITAL_BASED_OUTPATIENT_CLINIC_OR_DEPARTMENT_OTHER): Payer: Self-pay | Admitting: Student

## 2023-12-09 ENCOUNTER — Ambulatory Visit (HOSPITAL_BASED_OUTPATIENT_CLINIC_OR_DEPARTMENT_OTHER)

## 2023-12-09 ENCOUNTER — Telehealth

## 2023-12-09 ENCOUNTER — Ambulatory Visit (HOSPITAL_BASED_OUTPATIENT_CLINIC_OR_DEPARTMENT_OTHER): Admitting: Student

## 2023-12-09 DIAGNOSIS — M549 Dorsalgia, unspecified: Secondary | ICD-10-CM | POA: Diagnosis not present

## 2023-12-09 DIAGNOSIS — M25551 Pain in right hip: Secondary | ICD-10-CM | POA: Diagnosis not present

## 2023-12-09 DIAGNOSIS — M160 Bilateral primary osteoarthritis of hip: Secondary | ICD-10-CM | POA: Diagnosis not present

## 2023-12-09 DIAGNOSIS — M47816 Spondylosis without myelopathy or radiculopathy, lumbar region: Secondary | ICD-10-CM | POA: Diagnosis not present

## 2023-12-09 DIAGNOSIS — Z96641 Presence of right artificial hip joint: Secondary | ICD-10-CM | POA: Diagnosis not present

## 2023-12-09 DIAGNOSIS — M5416 Radiculopathy, lumbar region: Secondary | ICD-10-CM

## 2023-12-09 DIAGNOSIS — M419 Scoliosis, unspecified: Secondary | ICD-10-CM | POA: Diagnosis not present

## 2023-12-09 DIAGNOSIS — M4316 Spondylolisthesis, lumbar region: Secondary | ICD-10-CM | POA: Diagnosis not present

## 2023-12-09 NOTE — Progress Notes (Signed)
 Chief Complaint: Right hip and leg pain    Discussed the use of AI scribe software for clinical note transcription with the patient, who gave verbal consent to proceed.  History of Present Illness The patient, with a history of right total hip arthroplasty in 2013 with [name], presents with persistent pain in the hip and back region. The patient reports the pain has been more persistent in the last couple of months, particularly when putting weight on the hip. The pain is described as being located in the groin area, the back of the hip, and occasionally in the buttock.  She does note some tingling going down into the lower leg.  The patient also reports having had several falls recently, with the most recent one involving a twist and impact, but did not result in any immediate pain. The patient has been managing the pain with ibuprofen and participating in water  exercises, which reportedly alleviate the pain. The patient has a history of receiving ESIs for back pain, with the first one providing significant relief, and subsequent ones providing some relief. The patient also uses lidocaine  patches daily for pain management.   Surgical History:   Right THA 12/10/2011  PMH/PSH/Family History/Social History/Meds/Allergies:    Past Medical History:  Diagnosis Date   Arthritis    Asthma    Bronchitis 03/2016   Chronic kidney disease    stage 3   Colon polyps    tubular adenoma   Constipation    Depression    pt denies   Diverticulosis    GERD (gastroesophageal reflux disease)    Hemorrhoids    History of hiatal hernia    Hyperlipidemia    Hypertension    Hypothyroidism    Shortness of breath    with exertion on occasion   Sinusitis    Skin cancer    Past Surgical History:  Procedure Laterality Date   ABDOMINAL HYSTERECTOMY     BICEPT TENODESIS Right 04/26/2023   Procedure: RIGHT BICEPS TENODESIS;  Surgeon: Jasmine Mesi, MD;   Location: [facility] OR;  Service: Orthopedics;  Laterality: Right;   CARPOMETACARPAL (CMC) FUSION OF THUMB Bilateral    CATARACT EXTRACTION, BILATERAL     CHOLECYSTECTOMY     COLONOSCOPY  02/12/2011   HAMMER TOE SURGERY Right    LAPAROSCOPIC APPENDECTOMY N/A 08/26/2021   Procedure: APPENDECTOMY LAPAROSCOPIC WITH LYSIS OF ADHESIONS;  Surgeon: Candyce Champagne, MD;  Location: [facility] ORS;  Service: General;  Laterality: N/A;   NASAL SINUS SURGERY     POLYPECTOMY     REVERSE SHOULDER ARTHROPLASTY Right 04/26/2023   Procedure: RIGHT REVERSE SHOULDER ARTHROPLASTY;  Surgeon: Jasmine Mesi, MD;  Location: [facility] OR;  Service: Orthopedics;  Laterality: Right;   TOTAL HIP ARTHROPLASTY  12/10/2011   Procedure: TOTAL HIP ARTHROPLASTY ANTERIOR APPROACH;  Surgeon: Arnie Lao, MD;  Location: [facility] ORS;  Service: Orthopedics;  Laterality: Right;  Right Total Hip Arthroplasty, Anterior Approach   Social History   Socioeconomic History   Marital status: Widowed    Spouse name: Not on file   Number of children: 0   Years of education: Not on file   Highest education level: Not on file  Occupational History   Occupation: retired    Associate Professor: RETIRED  Tobacco Use   Smoking status: Former  Current packs/day: 0.00    Types: Cigarettes    Quit date: 08/16/2001    Years since quitting: 22.3   Smokeless tobacco: Never  Vaping Use   Vaping status: Never Used  Substance and Sexual Activity   Alcohol  use: Not Currently   Drug use: No   Sexual activity: Not on file  Other Topics Concern   Not on file  Social History Narrative   Lives: alone   Employment: retired.   Social Drivers of Corporate investment banker Strain: Not on file  Food Insecurity: Not on file  Transportation Needs: Not on file  Physical Activity: Not on file  Stress: Not on file  Social Connections: Not on file   Family History  Problem Relation Age of Onset   Ovarian cancer Mother    Anxiety disorder Mother    Heart disease  Paternal Grandmother    Other Paternal Aunt        brain tumor   Birth defects Sister        no iris   Depression Sister    Anxiety disorder Sister    Colon cancer Neg Hx    Allergies  Allergen Reactions   Codeine  Nausea And Vomiting    REACTION: Lightheaded, nauses Was a high dose   Demerol [Meperidine Hcl] Other (See Comments)    Dizziness    Phenergan  [Promethazine ] Other (See Comments)    Feeling like heart attack   Current Outpatient Medications  Medication Sig Dispense Refill   acetaminophen -codeine  (TYLENOL  #3) 300-30 MG tablet Take 1 tablet by mouth daily as needed for severe pain (pain score 7-10). 15 tablet 0   albuterol  (VENTOLIN  HFA) 108 (90 Base) MCG/ACT inhaler Inhale 2 puffs into the lungs every 6 (six) hours as needed for wheezing or shortness of breath.     amitriptyline  (ELAVIL ) 25 MG tablet Take 75 mg by mouth at bedtime.      aspirin  81 MG tablet Take 81 mg by mouth daily with breakfast.     atorvastatin  (LIPITOR) 40 MG tablet Take 40 mg by mouth daily.     Cholecalciferol  (VITAMIN D3) 25 MCG (1000 UT) CAPS Take 1,000 Units by mouth daily.     docusate sodium  (COLACE) 100 MG capsule Take 1 capsule (100 mg total) by mouth 2 (two) times daily. 10 capsule 0   famotidine  (PEPCID ) 20 MG tablet Take 20 mg by mouth daily as needed for heartburn or indigestion.     hydrocortisone  (ANUSOL -HC) 2.5 % rectal cream Place 1 application. rectally 2 (two) times daily. (Patient taking differently: Place 1 application  rectally 2 (two) times daily as needed for hemorrhoids or anal itching.) 30 g 1   levothyroxine  (SYNTHROID ) 25 MCG tablet Take 1 tablet (25 mcg total) by mouth daily before breakfast. 30 tablet 0   lidocaine  (LIDODERM ) 5 % Place 1 patch onto the skin daily. Remove & Discard patch within 12 hours or as directed by MD     losartan -hydrochlorothiazide  (HYZAAR) 100-25 MG tablet Take 1 tablet by mouth daily.     Magnesium  200 MG CHEW Chew 200 mg by mouth daily.      methocarbamol  (ROBAXIN ) 500 MG tablet Take 1 tablet (500 mg total) by mouth every 8 (eight) hours as needed for muscle spasms. 30 tablet 0   Multiple Vitamin (MULTIVITAMIN) tablet Take 1 tablet by mouth daily.     omeprazole (PRILOSEC) 20 MG capsule Take 20 mg by mouth daily with breakfast.     oxymetazoline (AFRIN)  0.05 % nasal spray Place 1 spray into both nostrils daily as needed for congestion.     Psyllium (METAMUCIL) 28.3 % POWD      verapamil  (CALAN -SR) 240 MG CR tablet Take 240 mg by mouth at bedtime.     No current facility-administered medications for this visit.   No results found.  Review of Systems:   A ROS was performed including pertinent positives and negatives as documented in the HPI.  Physical Exam :   Constitutional: NAD and appears stated age Neurological: Alert and oriented Psych: Appropriate affect and cooperative There were no vitals taken for this visit.   Comprehensive Musculoskeletal Exam:    Tenderness with palpation over the midline of the lumbar spine and right-sided lumbar region paraspinal muscles.  Some soreness noted with palpation over the greater trochanter.  Passive right hip range of motion to 110 degrees forward flexion, 30 degrees external rotation, 20 degrees internal rotation.  Negative straight leg raise.  Knee flexion/extension and ankle dorsiflexion/plantarflexion strength is 5/5.  Imaging:   Xray (lumbar spine 4 views): Diffuse degenerative changes with loss of disc spacing throughout and facet arthropathy most notable from L4-S1.  Mild anterolisthesis of L3 on L4 and L4 on L5.  Xray (AP pelvis, right hip 3 views): Right hip THA components in good positioning without evidence of damage or loosening   I personally reviewed and interpreted the radiographs.      Assessment & Plan Pain in right hip and groin area   Chronic right hip and groin pain is likely multifactorial, possibly exacerbated by a recent fall. X-rays show no  replacement component issues or bony abnormality. Pain may be referred from the hip joint or lumbar spine.  Recommend follow-up evaluation with [name] should groin pain continue. Continue ibuprofen as needed for pain management and use pain medication sparingly.  Lumbar radiculopathy Advanced lumbar spine degenerative changes involve multiple levels, with MRI in 2023 showing multilevel spinal stenosis and neural foraminal stenosis. Symptoms suggest that the majority of her symptoms are emanating from the lumbar spine. Previous ESI injections provided temporary relief. Refer to [name] for evaluation and consideration of additional ESI injections, as well as any other recommended treatment modalities.     I personally saw and evaluated the patient, and participated in the management and treatment plan.  [name], PA-C Orthopedics

## 2023-12-12 ENCOUNTER — Encounter: Payer: Self-pay | Admitting: Surgical

## 2023-12-12 ENCOUNTER — Ambulatory Visit: Admitting: Surgical

## 2023-12-12 ENCOUNTER — Other Ambulatory Visit (INDEPENDENT_AMBULATORY_CARE_PROVIDER_SITE_OTHER): Payer: Self-pay

## 2023-12-12 DIAGNOSIS — M5416 Radiculopathy, lumbar region: Secondary | ICD-10-CM

## 2023-12-12 DIAGNOSIS — M25511 Pain in right shoulder: Secondary | ICD-10-CM

## 2023-12-12 DIAGNOSIS — Z96611 Presence of right artificial shoulder joint: Secondary | ICD-10-CM

## 2023-12-12 MED ORDER — ACETAMINOPHEN-CODEINE 300-30 MG PO TABS: 1.0000 | ORAL_TABLET | Freq: Two times a day (BID) | ORAL | 0 refills | Status: DC | PRN

## 2023-12-16 ENCOUNTER — Ambulatory Visit: Admitting: Surgical

## 2023-12-18 ENCOUNTER — Encounter: Payer: Self-pay | Admitting: Surgical

## 2023-12-18 NOTE — Progress Notes (Signed)
 Office Visit Note   Patient: Cassidy Garcia           Date of Birth: 1945/12/15           MRN: [id number] Visit Date: 12/12/2023 Requested by: Suan Elm, MD [address] [place],  [place] [address] PCP: Suan Elm, MD  Subjective: Chief Complaint  Patient presents with   Right Shoulder - Pain   Right Hip - Pain    HPI: Cassidy Garcia is a 78 y.o. female who presents to the office reporting right hip and right shoulder pain.  Describes right hip pain as pain that began last week with associated groin pain and pain is worse with walking and going up and down stairs.  Has some radiation into the anterior thigh as well as down to the lateral calf with associated tingling.  Denies any fevers or chills.  Has history of hip replacement to that hip done by [name].  Has taken extra strength Tylenol  with some relief.  She is ambulatory and able to put weight on her right leg.  She states that her back pain bothers her more than the leg pain.  No history of prior back surgery.  She had a ESI with [name] about 1 year ago that provided some relief for her.  She does have some buttock pain at times as well.  No radicular pain past the ankle.  She also complains of right shoulder pain.  Has history of right reverse shoulder arthroplasty that was done on 04/26/2023.  Has had several falls and wants to have x-rays of the shoulder to make sure everything looks okay..                ROS: All systems reviewed are negative as they relate to the chief complaint within the history of present illness.  Patient denies fevers or chills.  Assessment & Plan: Visit Diagnoses:  1. S/P reverse total shoulder arthroplasty, right   2. Acute pain of right shoulder   3. Lumbar radiculopathy     Plan: Patient is a 79 year old female who presents for evaluation of right hip and right shoulder complaints.  She has right hip pain that has been ongoing in the last 2 weeks with pain localizing to the  groin as well as radiating pain down to the level of the ankle.  She has radiographs of the right hip that were done when she saw [name], PA-C last week.  A lot of her pain seems to be more back related with history of prior lumbar spine MRI in June 2023 demonstrating severe spinal canal stenosis at L3-L4 and L4-L5 as well as mild to moderate foraminal narrowing at L5-S1.  She has had prior ESI with relief as done by [name].  We will try and repeat this and if no improvement, she should follow-up with [name] for further evaluation of her right hip pain.  Temporary one-time prescription for Tylenol  3 prescribed for patient just to help with sleeping when she goes on vacation.  Regarding the right shoulder, she has radiographs taken today demonstrating no significant change compared with prior radiographs.  She has had several falls and she does have some crepitus that is noted with passive motion of the shoulder on exam today.  With this new crepitus that is not causing her discomfort but is causing her some concern, recommended proceeding with CT scan of the right shoulder.  She would like to hold off on  this for now and waited out and if there is no improvement, next step would be CT scan.  Currently it is not interfering with her function and her active motion of the right shoulder is equivalent to a passive motion on exam today.  Follow-up with the office as needed if pain does not improve after lumbar spine ESI.  Follow-Up Instructions: No follow-ups on file.   Orders:  Orders Placed This Encounter  Procedures   XR Shoulder Right   Ambulatory referral to Physical Medicine Rehab   Meds ordered this encounter  Medications   acetaminophen -codeine  (TYLENOL  #3) 300-30 MG tablet    Sig: Take 1 tablet by mouth every 12 (twelve) hours as needed for moderate pain (pain score 4-6).    Dispense:  20 tablet    Refill:  0      Procedures: No procedures performed   Clinical  Data: No additional findings.  Objective: Vital Signs: There were no vitals taken for this visit.  Physical Exam:  Constitutional: Patient appears well-developed HEENT:  Head: Normocephalic Eyes:EOM are normal Neck: Normal range of motion Cardiovascular: Normal rate Pulmonary/chest: Effort normal Neurologic: Patient is alert Skin: Skin is warm Psychiatric: Patient has normal mood and affect  Ortho Exam: Ortho exam demonstrates right shoulder with 60 degrees X rotation, 100 degrees abduction, 150 degrees forward elevation passively and actively.  She does have some crepitus that is noted around the acromion that is not very painful but it is noticeable for the patient.  Incision is well-healed over the anterior aspect of the right shoulder.  Axillary nerve intact with deltoid firing.  Intact subscap strength rated 5/5.  Intact EPL, FPL, finger abduction.  No tenderness over the acromion.  No tenderness over the [facility] joint.  Bicep contour looks appropriate.  Right hip with intact hip flexion, quadricep, hamstring, dorsiflexion, plantarflexion, EHL.  She has incision that is well-healed over the anterior aspect of the right hip.  No tenderness over the greater trochanter.  No Trendelenburg gait.  She has positive straight leg raise on the right.  Very minimal pain reproduced with hip range of motion.    Specialty Comments:  MRI LUMBAR SPINE WITHOUT CONTRAST   TECHNIQUE: Multiplanar, multisequence MR imaging of the lumbar spine was performed. No intravenous contrast was administered.   COMPARISON:  Radiographs December 02, 2021.   FINDINGS: Segmentation:  Standard.   Alignment: Dextroconvex scoliosis. Small anterolisthesis at L3-4 and L4-5. Small retrolisthesis at L1-2, L2-3 and L5-S1.   Vertebrae: No fracture, evidence of discitis, or bone lesion. Endplate degenerative changes with loss of disc height and Schmorl nodes throughout the lumbar spine with associated marrow edema at L3-4  and L4-5.   Conus medullaris and cauda equina: Conus extends to the L1 level. Conus and cauda equina appear normal.   Paraspinal and other soft tissues: Negative.   Disc levels:   T12-L1: Disc bulge and moderate facet degenerative changes without significant spinal canal or neural foraminal stenosis.   L1-2: Disc bulge, moderate facet degenerative changes and ligamentum flavum redundancy resulting in mild spinal canal stenosis with narrowing of the bilateral subarticular zones and mild bilateral foraminal.   L2-3: Disc bulge, moderate hypertrophic facet degenerative changes and ligamentum flavum redundancy resulting in mild spinal canal stenosis, mild right and moderate left neural foraminal narrowing.   L3-4: Disc bulge, prominent hypertrophic facet degenerative changes with bilateral joint effusion and ligamentum flavum redundancy resulting in severe spinal canal stenosis, mild right and severe left neural foraminal.  L4-5: Disc bulge, prominent hypertrophic facet degenerative changes with bilateral joint effusion and ligamentum flavum redundancy resulting in severe spinal canal stenosis, mild right and moderate left neural foraminal narrowing.   L5-S1: Disc bulge, prominent hypertrophic facet degenerative changes with bilateral joint effusion and ligamentum flavum redundancy resulting in severe spinal canal stenosis and mild-to-moderate bilateral foraminal narrowing.   IMPRESSION: 1. 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 2. Multilevel neural foraminal narrowing severe on the left at L3-4 and moderate on the left at L2-3 and L4-5.     Electronically Signed   By: Katyucia  de Macedo Rodrigues M.D.   On: 01/21/2022 16:03  Imaging: No results found.   PMFS History: Patient Active Problem List   Diagnosis Date Noted   Arthritis of right shoulder region 04/30/2023   Biceps tendonitis on right  04/30/2023   S/P reverse total shoulder arthroplasty, right 04/26/2023   Rotator cuff tendonitis, right 07/02/2022   Nausea 12/02/2021   Bright red rectal bleeding 09/15/2021   History of colonic polyps 07/13/2021   Gastroenteritis 09/09/2020   Anemia 03/06/2020   Hypothyroidism 02/04/2020   Depression 01/24/2020   Asthma 01/24/2020   CKD (chronic kidney disease), stage III (HCC) 01/24/2020   Acute on chronic renal failure (HCC) 01/24/2020   Constipation 12/20/2018   Pain in right hip 06/29/2017   Trochanteric bursitis, right hip 06/29/2017   History of total replacement of right hip 06/29/2017   Hemorrhoids 03/01/2017   Great toe pain, left 12/04/2016   Chronic right shoulder pain 12/04/2016   Non-toxic multinodular goiter 05/14/2013   Degenerative arthritis of hip 12/10/2011   HTN (hypertension) 07/06/2011   Hyperlipidemia 07/06/2011   Gastro-esophageal reflux disease without esophagitis 09/23/2009   Past Medical History:  Diagnosis Date   Arthritis    Asthma    Bronchitis 03/2016   Chronic kidney disease    stage 3   Colon polyps    tubular adenoma   Constipation    Depression    pt denies   Diverticulosis    GERD (gastroesophageal reflux disease)    Hemorrhoids    History of hiatal hernia    Hyperlipidemia    Hypertension    Hypothyroidism    Shortness of breath    with exertion on occasion   Sinusitis    Skin cancer     Family History  Problem Relation Age of Onset   Ovarian cancer Mother    Anxiety disorder Mother    Heart disease Paternal Grandmother    Other Paternal Aunt        brain tumor   Birth defects Sister        no iris   Depression Sister    Anxiety disorder Sister    Colon cancer Neg Hx     Past Surgical History:  Procedure Laterality Date   ABDOMINAL HYSTERECTOMY     BICEPT TENODESIS Right 04/26/2023   Procedure: RIGHT BICEPS TENODESIS;  Surgeon: Jasmine Mesi, MD;  Location: [facility] OR;  Service: Orthopedics;  Laterality: Right;    CARPOMETACARPAL (CMC) FUSION OF THUMB Bilateral    CATARACT EXTRACTION, BILATERAL     CHOLECYSTECTOMY     COLONOSCOPY  02/12/2011   HAMMER TOE SURGERY Right    LAPAROSCOPIC APPENDECTOMY N/A 08/26/2021   Procedure: APPENDECTOMY LAPAROSCOPIC WITH LYSIS OF ADHESIONS;  Surgeon: Candyce Champagne, MD;  Location: [facility] ORS;  Service: General;  Laterality: N/A;   NASAL SINUS SURGERY  POLYPECTOMY     REVERSE SHOULDER ARTHROPLASTY Right 04/26/2023   Procedure: RIGHT REVERSE SHOULDER ARTHROPLASTY;  Surgeon: Jasmine Mesi, MD;  Location: [facility] OR;  Service: Orthopedics;  Laterality: Right;   TOTAL HIP ARTHROPLASTY  12/10/2011   Procedure: TOTAL HIP ARTHROPLASTY ANTERIOR APPROACH;  Surgeon: Arnie Lao, MD;  Location: [facility] ORS;  Service: Orthopedics;  Laterality: Right;  Right Total Hip Arthroplasty, Anterior Approach   Social History   Occupational History   Occupation: retired    Associate Professor: RETIRED  Tobacco Use   Smoking status: Former    Current packs/day: 0.00    Types: Cigarettes    Quit date: 08/16/2001    Years since quitting: 22.3   Smokeless tobacco: Never  Vaping Use   Vaping status: Never Used  Substance and Sexual Activity   Alcohol  use: Not Currently   Drug use: No   Sexual activity: Not on file

## 2023-12-28 ENCOUNTER — Other Ambulatory Visit: Payer: Self-pay

## 2023-12-28 ENCOUNTER — Telehealth: Payer: Self-pay | Admitting: Surgical

## 2023-12-28 ENCOUNTER — Ambulatory Visit: Admitting: Physical Medicine and Rehabilitation

## 2023-12-28 VITALS — BP 145/89 | HR 106

## 2023-12-28 DIAGNOSIS — M5416 Radiculopathy, lumbar region: Secondary | ICD-10-CM | POA: Diagnosis not present

## 2023-12-28 MED ORDER — METHYLPREDNISOLONE ACETATE 40 MG/ML IJ SUSP
40.0000 mg | Freq: Once | INTRAMUSCULAR | Status: AC
  Administered 2023-12-28: 40 mg

## 2023-12-28 NOTE — Telephone Encounter (Signed)
 I completed a notes and sent it to her on MyChart and Newton Barer will also give her a copy tomorrow

## 2023-12-28 NOTE — Telephone Encounter (Signed)
 Patient called and said she left a letter for [place] and Lucienne Ryder to sign and wanted to know how long it would take? She stated that it has been 10 days now. CB#[phone]

## 2023-12-28 NOTE — Patient Instructions (Signed)

## 2023-12-28 NOTE — Progress Notes (Signed)
 Pain Scale   Average Pain 4 Patient advising her pain is worse in the AM when she gets up and walking helps relieve pain, patient states the pain radiates to her right leg at times. Patient advises that she sits to relieve the pain during the day.        +Driver, -BT, -Dye Allergies.

## 2023-12-29 NOTE — Telephone Encounter (Signed)
 At front for pick up.

## 2024-01-03 DIAGNOSIS — K1321 Leukoplakia of oral mucosa, including tongue: Secondary | ICD-10-CM | POA: Diagnosis not present

## 2024-01-03 DIAGNOSIS — K136 Irritative hyperplasia of oral mucosa: Secondary | ICD-10-CM | POA: Diagnosis not present

## 2024-01-04 NOTE — Progress Notes (Signed)
 Cassidy Garcia - 78 y.o. female MRN [id number]  Date of birth: 1945/09/07  Office Visit Note: Visit Date: 12/28/2023 PCP: Suan Elm, MD Referred by: Suan Elm, MD  Subjective: Chief Complaint  Patient presents with   Lower Back - Pain   HPI:  Cassidy Garcia is a 78 y.o. female who comes in today at the request of Prentis Brock, PA-C for planned Right L5-S1 Lumbar Interlaminar epidural steroid injection with fluoroscopic guidance.  The patient has failed conservative care including home exercise, medications, time and activity modification.  This injection will be diagnostic and hopefully therapeutic.  Please see requesting physician notes for further details and justification.   ROS Otherwise per HPI.  Assessment & Plan: Visit Diagnoses:    ICD-10-CM   1. Lumbar radiculopathy  M54.16 XR C-ARM NO REPORT    Epidural Steroid injection    methylPREDNISolone  acetate (DEPO-MEDROL ) injection 40 mg      Plan: No additional findings.   Meds & Orders:  Meds ordered this encounter  Medications   methylPREDNISolone  acetate (DEPO-MEDROL ) injection 40 mg    Orders Placed This Encounter  Procedures   XR C-ARM NO REPORT   Epidural Steroid injection    Follow-up: Return for visit to requesting provider as needed.   Procedures: No procedures performed  Lumbar Epidural Steroid Injection - Interlaminar Approach with Fluoroscopic Guidance  Patient: Cassidy Garcia      Date of Birth: 1945/10/22 MRN: [id number] PCP: Suan Elm, MD      Visit Date: 12/28/2023   Universal Protocol:     Consent Given By: the patient  Position: PRONE  Additional Comments: Vital signs were monitored before and after the procedure. Patient was prepped and draped in the usual sterile fashion. The correct patient, procedure, and site was verified.   Injection Procedure Details:   Procedure diagnoses: Lumbar radiculopathy [M54.16]   Meds Administered:  Meds ordered this encounter   Medications   methylPREDNISolone  acetate (DEPO-MEDROL ) injection 40 mg     Laterality: Right  Location/Site:  L5-S1  Needle: 3.5 in., 20 ga. Tuohy  Needle Placement: Paramedian epidural  Findings:   -Comments: Excellent flow of contrast into the epidural space.  Procedure Details: Using a paramedian approach from the side mentioned above, the region overlying the inferior lamina was localized under fluoroscopic visualization and the soft tissues overlying this structure were infiltrated with 4 ml. of 1% Lidocaine  without Epinephrine . The Tuohy needle was inserted into the epidural space using a paramedian approach.   The epidural space was localized using loss of resistance along with counter oblique bi-planar fluoroscopic views.  After negative aspirate for air, blood, and CSF, a 2 ml. volume of Isovue -250 was injected into the epidural space and the flow of contrast was observed. Radiographs were obtained for documentation purposes.    The injectate was administered into the level noted above.   Additional Comments:  The patient tolerated the procedure well Dressing: 2 x 2 sterile gauze and Band-Aid    Post-procedure details: Patient was observed during the procedure. Post-procedure instructions were reviewed.  Patient left the clinic in stable condition.   Clinical History: MRI LUMBAR SPINE WITHOUT CONTRAST   TECHNIQUE: Multiplanar, multisequence MR imaging of the lumbar spine was performed. No intravenous contrast was administered.   COMPARISON:  Radiographs December 02, 2021.   FINDINGS: Segmentation:  Standard.   Alignment: Dextroconvex scoliosis. Small anterolisthesis at L3-4 and L4-5. Small retrolisthesis at L1-2, L2-3 and L5-S1.   Vertebrae: No fracture, evidence  of discitis, or bone lesion. Endplate degenerative changes with loss of disc height and Schmorl nodes throughout the lumbar spine with associated marrow edema at L3-4 and L4-5.   Conus medullaris  and cauda equina: Conus extends to the L1 level. Conus and cauda equina appear normal.   Paraspinal and other soft tissues: Negative.   Disc levels:   T12-L1: Disc bulge and moderate facet degenerative changes without significant spinal canal or neural foraminal stenosis.   L1-2: Disc bulge, moderate facet degenerative changes and ligamentum flavum redundancy resulting in mild spinal canal stenosis with narrowing of the bilateral subarticular zones and mild bilateral foraminal.   L2-3: Disc bulge, moderate hypertrophic facet degenerative changes and ligamentum flavum redundancy resulting in mild spinal canal stenosis, mild right and moderate left neural foraminal narrowing.   L3-4: Disc bulge, prominent hypertrophic facet degenerative changes with bilateral joint effusion and ligamentum flavum redundancy resulting in severe spinal canal stenosis, mild right and severe left neural foraminal.   L4-5: Disc bulge, prominent hypertrophic facet degenerative changes with bilateral joint effusion and ligamentum flavum redundancy resulting in severe spinal canal stenosis, mild right and moderate left neural foraminal narrowing.   L5-S1: Disc bulge, prominent hypertrophic facet degenerative changes with bilateral joint effusion and ligamentum flavum redundancy resulting in severe spinal canal stenosis and mild-to-moderate bilateral foraminal narrowing.   IMPRESSION: 1. 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 2. Multilevel neural foraminal narrowing severe on the left at L3-4 and moderate on the left at L2-3 and L4-5.     Electronically Signed   By: Katyucia  de Macedo Rodrigues M.D.   On: 01/21/2022 16:03     Objective:  VS:  HT:    WT:   BMI:     BP:(!) 145/89  HR:(!) 106bpm  TEMP: ( )  RESP:  Physical Exam Vitals and nursing note reviewed.  Constitutional:      General: She is not in acute distress.     Appearance: Normal appearance. She is not ill-appearing.  HENT:     Head: Normocephalic and atraumatic.     Right Ear: External ear normal.     Left Ear: External ear normal.  Eyes:     Extraocular Movements: Extraocular movements intact.  Cardiovascular:     Rate and Rhythm: Normal rate.     Pulses: Normal pulses.  Pulmonary:     Effort: Pulmonary effort is normal. No respiratory distress.  Abdominal:     General: There is no distension.     Palpations: Abdomen is soft.  Musculoskeletal:        General: Tenderness present.     Cervical back: Neck supple.     Right lower leg: No edema.     Left lower leg: No edema.     Comments: Patient has good distal strength with no pain over the greater trochanters.  No clonus or focal weakness.  Skin:    Findings: No erythema, lesion or rash.  Neurological:     General: No focal deficit present.     Mental Status: She is alert and oriented to person, place, and time.     Sensory: No sensory deficit.     Motor: No weakness or abnormal muscle tone.     Coordination: Coordination normal.  Psychiatric:        Mood and Affect: Mood normal.        Behavior: Behavior normal.      Imaging: No results found.

## 2024-01-04 NOTE — Procedures (Signed)
 Lumbar Epidural Steroid Injection - Interlaminar Approach with Fluoroscopic Guidance  Patient: Cassidy Garcia      Date of Birth: May 08, 1946 MRN: [id number] PCP: Suan Elm, MD      Visit Date: 12/28/2023   Universal Protocol:     Consent Given By: the patient  Position: PRONE  Additional Comments: Vital signs were monitored before and after the procedure. Patient was prepped and draped in the usual sterile fashion. The correct patient, procedure, and site was verified.   Injection Procedure Details:   Procedure diagnoses: Lumbar radiculopathy [M54.16]   Meds Administered:  Meds ordered this encounter  Medications   methylPREDNISolone  acetate (DEPO-MEDROL ) injection 40 mg     Laterality: Right  Location/Site:  L5-S1  Needle: 3.5 in., 20 ga. Tuohy  Needle Placement: Paramedian epidural  Findings:   -Comments: Excellent flow of contrast into the epidural space.  Procedure Details: Using a paramedian approach from the side mentioned above, the region overlying the inferior lamina was localized under fluoroscopic visualization and the soft tissues overlying this structure were infiltrated with 4 ml. of 1% Lidocaine  without Epinephrine . The Tuohy needle was inserted into the epidural space using a paramedian approach.   The epidural space was localized using loss of resistance along with counter oblique bi-planar fluoroscopic views.  After negative aspirate for air, blood, and CSF, a 2 ml. volume of Isovue -250 was injected into the epidural space and the flow of contrast was observed. Radiographs were obtained for documentation purposes.    The injectate was administered into the level noted above.   Additional Comments:  The patient tolerated the procedure well Dressing: 2 x 2 sterile gauze and Band-Aid    Post-procedure details: Patient was observed during the procedure. Post-procedure instructions were reviewed.  Patient left the clinic in stable  condition.

## 2024-01-26 DIAGNOSIS — I10 Essential (primary) hypertension: Secondary | ICD-10-CM | POA: Diagnosis not present

## 2024-01-26 DIAGNOSIS — J069 Acute upper respiratory infection, unspecified: Secondary | ICD-10-CM | POA: Diagnosis not present

## 2024-01-26 DIAGNOSIS — R058 Other specified cough: Secondary | ICD-10-CM | POA: Diagnosis not present

## 2024-01-26 DIAGNOSIS — K219 Gastro-esophageal reflux disease without esophagitis: Secondary | ICD-10-CM | POA: Diagnosis not present

## 2024-03-05 DIAGNOSIS — E039 Hypothyroidism, unspecified: Secondary | ICD-10-CM | POA: Diagnosis not present

## 2024-03-05 DIAGNOSIS — I10 Essential (primary) hypertension: Secondary | ICD-10-CM | POA: Diagnosis not present

## 2024-03-05 DIAGNOSIS — E785 Hyperlipidemia, unspecified: Secondary | ICD-10-CM | POA: Diagnosis not present

## 2024-03-05 DIAGNOSIS — E042 Nontoxic multinodular goiter: Secondary | ICD-10-CM | POA: Diagnosis not present

## 2024-03-05 DIAGNOSIS — K219 Gastro-esophageal reflux disease without esophagitis: Secondary | ICD-10-CM | POA: Diagnosis not present

## 2024-03-05 DIAGNOSIS — M1990 Unspecified osteoarthritis, unspecified site: Secondary | ICD-10-CM | POA: Diagnosis not present

## 2024-03-05 DIAGNOSIS — I129 Hypertensive chronic kidney disease with stage 1 through stage 4 chronic kidney disease, or unspecified chronic kidney disease: Secondary | ICD-10-CM | POA: Diagnosis not present

## 2024-03-05 DIAGNOSIS — N1831 Chronic kidney disease, stage 3a: Secondary | ICD-10-CM | POA: Diagnosis not present

## 2024-03-22 DIAGNOSIS — Z85828 Personal history of other malignant neoplasm of skin: Secondary | ICD-10-CM | POA: Diagnosis not present

## 2024-03-22 DIAGNOSIS — L905 Scar conditions and fibrosis of skin: Secondary | ICD-10-CM | POA: Diagnosis not present

## 2024-03-22 DIAGNOSIS — L821 Other seborrheic keratosis: Secondary | ICD-10-CM | POA: Diagnosis not present

## 2024-03-22 DIAGNOSIS — D2271 Melanocytic nevi of right lower limb, including hip: Secondary | ICD-10-CM | POA: Diagnosis not present

## 2024-03-22 DIAGNOSIS — D2272 Melanocytic nevi of left lower limb, including hip: Secondary | ICD-10-CM | POA: Diagnosis not present

## 2024-03-22 DIAGNOSIS — D225 Melanocytic nevi of trunk: Secondary | ICD-10-CM | POA: Diagnosis not present

## 2024-03-22 DIAGNOSIS — L570 Actinic keratosis: Secondary | ICD-10-CM | POA: Diagnosis not present

## 2024-04-02 DIAGNOSIS — N390 Urinary tract infection, site not specified: Secondary | ICD-10-CM | POA: Diagnosis not present

## 2024-04-02 DIAGNOSIS — N1832 Chronic kidney disease, stage 3b: Secondary | ICD-10-CM | POA: Diagnosis not present

## 2024-04-05 DIAGNOSIS — D631 Anemia in chronic kidney disease: Secondary | ICD-10-CM | POA: Diagnosis not present

## 2024-04-05 DIAGNOSIS — I129 Hypertensive chronic kidney disease with stage 1 through stage 4 chronic kidney disease, or unspecified chronic kidney disease: Secondary | ICD-10-CM | POA: Diagnosis not present

## 2024-04-05 DIAGNOSIS — N2581 Secondary hyperparathyroidism of renal origin: Secondary | ICD-10-CM | POA: Diagnosis not present

## 2024-04-05 DIAGNOSIS — N1832 Chronic kidney disease, stage 3b: Secondary | ICD-10-CM | POA: Diagnosis not present

## 2024-04-20 ENCOUNTER — Telehealth: Payer: Self-pay

## 2024-04-20 NOTE — Telephone Encounter (Signed)
 Cassidy Garcia

## 2024-04-27 ENCOUNTER — Ambulatory Visit (INDEPENDENT_AMBULATORY_CARE_PROVIDER_SITE_OTHER): Admitting: Physical Medicine and Rehabilitation

## 2024-04-27 ENCOUNTER — Encounter: Payer: Self-pay | Admitting: Physical Medicine and Rehabilitation

## 2024-04-27 DIAGNOSIS — M5441 Lumbago with sciatica, right side: Secondary | ICD-10-CM

## 2024-04-27 DIAGNOSIS — M5416 Radiculopathy, lumbar region: Secondary | ICD-10-CM

## 2024-04-27 DIAGNOSIS — M48062 Spinal stenosis, lumbar region with neurogenic claudication: Secondary | ICD-10-CM | POA: Diagnosis not present

## 2024-04-27 DIAGNOSIS — M5442 Lumbago with sciatica, left side: Secondary | ICD-10-CM | POA: Diagnosis not present

## 2024-04-27 DIAGNOSIS — G8929 Other chronic pain: Secondary | ICD-10-CM

## 2024-04-27 NOTE — Progress Notes (Signed)
 Pain Scale   Average Pain 5 Patient advising she has lower back pain radiating to right hip and leg pain increasing when standing and walking.        +Driver, -BT, -Dye Allergies.

## 2024-04-27 NOTE — Progress Notes (Signed)
 Cassidy Garcia - 78 y.o. female MRN [id number]  Date of birth: Nov 26, 1945  Office Visit Note: Visit Date: 04/27/2024 PCP: Shepard Ade, MD Referred by: Shepard Ade, MD  Subjective: Chief Complaint  Patient presents with   Lower Back - Pain   HPI: Cassidy Garcia is a 78 y.o. female who comes in today for evaluation of chronic, worsening and severe bilateral lower back pain radiating to hips and buttocks, right greater than left. Also reports tingling to right lower extremity, she noticed this last week. Pain ongoing for several years, worsens with prolonged standing and walking. She describes pain as sharp and aching, currently rates as 8 out of 10. Some relief of pain with home exercise regimen, rest and use of medications. She does take Tylenol  #3 and Norco as needed for severe pain. History of formal physical therapy at [facility] with minimal relief of pain. Lumbar MRI imaging from 2023 shows multi level advanced degenerative changes and severe spinal canal stenosis at L3-L4 and L4-L5. She underwent right L5-S1 interlaminar epidural steroid injection in our office on 12/28/2023, she reports greater than 80% relief with this procedure. Also reports increased functional ability post procedure. Patient denies focal weakness. No recent trauma or falls.      Review of Systems  Musculoskeletal:  Positive for back pain.  Neurological:  Positive for tingling. Negative for focal weakness and weakness.  All other systems reviewed and are negative.  Otherwise per HPI.  Assessment & Plan: Visit Diagnoses:    ICD-10-CM   1. Chronic bilateral low back pain with bilateral sciatica  M54.42 Ambulatory referral to Physical Medicine Rehab   M54.41    G89.29     2. Lumbar radiculopathy  M54.16 Ambulatory referral to Physical Medicine Rehab    3. Spinal stenosis of lumbar region with neurogenic claudication  M48.062 Ambulatory referral to Physical Medicine Rehab       Plan: Findings:   Chronic, worsening and severe bilateral lower back pain radiating to hips and buttocks, right greater than left. Paresthesias to right lower extremity. Patient continues to have severe pain despite good conservative therapies such as formal physical therapy, home exercise regimen, rest and use of medications. Patients clinical presentation and exam are consistent with neurogenic claudication as a result of spinal canal stenosis. There is severe spinal canal stenosis at L3-L4 and L4-L5. She has done well with intermittent lumbar epidural steroid injections in the past. Next step is to perform diagnostic and hopefully therapeutic right L5-S1 interlaminar epidural steroid injection under fluoroscopic guidance. If good relief of pain with injection we can repeat infrequently as needed. She has no questions at this time. No red flag symptoms noted upon exam today.     Meds & Orders: No orders of the defined types were placed in this encounter.   Orders Placed This Encounter  Procedures   Ambulatory referral to Physical Medicine Rehab    Follow-up: Return for Right L5-S1 interlaminar epidural steroid injection.   Procedures: No procedures performed      Clinical History: MRI LUMBAR SPINE WITHOUT CONTRAST   TECHNIQUE: Multiplanar, multisequence MR imaging of the lumbar spine was performed. No intravenous contrast was administered.   COMPARISON:  Radiographs December 02, 2021.   FINDINGS: Segmentation:  Standard.   Alignment: Dextroconvex scoliosis. Small anterolisthesis at L3-4 and L4-5. Small retrolisthesis at L1-2, L2-3 and L5-S1.   Vertebrae: No fracture, evidence of discitis, or bone lesion. Endplate degenerative changes with loss of disc height and Schmorl nodes  throughout the lumbar spine with associated marrow edema at L3-4 and L4-5.   Conus medullaris and cauda equina: Conus extends to the L1 level. Conus and cauda equina appear normal.   Paraspinal and other soft tissues:  Negative.   Disc levels:   T12-L1: Disc bulge and moderate facet degenerative changes without significant spinal canal or neural foraminal stenosis.   L1-2: Disc bulge, moderate facet degenerative changes and ligamentum flavum redundancy resulting in mild spinal canal stenosis with narrowing of the bilateral subarticular zones and mild bilateral foraminal.   L2-3: Disc bulge, moderate hypertrophic facet degenerative changes and ligamentum flavum redundancy resulting in mild spinal canal stenosis, mild right and moderate left neural foraminal narrowing.   L3-4: Disc bulge, prominent hypertrophic facet degenerative changes with bilateral joint effusion and ligamentum flavum redundancy resulting in severe spinal canal stenosis, mild right and severe left neural foraminal.   L4-5: Disc bulge, prominent hypertrophic facet degenerative changes with bilateral joint effusion and ligamentum flavum redundancy resulting in severe spinal canal stenosis, mild right and moderate left neural foraminal narrowing.   L5-S1: Disc bulge, prominent hypertrophic facet degenerative changes with bilateral joint effusion and ligamentum flavum redundancy resulting in severe spinal canal stenosis and mild-to-moderate bilateral foraminal narrowing.   IMPRESSION: 1. 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 2. Multilevel neural foraminal narrowing severe on the left at L3-4 and moderate on the left at L2-3 and L4-5.     Electronically Signed   By: Katyucia  de Macedo Rodrigues M.D.   On: 01/21/2022 16:03   She reports that she quit smoking about 22 years ago. Her smoking use included cigarettes. She has never used smokeless tobacco. No results for input(s): HGBA1C, LABURIC in the last 8760 hours.  Objective:  VS:  HT:    WT:   BMI:     BP:   HR: bpm  TEMP: ( )  RESP:  Physical Exam Vitals and nursing note reviewed.  HENT:      Head: Normocephalic and atraumatic.     Right Ear: External ear normal.     Left Ear: External ear normal.     Nose: Nose normal.     Mouth/Throat:     Mouth: Mucous membranes are moist.  Eyes:     Extraocular Movements: Extraocular movements intact.  Cardiovascular:     Rate and Rhythm: Normal rate.     Pulses: Normal pulses.  Pulmonary:     Effort: Pulmonary effort is normal.  Abdominal:     General: Abdomen is flat. There is no distension.  Musculoskeletal:        General: Tenderness present.     Cervical back: Normal range of motion.     Comments: Patient is slow to rise from seated position to standing. Good lumbar range of motion. No pain noted with facet loading. 5/5 strength noted with bilateral hip flexion, knee flexion/extension, ankle dorsiflexion/plantarflexion and EHL. No clonus noted bilaterally. No pain upon palpation of greater trochanters. No pain with internal/external rotation of bilateral hips. Sensation intact bilaterally. Negative slump test bilaterally. Ambulates without aid, gait slow.     Skin:    General: Skin is warm and dry.     Capillary Refill: Capillary refill takes less than 2 seconds.  Neurological:     General: No focal deficit present.     Mental Status: She is alert and oriented to person, place, and time.  Psychiatric:  Mood and Affect: Mood normal.        Behavior: Behavior normal.     Ortho Exam  Imaging: No results found.  Past Medical/Family/Surgical/Social History: Medications & Allergies reviewed per EMR, new medications updated. 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   Rotator cuff tendonitis, right 07/02/2022   Nausea 12/02/2021   Bright red rectal bleeding 09/15/2021   History of colonic polyps 07/13/2021   Gastroenteritis 09/09/2020   Anemia 03/06/2020    Hypothyroidism 02/04/2020   Depression 01/24/2020   Asthma 01/24/2020   CKD (chronic kidney disease), stage III (HCC) 01/24/2020   Acute on chronic renal failure (HCC) 01/24/2020   Constipation 12/20/2018   Pain in right hip 06/29/2017   Trochanteric bursitis, right hip 06/29/2017   History of total replacement of right hip 06/29/2017   Hemorrhoids 03/01/2017   Great toe pain, left 12/04/2016   Chronic right shoulder pain 12/04/2016   Non-toxic multinodular goiter 05/14/2013   Degenerative arthritis of hip 12/10/2011   HTN (hypertension) 07/06/2011   Hyperlipidemia 07/06/2011   Gastro-esophageal reflux disease without esophagitis 09/23/2009   Past Medical History:  Diagnosis Date   Arthritis    Asthma    Bronchitis 03/2016   Chronic kidney disease    stage 3   Colon polyps    tubular adenoma   Constipation    Depression    pt denies   Diverticulosis    GERD (gastroesophageal reflux disease)    Hemorrhoids    History of hiatal hernia    Hyperlipidemia    Hypertension    Hypothyroidism    Shortness of breath    with exertion on occasion   Sinusitis    Skin cancer    Family History  Problem Relation Age of Onset   Ovarian cancer Mother    Anxiety disorder Mother    Heart disease Paternal Grandmother    Other Paternal Aunt        brain tumor   Birth defects Sister        no iris   Depression Sister    Anxiety disorder Sister    Colon cancer Neg Hx    Past Surgical History:  Procedure Laterality Date   ABDOMINAL HYSTERECTOMY     BICEPT TENODESIS Right 04/26/2023   Procedure: RIGHT BICEPS TENODESIS;  Surgeon: Addie Cordella Hamilton, MD;  Location: [facility] OR;  Service: Orthopedics;  Laterality: Right;   CARPOMETACARPAL (CMC) FUSION OF THUMB Bilateral    CATARACT EXTRACTION, BILATERAL     CHOLECYSTECTOMY     COLONOSCOPY  02/12/2011   HAMMER TOE SURGERY Right    LAPAROSCOPIC APPENDECTOMY N/A 08/26/2021   Procedure: APPENDECTOMY LAPAROSCOPIC WITH LYSIS OF ADHESIONS;   Surgeon: Sheldon Standing, MD;  Location: [facility] ORS;  Service: General;  Laterality: N/A;   NASAL SINUS SURGERY     POLYPECTOMY     REVERSE SHOULDER ARTHROPLASTY Right 04/26/2023   Procedure: RIGHT REVERSE SHOULDER ARTHROPLASTY;  Surgeon: Addie Cordella Hamilton, MD;  Location: [facility] OR;  Service: Orthopedics;  Laterality: Right;   TOTAL HIP ARTHROPLASTY  12/10/2011   Procedure: TOTAL HIP ARTHROPLASTY ANTERIOR APPROACH;  Surgeon: Lonni CINDERELLA Poli, MD;  Location: [facility] ORS;  Service: Orthopedics;  Laterality: Right;  Right Total Hip Arthroplasty, Anterior Approach   Social History   Occupational History   Occupation: retired    Associate Professor: RETIRED  Tobacco  Use   Smoking status: Former    Current packs/day: 0.00    Types: Cigarettes    Quit date: 08/16/2001    Years since quitting: 22.7   Smokeless tobacco: Never  Vaping Use   Vaping status: Never Used  Substance and Sexual Activity   Alcohol  use: Not Currently   Drug use: No   Sexual activity: Not on file

## 2024-05-15 ENCOUNTER — Telehealth: Payer: Self-pay | Admitting: Radiology

## 2024-05-15 DIAGNOSIS — Z23 Encounter for immunization: Secondary | ICD-10-CM | POA: Diagnosis not present

## 2024-05-15 NOTE — Telephone Encounter (Signed)
 Patient called requesting to use only cellphone number for further calls or messages - FYI

## 2024-05-22 ENCOUNTER — Encounter: Admitting: Physical Medicine and Rehabilitation

## 2024-05-22 DIAGNOSIS — S61451A Open bite of right hand, initial encounter: Secondary | ICD-10-CM | POA: Diagnosis not present

## 2024-05-22 DIAGNOSIS — W5501XA Bitten by cat, initial encounter: Secondary | ICD-10-CM | POA: Diagnosis not present

## 2024-06-15 ENCOUNTER — Ambulatory Visit: Admitting: Surgical

## 2024-06-15 ENCOUNTER — Other Ambulatory Visit (INDEPENDENT_AMBULATORY_CARE_PROVIDER_SITE_OTHER): Payer: Self-pay

## 2024-06-15 DIAGNOSIS — M25511 Pain in right shoulder: Secondary | ICD-10-CM

## 2024-06-17 ENCOUNTER — Encounter: Payer: Self-pay | Admitting: Surgical

## 2024-06-17 NOTE — Progress Notes (Signed)
 Office Visit Note   Patient: Cassidy Garcia           Date of Birth: 18-Feb-1946           MRN: [id number] Visit Date: 06/15/2024 Requested by: Shepard Ade, MD [address] [place],  [place] [address] PCP: Shepard Ade, MD  Subjective: Chief Complaint  Patient presents with   Right Shoulder - Pain    HPI: Cassidy Garcia is a 78 y.o. female who presents to the office reporting right shoulder discomfort and bruising.  Patient states that she denies any history of injury.  She has history of right reverse shoulder arthroplasty that has done well for her and this was performed by Dr. Addie on 04/26/2023.  She has been doing well with this up until little bit of discomfort in the last few weeks and then a week ago she noticed more moderate pain and a hard bump in the front of the shoulder without any injury or mechanical symptoms.  This bump resolved on its own but she says that while it was there she was able to put her fingers in the front of the shoulder and feel a very hard lump.  Then on Wednesday, 2 to 3 days ago she noticed bruising that is primarily in the mid bicep region and around the inferior aspect of her shoulder arthroplasty incision.  She takes aspirin  daily.  No history of DVT.  Does not really have any significant discomfort currently.  She never has any symptoms of instability and does not feel like her shoulder dislocated.  As of today, she is relatively asymptomatic aside from the bruising that is present.  No numbness or tingling.  Not taking any medication for pain.              ROS: All systems reviewed are negative as they relate to the chief complaint within the history of present illness.  Patient denies fevers or chills.  Assessment & Plan: Visit Diagnoses:  1. Acute pain of right shoulder     Plan: Patient is a 78 year old female who presents for evaluation of right shoulder discomfort.  She has history of reverse shoulder arthroplasty that was performed in  September 2024 with good results.  Was doing well until she noticed moderate pain and hard lump about a week ago with a lump resolving and now minimal discomfort but bruising that is present.  The bruising is somewhat impressive with the lack of symptoms that she is having currently.  No evidence of substantial subscap weakness today.  Radiographs of the right shoulder demonstrate no significant change compared with prior radiographs of the shoulder implant.  At this time, plan to allow patient to return to activity as tolerated and we will keep a close eye on this with follow-up in 4 weeks for clinical recheck with Dr. Addie.  Based on her description of sudden increase in pain with a hard lump, could have had an episode of instability that reduced on its own but patient does not really feel like this is the case and there is no reproducible subluxation or evidence of instability on exam today.  If she is having continued symptoms in 4 weeks or trending in the wrong direction, could consider CT scan of the right shoulder.  Follow-Up Instructions: No follow-ups on file.   Orders:  Orders Placed This Encounter  Procedures   XR Shoulder Right   No orders of the defined types were placed in this encounter.  Procedures: No procedures performed   Clinical Data: No additional findings.  Objective: Vital Signs: There were no vitals taken for this visit.  Physical Exam:  Constitutional: Patient appears well-developed HEENT:  Head: Normocephalic Eyes:EOM are normal Neck: Normal range of motion Cardiovascular: Normal rate Pulmonary/chest: Effort normal Neurologic: Patient is alert Skin: Skin is warm Psychiatric: Patient has normal mood and affect  Ortho Exam: Ortho exam demonstrates right shoulder with 50 degrees X rotation, 120 degrees abduction, 150 degrees forward elevation passively and actively.  She has axillary nerve intact with deltoid firing.  Incision looks to be well-healed.   There is mild to moderate ecchymosis noted throughout the proximal bicep region of the right arm.  Really no significant tenderness through the shoulder aside from some very minimal tenderness over the acromial process and minimal tenderness over the anterior humeral tray region.  She has intact EPL, FPL, finger abduction, pronation/supination, bicep, tricep, deltoid.  Really no discomfort with supination or bicep flexion strength testing.  She has no evidence of new Popeye deformity compared with the contralateral shoulder.  No pain with cervical spine range of motion.  No evidence of instability or subluxation with arm placed in adducted/internally rotated position.  Subscap strength rated 5 -/5.  Specialty Comments:  MRI LUMBAR SPINE WITHOUT CONTRAST   TECHNIQUE: Multiplanar, multisequence MR imaging of the lumbar spine was performed. No intravenous contrast was administered.   COMPARISON:  Radiographs December 02, 2021.   FINDINGS: Segmentation:  Standard.   Alignment: Dextroconvex scoliosis. Small anterolisthesis at L3-4 and L4-5. Small retrolisthesis at L1-2, L2-3 and L5-S1.   Vertebrae: No fracture, evidence of discitis, or bone lesion. Endplate degenerative changes with loss of disc height and Schmorl nodes throughout the lumbar spine with associated marrow edema at L3-4 and L4-5.   Conus medullaris and cauda equina: Conus extends to the L1 level. Conus and cauda equina appear normal.   Paraspinal and other soft tissues: Negative.   Disc levels:   T12-L1: Disc bulge and moderate facet degenerative changes without significant spinal canal or neural foraminal stenosis.   L1-2: Disc bulge, moderate facet degenerative changes and ligamentum flavum redundancy resulting in mild spinal canal stenosis with narrowing of the bilateral subarticular zones and mild bilateral foraminal.   L2-3: Disc bulge, moderate hypertrophic facet degenerative changes and ligamentum flavum redundancy  resulting in mild spinal canal stenosis, mild right and moderate left neural foraminal narrowing.   L3-4: Disc bulge, prominent hypertrophic facet degenerative changes with bilateral joint effusion and ligamentum flavum redundancy resulting in severe spinal canal stenosis, mild right and severe left neural foraminal.   L4-5: Disc bulge, prominent hypertrophic facet degenerative changes with bilateral joint effusion and ligamentum flavum redundancy resulting in severe spinal canal stenosis, mild right and moderate left neural foraminal narrowing.   L5-S1: Disc bulge, prominent hypertrophic facet degenerative changes with bilateral joint effusion and ligamentum flavum redundancy resulting in severe spinal canal stenosis and mild-to-moderate bilateral foraminal narrowing.   IMPRESSION: 1. 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 2. Multilevel neural foraminal narrowing severe on the left at L3-4 and moderate on the left at L2-3 and L4-5.     Electronically Signed   By: Katyucia  de Macedo Rodrigues M.D.   On: 01/21/2022 16:03  Imaging: No results found.   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   Rotator cuff tendonitis, right 07/02/2022   Nausea 12/02/2021   Bright red rectal bleeding 09/15/2021   History of colonic polyps 07/13/2021   Gastroenteritis 09/09/2020   Anemia 03/06/2020   Hypothyroidism 02/04/2020   Depression 01/24/2020   Asthma 01/24/2020   CKD (chronic kidney disease), stage III (HCC) 01/24/2020   Acute on chronic renal failure 01/24/2020   Constipation 12/20/2018   Pain in right hip 06/29/2017   Trochanteric bursitis, right hip 06/29/2017   History of total replacement of right hip 06/29/2017   Hemorrhoids 03/01/2017    Great toe pain, left 12/04/2016   Chronic right shoulder pain 12/04/2016   Non-toxic multinodular goiter 05/14/2013   Degenerative arthritis of hip 12/10/2011   HTN (hypertension) 07/06/2011   Hyperlipidemia 07/06/2011   Gastro-esophageal reflux disease without esophagitis 09/23/2009   Past Medical History:  Diagnosis Date   Arthritis    Asthma    Bronchitis 03/2016   Chronic kidney disease    stage 3   Colon polyps    tubular adenoma   Constipation    Depression    pt denies   Diverticulosis    GERD (gastroesophageal reflux disease)    Hemorrhoids    History of hiatal hernia    Hyperlipidemia    Hypertension    Hypothyroidism    Shortness of breath    with exertion on occasion   Sinusitis    Skin cancer     Family History  Problem Relation Age of Onset   Ovarian cancer Mother    Anxiety disorder Mother    Heart disease Paternal Grandmother    Other Paternal Aunt        brain tumor   Birth defects Sister        no iris   Depression Sister    Anxiety disorder Sister    Colon cancer Neg Hx     Past Surgical History:  Procedure Laterality Date   ABDOMINAL HYSTERECTOMY     BICEPT TENODESIS Right 04/26/2023   Procedure: RIGHT BICEPS TENODESIS;  Surgeon: Addie Cordella Hamilton, MD;  Location: [facility] OR;  Service: Orthopedics;  Laterality: Right;   CARPOMETACARPAL (CMC) FUSION OF THUMB Bilateral    CATARACT EXTRACTION, BILATERAL     CHOLECYSTECTOMY     COLONOSCOPY  02/12/2011   HAMMER TOE SURGERY Right    LAPAROSCOPIC APPENDECTOMY N/A 08/26/2021   Procedure: APPENDECTOMY LAPAROSCOPIC WITH LYSIS OF ADHESIONS;  Surgeon: Sheldon Standing, MD;  Location: [facility];  Service: General;  Laterality: N/A;   NASAL SINUS SURGERY     POLYPECTOMY     REVERSE SHOULDER ARTHROPLASTY Right 04/26/2023   Procedure: RIGHT REVERSE SHOULDER ARTHROPLASTY;  Surgeon: Addie Cordella Hamilton, MD;  Location: [facility] OR;  Service: Orthopedics;  Laterality: Right;   TOTAL HIP ARTHROPLASTY  12/10/2011   Procedure:  TOTAL HIP ARTHROPLASTY ANTERIOR APPROACH;  Surgeon: Lonni CINDERELLA Poli, MD;  Location: [facility];  Service: Orthopedics;  Laterality: Right;  Right Total Hip Arthroplasty, Anterior Approach   Social History   Occupational History   Occupation: retired    Associate Professor: RETIRED  Tobacco Use   Smoking status: Former    Current packs/day: 0.00    Types: Cigarettes    Quit date: 08/16/2001    Years since quitting: 22.8   Smokeless tobacco: Never  Vaping Use   Vaping status: Never Used  Substance and Sexual Activity   Alcohol  use: Not Currently   Drug use: No   Sexual activity:  Not on file

## 2024-06-18 ENCOUNTER — Encounter: Payer: Self-pay | Admitting: Radiology

## 2024-06-19 ENCOUNTER — Ambulatory Visit: Admitting: Physician Assistant

## 2024-06-27 ENCOUNTER — Other Ambulatory Visit: Payer: Self-pay | Admitting: Surgical

## 2024-07-02 DIAGNOSIS — M79674 Pain in right toe(s): Secondary | ICD-10-CM | POA: Diagnosis not present

## 2024-07-02 DIAGNOSIS — S90111A Contusion of right great toe without damage to nail, initial encounter: Secondary | ICD-10-CM | POA: Diagnosis not present

## 2024-07-02 DIAGNOSIS — W010XXA Fall on same level from slipping, tripping and stumbling without subsequent striking against object, initial encounter: Secondary | ICD-10-CM | POA: Diagnosis not present

## 2024-07-16 ENCOUNTER — Ambulatory Visit: Admitting: Orthopedic Surgery

## 2024-07-23 ENCOUNTER — Telehealth: Payer: Self-pay

## 2024-07-23 ENCOUNTER — Telehealth: Payer: Self-pay | Admitting: Orthopedic Surgery

## 2024-07-23 NOTE — Telephone Encounter (Signed)
 Pt called wanting a refill of Oxycodone  50 mg. Pharmacy is [facility]. Call back number is [phone].

## 2024-07-23 NOTE — Telephone Encounter (Signed)
 Hey, This patient is on Xu's schedule for tomorrow at 3pm.  This is a [employer] patient and will need to be rescheduled to see Addie or [employer]. Thanks-Ercie Eliasen G

## 2024-07-23 NOTE — Telephone Encounter (Signed)
 I called the pt and she said that she didn't want to schedule an appt at this time and would call back if she did.

## 2024-07-23 NOTE — Telephone Encounter (Signed)
 noted

## 2024-07-24 ENCOUNTER — Ambulatory Visit: Admitting: Orthopaedic Surgery
